# Patient Record
Sex: Female | Born: 1950 | Race: White | Hispanic: No | Marital: Married | Smoking: Never smoker
Health system: Southern US, Community
[De-identification: ages and names within clinical notes are randomized; demographics above are authoritative.]

## PROBLEM LIST (undated history)

## (undated) ENCOUNTER — Emergency Department (HOSPITAL_BASED_OUTPATIENT_CLINIC_OR_DEPARTMENT_OTHER): Admission: EM | Payer: PPO

## (undated) DIAGNOSIS — M1990 Unspecified osteoarthritis, unspecified site: Secondary | ICD-10-CM

## (undated) DIAGNOSIS — J45909 Unspecified asthma, uncomplicated: Secondary | ICD-10-CM

## (undated) DIAGNOSIS — E538 Deficiency of other specified B group vitamins: Secondary | ICD-10-CM

## (undated) DIAGNOSIS — I10 Essential (primary) hypertension: Secondary | ICD-10-CM

## (undated) DIAGNOSIS — K5730 Diverticulosis of large intestine without perforation or abscess without bleeding: Secondary | ICD-10-CM

## (undated) DIAGNOSIS — E785 Hyperlipidemia, unspecified: Secondary | ICD-10-CM

## (undated) DIAGNOSIS — E039 Hypothyroidism, unspecified: Secondary | ICD-10-CM

## (undated) HISTORY — PX: ABDOMINAL HYSTERECTOMY: SHX81

## (undated) HISTORY — DX: Deficiency of other specified B group vitamins: E53.8

## (undated) HISTORY — DX: Diverticulosis of large intestine without perforation or abscess without bleeding: K57.30

## (undated) HISTORY — DX: Unspecified osteoarthritis, unspecified site: M19.90

## (undated) HISTORY — DX: Unspecified asthma, uncomplicated: J45.909

## (undated) HISTORY — PX: OTHER SURGICAL HISTORY: SHX169

## (undated) HISTORY — PX: CARPAL TUNNEL RELEASE: SHX101

## (undated) HISTORY — DX: Hypothyroidism, unspecified: E03.9

## (undated) HISTORY — PX: BREAST SURGERY: SHX581

## (undated) HISTORY — DX: Hyperlipidemia, unspecified: E78.5

## (undated) HISTORY — PX: CERVICAL DISC ARTHROPLASTY: SHX587

## (undated) HISTORY — PX: TONSILLECTOMY: SUR1361

## (undated) HISTORY — DX: Essential (primary) hypertension: I10

## (undated) HISTORY — PX: REDUCTION MAMMAPLASTY: SUR839

---

## 1999-11-24 ENCOUNTER — Ambulatory Visit (HOSPITAL_COMMUNITY): Admission: RE | Admit: 1999-11-24 | Discharge: 1999-11-24 | Payer: Self-pay | Admitting: Gastroenterology

## 1999-12-15 ENCOUNTER — Encounter (INDEPENDENT_AMBULATORY_CARE_PROVIDER_SITE_OTHER): Payer: Self-pay | Admitting: Specialist

## 1999-12-15 ENCOUNTER — Ambulatory Visit (HOSPITAL_COMMUNITY): Admission: RE | Admit: 1999-12-15 | Discharge: 1999-12-15 | Payer: Self-pay | Admitting: *Deleted

## 2000-03-13 ENCOUNTER — Encounter: Admission: RE | Admit: 2000-03-13 | Discharge: 2000-03-13 | Payer: Self-pay | Admitting: Internal Medicine

## 2000-03-13 ENCOUNTER — Encounter: Payer: Self-pay | Admitting: Internal Medicine

## 2001-04-23 ENCOUNTER — Ambulatory Visit (HOSPITAL_COMMUNITY): Admission: RE | Admit: 2001-04-23 | Discharge: 2001-04-23 | Payer: Self-pay | Admitting: Orthopedic Surgery

## 2001-04-23 ENCOUNTER — Encounter: Payer: Self-pay | Admitting: Orthopedic Surgery

## 2001-07-30 ENCOUNTER — Encounter: Payer: Self-pay | Admitting: Internal Medicine

## 2001-07-30 ENCOUNTER — Encounter: Admission: RE | Admit: 2001-07-30 | Discharge: 2001-07-30 | Payer: Self-pay | Admitting: Internal Medicine

## 2003-09-26 ENCOUNTER — Ambulatory Visit (HOSPITAL_COMMUNITY): Admission: RE | Admit: 2003-09-26 | Discharge: 2003-09-26 | Payer: Self-pay | Admitting: Family Medicine

## 2004-12-06 ENCOUNTER — Ambulatory Visit: Payer: Self-pay | Admitting: Family Medicine

## 2004-12-14 ENCOUNTER — Ambulatory Visit: Payer: Self-pay | Admitting: Family Medicine

## 2004-12-20 ENCOUNTER — Ambulatory Visit: Payer: Self-pay | Admitting: Family Medicine

## 2005-01-03 ENCOUNTER — Ambulatory Visit: Payer: Self-pay | Admitting: Family Medicine

## 2005-03-28 ENCOUNTER — Ambulatory Visit: Payer: Self-pay | Admitting: Family Medicine

## 2005-04-25 ENCOUNTER — Ambulatory Visit: Payer: Self-pay | Admitting: Family Medicine

## 2005-04-29 ENCOUNTER — Encounter: Admission: RE | Admit: 2005-04-29 | Discharge: 2005-04-29 | Payer: Self-pay | Admitting: Family Medicine

## 2005-05-19 ENCOUNTER — Inpatient Hospital Stay (HOSPITAL_COMMUNITY): Admission: RE | Admit: 2005-05-19 | Discharge: 2005-05-20 | Payer: Self-pay | Admitting: Neurosurgery

## 2005-11-09 ENCOUNTER — Ambulatory Visit (HOSPITAL_COMMUNITY): Admission: RE | Admit: 2005-11-09 | Discharge: 2005-11-09 | Payer: Self-pay | Admitting: Family Medicine

## 2005-12-13 ENCOUNTER — Encounter: Admission: RE | Admit: 2005-12-13 | Discharge: 2006-01-26 | Payer: Self-pay | Admitting: Neurosurgery

## 2005-12-26 ENCOUNTER — Ambulatory Visit: Payer: Self-pay | Admitting: Family Medicine

## 2005-12-30 ENCOUNTER — Ambulatory Visit: Payer: Self-pay | Admitting: Family Medicine

## 2006-01-06 ENCOUNTER — Ambulatory Visit: Payer: Self-pay | Admitting: Internal Medicine

## 2006-01-26 ENCOUNTER — Ambulatory Visit: Payer: Self-pay | Admitting: Family Medicine

## 2006-01-31 ENCOUNTER — Ambulatory Visit: Payer: Self-pay | Admitting: Internal Medicine

## 2006-02-03 ENCOUNTER — Ambulatory Visit: Payer: Self-pay | Admitting: Family Medicine

## 2006-02-03 ENCOUNTER — Encounter: Admission: RE | Admit: 2006-02-03 | Discharge: 2006-02-03 | Payer: Self-pay | Admitting: Family Medicine

## 2006-02-21 ENCOUNTER — Ambulatory Visit: Payer: Self-pay | Admitting: Internal Medicine

## 2006-04-27 ENCOUNTER — Ambulatory Visit: Payer: Self-pay | Admitting: Family Medicine

## 2006-07-19 ENCOUNTER — Ambulatory Visit (HOSPITAL_COMMUNITY): Admission: RE | Admit: 2006-07-19 | Discharge: 2006-07-20 | Payer: Self-pay | Admitting: Orthopedic Surgery

## 2007-03-08 ENCOUNTER — Telehealth (INDEPENDENT_AMBULATORY_CARE_PROVIDER_SITE_OTHER): Payer: Self-pay | Admitting: *Deleted

## 2007-03-13 ENCOUNTER — Ambulatory Visit (HOSPITAL_COMMUNITY): Admission: RE | Admit: 2007-03-13 | Discharge: 2007-03-13 | Payer: Self-pay | Admitting: Family Medicine

## 2007-04-20 ENCOUNTER — Ambulatory Visit: Payer: Self-pay | Admitting: Family Medicine

## 2007-04-20 LAB — CONVERTED CEMR LAB
ALT: 20 units/L (ref 0–35)
AST: 19 units/L (ref 0–37)
Albumin: 4.1 g/dL (ref 3.5–5.2)
Alkaline Phosphatase: 136 units/L — ABNORMAL HIGH (ref 39–117)
BUN: 18 mg/dL (ref 6–23)
Basophils Absolute: 0 10*3/uL (ref 0.0–0.1)
Basophils Relative: 0.3 % (ref 0.0–1.0)
Bilirubin Urine: NEGATIVE
Bilirubin, Direct: 0.1 mg/dL (ref 0.0–0.3)
Blood in Urine, dipstick: NEGATIVE
CO2: 31 meq/L (ref 19–32)
Calcium: 9.6 mg/dL (ref 8.4–10.5)
Chloride: 105 meq/L (ref 96–112)
Cholesterol: 232 mg/dL (ref 0–200)
Creatinine, Ser: 0.7 mg/dL (ref 0.4–1.2)
Direct LDL: 144 mg/dL
Eosinophils Absolute: 0.1 10*3/uL (ref 0.0–0.6)
Eosinophils Relative: 2 % (ref 0.0–5.0)
GFR calc Af Amer: 111 mL/min
GFR calc non Af Amer: 91 mL/min
Glucose, Bld: 94 mg/dL (ref 70–99)
Glucose, Urine, Semiquant: NEGATIVE
HCT: 37.9 % (ref 36.0–46.0)
HDL: 66 mg/dL (ref >39.0)
Hemoglobin: 12.7 g/dL (ref 12.0–15.0)
Lymphocytes Relative: 38.3 % (ref 12.0–46.0)
MCHC: 33.6 g/dL (ref 30.0–36.0)
MCV: 85.3 fL (ref 78.0–100.0)
Monocytes Absolute: 0.4 10*3/uL (ref 0.2–0.7)
Monocytes Relative: 6.5 % (ref 3.0–11.0)
Neutro Abs: 2.9 10*3/uL (ref 1.4–7.7)
Neutrophils Relative %: 52.9 % (ref 43.0–77.0)
Nitrite: NEGATIVE
Platelets: 294 10*3/uL (ref 150–400)
Potassium: 4.2 meq/L (ref 3.5–5.1)
Protein, U semiquant: NEGATIVE
RBC: 4.44 M/uL (ref 3.87–5.11)
RDW: 12.5 % (ref 11.5–14.6)
Sodium: 142 meq/L (ref 135–145)
Specific Gravity, Urine: >=1.03
TSH: 0.12 microintl units/mL — ABNORMAL LOW (ref 0.35–5.50)
Total Bilirubin: 0.7 mg/dL (ref 0.3–1.2)
Total CHOL/HDL Ratio: 3.5
Total Protein: 6.7 g/dL (ref 6.0–8.3)
Triglycerides: 171 mg/dL — ABNORMAL HIGH (ref 0–149)
Urobilinogen, UA: 0.2
VLDL: 34 mg/dL (ref 0–40)
WBC Urine, dipstick: NEGATIVE
WBC: 5.5 10*3/uL (ref 4.5–10.5)
pH: 5

## 2007-05-09 ENCOUNTER — Ambulatory Visit: Payer: Self-pay | Admitting: Family Medicine

## 2007-05-09 DIAGNOSIS — F988 Other specified behavioral and emotional disorders with onset usually occurring in childhood and adolescence: Secondary | ICD-10-CM | POA: Insufficient documentation

## 2007-05-09 DIAGNOSIS — I872 Venous insufficiency (chronic) (peripheral): Secondary | ICD-10-CM | POA: Insufficient documentation

## 2007-05-09 DIAGNOSIS — E039 Hypothyroidism, unspecified: Secondary | ICD-10-CM | POA: Insufficient documentation

## 2007-05-09 DIAGNOSIS — F909 Attention-deficit hyperactivity disorder, unspecified type: Secondary | ICD-10-CM | POA: Insufficient documentation

## 2007-05-09 HISTORY — DX: Attention-deficit hyperactivity disorder, unspecified type: F90.9

## 2007-05-10 ENCOUNTER — Telehealth (INDEPENDENT_AMBULATORY_CARE_PROVIDER_SITE_OTHER): Payer: Self-pay | Admitting: *Deleted

## 2007-05-22 ENCOUNTER — Ambulatory Visit: Payer: Self-pay | Admitting: Family Medicine

## 2007-06-15 ENCOUNTER — Encounter: Payer: Self-pay | Admitting: Family Medicine

## 2007-07-23 ENCOUNTER — Ambulatory Visit: Payer: Self-pay | Admitting: Family Medicine

## 2007-07-23 LAB — CONVERTED CEMR LAB: TSH: 8.12 microintl units/mL — ABNORMAL HIGH (ref 0.35–5.50)

## 2007-07-26 ENCOUNTER — Telehealth: Payer: Self-pay | Admitting: Family Medicine

## 2007-08-15 ENCOUNTER — Telehealth: Payer: Self-pay | Admitting: Family Medicine

## 2007-11-01 ENCOUNTER — Telehealth (INDEPENDENT_AMBULATORY_CARE_PROVIDER_SITE_OTHER): Payer: Self-pay | Admitting: *Deleted

## 2007-11-16 ENCOUNTER — Telehealth: Payer: Self-pay | Admitting: Family Medicine

## 2008-01-09 ENCOUNTER — Telehealth: Payer: Self-pay | Admitting: Family Medicine

## 2008-04-01 ENCOUNTER — Telehealth: Payer: Self-pay | Admitting: *Deleted

## 2008-04-22 ENCOUNTER — Ambulatory Visit: Payer: Self-pay | Admitting: Family Medicine

## 2008-07-21 ENCOUNTER — Telehealth: Payer: Self-pay | Admitting: *Deleted

## 2008-08-11 ENCOUNTER — Ambulatory Visit: Payer: Self-pay | Admitting: Family Medicine

## 2008-08-11 LAB — CONVERTED CEMR LAB
ALT: 21 units/L (ref 0–35)
AST: 23 units/L (ref 0–37)
Albumin: 4 g/dL (ref 3.5–5.2)
Alkaline Phosphatase: 121 units/L — ABNORMAL HIGH (ref 39–117)
BUN: 21 mg/dL (ref 6–23)
Basophils Absolute: 0 10*3/uL (ref 0.0–0.1)
Basophils Relative: 0 % (ref 0.0–3.0)
Bilirubin Urine: NEGATIVE
Bilirubin, Direct: 0.1 mg/dL (ref 0.0–0.3)
Blood in Urine, dipstick: NEGATIVE
CO2: 31 meq/L (ref 19–32)
Calcium: 9.6 mg/dL (ref 8.4–10.5)
Chloride: 107 meq/L (ref 96–112)
Cholesterol: 247 mg/dL (ref 0–200)
Creatinine, Ser: 0.7 mg/dL (ref 0.4–1.2)
Direct LDL: 168.6 mg/dL
Eosinophils Absolute: 0.1 10*3/uL (ref 0.0–0.7)
Eosinophils Relative: 2.4 % (ref 0.0–5.0)
GFR calc Af Amer: 110 mL/min
GFR calc non Af Amer: 91 mL/min
Glucose, Bld: 101 mg/dL — ABNORMAL HIGH (ref 70–99)
Glucose, Urine, Semiquant: NEGATIVE
HCT: 39.3 % (ref 36.0–46.0)
HDL: 55.6 mg/dL (ref >39.0)
Hemoglobin: 13.5 g/dL (ref 12.0–15.0)
Ketones, urine, test strip: NEGATIVE
Lymphocytes Relative: 36.2 % (ref 12.0–46.0)
MCHC: 34.3 g/dL (ref 30.0–36.0)
MCV: 80.5 fL (ref 78.0–100.0)
Monocytes Absolute: 0.3 10*3/uL (ref 0.1–1.0)
Monocytes Relative: 6.1 % (ref 3.0–12.0)
Neutro Abs: 2.4 10*3/uL (ref 1.4–7.7)
Neutrophils Relative %: 55.3 % (ref 43.0–77.0)
Nitrite: NEGATIVE
Platelets: 253 10*3/uL (ref 150–400)
Potassium: 4.5 meq/L (ref 3.5–5.1)
RBC: 4.89 M/uL (ref 3.87–5.11)
RDW: 13.9 % (ref 11.5–14.6)
Sodium: 143 meq/L (ref 135–145)
Specific Gravity, Urine: 1.02
TSH: 0.81 microintl units/mL (ref 0.35–5.50)
Total Bilirubin: 0.9 mg/dL (ref 0.3–1.2)
Total CHOL/HDL Ratio: 4.4
Total Protein: 7.2 g/dL (ref 6.0–8.3)
Triglycerides: 147 mg/dL (ref 0–149)
Urobilinogen, UA: 0.2
VLDL: 29 mg/dL (ref 0–40)
WBC Urine, dipstick: NEGATIVE
WBC: 4.4 10*3/uL — ABNORMAL LOW (ref 4.5–10.5)
pH: 5.5

## 2008-08-18 ENCOUNTER — Ambulatory Visit: Payer: Self-pay | Admitting: Family Medicine

## 2008-08-18 ENCOUNTER — Other Ambulatory Visit: Admission: RE | Admit: 2008-08-18 | Discharge: 2008-08-18 | Payer: Self-pay | Admitting: Family Medicine

## 2008-08-18 ENCOUNTER — Encounter: Payer: Self-pay | Admitting: Family Medicine

## 2008-08-18 DIAGNOSIS — N952 Postmenopausal atrophic vaginitis: Secondary | ICD-10-CM | POA: Insufficient documentation

## 2008-08-18 HISTORY — DX: Postmenopausal atrophic vaginitis: N95.2

## 2008-10-22 ENCOUNTER — Telehealth: Payer: Self-pay | Admitting: Family Medicine

## 2008-10-29 ENCOUNTER — Telehealth: Payer: Self-pay | Admitting: Family Medicine

## 2009-02-09 ENCOUNTER — Telehealth: Payer: Self-pay | Admitting: Family Medicine

## 2009-05-11 ENCOUNTER — Telehealth: Payer: Self-pay | Admitting: *Deleted

## 2009-08-21 ENCOUNTER — Telehealth: Payer: Self-pay | Admitting: Family Medicine

## 2009-10-26 ENCOUNTER — Telehealth: Payer: Self-pay | Admitting: Family Medicine

## 2009-11-30 ENCOUNTER — Telehealth: Payer: Self-pay | Admitting: Family Medicine

## 2009-12-29 ENCOUNTER — Ambulatory Visit: Payer: Self-pay | Admitting: Family Medicine

## 2009-12-29 LAB — CONVERTED CEMR LAB
ALT: 23 units/L (ref 0–35)
AST: 24 units/L (ref 0–37)
Albumin: 4.3 g/dL (ref 3.5–5.2)
Alkaline Phosphatase: 96 units/L (ref 39–117)
BUN: 23 mg/dL (ref 6–23)
Basophils Absolute: 0 10*3/uL (ref 0.0–0.1)
Basophils Relative: 0.7 % (ref 0.0–3.0)
Bilirubin Urine: NEGATIVE
Bilirubin, Direct: 0.1 mg/dL (ref 0.0–0.3)
Blood in Urine, dipstick: NEGATIVE
CO2: 32 meq/L (ref 19–32)
Calcium: 9.6 mg/dL (ref 8.4–10.5)
Chloride: 103 meq/L (ref 96–112)
Cholesterol: 263 mg/dL — ABNORMAL HIGH (ref 0–200)
Creatinine, Ser: 0.7 mg/dL (ref 0.4–1.2)
Direct LDL: 174.2 mg/dL
Eosinophils Absolute: 0.1 10*3/uL (ref 0.0–0.7)
Eosinophils Relative: 1.4 % (ref 0.0–5.0)
GFR calc non Af Amer: 90.35 mL/min (ref >60)
Glucose, Bld: 104 mg/dL — ABNORMAL HIGH (ref 70–99)
Glucose, Urine, Semiquant: NEGATIVE
HCT: 40.4 % (ref 36.0–46.0)
HDL: 68.9 mg/dL (ref >39.00)
Hemoglobin: 14 g/dL (ref 12.0–15.0)
Ketones, urine, test strip: NEGATIVE
Lymphocytes Relative: 29 % (ref 12.0–46.0)
Lymphs Abs: 1.9 10*3/uL (ref 0.7–4.0)
MCHC: 34.7 g/dL (ref 30.0–36.0)
MCV: 87.4 fL (ref 78.0–100.0)
Monocytes Absolute: 0.3 10*3/uL (ref 0.1–1.0)
Monocytes Relative: 4.2 % (ref 3.0–12.0)
Neutro Abs: 4.2 10*3/uL (ref 1.4–7.7)
Neutrophils Relative %: 64.7 % (ref 43.0–77.0)
Nitrite: NEGATIVE
Platelets: 212 10*3/uL (ref 150.0–400.0)
Potassium: 4.9 meq/L (ref 3.5–5.1)
Protein, U semiquant: NEGATIVE
RBC: 4.63 M/uL (ref 3.87–5.11)
RDW: 14.3 % (ref 11.5–14.6)
Sodium: 143 meq/L (ref 135–145)
Specific Gravity, Urine: 1.025
TSH: 0.63 microintl units/mL (ref 0.35–5.50)
Total Bilirubin: 0.6 mg/dL (ref 0.3–1.2)
Total CHOL/HDL Ratio: 4
Total Protein: 7 g/dL (ref 6.0–8.3)
Triglycerides: 219 mg/dL — ABNORMAL HIGH (ref 0.0–149.0)
Urobilinogen, UA: 0.2
VLDL: 43.8 mg/dL — ABNORMAL HIGH (ref 0.0–40.0)
WBC Urine, dipstick: NEGATIVE
WBC: 6.6 10*3/uL (ref 4.5–10.5)
pH: 6

## 2010-01-07 ENCOUNTER — Ambulatory Visit: Payer: Self-pay | Admitting: Family Medicine

## 2010-02-04 ENCOUNTER — Ambulatory Visit (HOSPITAL_COMMUNITY): Admission: RE | Admit: 2010-02-04 | Discharge: 2010-02-04 | Payer: Self-pay | Admitting: Family Medicine

## 2010-02-05 ENCOUNTER — Ambulatory Visit: Payer: Self-pay | Admitting: Family Medicine

## 2010-02-05 DIAGNOSIS — I10 Essential (primary) hypertension: Secondary | ICD-10-CM

## 2010-02-09 ENCOUNTER — Encounter: Payer: Self-pay | Admitting: Family Medicine

## 2010-02-26 ENCOUNTER — Telehealth: Payer: Self-pay | Admitting: Family Medicine

## 2010-05-03 ENCOUNTER — Telehealth: Payer: Self-pay | Admitting: Family Medicine

## 2010-05-05 ENCOUNTER — Telehealth: Payer: Self-pay | Admitting: Family Medicine

## 2010-06-07 ENCOUNTER — Telehealth: Payer: Self-pay | Admitting: Family Medicine

## 2010-07-26 ENCOUNTER — Ambulatory Visit
Admission: RE | Admit: 2010-07-26 | Discharge: 2010-07-26 | Payer: Self-pay | Source: Home / Self Care | Attending: Family Medicine | Admitting: Family Medicine

## 2010-07-26 DIAGNOSIS — M5030 Other cervical disc degeneration, unspecified cervical region: Secondary | ICD-10-CM | POA: Insufficient documentation

## 2010-07-28 ENCOUNTER — Telehealth: Payer: Self-pay | Admitting: *Deleted

## 2010-07-29 ENCOUNTER — Telehealth: Payer: Self-pay | Admitting: Family Medicine

## 2010-08-17 NOTE — Progress Notes (Signed)
Summary: Rx Refill  Phone Note Refill Request Call back at [phone] Message from:  Patient on June 07, 2010 8:28 AM  Refills Requested: Medication #1:  ADDERALL 20 MG TABS take one tab by mouth once daily Pt states she needs urgently before [facility]   Method Requested: Pick up at Office Initial call taken by: Trixie Dredge,  June 07, 2010 8:28 AM    Prescriptions: AMPHETAMINE SALT COMBO 20 MG TABS (AMPHETAMINE-DEXTROAMPHETAMINE) take one tablet once daily   fill in two months  #30 x 0   Entered by:   Kern Reap CMA (AAMA)   Authorized by:   Roderick Pee MD   Signed by:   Kern Reap CMA (AAMA) on 06/07/2010   Method used:   Print then Give to Patient   RxID:   [id number] AMPHETAMINE SALT COMBO 20 MG TABS (AMPHETAMINE-DEXTROAMPHETAMINE) take one tablet once daily   fill in one month  #30 x 0   Entered by:   Kern Reap CMA (AAMA)   Authorized by:   Roderick Pee MD   Signed by:   Kern Reap CMA (AAMA) on 06/07/2010   Method used:   Print then Give to Patient   RxID:   [id number] AMPHETAMINE SALT COMBO 20 MG TABS (AMPHETAMINE-DEXTROAMPHETAMINE) take one table once daily  #30 x 0   Entered by:   Kern Reap CMA (AAMA)   Authorized by:   Roderick Pee MD   Signed by:   Kern Reap CMA (AAMA) on 06/07/2010   Method used:   Print then Give to Patient   RxID:   [id number]

## 2010-08-17 NOTE — Progress Notes (Signed)
Summary: REQUEST FOR RX  Phone Note Call from Patient   Caller: Patient   Summary of Call: Pt would like a Rx for med:  Premarin.... Pt adv that her sister was put on med by Dr Tawanna Cooler because of sxs of menopause (hot flashes) and since she has been exp the same thing, she would like to have Rx  -  PLEASE !......... [facility] [address] [place].  Initial call taken by: Debbra Riding,  June 07, 2010 2:09 PM  Follow-up for Phone Call        Premarin .3, dispense 100 tablets....... directions one p.o. nightly, no refills.  Follow-up office visit in two months.  Ladona Ridgel, also, she has to take an 81 mg, baby aspirin daily because of the side effects of potential blood clots from hormone Follow-up by: Roderick Pee MD,  June 07, 2010 2:33 PM  Additional Follow-up for Phone Call Additional follow up Details #1::        Phone Call Completed Additional Follow-up by: Kern Reap CMA Duncan Dull),  June 07, 2010 4:14 PM    New/Updated Medications: PREMARIN 0.3 MG TABS (ESTROGENS CONJUGATED) one tab by mouth at bedtime Prescriptions: PREMARIN 0.3 MG TABS (ESTROGENS CONJUGATED) one tab by mouth at bedtime  #100 x 0   Entered by:   Kern Reap CMA (AAMA)   Authorized by:   Roderick Pee MD   Signed by:   Kern Reap CMA (AAMA) on 06/07/2010   Method used:   Electronically to        [facility] [phone]* (retail)       [address]       [place], [place]  [address]       Ph: [phone]       Fax: [phone]   RxID:   [id number]

## 2010-08-17 NOTE — Progress Notes (Signed)
Summary: [facility]der[facility] is on [facility] order. Req to use 12.5mg    Phone Note From Pharm[facility] at [facility] [phone] - Marcelino Duster   [facility] Summary of Call: [facility] and said that [facility] Adderall is on back order til end of Nov. Pharmacy has some 12.5mg  generic and is wondering if it would be ok to give pt 1 and 1/2 daily, which is 18.75 mg, for this month. Pls call.  Initial call taken by: Lucy Antigua,  May 05, 2010 11:36 AM  Follow-up for Phone Call        ok Follow-up by: Roderick Pee MD,  May 06, 2010 8:15 AM  Additional Follow-up for Phone Call Additional follow up Details #1::        rx for 12.5 discarded. only the 7.5 available.  patient is aware. Additional Follow-up by: Kern Reap CMA Duncan Dull),  May 06, 2010 10:38 AM    New/Updated Medications: ADDERALL 12.5 MG TABS (AMPHETAMINE-DEXTROAMPHETAMINE) take one and half tab by mouth once daily Prescriptions: ADDERALL 12.5 MG TABS (AMPHETAMINE-DEXTROAMPHETAMINE) take one and half tab by mouth once daily  #45 x 0   Entered by:   Kern Reap CMA (AAMA)   Authorized by:   Roderick Pee MD   Signed by:   Kern Reap CMA (AAMA) on 05/06/2010   Method used:   Print then Give to Patient   RxID:   [id number]

## 2010-08-17 NOTE — Progress Notes (Signed)
Summary: REQ FOR REFILL (Amphetamine Salt Combo - Adderall)  Phone Note Refill Request Message from:  Patient on Nov 30, 2009 4:24 PM  Refills Requested: Medication #1:  AMPHETAMINE SALT COMBO 20 MG TABS take one table once daily   Notes: Pt adv that she can be reached at home # [phone] or cell # [phone] when Rx is ready for p/u.    Initial call taken by: Debbra Riding,  Nov 30, 2009 4:24 PM  Follow-up for Phone Call        ok Follow-up by: Roderick Pee MD,  Nov 30, 2009 4:39 PM  Additional Follow-up for Phone Call Additional follow up Details #1::        rx ready for pick up Additional Follow-up by: Kern Reap CMA Duncan Dull),  Dec 01, 2009 10:13 AM    Prescriptions: AMPHETAMINE SALT COMBO 20 MG TABS (AMPHETAMINE-DEXTROAMPHETAMINE) take one tablet once daily   fill in two months  #30 x 0   Entered by:   Kern Reap CMA (AAMA)   Authorized by:   Roderick Pee MD   Signed by:   Kern Reap CMA (AAMA) on 11/30/2009   Method used:   Print then Give to Patient   RxID:   [id number] AMPHETAMINE SALT COMBO 20 MG TABS (AMPHETAMINE-DEXTROAMPHETAMINE) take one tablet once daily   fill in one month  #30 x 0   Entered by:   Kern Reap CMA (AAMA)   Authorized by:   Roderick Pee MD   Signed by:   Kern Reap CMA (AAMA) on 11/30/2009   Method used:   Print then Give to Patient   RxID:   [id number] AMPHETAMINE SALT COMBO 20 MG TABS (AMPHETAMINE-DEXTROAMPHETAMINE) take one table once daily  #30 x 0   Entered by:   Kern Reap CMA (AAMA)   Authorized by:   Roderick Pee MD   Signed by:   Kern Reap CMA (AAMA) on 11/30/2009   Method used:   Print then Give to Patient   RxID:   [id number] AMPHETAMINE SALT COMBO 20 MG TABS (AMPHETAMINE-DEXTROAMPHETAMINE) take one tablet once daily   fill in two months  #30 x 0   Entered by:   Kern Reap CMA (AAMA)   Authorized by:   Roderick Pee MD   Signed by:   Kern Reap CMA (AAMA) on 11/30/2009  Method used:   Electronically to        [facility]. [phone]* (retail)       [address]       [place], [place]  [address]       Ph: [phone]       Fax: [phone]   RxID:   [id number] AMPHETAMINE SALT COMBO 20 MG TABS (AMPHETAMINE-DEXTROAMPHETAMINE) take one tablet once daily   fill in one month  #30 x 0   Entered by:   Kern Reap CMA (AAMA)   Authorized by:   Roderick Pee MD   Signed by:   Kern Reap CMA (AAMA) on 11/30/2009   Method used:   Electronically to        [facility]. [phone]* (retail)       [address]       [place], [place]  [address]       Ph: [phone]       Fax: [phone]   RxID:  [id number] AMPHETAMINE SALT COMBO 20 MG TABS (AMPHETAMINE-DEXTROAMPHETAMINE) take one table once daily  #30 x 0   Entered by:   Kern Reap CMA (AAMA)   Authorized by:   Roderick Pee MD   Signed by:   Kern Reap CMA (AAMA) on 11/30/2009   Method used:   Electronically to        [facility]. [phone]* (retail)       [address]       [place], [place]  [address]       Ph: [phone]       Fax: [phone]   RxID:   [id number]

## 2010-08-17 NOTE — Progress Notes (Signed)
Summary: refill  Phone Note Refill Request Call back at Home Phone [phone] Message from:  Patient---live call  Refills Requested: Medication #1:  HYDROCHLOROTHIAZIDE 12.5 MG  CAPS 1 once daily [facility]  Initial call taken by: Warnell Forester,  October 26, 2009 2:00 PM    Prescriptions: HYDROCHLOROTHIAZIDE 12.5 MG  CAPS (HYDROCHLOROTHIAZIDE) 1 once daily  #100 x 4   Entered by:   Kern Reap CMA (AAMA)   Authorized by:   Roderick Pee MD   Signed by:   Kern Reap CMA (AAMA) on 10/26/2009   Method used:   Electronically to        [facility]. [phone]* (retail)       [address]       [place], [place]  [address]       Ph: [phone]       Fax: [phone]   RxID:   [id number]

## 2010-08-17 NOTE — Miscellaneous (Signed)
Summary: rx update  Clinical Lists Changes  Medications: Changed medication from SYNTHROID 100 MCG  TABS (LEVOTHYROXINE SODIUM) once daily to SYNTHROID 100 MCG  TABS (LEVOTHYROXINE SODIUM) once daily  Appended Document: Flu Vaccine update    Clinical Lists Changes  Observations: Added new observation of FLU VAX: Historical (04/19/2010 10:51)       Immunization History:  Influenza Immunization History:    Influenza:  historical (04/19/2010)

## 2010-08-17 NOTE — Assessment & Plan Note (Signed)
Summary: cpx---pap//ccm   Vital Signs:  Patient profile:   60 year old female Menstrual status:  hysterectomy Height:      59 inches Weight:      122 pounds BMI:     24.73 Temp:     98.5 degrees F oral BP sitting:   158 / 100  (left arm) Cuff size:   regular  Vitals Entered By: Kern Reap CMA Duncan Dull) (January 07, 2010 10:48 AM) CC: cpx Is Patient Diabetic? No     Menstrual Status hysterectomy Last PAP Result NEGATIVE FOR INTRAEPITHELIAL LESIONS OR MALIGNANCY.   CC:  cpx.  History of Present Illness: Gabrielle Vargas is a 60 year old female, nonsmoker, who comes in for general physical examination because of underlying hypertension, hypothyroidism, ADD, and postmenopausal vaginal dryness.  She takes hydrochlorothiazide 12.5 mg daily for hypertension.  BP today 158 over hundred.  She states her blood pressures at home are running 140/90.  However, sometimes she gets normal pressure.  She takes Synthroid 100 micrograms alternating with 125 micrograms TSH normal.  Continue that medication.  She takes Adderall 20 mg daily for ADD functioning well on medication.  She has a history of postmenopausal vaginal dryness.  However, she is not using the cream that we prescribed.  She takes an 81-mg baby aspirin, walks 30 minutes daily, however, she does not do BSE monthly.  Her last mammogram was two years ago.  She does get routine eye care and dental care.  Colonoscopy normal.  Tetanus 10-Dec-2004 seasonal flu 2008/12/10.  Her mother died of metastatic melanoma.  We will also do a complete skin exam.  She is also complaining of some soreness in her joints.  No redness nor swelling.  Allergies: 1)  ! * Latex 2)  Percocet  Past History:  Past medical, surgical, family and social histories (including risk factors) reviewed, and no changes noted (except as noted below).  Past Medical History: Reviewed history from 08/18/2008 and no changes required. childbirth x 2 [place] for  DUB ADD hypothyroidism venous insufficiency reduction mammoplasty...implanta ...imp. remioved  Past Surgical History: Reviewed history from 08/18/2008 and no changes required. Rotator cuff repair  R  Family History: Reviewed history from 08/18/2008 and no changes required. Family History of Alcoholism/Addiction Family History of CAD Female 1st degree relative <60 Family History Depression Family History Hypertension mother died of metastatic melanoma  Social History: Reviewed history from 05/09/2007 and no changes required. 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 No apparent alopecia or balding. Eyes:  No corneal or conjunctival inflammation noted. EOMI. Perrla. Funduscopic exam benign, without hemorrhages, exudates or papilledema. Vision grossly normal. Ears:  External ear exam shows no significant lesions or deformities.  Otoscopic examination reveals clear canals, tympanic membranes are intact bilaterally without bulging, retraction, inflammation or discharge. Hearing is grossly normal bilaterally. Nose:  External nasal examination shows no deformity or inflammation. Nasal mucosa are pink and moist without lesions or exudates. Mouth:  Oral mucosa and oropharynx without lesions or exudates.  Teeth in good repair. Neck:  No deformities, masses, or tenderness noted. Chest Wall:  No deformities, masses, or tenderness noted. Breasts:  scars on her breasts.  She had any implants and then the implants removed and  then she got them tightened up.  No palpable masses Lungs:  Normal respiratory effort, chest expands symmetrically. Lungs are  clear to auscultation, no crackles or wheezes. Heart:  Normal rate and regular rhythm. S1 and S2 normal without gallop, murmur, click, rub or other extra sounds. Abdomen:  Bowel sounds positive,abdomen soft and non-tender without masses, organomegaly or hernias noted. Rectal:  No external abnormalities noted. Normal sphincter tone. No rectal masses or tenderness. Genitalia:  Pelvic Exam:        External: normal female genitalia without lesions or masses        Vagina: normal without lesions or masses        Cervix: normal without lesions or masses        Adnexa: normal bimanual exam without masses or fullness        Uterus: normal by palpation        Pap smear: not performed Msk:  No deformity or scoliosis noted of thoracic or lumbar spine.   Pulses:  R and L carotid,radial,femoral,dorsalis pedis and posterior tibial pulses are full and equal bilaterally Extremities:  No clubbing, cyanosis, edema, or deformity noted with normal full range of motion of all joints.   Neurologic:  No cranial nerve deficits noted. Station and gait are normal. Plantar reflexes are down-going bilaterally. DTRs are symmetrical throughout. Sensory, motor and coordinative functions appear intact. Skin:  Intact without suspicious lesions or rashes Cervical Nodes:  No lymphadenopathy noted Axillary Nodes:  No palpable lymphadenopathy Inguinal Nodes:  No significant adenopathy Psych:  Cognition and judgment appear intact. Alert and cooperative with normal attention span and concentration. No apparent delusions, illusions, hallucinations   Impression & Recommendations:  Problem # 1:  Preventive Health Care (ICD-V70.0) Assessment Unchanged  Problem # 2:  VAGINITIS, ATROPHIC, SEVERE (ICD-627.3) Assessment: Deteriorated  Her updated medication list for this problem includes:    Premarin 0.625 Mg/gm Crea (Estrogens, conjugated) .Marland Kitchen... Apply weekly  Orders: Prescription Created Electronically [phone])  Problem # 3:   ADD (ICD-314.00) Assessment: Improved  Orders: Prescription Created Electronically [phone])  Problem # 4:  VENOUS INSUFFICIENCY, MILD (ICD-459.81) Assessment: Improved  Orders: Prescription Created Electronically [phone])  Problem # 5:  ELEVATED BLOOD PRESSURE WITHOUT DIAGNOSIS OF HYPERTENSION (ICD-796.2) Assessment: New  Her updated medication list for this problem includes:    Hydrochlorothiazide 12.5 Mg Caps (Hydrochlorothiazide) .Marland Kitchen... 1 once daily  Orders: Prescription Created Electronically [phone]) EKG w/ Interpretation ([address])  Complete Medication List: 1)  Hydrochlorothiazide 12.5 Mg Caps (Hydrochlorothiazide) .Marland Kitchen.. 1 once daily 2)  Synthroid 100 Mcg Tabs (Levothyroxine sodium) .... Once daily 3)  Synthroid 125 Mcg Tabs (Levothyroxine sodium) .... Once daily 4)  Amphetamine Salt Combo 20 Mg Tabs (Amphetamine-dextroamphetamine) .... Take one table once daily 5)  Amphetamine Salt Combo 20 Mg Tabs (Amphetamine-dextroamphetamine) .... Take one tablet once daily   fill in one month 6)  Amphetamine Salt Combo 20 Mg Tabs (Amphetamine-dextroamphetamine) .... Take one tablet once daily   fill in two months 7)  Adult Aspirin Ec Low Strength 81 Mg Tbec (Aspirin) .... Once daily 8)  Premarin 0.625 Mg/gm Crea (Estrogens, conjugated) .... Apply weekly  Patient Instructions: 1)  check your blood pressure daily in the morning.  Return in 3 weeks for follow-up.  When u  returned during a record of all your blood pressure readings and the device 2)  Schedule your mammogram./BSE monthly 3)  Take calcium +Vitamin D daily. 4)  Take an Aspirin every day...Marland KitchenMarland Kitchen81 mg  5)  use the vaginal cream  twice weekly Prescriptions: PREMARIN 0.625 MG/GM CREA (ESTROGENS, CONJUGATED) apply weekly  #3 tubes x 6   Entered and Authorized by:   Roderick Pee MD   Signed by:   Roderick Pee MD on 01/07/2010   Method used:   Electronically to        [facility] [phone]* (retail)       [address]       [place], [place]  [address]       Ph: [phone]       Fax: [phone]   RxID:   [id number] SYNTHROID 125 MCG  TABS (LEVOTHYROXINE SODIUM) once daily  #100 x 4   Entered and Authorized by:   Roderick Pee MD   Signed by:   Roderick Pee MD on 01/07/2010   Method used:   Electronically to        [facility]. [phone]* (retail)       [address]       [place], [place]  [address]       Ph: [phone]       Fax: [phone]   RxID:   [id number] SYNTHROID 100 MCG  TABS (LEVOTHYROXINE SODIUM) once daily  #100 x 4   Entered and Authorized by:   Roderick Pee MD   Signed by:   Roderick Pee MD on 01/07/2010   Method used:   Electronically to        [facility]. [phone]* (retail)       [address]       [place], [place]  [address]       Ph: [phone]       Fax: [phone]   RxID:   [id number] HYDROCHLOROTHIAZIDE 12.5 MG  CAPS (HYDROCHLOROTHIAZIDE) 1 once daily  #100 x 4   Entered and Authorized by:   Roderick Pee MD   Signed by:   Roderick Pee MD on 01/07/2010   Method used:   Electronically to        [facility]. [phone]* (retail)       [address]       [place], [place]  [address]       Ph: [phone]       Fax: [phone]   RxID:   [id number]    Immunization History:  Influenza Immunization History:    Influenza:  historical (04/17/2009)

## 2010-08-17 NOTE — Progress Notes (Signed)
Summary: RX FOR GENERIC ADDERALL   Phone Note Refill Request Call back at [phone] Message from:  Patient on February 26, 2010 1:30 PM  Refills Requested: Medication #1:  AMPHETAMINE SALT COMBO 20 MG TABS take one table once daily   Dosage confirmed as above?Dosage Confirmed  Medication #2:  AMPHETAMINE SALT COMBO 20 MG TABS take one tablet once daily   fill in one month   Dosage confirmed as above?Dosage Confirmed  Medication #3:  AMPHETAMINE SALT COMBO 20 MG TABS take one tablet once daily   fill in two months   Dosage confirmed as above?Dosage Confirmed Initial call taken by: Kathrynn Speed CMA,  February 26, 2010 1:31 PM Caller: Patient Call For: Roderick Pee MD Summary of Call: PT NEEDS NEW RX FOR GENERIC ADDERALL 20  MG  Last fill date was 11/30/09 per our system  Generic on Med list ok to print for you to sign?   Initial call taken by: Heron Sabins,  February 26, 2010 9:31 AM  Follow-up for Phone Call        0k x 64mo.  rx ready to pick up.  patient is aware Follow-up by: Roderick Pee MD,  February 26, 2010 10:51 AM    Prescriptions: AMPHETAMINE SALT COMBO 20 MG TABS (AMPHETAMINE-DEXTROAMPHETAMINE) take one tablet once daily   fill in two months  #30 x 0   Entered by:   Kern Reap CMA (AAMA)   Authorized by:   Roderick Pee MD   Signed by:   Kern Reap CMA (AAMA) on 02/26/2010   Method used:   Print then Give to Patient   RxID:   [id number] AMPHETAMINE SALT COMBO 20 MG TABS (AMPHETAMINE-DEXTROAMPHETAMINE) take one tablet once daily   fill in one month  #30 x 0   Entered by:   Kern Reap CMA (AAMA)   Authorized by:   Roderick Pee MD   Signed by:   Kern Reap CMA (AAMA) on 02/26/2010   Method used:   Print then Give to Patient   RxID:   [id number] AMPHETAMINE SALT COMBO 20 MG TABS (AMPHETAMINE-DEXTROAMPHETAMINE) take one table once daily  #30 x 0   Entered by:   Kern Reap CMA (AAMA)   Authorized by:   Roderick Pee MD   Signed by:    Kern Reap CMA (AAMA) on 02/26/2010   Method used:   Print then Give to Patient   RxID:   [id number]

## 2010-08-17 NOTE — Progress Notes (Signed)
Summary: Pt said generic adderall 20mg  not available. Pt req brand name  Phone Note Call from Patient Call back at [phone] cell   Summary of Call: Pt called and said that generic Adderall 20mg  is not available at any pharmacy until after the first of the yr. Pt is req brand name Adderall until then. Pls call and advise.   Initial call taken by: Lucy Antigua,  May 03, 2010 9:10 AM    New/Updated Medications: ADDERALL 20 MG TABS (AMPHETAMINE-DEXTROAMPHETAMINE) take one tab by mouth once daily Prescriptions: ADDERALL 20 MG TABS (AMPHETAMINE-DEXTROAMPHETAMINE) take one tab by mouth once daily  #30 x 0   Entered by:   Kern Reap CMA (AAMA)   Authorized by:   Roderick Pee MD   Signed by:   Kern Reap CMA (AAMA) on 05/03/2010   Method used:   Print then Give to Patient   RxID:   [id number]

## 2010-08-17 NOTE — Assessment & Plan Note (Signed)
Summary: 3 wk rov/njr/pt rescd//ccm   Vital Signs:  Patient profile:   60 year old female Menstrual status:  hysterectomy Temp:     98.4 degrees F oral BP sitting:   142 / 98  (left arm) Cuff size:   regular  Vitals Entered By: Kern Reap CMA Duncan Dull) (February 05, 2010 9:14 AM)  History of Present Illness: Gabrielle Vargas is a 60 year old female, nonsmoker, who comes in today for reevaluation of hypertension.  She is on hydrochlorothiazide 12.5 mg daily BP running 140/80.  Today's BP 160/90 right arm sitting position.  She says she has chronic neck pain.  She had cervical disk surgery by Dr. Venetia Maxon 5 years ago and, now she's having pain in her neck.  Advised to go back and see Dr. Venetia Maxon, ASAP  Allergies: 1)  ! * Latex 2)  Percocet  Past History:  Past medical, surgical, family and social histories (including risk factors) reviewed for relevance to current acute and chronic problems.  Past Medical History: Reviewed history from 08/18/2008 and no changes required. childbirth x 2 T.ah for DUB ADD hypothyroidism venous insufficiency reduction mammoplasty...implanta ...imp. remioved  Past Surgical History: Reviewed history from 08/18/2008 and no changes required. Rotator cuff repair  R  Family History: Reviewed history from 08/18/2008 and no changes required. Family History of Alcoholism/Addiction Family History of CAD Female 1st degree relative <60 Family History Depression Family History Hypertension mother died of metastatic melanoma  Social History: Reviewed history from 05/09/2007 and no changes required. 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 1 once daily Her updated medication list for this problem includes:    Hydrochlorothiazide 25 Mg Tabs (Hydrochlorothiazide) .Marland Kitchen... Take 1 tablet by mouth every morning  Orders: Prescription Created Electronically [phone])  Complete Medication List: 1)  Synthroid 100 Mcg Tabs (Levothyroxine sodium) .... Once daily 2)  Synthroid 125 Mcg Tabs (Levothyroxine sodium) .... Once daily 3)  Amphetamine Salt Combo 20 Mg Tabs (Amphetamine-dextroamphetamine) .... Take one table once daily 4)  Amphetamine Salt Combo 20 Mg Tabs (Amphetamine-dextroamphetamine) .... Take one tablet once daily   fill in one month 5)  Amphetamine Salt Combo 20 Mg Tabs (Amphetamine-dextroamphetamine) .... Take one tablet once daily   fill in two months 6)  Adult Aspirin Ec Low Strength 81 Mg Tbec (Aspirin) .... Once daily 7)  Premarin 0.625 Mg/gm Crea (Estrogens, conjugated) .... Apply weekly 8)  Hydrochlorothiazide 25 Mg Tabs (Hydrochlorothiazide) .... Take 1 tablet by mouth every morning  Patient Instructions: 1)  increase the hydrochlorothiazide to 25 mg daily in the morning.  Check a morning blood pressure daily x 3 weeks.  If it the end of 3 weeks y  blood pressure is normal.........  That is the systolic is 135 or less and the diastolic is less than 85 or less........... than continue the hydrochlorothiazide 25 mg daily.  If, however,  your blood pressure is not normal...............make an appointment, so we can change y  medication Prescriptions:  SYNTHROID 125 MCG  TABS (LEVOTHYROXINE SODIUM) once daily  #100 x 4   Entered and Authorized by:   Roderick Pee MD   Signed by:   Roderick Pee MD on 02/05/2010   Method  used:   Print then Give to Patient   RxID:   [id number] SYNTHROID 100 MCG  TABS (LEVOTHYROXINE SODIUM) once daily  #100 x 4   Entered and Authorized by:   Roderick Pee MD   Signed by:   Roderick Pee MD on 02/05/2010   Method used:   Print then Give to Patient   RxID:   [id number] HYDROCHLOROTHIAZIDE 25 MG TABS (HYDROCHLOROTHIAZIDE) Take 1 tablet by mouth every morning  #100 x 3   Entered and Authorized by:   Roderick Pee MD   Signed by:   Roderick Pee MD on 02/05/2010   Method used:   Electronically to        [facility]. [phone]* (retail)       [address]       [place], [place]  [address]       Ph: [phone]       Fax: [phone]   RxID:   [id number]

## 2010-08-17 NOTE — Progress Notes (Signed)
Summary: NEW RX  Phone Note Call from Patient Call back at Home Phone [phone]   Caller: Patient Call For: Roderick Pee MD Summary of Call: PT NEEDS GENERIC ADDERALL 20 MG FOR 3 MONTH Initial call taken by: Heron Sabins,  August 21, 2009 3:14 PM    Prescriptions: AMPHETAMINE SALT COMBO 20 MG TABS (AMPHETAMINE-DEXTROAMPHETAMINE) take one tablet once daily   fill in two months  #30 x 0   Entered by:   Kern Reap CMA (AAMA)   Authorized by:   Roderick Pee MD   Signed by:   Kern Reap CMA (AAMA) on 08/21/2009   Method used:   Print then Give to Patient   RxID:   [id number] AMPHETAMINE SALT COMBO 20 MG TABS (AMPHETAMINE-DEXTROAMPHETAMINE) take one tablet once daily   fill in one month  #30 x 0   Entered by:   Kern Reap CMA (AAMA)   Authorized by:   Roderick Pee MD   Signed by:   Kern Reap CMA (AAMA) on 08/21/2009   Method used:   Print then Give to Patient   RxID:   [id number] AMPHETAMINE SALT COMBO 20 MG TABS (AMPHETAMINE-DEXTROAMPHETAMINE) take one table once daily  #30 x 0   Entered by:   Kern Reap CMA (AAMA)   Authorized by:   Roderick Pee MD   Signed by:   Kern Reap CMA (AAMA) on 08/21/2009   Method used:   Print then Give to Patient   RxID:   [id number]

## 2010-08-19 NOTE — Assessment & Plan Note (Signed)
Summary: Tick bite/cb   Vital Signs:  Patient profile:   60 year old female Menstrual status:  hysterectomy Weight:      123 pounds Temp:     98.4 degrees F oral BP sitting:   140 / 96  (left arm) Cuff size:   regular  Vitals Entered By: Kern Reap CMA Duncan Dull) (July 26, 2010 9:37 AM) CC: tick bite  Is Patient Diabetic? Yes Pain Assessment Patient in pain? yes     Location: neck Type: shooting Onset of pain  Constant   CC:  tick bite .  History of Present Illness: Gabrielle Vargas is a 60 year old, married female, nonsmoker, who comes in today for evaluation of 3 problems.  She has a history of underlying ADD and is currently on Adderall 20 mg daily however, the medication is in short supply she would like to discuss her options.  She's also having more trouble with her neck for the past couple months.  Five years ago.  She had cervical disk surgery by Dr. Venetia Maxon.  Now her pain is coming back.  Advised to get his back and see Dr. Venetia Maxon for further evaluation.  She also had a tick bite in her left groin 3 weeks ago.  No fever, etc.  Allergies: 1)  ! * Latex 2)  Percocet  Past History:  Past medical, surgical, family and social histories (including risk factors) reviewed for relevance to current acute and chronic problems.  Past Medical History: Reviewed history from 08/18/2008 and no changes required. childbirth x 2 T.ah for DUB ADD hypothyroidism venous insufficiency reduction mammoplasty...implanta ...imp. remioved  Past Surgical History: Reviewed history from 08/18/2008 and no changes required. Rotator cuff repair  R  Family History: Reviewed history from 08/18/2008 and no changes required. Family History of Alcoholism/Addiction Family History of CAD Female 1st degree relative <60 Family History Depression Family History Hypertension mother died of metastatic melanoma  Social History: Reviewed history from 05/09/2007 and no changes required. 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  No adenopathy Psych:  Cognition and judgment appear intact. Alert and cooperative with normal attention span and concentration. No apparent delusions, illusions, hallucinations   Impression & Recommendations:  Problem # 1:  ADD (ICD-314.00) Assessment Improved  Problem # 2:  DISC DISEASE, CERVICAL (ICD-722.4) Assessment: Deteriorated  Problem # 3:  TICK BITE (ICD-E906.4) Assessment: New  Complete Medication List: 1)  Synthroid 100 Mcg Tabs (Levothyroxine sodium) .... Once daily 2)  Synthroid 125 Mcg Tabs (Levothyroxine sodium) .... Once daily 3)  Amphetamine Salt Combo 20 Mg Tabs (Amphetamine-dextroamphetamine) .... Take one table once daily 4)  Amphetamine Salt Combo 20 Mg Tabs (Amphetamine-dextroamphetamine) .... Take one tablet once daily   fill in one month 5)  Amphetamine Salt Combo 20 Mg Tabs (Amphetamine-dextroamphetamine) .... Take one tablet once daily   fill in two months 6)  Adult Aspirin Ec Low Strength 81 Mg Tbec (Aspirin) .... Once daily 7)  Premarin 0.625 Mg/gm Crea (Estrogens, conjugated) .... Apply weekly 8)  Hydrochlorothiazide 25 Mg Tabs (Hydrochlorothiazide) .... Take 1 tablet by mouth every morning 9)  Adderall 20 Mg Tabs (Amphetamine-dextroamphetamine) .... Take one tab by mouth once daily 10)  Adderall 12.5 Mg  Tabs (Amphetamine-dextroamphetamine) .... Take one and half tab by mouth once daily 11)  Premarin 0.3 Mg Tabs (Estrogens conjugated) .... One tab by mouth at bedtime  Patient Instructions: 1)  called Drs. Venetia Maxon in a neurosurgeon ASAP to begin an evaluation. 2)  Hold the Adderall and see how you  feel.  If, however, he feel like you need to restart it.  Then I will write to new prescription. Prescriptions: AMPHETAMINE SALT COMBO 20 MG TABS (AMPHETAMINE-DEXTROAMPHETAMINE) take one tablet once daily   fill in two months  #30 x 0   Entered and Authorized by:   Roderick Pee MD   Signed by:   Roderick Pee MD on 07/26/2010   Method used:   Print then Give to Patient   RxID:   [id number] AMPHETAMINE SALT COMBO 20 MG TABS (AMPHETAMINE-DEXTROAMPHETAMINE) take one tablet once daily   fill in one month  #30 x 0   Entered and Authorized by:   Roderick Pee MD   Signed by:   Roderick Pee MD on 07/26/2010   Method used:   Print then Give to Patient   RxID:   [id number] AMPHETAMINE SALT COMBO 20 MG TABS (AMPHETAMINE-DEXTROAMPHETAMINE) take one table once daily  #30 x 0   Entered and Authorized by:   Roderick Pee MD   Signed by:   Roderick Pee MD on 07/26/2010   Method used:   Print then Give to Patient   RxID:   [id number] AMPHETAMINE SALT COMBO 20 MG TABS (AMPHETAMINE-DEXTROAMPHETAMINE) take one tablet once daily   fill in two months  #30 x 0   Entered and Authorized by:   Roderick Pee MD   Signed by:   Roderick Pee MD on 07/26/2010   Method used:   Electronically to        [facility]. [phone]* (retail)       [address]       [place], [place]  [address]       Ph: [phone]       Fax: [phone]   RxID:   [id number] AMPHETAMINE SALT COMBO 20 MG TABS (AMPHETAMINE-DEXTROAMPHETAMINE) take one tablet once daily   fill in one month  #30 x 0   Entered and Authorized by:   Roderick Pee MD   Signed by:   Roderick Pee MD on 07/26/2010   Method used:   Electronically to        [facility] [phone]* (retail)       [address]       [place], [place]  [address]       Ph: [phone]       Fax: [phone]   RxID:   [id number] AMPHETAMINE SALT COMBO 20 MG TABS  (AMPHETAMINE-DEXTROAMPHETAMINE) take one table once daily  #30 x 0   Entered and Authorized by:   Roderick Pee MD   Signed by:   Roderick Pee MD on 07/26/2010   Method used:   Electronically to        [facility] [phone]* (retail)       [address]       [place], [place]  [address]       Ph: [phone]       Fax: [phone]   RxID:  [id number]    Orders Added: 1)  Est. Patient Level III [696[address]]

## 2010-08-19 NOTE — Progress Notes (Signed)
Summary: Pt needs to get order to have MRI done re: neck pain  Phone Note Call from Patient Call back at Home Phone [phone]   Caller: Patient Summary of Call: Pt needs order to have an MRI done for neck pain.  Pt said that Dr Venetia Maxon could not see pt until 08/16/10 and recommended that Dr Tawanna Cooler order the MRI and then have results faxed to Dr. Rush Farmer office. Pt is trying to get in sooner to see Dr Rush Farmer.  Initial call taken by: Lucy Antigua,  July 28, 2010 1:37 PM  Follow-up for Phone Call        Pt called re: getting MRI ordered. Pt req call back today, re: this matter.  Follow-up by: Lucy Antigua,  July 29, 2010 10:21 AM  Additional Follow-up for Phone Call Additional follow up Details #1::        since she is seeing the neurosurgeon, the insurance company wants her neurosurgeon to order the study Additional Follow-up by: Roderick Pee MD,  July 29, 2010 11:20 AM    Additional Follow-up for Phone Call Additional follow up Details #2::    Pt aware of this. Follow-up by: Romualdo Bolk, CMA (AAMA),  July 29, 2010 11:37 AM

## 2010-08-19 NOTE — Progress Notes (Signed)
Summary: Pt called Ins and wants [place] to order MRI  Call back at Home Phone [phone]   Caller: Patient Summary of Call: Pt called back saying that she discussed this with her Ins. They told her that we could order the MRI but that we would have to get a prior auth. The number is [phone]. Pt really wants this done prior to seeing the Neursurgeon. Pt is very anxious about this. Initial call taken by: Romualdo Bolk, CMA Duncan Dull),  July 29, 2010 12:07 PM  Follow-up for Phone Call       Follow-up by: Roderick Pee MD,  July 29, 2010 5:49 PM

## 2010-09-09 ENCOUNTER — Other Ambulatory Visit: Payer: Self-pay | Admitting: Family Medicine

## 2010-09-09 DIAGNOSIS — Z780 Asymptomatic menopausal state: Secondary | ICD-10-CM

## 2010-09-09 MED ORDER — ESTROGENS, CONJUGATED 0.625 MG/GM VA CREA: TOPICAL_CREAM | Freq: Every day | VAGINAL | Status: DC

## 2010-09-09 NOTE — Telephone Encounter (Signed)
Pt called and is at [facility] at [employer] and market. Pt had req refill of Premarin .3mg  tabs. Pt said that when she went to pharmacy to pick script, the cream was prescribed. Pls call in the pill form of med asap.  Pt waiting at pharmacy.

## 2010-12-03 NOTE — Op Note (Signed)
NAMEMELIDA, [place]          ACCOUNT NO.:  [id number]   MEDICAL RECORD NO.:  [id number]          PATIENT TYPE:  AMB   LOCATION:  DAY                          FACILITY:  [facility]   PHYSICIAN:  Georges Lynch. Gioffre, M.D.DATE OF BIRTH:  [birth year]   DATE OF PROCEDURE:  07/19/2006  DATE OF DISCHARGE:                               OPERATIVE REPORT   SURGEON:  Georges Lynch. Darrelyn Hillock, M.D.   ASSISTANT:  Jamelle Rushing, P.A.   PREOPERATIVE DIAGNOSES:  1. Torn rotator cuff tendon right shoulder.  2. Severe impingement syndrome right shoulder.   POSTOPERATIVE DIAGNOSES:  1. Torn rotator cuff tendon right shoulder.  2. Severe impingement syndrome right shoulder.   OPERATION:  1. Open acromionectomy right shoulder.  2. Repair of a small tear rotator cuff tendon right shoulder utilizing      restore tendon graft with two Mitek sutures.   PROCEDURE:  The patient first had a interscalene nerve block on the  right.  Following that, she was taken back to surgery and administered a  general anesthetic.  Sterile prep and draping of the right shoulder was  carried out, after she had 1 gram of IV Ancef.  At this time, a small  incision was made over the anterior aspect of the right shoulder.  Bleeders identified and cauterized.  The incision was carried down to  the acromion.  I then dissected the deltoid tendon from the acromion,  exposed the acromion and then inserted a Bennett retractor, and did an  acromionectomy, and utilized the bur to even out the undersurface of the  acromion.  I then bone waxed the undersurface of the acromion.  She had  severe impingement syndrome of the right shoulder.  Her rotator cuff had  a small tear and it was markedly thinned out.  Because of the nature the  tendon, I elected to go ahead and repair the tendon utilizing a restore  tendon graft with two Mitek sutures.  We thoroughly irrigated out the  area, reapproximated deltoid tendon and muscle in the usual  fashion.  Subcu was closed with Vicryl, skin with metal staples.  Sterile  neosporin dressing was applied.  The patient was then placed in a  shoulder immobilizer.           ______________________________  Georges Lynch Darrelyn Hillock, M.D.     RAG/MEDQ  D:  07/19/2006  T:  07/19/2006  Job:  [id number]

## 2010-12-03 NOTE — Assessment & Plan Note (Signed)
[facility]                               PULMONARY OFFICE NOTE   NAME:Vargas, Gabrielle                 MRN:          [id number]  DATE:01/31/2006                            DOB:          1950-11-07    PROBLEM:  A 60 year old nonsmoker seen at the kind request of Dr. Tawanna Cooler in  pulmonary consultation because of persistent cough and discomfort  swallowing.   HISTORY:  She complains of cough that has been most pronounced since the  middle of May of 2007.  She actually associates this with neck surgery for  anterior fixation of cervical degenerative disk disease by a left anterior  neck approach in November of 2007.  She had some cough apparently during the  winter, but did not really begin focusing on the pattern until it would not  go away.  She was tentatively diagnosed with asthma, having no prior history  of asthma.  She tried a prednisone taper and cough syrup.  While she was  visiting in [place] this spring she was seen by a physician there and  given a trial of Advair 250 and then has tried Qvar 40, but felt neither of  these helped.  She has had a tussive right lateral rib pain aggravated by  deep breathing or stretching which she says feels like a rib fracture.  In  the last few weeks she had had some cough productive of green sputum with a  low-grade temperature up to a maximum around 100.3.  She was given 10 days  of Biaxin which helped while she was on it.  The cough has returned with  green sputum when she quit.  Cough is intense enough that it wakes her some  from sleep and is aggravated by laughing, yawning, sneezing, eating, and  drinking.   REVIEW OF SYSTEMS:  Some sense of heaviness or discomfort through the middle  chest without dyspnea while up and walking.  She notices some difficulty  swallowing certain textures of food, primarily hard foods such as carrots.  If she extends her neck she feels a streak of pain down the  posterior left  neck into the left shoulder.  Depending on the position of her head while  lying down or standing she has a sensation like a furball in her throat  which causes cough.  Occasional tussive right lateral rib pains became  persistent.  They improved, but have not gone away and are distinctly  aggravated by deep breath, twisting, or bending.  She is not aware of any  sort of sinus discomfort.  She denies any awareness of reflux upward but  says if she swallows the feeling is not quite right in her throat and she  thinks her gag reflex may be reduced.  She is told that she snores and may  have some apnea events, apparently not a lot.   PAST HISTORY:  1.  C5-6 fixation surgery for cervical spine degenerative disease in      November of 2006.  2.  Hypertension.  3.  Mild seasonal asthma.  4.  Elevated cholesterol.  5.  Kidney stone.  6.  Hysterectomy.  7.  Breast surgery in 1989 for benign disease.  8.  Hypothyroidism.  9.  Bilateral carpal tunnel surgery since spring of 2007.  10. 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   MEDICATIONS:  1.  Synthroid 0.125 mg.  2.  HCTZ 12.5 mg.  3.  Premarin 0.3 mg.  4.  Aspirin 81 mg.  5.  Vitamin D.  6.  Qvar 40 two puffs daily.   Drug intolerance to LATEX.   SOCIAL HISTORY:  Married.  Never smoked.  Has twin sister.  Works as a  Armed forces operational officer.   FAMILY HISTORY:  Mother was a smoker with COPD.  Father had heart disease.  Brothers with cancer.  Nobody known to have history of asthma.   OBJECTIVE:  VITAL SIGNS:  Weight 134 pounds, blood pressure 156/98, pulse  regular, 80, room air saturation 97%.  GENERAL:  Well-developed, well-nourished woman.  No evident distress.  SKIN:  No rash.  ADENOPATHY:  None found.  HEENT:  Healed left anterior vertical neck incision.  There is no strider.  Voice quality is normal.  Nasal airway is unobstructed with minimal septal  deviation.  Posterior pharynx may be slightly glandular, but  not  erythematous.  Tongue base is unremarkable.  Neck veins are not distended.  I do not feel enlargement of thyroid.  Some throat clearing is noted.  CHEST:  Very minimal chest congestion.  No wheeze, rhonchi, or increased  work of breathing.  HEART:  Sounds regular without murmur or gallop.  EXTREMITIES:  No clubbing, cyanosis, edema, or tremor.   Chest x-ray:  She brings a film from [facility] dated January 16, 2006 which  shows prominent bronchovascular markings consistent with a bronchitis, but  no effusion or obvious adenopathy or distinct acute process.  Cervical spine  surgical changes are noted.   IMPRESSION:  1.  Postoperative changes in her neck with placement of an anterior fixation      device may be causing some of her symptoms by posterior to anterior      pressure on the posterior esophagus and trachea.  2.  Cyclical cough, likely including a component of reflux with bronchitis.      She may be refluxing considerably more than she realizes.  3.  Possible sleep apnea based on history.  Will be addressed later.   PLAN:  1.  Augmentin 875 mg b.i.d. x10 days.  2.  Okay to remain off of Advair and Qvar for now.  3.  Try sample Singulair 10 mg daily for two weeks.  4.  Try over-the-counter Prilosec b.i.d. for one month before breakfast and      supper.  5.  Return three to four weeks at which time we will consider blood work,      modified barium swallow.   I appreciate the chance to meet her.                                   Gabrielle D. Maple Hudson, MD, FCCP, FACP   CDY/MedQ  DD:  01/31/2006  DT:  02/01/2006  Job #:  [id number]   cc:   Tinnie Gens A. Tawanna Cooler, MD

## 2010-12-03 NOTE — Op Note (Signed)
[place]  Patient:    Gabrielle Vargas, Gabrielle Vargas                 MRN: [id number] Proc. Date: 12/15/99 Adm. Date:  [id number] Disc. Date: [id number] Attending:  Kandis Mannan CC:         Heywood Iles, M.D.             [place] Daphine Deutscher, M.D.             James L. Randa Evens, M.D.                           Operative Report  CCS NUMBER:  [id number]  PREOPERATIVE DIAGNOSES: 1. Probable posterior fissure in ano. 2. Anal polyp.  POSTOPERATIVE DIAGNOSES: 1. Probable posterior fissure in ano. 2. Anal polyp.  OPERATION:  Anoscopy with excision of anal polyp.  SURGEON:  Maisie Fus B. Samuella Cota, M.D.  ANESTHESIA:  General.  ANESTHESIOLOGIST:  Burna Forts, M.D. and CRNA.  DESCRIPTION OF PROCEDURE:  The patient was taken to the operating room and placed on the table in the supine position.  After satisfactory general anesthetic with intubation, the patient was placed in the lithotomy position. The perirectal area was prepped and draped in a sterile field.  The patient was dilated to two fingerbreadths and the sphincter muscle was somewhat tight. The large anoscope was then inserted and the patient had no significant hemorrhoids.  There was no evidence of an anterior fissure.  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  There were no significant hemorrhoids.  She did have an anal polyp at about the 7 oclock position.  The polyp was clamped across the base and amputated.  The base was then suture ligated with 2-0 chromic catgut.  It was sutured twice to get good hemostasis.  One suture was a tie and the other was a running suture.  It was elected not to do an internal sphincterotomy.  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  She did have some slight redundant tissue at the 6 oclock position at the skin edge, and this was excised and cauterized.  Bleeding was controlled.  The patient  seemed to tolerate the procedure well and was taken to the PACU in satisfactory condition. DD:  12/15/99 TD:  12/19/99 Job: [id number]

## 2010-12-03 NOTE — Op Note (Signed)
[name], ARCH NO.:  [id number]   MEDICAL RECORD NO.:  [id number]          PATIENT TYPE:  INP   LOCATION:  2899                         FACILITY:  [facility]   PHYSICIAN:  Danae Orleans. Venetia Maxon, M.D.  DATE OF BIRTH:  1950/07/22   DATE OF PROCEDURE:  05/19/2005  DATE OF DISCHARGE:                                 OPERATIVE REPORT   PREOPERATIVE DIAGNOSIS:  Herniated cervical disc with cervical spondylosis,  degenerative disc disease, and radiculopathy, C5-C6 and C6-C7 levels.   POSTOPERATIVE DIAGNOSIS:  Herniated cervical disc with cervical spondylosis,  degenerative disc disease, and radiculopathy, C5-C6 and C6-C7 levels.   PROCEDURE:  Anterior cervical decompression and fusion C5-C6 and C6-C7  levels with PEEK interbody cages, morselized bone autograft, and anterior  cervical plate.   SURGEON:  Danae Orleans. Venetia Maxon, M.D.   ASSISTANT:  Stefani Dama, M.D.  Susie L. Burns, R.N.   ANESTHESIA:  General endotracheal anesthesia.   ESTIMATED BLOOD LOSS:  Minimal.   COMPLICATIONS:  None.   DISPOSITION:  Recovery room.   INDICATIONS FOR PROCEDURE:  Gabrielle Vargas is a 60 year old woman with  significant left upper extremity pain with significant cervical spondylosis  and degenerative disc disease with a herniated disc at C5-C6 and C6-C7  levels.  It was elected to take her to surgery for anterior cervical  decompression and fusion at C5-C6 and C6-C7 levels.   PROCEDURE:  Gabrielle Vargas was brought to the operating room.  Following  satisfactory uncomplicated induction of general endotracheal anesthesia and  placement of intravenous  lines, the patient was placed in a supine position  on the operating table.  Her neck was placed in slight extension.  She was  placed in 5 pounds of halter traction.  Her anterior neck was then prepped  and draped in the usual sterile fashion.  The area of planned incision was  infiltrated with 0.25% Marcaine and 0.5% lidocaine with  1:200,000  epinephrine.  An incision was made from the midline to the anterior border  of the sternocleidomastoid muscle on the left side of the midline and  carried sharply through the platysmal layer.  Subplatysmal dissection was  performed exposing the anterior border of the sternocleidomastoid muscle.  Using blunt dissection, the carotid sheath was kept lateral, trachea and  esophagus kept medial, exposing the anterior cervical spine.  A bent spinal  needle was placed at what was felt to be the C5-C6 level and this was  confirmed on interoperative x-ray.  Subsequently, the longus colli muscles  were taken down from the anterior cervical spine from the C5 to C7  bilaterally using electrocautery and Key elevator.  A self-retaining  Shadowline retractor along with up/down retractor was used to facilitate  exposure.  Ventral osteophytes were removed with Leksell rongeur and bone  was retained for later use in bone grafting.  Subsequently, the disc spaces  at C5-C6 and C6-C7 were incised with a 15 blade.  Both discs were highly  degenerated.  Disc spaces were cleared with a variety of Carlens curets.  Disc space spreaders were placed to further facilitate evacuation of the  interspace.  Initially, at the C5-C6 level, the microscope was brought onto  the field.  Using microscopic visualization and a high speed drill, the  endplates of C5 and C6  were decorticated and osteophytes drilled down.  The  posterior longitudinal ligament was incised with the knife and removed in  piecemeal fashion with good resulting decompression of the spinal canal and  both C6 nerve roots. Hemostasis was assured with Gelfoam soaked in thrombin.  After trial sizing, a 6 mm PEEK interbody cage was selected, packed with  morselized autograft, inserted in the interspace, and counter sunk  appropriately.  Attention was turned to the C6-C7 level where similar  decompression was performed.  At this level, there was a  significant amount  of spondylitic material compressing both C7 nerve roots, worse on the left  than the right.  These were both decompressed as was the central spinal cord  dura.  Again, hemostasis was assured and a similarly sized PEEK interbody  cage was selected, packed with morselized bone autograft, inserted into the  interspace and counter sunk appropriately.  Subsequently, a 31 mm Alphatec  anterior cervical plate was affixed to the anterior cervical spine using 12  mm variable angle screws, two at C5, two at C6, two at C7.  All screws had  excellent purchase.  Locking mechanisms were engaged after final radiograph  showed well positioned interbody graft and anterior cervical plate.  Head  halter traction was removed prior to placing the plate.  Subsequently, the  soft tissues were inspected and found to be in good repair.  Hemostasis was  assured.  The platysmal layer was closed with 3-0 Vicryl sutures and the  skin edges were reapproximated with interrupted 3-0 Vicryl subcuticular  stitch.  The wound was dressed with Dermabond.  The patient was extubated in  the operating room and taken to the recovery room in stable condition having  tolerated the operation well.  Counts were correct at the end of the case.      Danae Orleans. Venetia Maxon, M.D.  Electronically Signed     JDS/MEDQ  D:  05/19/2005  T:  05/20/2005  Job:  [id number]

## 2010-12-03 NOTE — Procedures (Signed)
[facility]  Patient:    Gabrielle Vargas, Gabrielle Vargas                 MRN: [id number] Proc. Date: 11/24/99 Adm. Date:  [id number] Attending:  Orland Mustard CC:         Abigail Miyamoto, M.D.             Darius Bump, M.D.                           Procedure Report  PROCEDURE:  Colonoscopy.  ENDOSCOPIST:  Llana Aliment. Edwards, M.D.  MEDICATIONS:  Fentanyl 75 mcg and Versed 7 mg IV.  SCOPE:  Olympus adult video colonoscope.  INDICATION:  Possible polyp on rectal exam.  DESCRIPTION OF PROCEDURE:  Procedure had been explained to patient and consent obtained.  With the patient in the left lateral decubitus position, the Olympus adult video colonoscope was inserted and advanced under direct visualization.  The prep was excellent.  We were able to advance to the cecum with minimal difficulty.  The right lower quadrant was transilluminated and ileocecal valve seen and entered for a short distance.  Scope withdrawn. Cecum, ascending colon, hepatic flexure, transverse colon, splenic flexure, descending and sigmoid colon seen well upon removal; no significant diverticular disease and no polyps.  Scope was withdrawn in the rectum and a forward view of the rectum was normal.  In the retroflexed view, patient was seen to have internal hemorrhoids and what appeared to be a large 1.0- to 1.5-cm anal polyp that was sitting right on the internal hemorrhoids.  This could have been a thrombosed hemorrhoid or true polyp.  Due to the proximity to the hemorrhoids and the external sphincter, I elected not to attempt to remove this.  This was documented photographically and the scope removed. There was a small healing anal fissure.  ASSESSMENT: 1. Anal polyp too close to the anal canal to remove endoscopically. 2. Internal hemorrhoids. 3. Possible anal fissure, appears to be nearly healed.  PLAN:  Will give the patient instructions about hemorrhoids, keeping her  stool soft, etc.  Will have her see Dr. Abigail Miyamoto back in the office to see about removal of this anal polyp. DD:  11/24/99 TD:  11/25/99 Job: [id number]

## 2010-12-03 NOTE — Assessment & Plan Note (Signed)
[facility]                               PULMONARY OFFICE NOTE   NAME:MCCLINTOCKDanely, Gabrielle                 MRN:          [id number]  DATE:02/21/2006                            DOB:          03-23-1951    PROBLEM:  Cough.   HISTORY:  She returns now saying she feels slowly but distinctly better,  having completed a 10-day course of Augmentin and two weeks of Singulair  samples while continuing Prilosec OTC b.i.d.  She still notices a little  cough but says it is much less, no longer disruptive.  Rare tussive  incontinence.  Some episodic throat level tightness, mostly if she laughs or  with the first bite of food.  It does not sound as if she is aspirating or  refluxing actively that she recognizes.  In the morning, she still has some  cough with white phlegm and feels a little congested in the chest.   OBJECTIVE:  GENERAL APPEARANCE:  She looks quite well.  VITAL SIGNS:  Weight 126 pounds, blood pressure 142/88, pulse regular 90,  saturation on room air 96%.  HEENT:  May be minimal huskiness in the voice.  Her throat might be slightly  redder than normal but I see no adenomatous change.  No stridor.  No visible  post nasal drainage.  NECK:  There is no cervical adenopathy or thyromegaly.  Her left cervical  incision is well healed.  LUNGS:  Clear with good air flow.  No wheeze or cough demonstrated now.  CARDIOVASCULAR:  Heart sounds are regular without murmur or gallop.   IMPRESSION:  Gradually improving pharyngitis or tracheitis with  laryngospasm.  I cannot tell if she is refluxing a little and it remains  unclear whether there is a relationship to the mechanical effects of her  cervical fusion.  We talked two options and decided the best approach would  be to give her time and see if this will go on and resolve completely.  We  did decide to let her try two more weeks of Singulair samples.  If she does  not get better, she understands to  call back at which point we will schedule  a modified barium swallow and perhaps other evaluation as needed.   PLAN:  1.  Continue Prilosec b.i.d. until problems resolve.  2.  Two more weeks of Singulair samples, 10 mg daily.  3.  Saline gargle b.i.d. for trial.  4.  If no better, she will call back to set up follow-up appointment and      modified barium swallow.  Meanwhile I will see her p.r.n. and she will      follow with Dr. Tawanna Cooler for primary care.                                  Clinton D. Maple Hudson, MD, FCCP, FACP   CDY/MedQ  DD:  02/21/2006  DT:  02/21/2006  Job #:  [id number]   cc:   Tinnie Gens A. Tawanna Cooler, MD

## 2011-01-11 ENCOUNTER — Encounter: Payer: Self-pay | Admitting: Family Medicine

## 2011-01-11 ENCOUNTER — Ambulatory Visit (INDEPENDENT_AMBULATORY_CARE_PROVIDER_SITE_OTHER): Payer: Medicare Other | Admitting: Family Medicine

## 2011-01-11 DIAGNOSIS — Z780 Asymptomatic menopausal state: Secondary | ICD-10-CM

## 2011-01-11 DIAGNOSIS — E039 Hypothyroidism, unspecified: Secondary | ICD-10-CM

## 2011-01-11 DIAGNOSIS — R030 Elevated blood-pressure reading, without diagnosis of hypertension: Secondary | ICD-10-CM

## 2011-01-11 DIAGNOSIS — F988 Other specified behavioral and emotional disorders with onset usually occurring in childhood and adolescence: Secondary | ICD-10-CM

## 2011-01-11 DIAGNOSIS — N952 Postmenopausal atrophic vaginitis: Secondary | ICD-10-CM

## 2011-01-11 DIAGNOSIS — I10 Essential (primary) hypertension: Secondary | ICD-10-CM

## 2011-01-11 LAB — LIPID PANEL
Cholesterol: 265 mg/dL — ABNORMAL HIGH (ref 0–200)
HDL: 73.5 mg/dL
Total CHOL/HDL Ratio: 4
Triglycerides: 135 mg/dL (ref 0.0–149.0)
VLDL: 27 mg/dL (ref 0.0–40.0)

## 2011-01-11 LAB — CBC WITH DIFFERENTIAL/PLATELET
Basophils Absolute: 0 K/uL (ref 0.0–0.1)
Basophils Relative: 0.9 % (ref 0.0–3.0)
Eosinophils Absolute: 0.1 K/uL (ref 0.0–0.7)
Eosinophils Relative: 1.6 % (ref 0.0–5.0)
HCT: 40 % (ref 36.0–46.0)
Hemoglobin: 13.6 g/dL (ref 12.0–15.0)
Lymphocytes Relative: 35.4 % (ref 12.0–46.0)
Lymphs Abs: 1.6 K/uL (ref 0.7–4.0)
MCHC: 33.9 g/dL (ref 30.0–36.0)
MCV: 86.6 fl (ref 78.0–100.0)
Monocytes Absolute: 0.3 K/uL (ref 0.1–1.0)
Monocytes Relative: 6.5 % (ref 3.0–12.0)
Neutro Abs: 2.4 K/uL (ref 1.4–7.7)
Neutrophils Relative %: 55.6 % (ref 43.0–77.0)
Platelets: 225 K/uL (ref 150.0–400.0)
RBC: 4.63 Mil/uL (ref 3.87–5.11)
RDW: 14.1 % (ref 11.5–14.6)
WBC: 4.4 K/uL — ABNORMAL LOW (ref 4.5–10.5)

## 2011-01-11 LAB — POCT URINALYSIS DIPSTICK
Bilirubin, UA: NEGATIVE
Blood, UA: NEGATIVE
Glucose, UA: NEGATIVE
Ketones, UA: NEGATIVE
Leukocytes, UA: NEGATIVE
Nitrite, UA: NEGATIVE
Protein, UA: NEGATIVE
Spec Grav, UA: 1.02
Urobilinogen, UA: 0.2
pH, UA: 5

## 2011-01-11 LAB — HEPATIC FUNCTION PANEL
ALT: 19 U/L (ref 0–35)
AST: 24 U/L (ref 0–37)
Albumin: 4.2 g/dL (ref 3.5–5.2)
Alkaline Phosphatase: 92 U/L (ref 39–117)
Bilirubin, Direct: 0.1 mg/dL (ref 0.0–0.3)
Total Bilirubin: 0.5 mg/dL (ref 0.3–1.2)
Total Protein: 6.9 g/dL (ref 6.0–8.3)

## 2011-01-11 LAB — LDL CHOLESTEROL, DIRECT: Direct LDL: 160.5 mg/dL

## 2011-01-11 LAB — BASIC METABOLIC PANEL
CO2: 30 mEq/L (ref 19–32)
Calcium: 9 mg/dL (ref 8.4–10.5)
Chloride: 102 mEq/L (ref 96–112)
Creatinine, Ser: 0.7 mg/dL (ref 0.4–1.2)
GFR: 93.1 mL/min (ref >60.00)
Potassium: 3.7 mEq/L (ref 3.5–5.1)
Sodium: 140 mEq/L (ref 135–145)

## 2011-01-11 LAB — TSH: TSH: 0.2 u[IU]/mL — ABNORMAL LOW (ref 0.35–5.50)

## 2011-01-11 MED ORDER — LEVOTHYROXINE SODIUM 100 MCG PO TABS: 100.0000 ug | ORAL_TABLET | Freq: Every day | ORAL | Status: DC

## 2011-01-11 MED ORDER — HYDROCHLOROTHIAZIDE 25 MG PO TABS: 25.0000 mg | ORAL_TABLET | Freq: Every day | ORAL | Status: DC

## 2011-01-11 MED ORDER — ESTROGENS CONJUGATED 0.3 MG PO TABS: 0.3000 mg | ORAL_TABLET | Freq: Every day | ORAL | Status: DC

## 2011-01-11 NOTE — Patient Instructions (Signed)
Continue your current medications.  Be sure to wear sunscreens SPF 50.  Follow-up in one year, sooner if any problems.  Call two weeks from being out of your third prescription of Adderall for refills

## 2011-01-11 NOTE — Progress Notes (Signed)
  Subjective:    Patient ID: Gabrielle Vargas, female    DOB: 17-Sep-1950, 60 y.o.   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  Four adult ADD.  She takes Adderall 20 mg daily.  For hypertension.  She takes hydrochlorothiazide 25 mg daily BP 110/80.  For hypothyroidism.  She takes Synthroid one daily.  She also takes Premarin .3daily for hot flashes, and a baby aspirin.  Her most pressing ongoing problems continue.  Neck and shoulder pain.  She has a Midwife in an orthopedist for evaluating her.  Currently.  She gets routine eye care, dental care, does not do BSE monthly, annual mammography yes, colonoscopy, normal.  Tetanus 2006.    Review of Systems  Constitutional: Negative.   HENT: Negative.   Eyes: Negative.   Respiratory: Negative.   Cardiovascular: Negative.   Gastrointestinal: Negative.   Genitourinary: Negative.   Musculoskeletal: Negative.   Neurological: Negative.   Hematological: Negative.   Psychiatric/Behavioral: Negative.        Objective:   Physical Exam  Constitutional: She appears well-developed and well-nourished.  HENT:  Head: Normocephalic and atraumatic.  Right Ear: External ear normal.  Left Ear: External ear normal.  Nose: Nose normal.  Mouth/Throat: Oropharynx is clear and moist.  Eyes: EOM are normal. Pupils are equal, round, and reactive to light.  Neck: Normal range of motion. Neck supple. No thyromegaly present.  Cardiovascular: Normal rate, regular rhythm, normal heart sounds and intact distal pulses.  Exam reveals no gallop and no friction rub.   No murmur heard. Pulmonary/Chest: Effort normal and breath sounds normal.  Abdominal: Soft. Bowel sounds are normal. She exhibits no distension and no mass. There is no tenderness. There  is no rebound.  Genitourinary: Vagina normal. Guaiac negative stool. No vaginal discharge found.       Bilateral breast exam shows scars from previous reduction mammoplasty.  Multiple fibrocystic changes throughout both breasts  Musculoskeletal: Normal range of motion.  Lymphadenopathy:    She has no cervical adenopathy.  Neurological: She is alert. She has normal reflexes. No cranial nerve deficit. She exhibits normal muscle tone. Coordination normal.  Skin: Skin is warm and dry.  Psychiatric: She has a normal mood and affect. Her behavior is normal. Judgment and thought content normal.          Assessment & Plan:  Healthy female.  Adult ADD continue Adderall 20 daily p.r.n.  Mild hypertension.  Continue hydrochlorothiazide 25 mg daily.  Hypothyroidism.  Continue Synthroid one daily.  Postmenopausal vaginal dryness with hot flushes.  Continue Premarin .3 daily.  Ongoing neck and shoulder pain.  Follow-up by orthopedist and neurosurgeon.  Advised to maintain good health habits and do a breast exam monthly follow-up in one year sooner if any problem

## 2011-03-01 ENCOUNTER — Telehealth: Payer: Self-pay | Admitting: Family Medicine

## 2011-03-01 NOTE — Telephone Encounter (Signed)
Pt is on adderall 20. Wants new rs for the 40mg  so she can cut in half. Needs 3 rxs. Thanks.

## 2011-03-02 ENCOUNTER — Other Ambulatory Visit (INDEPENDENT_AMBULATORY_CARE_PROVIDER_SITE_OTHER): Payer: Medicare Other

## 2011-03-02 DIAGNOSIS — E039 Hypothyroidism, unspecified: Secondary | ICD-10-CM

## 2011-03-02 LAB — TSH: TSH: 0.93 u[IU]/mL (ref 0.35–5.50)

## 2011-03-02 MED ORDER — AMPHETAMINE-DEXTROAMPHETAMINE 20 MG PO TABS: 20.0000 mg | ORAL_TABLET | Freq: Every day | ORAL | Status: DC

## 2011-03-02 NOTE — Telephone Encounter (Signed)
no

## 2011-03-15 ENCOUNTER — Other Ambulatory Visit: Payer: Self-pay | Admitting: Family Medicine

## 2011-05-25 ENCOUNTER — Telehealth: Payer: Self-pay | Admitting: Family Medicine

## 2011-05-25 MED ORDER — AMPHETAMINE-DEXTROAMPHETAMINE 20 MG PO TABS: 20.0000 mg | ORAL_TABLET | Freq: Every day | ORAL | Status: DC

## 2011-05-25 NOTE — Telephone Encounter (Signed)
She gets 3 rxs at a time. Thanks.

## 2011-05-25 NOTE — Telephone Encounter (Signed)
rx ready for pick up and patient is aware  

## 2011-05-25 NOTE — Telephone Encounter (Signed)
Refill generic Adderall. Thanks. °

## 2011-09-08 ENCOUNTER — Telehealth: Payer: Self-pay | Admitting: Family Medicine

## 2011-09-08 MED ORDER — AMPHETAMINE-DEXTROAMPHETAMINE 20 MG PO TABS: 20.0000 mg | ORAL_TABLET | Freq: Every day | ORAL | Status: DC

## 2011-09-08 NOTE — Telephone Encounter (Signed)
rx ready for pick up. Left message on machine for patient. 

## 2011-09-08 NOTE — Telephone Encounter (Signed)
Pt need new rx generic adderall 20 mg °

## 2011-10-04 ENCOUNTER — Other Ambulatory Visit: Payer: Self-pay | Admitting: Family Medicine

## 2011-12-05 ENCOUNTER — Other Ambulatory Visit: Payer: Self-pay | Admitting: Family Medicine

## 2011-12-05 MED ORDER — AMPHETAMINE-DEXTROAMPHETAMINE 20 MG PO TABS: 20.0000 mg | ORAL_TABLET | Freq: Every day | ORAL | Status: DC

## 2011-12-05 NOTE — Telephone Encounter (Signed)
Rx ready for pick up and patient is aware 

## 2011-12-05 NOTE — Telephone Encounter (Signed)
Pt needs new rx generic adderall 20 mg °

## 2012-02-03 ENCOUNTER — Other Ambulatory Visit: Payer: Self-pay | Admitting: Family Medicine

## 2012-02-03 DIAGNOSIS — Z1231 Encounter for screening mammogram for malignant neoplasm of breast: Secondary | ICD-10-CM

## 2012-02-06 ENCOUNTER — Other Ambulatory Visit (INDEPENDENT_AMBULATORY_CARE_PROVIDER_SITE_OTHER): Payer: Medicare Other

## 2012-02-06 DIAGNOSIS — Z0000 Encounter for general adult medical examination without abnormal findings: Secondary | ICD-10-CM

## 2012-02-06 DIAGNOSIS — E039 Hypothyroidism, unspecified: Secondary | ICD-10-CM

## 2012-02-06 DIAGNOSIS — I10 Essential (primary) hypertension: Secondary | ICD-10-CM

## 2012-02-06 DIAGNOSIS — Z79899 Other long term (current) drug therapy: Secondary | ICD-10-CM

## 2012-02-06 LAB — CBC WITH DIFFERENTIAL/PLATELET
Eosinophils Absolute: 0.1 10*3/uL (ref 0.0–0.7)
Eosinophils Relative: 2.2 % (ref 0.0–5.0)
Hemoglobin: 13.7 g/dL (ref 12.0–15.0)
Lymphocytes Relative: 39.4 % (ref 12.0–46.0)
Lymphs Abs: 1.8 10*3/uL (ref 0.7–4.0)
Monocytes Absolute: 0.3 10*3/uL (ref 0.1–1.0)
Monocytes Relative: 6 % (ref 3.0–12.0)
Neutro Abs: 2.3 10*3/uL (ref 1.4–7.7)
RDW: 13.8 % (ref 11.5–14.6)
WBC: 4.6 10*3/uL (ref 4.5–10.5)

## 2012-02-06 LAB — LIPID PANEL
Cholesterol: 269 mg/dL — ABNORMAL HIGH (ref 0–200)
HDL: 78.4 mg/dL (ref >39.00)
Total CHOL/HDL Ratio: 3
VLDL: 22 mg/dL (ref 0.0–40.0)

## 2012-02-06 LAB — BASIC METABOLIC PANEL
Calcium: 9.4 mg/dL (ref 8.4–10.5)
GFR: 78.04 mL/min (ref >60.00)
Glucose, Bld: 114 mg/dL — ABNORMAL HIGH (ref 70–99)
Sodium: 138 mEq/L (ref 135–145)

## 2012-02-06 LAB — HEPATIC FUNCTION PANEL
ALT: 19 U/L (ref 0–35)
AST: 24 U/L (ref 0–37)
Bilirubin, Direct: 0 mg/dL (ref 0.0–0.3)
Total Bilirubin: 0.9 mg/dL (ref 0.3–1.2)

## 2012-02-06 LAB — POCT URINALYSIS DIPSTICK
Blood, UA: NEGATIVE
Glucose, UA: NEGATIVE
Nitrite, UA: NEGATIVE
Protein, UA: NEGATIVE
Urobilinogen, UA: 0.2

## 2012-02-08 ENCOUNTER — Other Ambulatory Visit: Payer: Self-pay | Admitting: *Deleted

## 2012-02-08 DIAGNOSIS — I10 Essential (primary) hypertension: Secondary | ICD-10-CM

## 2012-02-08 DIAGNOSIS — E039 Hypothyroidism, unspecified: Secondary | ICD-10-CM

## 2012-02-08 MED ORDER — SYNTHROID 100 MCG PO TABS: 100.0000 ug | ORAL_TABLET | Freq: Every day | ORAL | Status: DC

## 2012-02-20 ENCOUNTER — Encounter: Payer: Self-pay | Admitting: Family Medicine

## 2012-02-20 ENCOUNTER — Ambulatory Visit (INDEPENDENT_AMBULATORY_CARE_PROVIDER_SITE_OTHER): Payer: Medicare Other | Admitting: Family Medicine

## 2012-02-20 VITALS — BP 140/90 | Temp 98.6°F | Ht 59.75 in | Wt 122.0 lb

## 2012-02-20 DIAGNOSIS — F988 Other specified behavioral and emotional disorders with onset usually occurring in childhood and adolescence: Secondary | ICD-10-CM

## 2012-02-20 DIAGNOSIS — M5030 Other cervical disc degeneration, unspecified cervical region: Secondary | ICD-10-CM

## 2012-02-20 DIAGNOSIS — I10 Essential (primary) hypertension: Secondary | ICD-10-CM

## 2012-02-20 DIAGNOSIS — N952 Postmenopausal atrophic vaginitis: Secondary | ICD-10-CM

## 2012-02-20 DIAGNOSIS — I872 Venous insufficiency (chronic) (peripheral): Secondary | ICD-10-CM

## 2012-02-20 DIAGNOSIS — Z780 Asymptomatic menopausal state: Secondary | ICD-10-CM

## 2012-02-20 DIAGNOSIS — R030 Elevated blood-pressure reading, without diagnosis of hypertension: Secondary | ICD-10-CM

## 2012-02-20 DIAGNOSIS — E039 Hypothyroidism, unspecified: Secondary | ICD-10-CM

## 2012-02-20 MED ORDER — AMPHETAMINE-DEXTROAMPHETAMINE 20 MG PO TABS: 20.0000 mg | ORAL_TABLET | Freq: Every day | ORAL | Status: DC

## 2012-02-20 MED ORDER — HYDROCHLOROTHIAZIDE 25 MG PO TABS
25.0000 mg | ORAL_TABLET | Freq: Every day | ORAL | Status: DC
Start: 2012-02-20 — End: 2013-01-14

## 2012-02-20 MED ORDER — SYNTHROID 100 MCG PO TABS: 100.0000 ug | ORAL_TABLET | Freq: Every day | ORAL | Status: DC

## 2012-02-20 MED ORDER — ESTROGENS CONJUGATED 0.3 MG PO TABS: ORAL_TABLET | ORAL | Status: DC

## 2012-02-20 NOTE — Progress Notes (Signed)
  Subjective:    Patient ID: Gabrielle Vargas, female    DOB: 12-Jul-1951, 61 y.o.   MRN: [id number]  HPI Is a 61 year old female who comes in today for general physical examination  She takes Adderall 20 mg daily for a history of adult ADD  She also takes hydrochlorothiazide daily for fluid retention  She takes Premarin 0.3 daily because of symptoms of post menopausal vaginal dryness. She also takes Synthroid 100 mcg daily for hypothyroidism  She's had surgery on her shoulder and is seeing Dr. Augusto Gamble start about her cervical disc disease. She's having more pain. The last time she saw Dr. Venetia Maxon was about 2 years ago. Advised return to see him ASAP  Tetanus 2006, she gets routine eye care, dental care, BSE monthly, last mammogram 2 years ago advised annual mammography. Colonoscopy in early 50s normal   Review of Systems  Constitutional: Negative.   HENT: Negative.   Eyes: Negative.   Respiratory: Negative.   Cardiovascular: Negative.   Gastrointestinal: Negative.   Genitourinary: Negative.   Musculoskeletal: Negative.   Neurological: Negative.   Hematological: Negative.   Psychiatric/Behavioral: Negative.        Objective:   Physical Exam  Constitutional: She appears well-developed and well-nourished.  HENT:  Head: Normocephalic and atraumatic.  Right Ear: External ear normal.  Left Ear: External ear normal.  Nose: Nose normal.  Mouth/Throat: Oropharynx is clear and moist.  Eyes: EOM are normal. Pupils are equal, round, and reactive to light.  Neck: Normal range of motion. Neck supple. No thyromegaly present.  Cardiovascular: Normal rate, regular rhythm, normal heart sounds and intact distal pulses.  Exam reveals no gallop and no friction rub.   No murmur heard. Pulmonary/Chest: Effort normal and breath sounds normal.  Abdominal: Soft. Bowel sounds are normal. She exhibits no distension and no mass. There is no tenderness. There is no rebound.  Genitourinary: Vagina  normal and uterus normal. Guaiac negative stool. No vaginal discharge found.  Musculoskeletal: Normal range of motion.  Lymphadenopathy:    She has no cervical adenopathy.  Neurological: She is alert. She has normal reflexes. No cranial nerve deficit. She exhibits normal muscle tone. Coordination normal.  Skin: Skin is warm and dry.  Psychiatric: She has a normal mood and affect. Her behavior is normal. Judgment and thought content normal.          Assessment & Plan:  Healthy female  Adult ADD continue Adderall  Fluid retention continue hydrochlorothiazide 25 mg daily  Post menopausal hot flashes Premarin 0.3 daily  Hypothyroidism continue Synthroid 100 mcg daily  Encouraged shingles vaccine

## 2012-02-20 NOTE — Patient Instructions (Addendum)
Continue your current medications  Call you neurosurgeon for a followup appointment since her neck pain is getting worse  Call your insurance company to see where they would cover the shingles faxing  Remember to do a good thorough breast exam monthly and get a mammogram annually

## 2012-02-23 ENCOUNTER — Ambulatory Visit (HOSPITAL_COMMUNITY): Payer: Medicare Other

## 2012-06-06 ENCOUNTER — Other Ambulatory Visit: Payer: Self-pay | Admitting: Family Medicine

## 2012-06-06 DIAGNOSIS — F988 Other specified behavioral and emotional disorders with onset usually occurring in childhood and adolescence: Secondary | ICD-10-CM

## 2012-06-06 MED ORDER — AMPHETAMINE-DEXTROAMPHETAMINE 20 MG PO TABS: 20.0000 mg | ORAL_TABLET | Freq: Every day | ORAL | Status: DC

## 2012-06-06 NOTE — Telephone Encounter (Signed)
Rx ready for pick up and patient is aware 

## 2012-06-06 NOTE — Telephone Encounter (Signed)
Pt needs new rx generic adderall 20 mg °

## 2012-09-10 ENCOUNTER — Telehealth: Payer: Self-pay | Admitting: Family Medicine

## 2012-09-10 MED ORDER — AMPHETAMINE-DEXTROAMPHETAMINE 30 MG PO TABS: 30.0000 mg | ORAL_TABLET | Freq: Every day | ORAL | Status: DC

## 2012-09-10 NOTE — Telephone Encounter (Signed)
Pt needs refill for amphetamine-dextroamphetamine (ADDERALL) 20 MG tablet.  Pt would like to increase that amount to the next level.

## 2012-09-10 NOTE — Telephone Encounter (Signed)
Increase her Adderall to 25 mg daily give her 3 prescriptions please

## 2012-09-11 NOTE — Telephone Encounter (Signed)
Rx ready for pick up and patient is aware 

## 2012-11-13 ENCOUNTER — Other Ambulatory Visit: Payer: Self-pay | Admitting: Family Medicine

## 2012-12-13 ENCOUNTER — Telehealth: Payer: Self-pay | Admitting: Family Medicine

## 2012-12-13 MED ORDER — AMPHETAMINE-DEXTROAMPHETAMINE 30 MG PO TABS: 30.0000 mg | ORAL_TABLET | Freq: Every day | ORAL | Status: DC

## 2012-12-13 NOTE — Telephone Encounter (Signed)
Pt needs new rx generic adderall 30 mg °

## 2012-12-13 NOTE — Telephone Encounter (Signed)
Rx ready for pick up.  Left message on machine for patient. 

## 2013-01-14 ENCOUNTER — Other Ambulatory Visit: Payer: Self-pay | Admitting: *Deleted

## 2013-01-14 DIAGNOSIS — I10 Essential (primary) hypertension: Secondary | ICD-10-CM

## 2013-01-14 MED ORDER — HYDROCHLOROTHIAZIDE 25 MG PO TABS: 25.0000 mg | ORAL_TABLET | Freq: Every day | ORAL | Status: DC

## 2013-04-08 ENCOUNTER — Telehealth: Payer: Self-pay | Admitting: Family Medicine

## 2013-04-08 MED ORDER — AMPHETAMINE-DEXTROAMPHETAMINE 30 MG PO TABS: 30.0000 mg | ORAL_TABLET | Freq: Every day | ORAL | Status: DC

## 2013-04-08 NOTE — Telephone Encounter (Signed)
Rx ready for pick up Left message on machine  

## 2013-04-08 NOTE — Telephone Encounter (Signed)
Pt requesting amphetamine-dextroamphetamine (ADDERALL) 30 MG tablet, 3 mnths supply. Call when ready for pick up.

## 2013-04-11 ENCOUNTER — Other Ambulatory Visit: Payer: Self-pay | Admitting: Family Medicine

## 2013-05-07 ENCOUNTER — Other Ambulatory Visit: Payer: Self-pay | Admitting: Family Medicine

## 2013-05-07 DIAGNOSIS — Z1231 Encounter for screening mammogram for malignant neoplasm of breast: Secondary | ICD-10-CM

## 2013-05-11 ENCOUNTER — Other Ambulatory Visit: Payer: Self-pay | Admitting: Family Medicine

## 2013-05-13 ENCOUNTER — Telehealth: Payer: Self-pay | Admitting: Family Medicine

## 2013-05-17 ENCOUNTER — Ambulatory Visit (HOSPITAL_COMMUNITY)
Admission: RE | Admit: 2013-05-17 | Discharge: 2013-05-17 | Disposition: A | Discharge status: Home / Self Care | Payer: Medicare Other | Source: Ambulatory Visit | Attending: Family Medicine | Admitting: Family Medicine

## 2013-05-17 DIAGNOSIS — Z1231 Encounter for screening mammogram for malignant neoplasm of breast: Secondary | ICD-10-CM | POA: Insufficient documentation

## 2013-06-18 ENCOUNTER — Other Ambulatory Visit (INDEPENDENT_AMBULATORY_CARE_PROVIDER_SITE_OTHER): Payer: Medicare Other

## 2013-06-18 DIAGNOSIS — E039 Hypothyroidism, unspecified: Secondary | ICD-10-CM

## 2013-06-18 DIAGNOSIS — I10 Essential (primary) hypertension: Secondary | ICD-10-CM

## 2013-06-18 DIAGNOSIS — Z0000 Encounter for general adult medical examination without abnormal findings: Secondary | ICD-10-CM

## 2013-06-18 LAB — POCT URINALYSIS DIPSTICK
Bilirubin, UA: NEGATIVE
Blood, UA: NEGATIVE
Glucose, UA: NEGATIVE
Spec Grav, UA: 1.02

## 2013-06-18 LAB — HEPATIC FUNCTION PANEL
AST: 23 U/L (ref 0–37)
Alkaline Phosphatase: 85 U/L (ref 39–117)
Bilirubin, Direct: 0 mg/dL (ref 0.0–0.3)
Total Bilirubin: 0.5 mg/dL (ref 0.3–1.2)

## 2013-06-18 LAB — CBC WITH DIFFERENTIAL/PLATELET
Basophils Absolute: 0 10*3/uL (ref 0.0–0.1)
Hemoglobin: 14.4 g/dL (ref 12.0–15.0)
Lymphocytes Relative: 35.3 % (ref 12.0–46.0)
MCHC: 33.9 g/dL (ref 30.0–36.0)
Monocytes Absolute: 0.3 10*3/uL (ref 0.1–1.0)
Monocytes Relative: 6.1 % (ref 3.0–12.0)
Neutrophils Relative %: 55.3 % (ref 43.0–77.0)
Platelets: 268 10*3/uL (ref 150.0–400.0)
RDW: 12.7 % (ref 11.5–14.6)

## 2013-06-18 LAB — BASIC METABOLIC PANEL
BUN: 20 mg/dL (ref 6–23)
CO2: 31 mEq/L (ref 19–32)
Calcium: 9.5 mg/dL (ref 8.4–10.5)
Creatinine, Ser: 0.7 mg/dL (ref 0.4–1.2)
GFR: 83.79 mL/min (ref >60.00)
Glucose, Bld: 108 mg/dL — ABNORMAL HIGH (ref 70–99)
Potassium: 3.5 mEq/L (ref 3.5–5.1)
Sodium: 137 mEq/L (ref 135–145)

## 2013-06-18 LAB — LIPID PANEL
Cholesterol: 261 mg/dL — ABNORMAL HIGH (ref 0–200)
HDL: 62.1 mg/dL (ref >39.00)
Total CHOL/HDL Ratio: 4
Triglycerides: 116 mg/dL (ref 0.0–149.0)

## 2013-06-18 LAB — LDL CHOLESTEROL, DIRECT: Direct LDL: 184.4 mg/dL

## 2013-06-25 ENCOUNTER — Encounter: Payer: Self-pay | Admitting: Family Medicine

## 2013-06-25 ENCOUNTER — Ambulatory Visit (INDEPENDENT_AMBULATORY_CARE_PROVIDER_SITE_OTHER): Payer: Medicare Other | Admitting: Family Medicine

## 2013-06-25 VITALS — BP 140/90 | Temp 98.1°F | Ht 60.0 in | Wt 125.0 lb

## 2013-06-25 DIAGNOSIS — E039 Hypothyroidism, unspecified: Secondary | ICD-10-CM

## 2013-06-25 DIAGNOSIS — I10 Essential (primary) hypertension: Secondary | ICD-10-CM

## 2013-06-25 DIAGNOSIS — I872 Venous insufficiency (chronic) (peripheral): Secondary | ICD-10-CM

## 2013-06-25 DIAGNOSIS — F988 Other specified behavioral and emotional disorders with onset usually occurring in childhood and adolescence: Secondary | ICD-10-CM

## 2013-06-25 MED ORDER — AMPHETAMINE-DEXTROAMPHETAMINE 30 MG PO TABS: 30.0000 mg | ORAL_TABLET | Freq: Every day | ORAL | Status: DC

## 2013-06-25 MED ORDER — SYNTHROID 100 MCG PO TABS: ORAL_TABLET | ORAL | Status: DC

## 2013-06-25 MED ORDER — ESTROGENS CONJUGATED 0.3 MG PO TABS: ORAL_TABLET | ORAL | Status: DC

## 2013-06-25 MED ORDER — HYDROCHLOROTHIAZIDE 25 MG PO TABS: ORAL_TABLET | ORAL | Status: DC

## 2013-06-25 NOTE — Patient Instructions (Signed)
Continue your current medications  Remember to call 2 weeks from being out of your third Adderall prescription for refills  Remember to wear your sunscreens SPF 50+  Followup in 1 year sooner if any problems  Do that breast exam monthly

## 2013-06-25 NOTE — Progress Notes (Signed)
   Subjective:    Patient ID: Gabrielle Vargas, female    DOB: 1951/01/21, 62 y.o.   MRN: [id number]  HPI Kassity is a 62 year old female nonsmoker who comes in today for general physical examination because of a history of adult ADD, postmenopausal vaginal dryness and hot flashes, venous insufficiency, hypothyroidism  Her med list reviewed there've been no changes.  She gets routine eye care, dental care, and you mammography but not does not do BSE monthly.  Colonoscopy in her 41s were normal  She noticed a popping sensation in her right buttocks in October. It occurred would just twisting no trauma.  She would also like a complete skin exam   Review of Systems  Constitutional: Negative.   HENT: Negative.   Eyes: Negative.   Respiratory: Negative.   Cardiovascular: Negative.   Gastrointestinal: Negative.   Endocrine: Negative.   Genitourinary: Negative.   Musculoskeletal: Negative.   Allergic/Immunologic: Negative.   Neurological: Negative.   Hematological: Negative.   Psychiatric/Behavioral: Negative.        Objective:   Physical Exam  Nursing note and vitals reviewed. Constitutional: She appears well-developed and well-nourished.  HENT:  Head: Normocephalic and atraumatic.  Right Ear: External ear normal.  Left Ear: External ear normal.  Nose: Nose normal.  Mouth/Throat: Oropharynx is clear and moist.  Eyes: EOM are normal. Pupils are equal, round, and reactive to light.  Neck: Normal range of motion. Neck supple. No thyromegaly present.  Cardiovascular: Normal rate, regular rhythm, normal heart sounds and intact distal pulses.  Exam reveals no gallop and no friction rub.   No murmur heard. Pulmonary/Chest: Effort normal and breath sounds normal.  Abdominal: Soft. Bowel sounds are normal. She exhibits no distension and no mass. There is no tenderness. There is no rebound.  Genitourinary: Vagina normal. Guaiac negative stool. No vaginal discharge found.    Bilateral breast exam normal except for scars from previous reduction mammoplasty  BSE was taught and encouraged  Musculoskeletal: Normal range of motion.  Lymphadenopathy:    She has no cervical adenopathy.  Neurological: She is alert. She has normal reflexes. No cranial nerve deficit. She exhibits normal muscle tone. Coordination normal.  Skin: Skin is warm and dry.  Total body skin exam normal  Psychiatric: She has a normal mood and affect. Her behavior is normal. Judgment and thought content normal.          Assessment & Plan:  Healthy female  Hypothyroidism continue Synthroid 100 mcg daily  Venous insufficiency continue hydrochlorothiazide 25 mg daily  Postmenopausal vaginal dryness and hot flashes Premarin 0.3 twice weekly  Adult ADD continue Adderall

## 2013-07-26 NOTE — Telephone Encounter (Signed)
Opened in error

## 2013-07-29 ENCOUNTER — Telehealth: Payer: Self-pay | Admitting: Family Medicine

## 2013-07-29 DIAGNOSIS — K5792 Diverticulitis of intestine, part unspecified, without perforation or abscess without bleeding: Secondary | ICD-10-CM

## 2013-07-29 NOTE — Telephone Encounter (Signed)
Patient Information:  Caller Name: Girl  Phone: [phone]  Patient: Gabrielle Vargas, Gabrielle Vargas  Gender: Female  DOB: [birth year]  Age: 63 Years  PCP: Stevie Kern [facility])  Office Follow Up:  Does the office need to follow up with this patient?: Yes  Instructions For The Office: Please f/u with pt, thank you.   Symptoms  Reason For Call & Symptoms: Pt states she is having Diverticulitis, tenderness LLQ, fever 101.0 oral and chills 07/28/13.  Pt asking if she can get Rx antibiotic  Reviewed Health History In EMR: Yes  Reviewed Medications In EMR: Yes  Reviewed Allergies In EMR: Yes  Reviewed Surgeries / Procedures: Yes  Date of Onset of Symptoms: 07/26/2013  Guideline(s) Used:  No Protocol Available - Sick Adult  Disposition Per Guideline:   Discuss with PCP and Callback by Nurse Today  Reason For Disposition Reached:   Nursing judgment  Advice Given:  Call Back If:  New symptoms develop  You become worse.  Patient Refused Recommendation:  Patient Requests Prescription  Pt requesting Rx.

## 2013-07-29 NOTE — Telephone Encounter (Signed)
Patient is aware 

## 2013-08-06 ENCOUNTER — Encounter: Payer: Self-pay | Admitting: Gastroenterology

## 2013-08-26 ENCOUNTER — Encounter: Payer: Self-pay | Admitting: Gastroenterology

## 2013-08-26 ENCOUNTER — Ambulatory Visit (INDEPENDENT_AMBULATORY_CARE_PROVIDER_SITE_OTHER): Payer: Medicare Other | Admitting: Gastroenterology

## 2013-08-26 VITALS — BP 128/90 | HR 100 | Ht 59.5 in | Wt 125.0 lb

## 2013-08-26 DIAGNOSIS — R109 Unspecified abdominal pain: Secondary | ICD-10-CM

## 2013-08-26 DIAGNOSIS — Z1211 Encounter for screening for malignant neoplasm of colon: Secondary | ICD-10-CM

## 2013-08-26 MED ORDER — SOD PICOSULFATE-MAG OX-CIT ACD 10-3.5-12 MG-GM-GM PO PACK: 1.0000 | PACK | ORAL | Status: DC

## 2013-08-26 NOTE — Progress Notes (Signed)
    History of Present Illness: This is a 63 year old female who noted the onset of lower abdominal pain and left back pain after moving heavy furniture in early January. The symptoms have improved but are intermittently persistent. She is noted a additional symptom over the past several weeks of a feeling of pelvic pressure and pelvic fullness. None of her symptoms are related to meals or bowel movements. She previously underwent colonoscopy by Dr. 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 Denies weight loss, constipation, diarrhea, change in stool caliber, melena, hematochezia, nausea, vomiting, dysphagia, reflux symptoms, chest pain.  Review of Systems: Pertinent positive and negative review of systems were noted in the above HPI section. All other review of systems were otherwise negative.  Current Medications, Allergies, Past Medical History, Past Surgical History, Family History and Social History were reviewed in [employer] record.  Physical Exam: General: Well developed , well nourished, no acute distress Head: Normocephalic and atraumatic Eyes:  sclerae anicteric, EOMI Ears: Normal auditory acuity Mouth: No deformity or lesions Neck: Supple, no masses or thyromegaly Lungs: Clear throughout to auscultation Heart: Regular rate and rhythm; no murmurs, rubs or bruits Abdomen: Soft, minimal suprapubic tenderness to deep palpation and non distended. No masses, hepatosplenomegaly or hernias noted. Normal Bowel sounds Rectal: Deferred to colonoscopy Musculoskeletal: Symmetrical with no gross deformities  Skin: No lesions on visible extremities Pulses:  Normal pulses noted Extremities: No clubbing, cyanosis, edema or deformities noted Neurological: Alert oriented x 4, grossly nonfocal Cervical Nodes:  No significant cervical adenopathy Inguinal  Nodes: No significant inguinal adenopathy Psychological:  Alert and cooperative. Normal mood and affect  Assessment and Recommendations:  1. Lower abdominal pain, pelvic pressure and fullness. Rule out GYN disorder, intra-abdominal mass lesions. Suspect at least a portion her symptoms are musculoskeletal. Schedule abdominal/pelvic CT and UA. Probably will need GYN evaluation.  2. Colorectal cancer screening, average risk, overdue for colonoscopy. The risks, benefits, and alternatives to colonoscopy with possible biopsy and possible polypectomy were discussed with the patient and they consent to proceed.

## 2013-08-26 NOTE — Patient Instructions (Signed)
Your physician has requested that you go to the basement for the following lab work before leaving today: urinalysis.  You have been scheduled for a colonoscopy with propofol. Please follow written instructions given to you at your visit today.  Please pick up your prep kit at the pharmacy within the next 1-3 days. If you use inhalers (even only as needed), please bring them with you on the day of your procedure.  You have been scheduled for a CT scan of the abdomen and pelvis at [facility] ([address]---this is in the same building as [employer]).   You are scheduled on 09/02/13 at 2:00pm. You should arrive 15 minutes prior to your appointment time for registration. Please follow the written instructions below on the day of your exam:  WARNING: IF YOU ARE ALLERGIC TO IODINE/X-RAY DYE, PLEASE NOTIFY RADIOLOGY IMMEDIATELY AT [phone]! YOU WILL BE GIVEN A 13 HOUR PREMEDICATION PREP.  1) Do not eat or drink anything after 10:00am (4 hours prior to your test) 2) You have been given 2 bottles of oral contrast to drink. The solution may taste better if refrigerated, but do NOT add ice or any other liquid to this solution. Shake well before drinking.    Drink 1 bottle of contrast @ 12:00pm (2 hours prior to your exam)  Drink 1 bottle of contrast @ 1:00pm (1 hour prior to your exam)  You may take any medications as prescribed with a small amount of water except for the following: Metformin, Glucophage, Glucovance, Avandamet, Riomet, Fortamet, Actoplus Met, Janumet, Glumetza or Metaglip. The above medications must be held the day of the exam AND 48 hours after the exam.  The purpose of you drinking the oral contrast is to aid in the visualization of your intestinal tract. The contrast solution may cause some diarrhea. Before your exam is started, you will be given a small amount of fluid to drink. Depending on your individual set of symptoms, you may also receive an  intravenous injection of x-ray contrast/dye. Plan on being at [facility] for 30 minutes or long, depending on the type of exam you are having performed.  This test typically takes 30-45 minutes to complete.  If you have any questions regarding your exam or if you need to reschedule, you may call the CT department at [phone] between the hours of 8:00 am and 5:00 pm, Monday-Friday.  ________________________________________________________________________   Thank you for choosing me and [facility].  Pricilla Riffle. Dagoberto Ligas., MD., [facility]

## 2013-08-29 ENCOUNTER — Encounter: Payer: Self-pay | Admitting: Gastroenterology

## 2013-09-02 ENCOUNTER — Other Ambulatory Visit: Payer: Medicare Other

## 2013-09-06 ENCOUNTER — Ambulatory Visit (INDEPENDENT_AMBULATORY_CARE_PROVIDER_SITE_OTHER)
Admission: RE | Admit: 2013-09-06 | Discharge: 2013-09-06 | Disposition: A | Discharge status: Home / Self Care | Payer: Medicare Other | Source: Ambulatory Visit | Attending: Gastroenterology | Admitting: Gastroenterology

## 2013-09-06 ENCOUNTER — Telehealth: Payer: Self-pay | Admitting: Family Medicine

## 2013-09-06 ENCOUNTER — Other Ambulatory Visit: Payer: Self-pay

## 2013-09-06 DIAGNOSIS — R109 Unspecified abdominal pain: Secondary | ICD-10-CM

## 2013-09-06 MED ORDER — IOHEXOL 300 MG/ML  SOLN
100.0000 mL | Freq: Once | INTRAMUSCULAR | Status: AC | PRN
  Administered 2013-09-06: 100 mL via INTRAVENOUS

## 2013-09-06 MED ORDER — METRONIDAZOLE 500 MG PO TABS
500.0000 mg | ORAL_TABLET | Freq: Two times a day (BID) | ORAL | Status: DC
Start: 2013-09-06 — End: 2013-09-17

## 2013-09-06 MED ORDER — CIPROFLOXACIN HCL 500 MG PO TABS: 500.0000 mg | ORAL_TABLET | Freq: Two times a day (BID) | ORAL | Status: DC

## 2013-09-06 NOTE — Telephone Encounter (Signed)
Pt seen by dr stark. Has mild case of diverticulitis. Also told pt she has nodule on lower left lobe (70mm). Advised pt to see pcp. Made appt w/ dr todd when he gets back, do you think pt should wait that long or see another provider?

## 2013-09-06 NOTE — Telephone Encounter (Signed)
Spoke with patient and okay to wait for Dr Sherren Mocha per Dr Shawna Orleans

## 2013-09-17 ENCOUNTER — Telehealth: Payer: Self-pay | Admitting: Gastroenterology

## 2013-09-17 ENCOUNTER — Ambulatory Visit (INDEPENDENT_AMBULATORY_CARE_PROVIDER_SITE_OTHER): Payer: Medicare Other | Admitting: Family Medicine

## 2013-09-17 ENCOUNTER — Encounter: Payer: Self-pay | Admitting: Family Medicine

## 2013-09-17 VITALS — BP 130/80 | Temp 98.8°F | Wt 127.0 lb

## 2013-09-17 DIAGNOSIS — K5732 Diverticulitis of large intestine without perforation or abscess without bleeding: Secondary | ICD-10-CM

## 2013-09-17 HISTORY — DX: Diverticulitis of large intestine without perforation or abscess without bleeding: K57.32

## 2013-09-17 NOTE — Progress Notes (Signed)
Pre visit review using our clinic review tool, if applicable. No additional management support is needed unless otherwise documented below in the visit note. 

## 2013-09-17 NOTE — Telephone Encounter (Signed)
Patient will come by and pick up free prep. Left at front desk.

## 2013-09-17 NOTE — Progress Notes (Signed)
   Subjective:    Patient ID: Gabrielle Vargas, female    DOB: 11-Feb-1951, 63 y.o.   MRN: [id number]  HPI Gabrielle Vargas is a 63 year old married female nonsmoker who comes in today for evaluation of a couple problems  She says that she developed some abdominal discomfort went to see Dr. Fuller Plan. They ordered a CT scan of her abdomen which showed some diverticuli and some diverticulitis. They gave her Cipro and Flagyl which she took until last Friday. She developed some loose bowel movements for the Cipro and Flagyl no diarrhea and states that her plumbing now is starting to return to normal except she has a hemorrhoid fungal in the bathroom so frequently.  She has a pressure sensation in her pelvic area. CT scan shows no evidence of any pathology in the pelvis. Specifically for her regular sister and removed and the adnexal areas all appear normal. She's not having any traction films. She describes as a pressure sensation this very ill-defined and points to the mid pubic area as the area. It does not keep her awake at night. She's had no fever chills nausea vomiting or diarrhea.  The CT scan shows a 4 mm left lower lobe pulmonary nodule. She had pneumonia in that area couple years ago. Low Riss because of no tobacco use therefore followup diagnostic studies are not indicated per protocol    Review of Systems    GYN GYN review of systems otherwise negative Objective:   Physical Exam  Well-developed well-nourished female no acute distress  Examination the abdomen shows the abdomen is soft the bowel sounds are normal there is some tenderness left lower quadrant no rebound pelvic exam normal no palpable abnormalities uterus surgically removed. Rectum normal stool guaiac-negative      Assessment & Plan:  Symptoms of diverticulitis slowly resolving plan continue followup in GI

## 2013-09-17 NOTE — Patient Instructions (Signed)
Once the loose bowel movements from the medication wears off be sure to drink lots of water and take a stool softener daily. Avoid nuts and seeds  Followup with GI as outlined

## 2013-10-11 ENCOUNTER — Ambulatory Visit (AMBULATORY_SURGERY_CENTER): Payer: Medicare Other | Admitting: Gastroenterology

## 2013-10-11 ENCOUNTER — Encounter: Payer: Self-pay | Admitting: Gastroenterology

## 2013-10-11 VITALS — BP 130/87 | HR 64 | Temp 98.0°F | Resp 13 | Ht 59.5 in | Wt 125.0 lb

## 2013-10-11 DIAGNOSIS — Z1211 Encounter for screening for malignant neoplasm of colon: Secondary | ICD-10-CM

## 2013-10-11 NOTE — Progress Notes (Signed)
Pt stable to RR 

## 2013-10-11 NOTE — Progress Notes (Signed)
No complaints noted in the recovery room. Maw   

## 2013-10-11 NOTE — Op Note (Signed)
[facility]  [address] [place], [id number]   COLONOSCOPY PROCEDURE REPORT  PATIENT: Gabrielle Vargas, Gabrielle Vargas  MR#: [id number] BIRTHDATE: 01-22-1951 , 63  yrs. old GENDER: Female ENDOSCOPIST: Ladene Artist, MD, [facility] REFERRED KW:[id number] Delora Fuel, M.D. PROCEDURE DATE:  10/11/2013 PROCEDURE:   Colonoscopy, screening First Screening Colonoscopy - Avg.  risk and is 50 yrs.  old or older - No.  Prior Negative Screening - Now for repeat screening. 10 or more years since last screening  History of Adenoma - Now for follow-up colonoscopy & has been > or = to 3 yrs.  N/A  Polyps Removed Today? No.  Recommend repeat exam, <10 yrs? No. ASA CLASS:   Class II INDICATIONS:average risk screening. MEDICATIONS: MAC sedation, administered by CRNA and propofol (Diprivan) 200mg  IV DESCRIPTION OF PROCEDURE:   After the risks benefits and alternatives of the procedure were thoroughly explained, informed consent was obtained.  A digital rectal exam revealed no abnormalities of the rectum.   The LB [id number]  endoscope was introduced through the anus and advanced to the cecum, which was identified by both the appendix and ileocecal valve. No adverse events experienced.   The quality of the prep was good, using MoviPrep  The instrument was then slowly withdrawn as the colon was fully examined.  COLON FINDINGS: Moderate diverticulosis was noted in the sigmoid colon.   The colon was otherwise normal.  There was no diverticulosis, inflammation, polyps or cancers unless previously stated.  Retroflexed views revealed small internal hemorrhoids. The time to cecum=5 minutes 52 seconds.  Withdrawal time=9 minutes 10 seconds.  The scope was withdrawn and the procedure completed.  COMPLICATIONS: There were no complications.  ENDOSCOPIC IMPRESSION: 1.   Moderate diverticulosis in the sigmoid colon 2.   Small internal hemorrhoids  RECOMMENDATIONS: 1.  High fiber diet with liberal fluid  intake. 2.  Continue to follow colorectal cancer screening guidelines for "routine risk" patients with a repeat colonoscopy in 10 years. There is no need for routine, screening FOBT (stool) testing for at least 5 years. 3.  No GI cause for ongoing lower abdominal pain noted  eSigned:  Ladene Artist, MD, [facility] 10/11/2013 3:27 PM

## 2013-10-11 NOTE — Patient Instructions (Signed)
YOU HAD AN ENDOSCOPIC PROCEDURE TODAY AT THE [facility]: Refer to the procedure report that was given to you for any specific questions about what was found during the examination.  If the procedure report does not answer your questions, please call your gastroenterologist to clarify.  If you requested that your care partner not be given the details of your procedure findings, then the procedure report has been included in a sealed envelope for you to review at your convenience later.  YOU SHOULD EXPECT: Some feelings of bloating in the abdomen. Passage of more gas than usual.  Walking can help get rid of the air that was put into your GI tract during the procedure and reduce the bloating. If you had a lower endoscopy (such as a colonoscopy or flexible sigmoidoscopy) you may notice spotting of blood in your stool or on the toilet paper. If you underwent a bowel prep for your procedure, then you may not have a normal bowel movement for a few days.  DIET: Your first meal following the procedure should be a light meal and then it is ok to progress to your normal diet.  A half-sandwich or bowl of soup is an example of a good first meal.  Heavy or fried foods are harder to digest and may make you feel nauseous or bloated.  Likewise meals heavy in dairy and vegetables can cause extra gas to form and this can also increase the bloating.  Drink plenty of fluids but you should avoid alcoholic beverages for 24 hours.  ACTIVITY: Your care partner should take you home directly after the procedure.  You should plan to take it easy, moving slowly for the rest of the day.  You can resume normal activity the day after the procedure however you should NOT DRIVE or use heavy machinery for 24 hours (because of the sedation medicines used during the test).    SYMPTOMS TO REPORT IMMEDIATELY: A gastroenterologist can be reached at any hour.  During normal business hours, 8:30 AM to 5:00 PM Monday through Friday,  call [phone].  After hours and on weekends, please call the GI answering service at [phone] who will take a message and have the physician on call contact you.   Following lower endoscopy (colonoscopy or flexible sigmoidoscopy):  Excessive amounts of blood in the stool  Significant tenderness or worsening of abdominal pains  Swelling of the abdomen that is new, acute  Fever of 100F or higher   FOLLOW UP: If any biopsies were taken you will be contacted by phone or by letter within the next 1-3 weeks.  Call your gastroenterologist if you have not heard about the biopsies in 3 weeks.  Our staff will call the home number listed on your records the next business day following your procedure to check on you and address any questions or concerns that you may have at that time regarding the information given to you following your procedure. This is a courtesy call and so if there is no answer at the home number and we have not heard from you through the emergency physician on call, we will assume that you have returned to your regular daily activities without incident.  SIGNATURES/CONFIDENTIALITY: You and/or your care partner have signed paperwork which will be entered into your electronic medical record.  These signatures attest to the fact that that the information above on your After Visit Summary has been reviewed and is understood.  Full responsibility of the confidentiality of   this discharge information lies with you and/or your care-partner.     Handouts were given to your care partner on  diverticulosis, a high fiber diet with liberal fluid intake and hemorrhoids. You may resume your current medications today. Please call if any questions or concerns.   

## 2013-10-14 ENCOUNTER — Telehealth: Payer: Self-pay | Admitting: *Deleted

## 2013-10-14 NOTE — Telephone Encounter (Signed)
  Follow up Call-  Call back number 10/11/2013  Post procedure Call Back phone  # [phone]  Permission to leave phone message Yes     Patient questions:  Do you have a fever, pain , or abdominal swelling? no Pain Score  0 *  Have you tolerated food without any problems? yes  Have you been able to return to your normal activities? yes  Do you have any questions about your discharge instructions: Diet   no Medications  no Follow up visit  no  Do you have questions or concerns about your Care? no  Actions: * If pain score is 4 or above: No action needed, pain <4.

## 2013-12-20 ENCOUNTER — Telehealth: Payer: Self-pay | Admitting: Family Medicine

## 2013-12-20 DIAGNOSIS — F988 Other specified behavioral and emotional disorders with onset usually occurring in childhood and adolescence: Secondary | ICD-10-CM

## 2013-12-20 NOTE — Telephone Encounter (Signed)
Pt need new rx generic adderall 30 mg

## 2013-12-24 MED ORDER — AMPHETAMINE-DEXTROAMPHETAMINE 30 MG PO TABS: 30.0000 mg | ORAL_TABLET | Freq: Every day | ORAL | Status: DC

## 2013-12-24 NOTE — Telephone Encounter (Signed)
Rx ready for pick up and patient is aware 

## 2014-01-28 ENCOUNTER — Telehealth: Payer: Self-pay | Admitting: Gastroenterology

## 2014-01-28 NOTE — Telephone Encounter (Signed)
Patient was treated for diverticulitis in February with cipro and flagyl.  She reports pain in her knees for the last several months and believes this is related to cipro.  She is advised that she should see her primary care for these symptoms. She wanted Dr. Fuller Plan to be aware and have it noted in her chart.

## 2014-03-13 ENCOUNTER — Other Ambulatory Visit: Payer: Self-pay | Admitting: Orthopedic Surgery

## 2014-03-13 DIAGNOSIS — R609 Edema, unspecified: Secondary | ICD-10-CM

## 2014-03-13 DIAGNOSIS — M25561 Pain in right knee: Secondary | ICD-10-CM

## 2014-03-13 HISTORY — DX: Pain in right knee: M25.561

## 2014-03-20 ENCOUNTER — Other Ambulatory Visit: Payer: Medicare Other

## 2014-03-20 ENCOUNTER — Ambulatory Visit
Admission: RE | Admit: 2014-03-20 | Discharge: 2014-03-20 | Disposition: A | Discharge status: Home / Self Care | Payer: Medicare Other | Source: Ambulatory Visit | Attending: Orthopedic Surgery | Admitting: Orthopedic Surgery

## 2014-03-20 DIAGNOSIS — M25561 Pain in right knee: Secondary | ICD-10-CM

## 2014-03-20 DIAGNOSIS — R609 Edema, unspecified: Secondary | ICD-10-CM

## 2014-04-14 ENCOUNTER — Telehealth: Payer: Self-pay | Admitting: Family Medicine

## 2014-04-14 DIAGNOSIS — F988 Other specified behavioral and emotional disorders with onset usually occurring in childhood and adolescence: Secondary | ICD-10-CM

## 2014-04-14 MED ORDER — AMPHETAMINE-DEXTROAMPHETAMINE 30 MG PO TABS: 30.0000 mg | ORAL_TABLET | Freq: Every day | ORAL | Status: DC

## 2014-04-14 NOTE — Telephone Encounter (Signed)
Pt needs new rxs generic adderall 30 mg

## 2014-04-14 NOTE — Telephone Encounter (Signed)
Rx ready for pick up and Left message on machine for patient   

## 2014-06-03 DIAGNOSIS — Z9889 Other specified postprocedural states: Secondary | ICD-10-CM | POA: Insufficient documentation

## 2014-06-03 HISTORY — DX: Other specified postprocedural states: Z98.890

## 2014-06-18 ENCOUNTER — Other Ambulatory Visit: Payer: Self-pay | Admitting: Family Medicine

## 2014-06-18 DIAGNOSIS — Z1231 Encounter for screening mammogram for malignant neoplasm of breast: Secondary | ICD-10-CM

## 2014-06-19 ENCOUNTER — Other Ambulatory Visit (INDEPENDENT_AMBULATORY_CARE_PROVIDER_SITE_OTHER): Payer: Medicare Other

## 2014-06-19 DIAGNOSIS — E039 Hypothyroidism, unspecified: Secondary | ICD-10-CM

## 2014-06-19 DIAGNOSIS — I10 Essential (primary) hypertension: Secondary | ICD-10-CM

## 2014-06-19 DIAGNOSIS — Z0000 Encounter for general adult medical examination without abnormal findings: Secondary | ICD-10-CM

## 2014-06-19 LAB — CBC WITH DIFFERENTIAL/PLATELET
Basophils Absolute: 0.1 10*3/uL (ref 0.0–0.1)
Basophils Relative: 0.9 % (ref 0.0–3.0)
EOS PCT: 2.3 % (ref 0.0–5.0)
Eosinophils Absolute: 0.1 10*3/uL (ref 0.0–0.7)
HEMATOCRIT: 44.6 % (ref 36.0–46.0)
HEMOGLOBIN: 14.8 g/dL (ref 12.0–15.0)
LYMPHS ABS: 2.2 10*3/uL (ref 0.7–4.0)
LYMPHS PCT: 34.5 % (ref 12.0–46.0)
MCHC: 33.2 g/dL (ref 30.0–36.0)
MCV: 87.4 fl (ref 78.0–100.0)
MONOS PCT: 5.3 % (ref 3.0–12.0)
Monocytes Absolute: 0.3 10*3/uL (ref 0.1–1.0)
Neutro Abs: 3.6 10*3/uL (ref 1.4–7.7)
Neutrophils Relative %: 57 % (ref 43.0–77.0)
PLATELETS: 289 10*3/uL (ref 150.0–400.0)
RBC: 5.1 Mil/uL (ref 3.87–5.11)
RDW: 13 % (ref 11.5–15.5)
WBC: 6.3 10*3/uL (ref 4.0–10.5)

## 2014-06-19 LAB — HEPATIC FUNCTION PANEL
ALT: 22 U/L (ref 0–35)
AST: 23 U/L (ref 0–37)
Albumin: 4.4 g/dL (ref 3.5–5.2)
Alkaline Phosphatase: 111 U/L (ref 39–117)
Bilirubin, Direct: 0 mg/dL (ref 0.0–0.3)
Total Bilirubin: 0.6 mg/dL (ref 0.2–1.2)
Total Protein: 7.3 g/dL (ref 6.0–8.3)

## 2014-06-19 LAB — POCT URINALYSIS DIPSTICK
Bilirubin, UA: NEGATIVE
Blood, UA: NEGATIVE
Glucose, UA: NEGATIVE
KETONES UA: NEGATIVE
Leukocytes, UA: NEGATIVE
NITRITE UA: NEGATIVE
PH UA: 5.5
Protein, UA: NEGATIVE
Spec Grav, UA: 1.01
Urobilinogen, UA: 0.2

## 2014-06-19 LAB — LIPID PANEL
CHOL/HDL RATIO: 5
Cholesterol: 237 mg/dL — ABNORMAL HIGH (ref 0–200)
HDL: 49.8 mg/dL (ref >39.00)
NONHDL: 187.2
Triglycerides: 203 mg/dL — ABNORMAL HIGH (ref 0.0–149.0)
VLDL: 40.6 mg/dL — AB (ref 0.0–40.0)

## 2014-06-19 LAB — BASIC METABOLIC PANEL
BUN: 18 mg/dL (ref 6–23)
CO2: 26 mEq/L (ref 19–32)
Calcium: 9.4 mg/dL (ref 8.4–10.5)
Chloride: 100 mEq/L (ref 96–112)
Creatinine, Ser: 0.8 mg/dL (ref 0.4–1.2)
GFR: 79.79 mL/min (ref >60.00)
Glucose, Bld: 103 mg/dL — ABNORMAL HIGH (ref 70–99)
Potassium: 3.1 mEq/L — ABNORMAL LOW (ref 3.5–5.1)
SODIUM: 140 meq/L (ref 135–145)

## 2014-06-19 LAB — TSH: TSH: 3.35 u[IU]/mL (ref 0.35–4.50)

## 2014-06-20 ENCOUNTER — Ambulatory Visit (HOSPITAL_COMMUNITY)
Admission: RE | Admit: 2014-06-20 | Discharge: 2014-06-20 | Disposition: A | Discharge status: Home / Self Care | Payer: Medicare Other | Source: Ambulatory Visit | Attending: Family Medicine | Admitting: Family Medicine

## 2014-06-20 DIAGNOSIS — Z1231 Encounter for screening mammogram for malignant neoplasm of breast: Secondary | ICD-10-CM | POA: Insufficient documentation

## 2014-06-21 LAB — LDL CHOLESTEROL, DIRECT: LDL DIRECT: 158.7 mg/dL

## 2014-06-26 ENCOUNTER — Encounter: Payer: Self-pay | Admitting: Family Medicine

## 2014-06-26 ENCOUNTER — Ambulatory Visit (INDEPENDENT_AMBULATORY_CARE_PROVIDER_SITE_OTHER): Payer: Medicare Other | Admitting: Family Medicine

## 2014-06-26 VITALS — BP 120/80 | Temp 98.0°F | Ht 59.75 in | Wt 125.0 lb

## 2014-06-26 DIAGNOSIS — I10 Essential (primary) hypertension: Secondary | ICD-10-CM

## 2014-06-26 DIAGNOSIS — E039 Hypothyroidism, unspecified: Secondary | ICD-10-CM

## 2014-06-26 DIAGNOSIS — F909 Attention-deficit hyperactivity disorder, unspecified type: Secondary | ICD-10-CM

## 2014-06-26 DIAGNOSIS — F988 Other specified behavioral and emotional disorders with onset usually occurring in childhood and adolescence: Secondary | ICD-10-CM

## 2014-06-26 DIAGNOSIS — I872 Venous insufficiency (chronic) (peripheral): Secondary | ICD-10-CM

## 2014-06-26 MED ORDER — AMPHETAMINE-DEXTROAMPHETAMINE 30 MG PO TABS: 30.0000 mg | ORAL_TABLET | Freq: Every day | ORAL | Status: DC

## 2014-06-26 MED ORDER — HYDROCHLOROTHIAZIDE 25 MG PO TABS: ORAL_TABLET | ORAL | Status: DC

## 2014-06-26 MED ORDER — SYNTHROID 100 MCG PO TABS: ORAL_TABLET | ORAL | Status: DC

## 2014-06-26 NOTE — Patient Instructions (Signed)
Continue current medications  Follow-up in 1 year sooner if any problems  Remember those sunscreens SPF 50+

## 2014-06-26 NOTE — Progress Notes (Signed)
   Subjective:    Patient ID: Gabrielle Vargas, female    DOB: 05-24-1951, 63 y.o.   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 TSH level normal  She takes Hydrocort thiazide 25 mg daily because of history of mild hypertension BP 120/80  She takes Adderall 30 mg daily because of a history of adult ADD  She gets routine eye care, dental care,,,,,, formally a dental hygienist,,,,,,, BSE monthly, annual mammography, colonoscopy 2015 normal  Cognitive function normal she walks daily home health safety reviewed no issues identified, no guns in the house, she does have a healthcare power of attorney and living well   Review of Systems  Constitutional: Negative.   HENT: Negative.   Eyes: Negative.   Respiratory: Negative.   Cardiovascular: Negative.   Gastrointestinal: Negative.   Endocrine: Negative.   Genitourinary: Negative.   Musculoskeletal: Negative.   Skin: Negative.   Allergic/Immunologic: Negative.   Neurological: Negative.   Hematological: Negative.   Psychiatric/Behavioral: Negative.        Objective:   Physical Exam  Constitutional: She appears well-developed and well-nourished.  HENT:  Head: Normocephalic and atraumatic.  Right Ear: External ear normal.  Left Ear: External ear normal.  Nose: Nose normal.  Mouth/Throat: Oropharynx is clear and moist.  Eyes: EOM are normal. Pupils are equal, round, and reactive to light.  Neck: Normal range of motion. Neck supple. No JVD present. No tracheal deviation present. No thyromegaly present.  Cardiovascular: Normal rate, regular rhythm, normal heart sounds and intact distal pulses.  Exam reveals no gallop and no friction rub.   No murmur heard. Pulmonary/Chest: Effort normal and breath sounds normal. No stridor. No respiratory distress. She  has no wheezes. She has no rales. She exhibits no tenderness.  Abdominal: Soft. Bowel sounds are normal. She exhibits no distension and no mass. There is no tenderness. There is no rebound and no guarding.  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 Ovaries were left intact. She was on 0.3 of Premarin for many years she's taper that off about 9 months ago. Asymptomatic therefore pelvic exam not indicated  Musculoskeletal: Normal range of motion.  Lymphadenopathy:    She has no cervical adenopathy.  Neurological: She is alert. She has normal reflexes. No cranial nerve deficit. She exhibits normal muscle tone. Coordination normal.  Skin: Skin is warm and dry. No rash noted. No erythema. No pallor.  Total body skin exam normal except for scar in her right shoulder from previous shoulder surgery, scar on the left side of her neck from previous cervical disc surgery. Multiple freckles mole's all appear benign. She does have light skin and light eyes  Psychiatric: She has a normal mood and affect. Her behavior is normal. Judgment and thought content normal.          Assessment & Plan:  Healthy female  Hypertension ago continue current therapy  Hypothyroidism....... continue current therapy  Adult ADD.....Marland Kitchen continue Adderall.

## 2014-06-27 ENCOUNTER — Telehealth: Payer: Self-pay | Admitting: Family Medicine

## 2014-06-27 NOTE — Telephone Encounter (Signed)
emmi emailed °

## 2014-08-11 ENCOUNTER — Other Ambulatory Visit: Payer: Self-pay | Admitting: Family Medicine

## 2014-12-22 ENCOUNTER — Other Ambulatory Visit: Payer: Self-pay | Admitting: Family Medicine

## 2014-12-22 DIAGNOSIS — F988 Other specified behavioral and emotional disorders with onset usually occurring in childhood and adolescence: Secondary | ICD-10-CM

## 2014-12-22 NOTE — Telephone Encounter (Signed)
° ° ° ° °

## 2014-12-23 MED ORDER — AMPHETAMINE-DEXTROAMPHETAMINE 30 MG PO TABS: 30.0000 mg | ORAL_TABLET | Freq: Every day | ORAL | Status: DC

## 2014-12-23 NOTE — Telephone Encounter (Signed)
Rx printed and given to Dr. Sherren Mocha for a signature

## 2014-12-24 NOTE — Telephone Encounter (Signed)
Rx ready for pick up and patient is aware 

## 2015-01-27 ENCOUNTER — Other Ambulatory Visit: Payer: Self-pay | Admitting: Dermatology

## 2015-04-10 ENCOUNTER — Other Ambulatory Visit: Payer: Self-pay | Admitting: Family Medicine

## 2015-04-10 DIAGNOSIS — F988 Other specified behavioral and emotional disorders with onset usually occurring in childhood and adolescence: Secondary | ICD-10-CM

## 2015-04-10 NOTE — Telephone Encounter (Signed)
Pt request refill amphetamine-dextroamphetamine (ADDERALL) 30 MG tablet °3 mo supply °

## 2015-04-13 MED ORDER — AMPHETAMINE-DEXTROAMPHETAMINE 30 MG PO TABS: 30.0000 mg | ORAL_TABLET | Freq: Every day | ORAL | Status: DC

## 2015-04-13 MED ORDER — AMPHETAMINE-DEXTROAMPHETAMINE 30 MG PO TABS
30.0000 mg | ORAL_TABLET | Freq: Every day | ORAL | Status: DC
Start: 2015-04-13 — End: 2015-08-07

## 2015-04-13 NOTE — Telephone Encounter (Signed)
Pt.notified

## 2015-04-13 NOTE — Telephone Encounter (Signed)
Per MD Sherren Mocha -- okay 30m/os supply .

## 2015-07-07 ENCOUNTER — Other Ambulatory Visit: Payer: Self-pay | Admitting: Family Medicine

## 2015-07-19 HISTORY — PX: MENISCUS REPAIR: SHX5179

## 2015-08-06 ENCOUNTER — Telehealth: Payer: Self-pay | Admitting: Family Medicine

## 2015-08-06 DIAGNOSIS — F988 Other specified behavioral and emotional disorders with onset usually occurring in childhood and adolescence: Secondary | ICD-10-CM

## 2015-08-06 NOTE — Telephone Encounter (Signed)
Pt needs new rx generic adderall 30 mg. Pt needs a refill on synthroid [facility]. Pt has one pill left

## 2015-08-07 MED ORDER — AMPHETAMINE-DEXTROAMPHETAMINE 30 MG PO TABS: 30.0000 mg | ORAL_TABLET | Freq: Every day | ORAL | Status: DC

## 2015-08-07 MED ORDER — SYNTHROID 100 MCG PO TABS: 100.0000 ug | ORAL_TABLET | Freq: Every day | ORAL | Status: DC

## 2015-08-07 NOTE — Telephone Encounter (Signed)
Rx ready for pick up and patient is aware 

## 2015-08-11 ENCOUNTER — Other Ambulatory Visit (INDEPENDENT_AMBULATORY_CARE_PROVIDER_SITE_OTHER): Payer: PPO

## 2015-08-11 DIAGNOSIS — Z0000 Encounter for general adult medical examination without abnormal findings: Secondary | ICD-10-CM

## 2015-08-11 LAB — BASIC METABOLIC PANEL
BUN: 20 mg/dL (ref 6–23)
CALCIUM: 9.7 mg/dL (ref 8.4–10.5)
CHLORIDE: 98 meq/L (ref 96–112)
CO2: 30 mEq/L (ref 19–32)
Creatinine, Ser: 0.74 mg/dL (ref 0.40–1.20)
GFR: 83.24 mL/min (ref >60.00)
GLUCOSE: 100 mg/dL — AB (ref 70–99)
Potassium: 3.6 mEq/L (ref 3.5–5.1)
Sodium: 138 mEq/L (ref 135–145)

## 2015-08-11 LAB — CBC WITH DIFFERENTIAL/PLATELET
BASOS ABS: 0 10*3/uL (ref 0.0–0.1)
BASOS PCT: 0.5 % (ref 0.0–3.0)
Eosinophils Absolute: 0.1 10*3/uL (ref 0.0–0.7)
Eosinophils Relative: 1.4 % (ref 0.0–5.0)
HCT: 43 % (ref 36.0–46.0)
Hemoglobin: 14.3 g/dL (ref 12.0–15.0)
LYMPHS ABS: 2.2 10*3/uL (ref 0.7–4.0)
Lymphocytes Relative: 27.1 % (ref 12.0–46.0)
MCHC: 33.3 g/dL (ref 30.0–36.0)
MCV: 86.8 fl (ref 78.0–100.0)
MONOS PCT: 5.2 % (ref 3.0–12.0)
Monocytes Absolute: 0.4 10*3/uL (ref 0.1–1.0)
NEUTROS PCT: 65.8 % (ref 43.0–77.0)
Neutro Abs: 5.4 10*3/uL (ref 1.4–7.7)
PLATELETS: 261 10*3/uL (ref 150.0–400.0)
RBC: 4.95 Mil/uL (ref 3.87–5.11)
RDW: 13.6 % (ref 11.5–15.5)
WBC: 8.2 10*3/uL (ref 4.0–10.5)

## 2015-08-11 LAB — LIPID PANEL
Cholesterol: 242 mg/dL — ABNORMAL HIGH (ref 0–200)
HDL: 61.2 mg/dL (ref >39.00)
LDL Cholesterol: 141 mg/dL — ABNORMAL HIGH (ref 0–99)
NonHDL: 181.21
Total CHOL/HDL Ratio: 4
Triglycerides: 200 mg/dL — ABNORMAL HIGH (ref 0.0–149.0)
VLDL: 40 mg/dL (ref 0.0–40.0)

## 2015-08-11 LAB — POCT URINALYSIS DIPSTICK
Bilirubin, UA: NEGATIVE
Glucose, UA: NEGATIVE
Ketones, UA: NEGATIVE
Leukocytes, UA: NEGATIVE
Nitrite, UA: NEGATIVE
PROTEIN UA: NEGATIVE
RBC UA: NEGATIVE
SPEC GRAV UA: 1.015
UROBILINOGEN UA: 0.2
pH, UA: 6.5

## 2015-08-11 LAB — HEPATIC FUNCTION PANEL
ALBUMIN: 4.4 g/dL (ref 3.5–5.2)
ALK PHOS: 112 U/L (ref 39–117)
ALT: 19 U/L (ref 0–35)
AST: 18 U/L (ref 0–37)
Bilirubin, Direct: 0 mg/dL (ref 0.0–0.3)
TOTAL PROTEIN: 7.3 g/dL (ref 6.0–8.3)
Total Bilirubin: 0.5 mg/dL (ref 0.2–1.2)

## 2015-08-11 LAB — TSH: TSH: 2.38 u[IU]/mL (ref 0.35–4.50)

## 2015-08-18 ENCOUNTER — Encounter: Payer: Self-pay | Admitting: Family Medicine

## 2015-08-18 ENCOUNTER — Ambulatory Visit (INDEPENDENT_AMBULATORY_CARE_PROVIDER_SITE_OTHER): Payer: PPO | Admitting: Family Medicine

## 2015-08-18 ENCOUNTER — Other Ambulatory Visit: Payer: Self-pay

## 2015-08-18 VITALS — BP 120/80 | Temp 98.7°F | Ht 58.75 in | Wt 123.0 lb

## 2015-08-18 DIAGNOSIS — Z23 Encounter for immunization: Secondary | ICD-10-CM | POA: Diagnosis not present

## 2015-08-18 DIAGNOSIS — Z1231 Encounter for screening mammogram for malignant neoplasm of breast: Secondary | ICD-10-CM

## 2015-08-18 DIAGNOSIS — E039 Hypothyroidism, unspecified: Secondary | ICD-10-CM

## 2015-08-18 DIAGNOSIS — F909 Attention-deficit hyperactivity disorder, unspecified type: Secondary | ICD-10-CM

## 2015-08-18 DIAGNOSIS — Z0000 Encounter for general adult medical examination without abnormal findings: Secondary | ICD-10-CM | POA: Diagnosis not present

## 2015-08-18 DIAGNOSIS — I10 Essential (primary) hypertension: Secondary | ICD-10-CM

## 2015-08-18 DIAGNOSIS — F988 Other specified behavioral and emotional disorders with onset usually occurring in childhood and adolescence: Secondary | ICD-10-CM

## 2015-08-18 MED ORDER — SYNTHROID 100 MCG PO TABS: ORAL_TABLET | ORAL | Status: DC

## 2015-08-18 MED ORDER — HYDROCHLOROTHIAZIDE 25 MG PO TABS: 25.0000 mg | ORAL_TABLET | Freq: Every day | ORAL | Status: DC

## 2015-08-18 MED ORDER — AMPHETAMINE-DEXTROAMPHETAMINE 20 MG PO TABS: ORAL_TABLET | ORAL | Status: DC

## 2015-08-18 NOTE — Progress Notes (Signed)
   Subjective:    Patient ID: Gabrielle Vargas, female    DOB: 09/22/1950, 65 y.o.   MRN: [id number]  HPI April is a 65 year old married female nonsmoker who comes in today for general physical examination  She gets routine eye care, dental care, BSE monthly, and you mammography, colonoscopy 2015 was normal.  She is due a Pneumovax 23. She's up on vaccinations. She had a shingles vaccine 2016. She's due for a tetanus booster.  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 Ovaries were left intact. No symptoms of abdominal bloating change in bowel habits etc. etc.  She takes Adderall 30 mg daily for adult ADD. We discussed decreasing the dose she was willing to try  She takes Hydrocort thiazide 25 mg for hypertension BP 120/80  She takes Synthroid 100 g daily because of a history of hypothyroidism TSH levels 2.38 which is normal.  She sees a dermatologist on a regular basis. He removed the lesion from her left shoulder last August and it was benign   Review of Systems  Constitutional: Negative.   HENT: Negative.   Eyes: Negative.   Respiratory: Negative.   Cardiovascular: Negative.   Gastrointestinal: Negative.   Endocrine: Negative.   Genitourinary: Negative.   Musculoskeletal: Negative.   Skin: Negative.   Allergic/Immunologic: Negative.   Neurological: Negative.   Hematological: Negative.   Psychiatric/Behavioral: Negative.        Objective:   Physical Exam  Constitutional: She appears well-developed and well-nourished.  HENT:  Head: Normocephalic and atraumatic.  Right Ear: External ear normal.  Left Ear: External ear normal.  Nose: Nose normal.  Mouth/Throat: Oropharynx is clear and moist.  Eyes: EOM are normal. Pupils are equal, round, and reactive to light.  Neck: Normal range of motion. Neck supple. No JVD present. No tracheal deviation present. No thyromegaly present.  Cardiovascular: Normal rate, regular rhythm, normal heart sounds and  intact distal pulses.  Exam reveals no gallop and no friction rub.   No murmur heard. Pulmonary/Chest: Effort normal and breath sounds normal. No stridor. No respiratory distress. She has no wheezes. She has no rales. She exhibits no tenderness.  Abdominal: Soft. Bowel sounds are normal. She exhibits no distension and no mass. There is no tenderness. There is no rebound and no guarding.  Genitourinary:  Bilateral breast exam negative she's had a reduction mammoplasty  Musculoskeletal: Normal range of motion.  Lymphadenopathy:    She has no cervical adenopathy.  Neurological: She is alert. She has normal reflexes. No cranial nerve deficit. She exhibits normal muscle tone. Coordination normal.  Skin: Skin is warm and dry. No rash noted. No erythema. No pallor.  Total body skin exam normal  Psychiatric: She has a normal mood and affect. Her behavior is normal. Judgment and thought content normal.  Nursing note and vitals reviewed.         Assessment & Plan:  Healthy female  Adult ADD........ taper Adderall  Moderate hypertension.....Marland Kitchen continue Hydrocort thiazide 25 mg daily  Hypothyroidism,,,,,,,,, continue Synthroid 100 g daily.

## 2015-08-18 NOTE — Patient Instructions (Signed)
Continue current medications  After you finish the current 30 mg tabs switch to the 20 mg tabs 1 daily............ call 3 weeks after starting in new prescription,,,,,,,,,, Rachel's extension is [phone]....... so we can determine if indeed we need to continue to taper or whatever  Return in one year sooner for any problems

## 2015-08-18 NOTE — Progress Notes (Signed)
Pre visit review using our clinic review tool, if applicable. No additional management support is needed unless otherwise documented below in the visit note. 

## 2015-09-07 ENCOUNTER — Ambulatory Visit
Admission: RE | Admit: 2015-09-07 | Discharge: 2015-09-07 | Disposition: A | Discharge status: Home / Self Care | Payer: PPO | Source: Ambulatory Visit

## 2015-09-07 DIAGNOSIS — Z1231 Encounter for screening mammogram for malignant neoplasm of breast: Secondary | ICD-10-CM

## 2016-01-21 ENCOUNTER — Other Ambulatory Visit: Payer: Self-pay | Admitting: Family Medicine

## 2016-01-21 DIAGNOSIS — F988 Other specified behavioral and emotional disorders with onset usually occurring in childhood and adolescence: Secondary | ICD-10-CM

## 2016-01-21 NOTE — Telephone Encounter (Signed)
Pt need new Rx for Adderall 20 mg #60

## 2016-01-22 MED ORDER — AMPHETAMINE-DEXTROAMPHETAMINE 30 MG PO TABS: 30.0000 mg | ORAL_TABLET | Freq: Every day | ORAL | Status: DC

## 2016-01-22 NOTE — Telephone Encounter (Signed)
Pt returning your call

## 2016-01-22 NOTE — Telephone Encounter (Signed)
Left message on voicemail to call office.  

## 2016-01-22 NOTE — Telephone Encounter (Signed)
Spoke to pt, asked her if she is taking 30 mg or 20 mg? Pt said she was on 20 last but would prefer to have 30 mg it worked better. Told her okay I will have Rx's ready in a bit to pickup. Pt verbalized understanding. Rx's printed and signed by Dr.K.

## 2016-01-27 ENCOUNTER — Ambulatory Visit (INDEPENDENT_AMBULATORY_CARE_PROVIDER_SITE_OTHER): Payer: PPO | Admitting: *Deleted

## 2016-01-27 DIAGNOSIS — Z23 Encounter for immunization: Secondary | ICD-10-CM

## 2016-02-15 DIAGNOSIS — M1811 Unilateral primary osteoarthritis of first carpometacarpal joint, right hand: Secondary | ICD-10-CM | POA: Diagnosis not present

## 2016-02-15 DIAGNOSIS — M67441 Ganglion, right hand: Secondary | ICD-10-CM | POA: Diagnosis not present

## 2016-03-02 DIAGNOSIS — D2111 Benign neoplasm of connective and other soft tissue of right upper limb, including shoulder: Secondary | ICD-10-CM | POA: Diagnosis not present

## 2016-05-24 ENCOUNTER — Telehealth: Payer: Self-pay | Admitting: Family Medicine

## 2016-05-24 ENCOUNTER — Other Ambulatory Visit: Payer: Self-pay | Admitting: Emergency Medicine

## 2016-05-24 MED ORDER — AMPHETAMINE-DEXTROAMPHETAMINE 30 MG PO TABS: 30.0000 mg | ORAL_TABLET | Freq: Every day | ORAL | 0 refills | Status: DC

## 2016-05-24 NOTE — Telephone Encounter (Signed)
Called and spoke with pt informing pt that prescription has been printed and up front ready for pick up.

## 2016-05-24 NOTE — Telephone Encounter (Signed)
Pt request refill amphetamine-dextroamphetamine (ADDERALL) 30 MG tablet °3 mo supply °

## 2016-07-28 ENCOUNTER — Encounter: Payer: Self-pay | Admitting: Family Medicine

## 2016-07-28 ENCOUNTER — Ambulatory Visit (INDEPENDENT_AMBULATORY_CARE_PROVIDER_SITE_OTHER): Payer: PPO | Admitting: Family Medicine

## 2016-07-28 DIAGNOSIS — H00012 Hordeolum externum right lower eyelid: Secondary | ICD-10-CM | POA: Diagnosis not present

## 2016-07-28 DIAGNOSIS — H00019 Hordeolum externum unspecified eye, unspecified eyelid: Secondary | ICD-10-CM | POA: Insufficient documentation

## 2016-07-28 MED ORDER — ERYTHROMYCIN 5 MG/GM OP OINT: 1.0000 "application " | TOPICAL_OINTMENT | Freq: Three times a day (TID) | OPHTHALMIC | 0 refills | Status: DC

## 2016-07-28 NOTE — Progress Notes (Signed)
Gabrielle Vargas is a 66 y.o. female here for a new problem.  History of Present Illness:   1. Right lower lid pain, associated with red bump, x 5 days. She has done nothing for treatment. No Hx of the same. No change to medications or soaps. No vision or eye changes.   PMHx, SurgHx, SocialHx, Medications, and Allergies were reviewed in the Visit Navigator and updated as appropriate.  Current Medications:   Prior to Admission medications   Medication Sig Start Date End Date Taking? Authorizing Provider  amphetamine-dextroamphetamine (ADDERALL) 30 MG tablet Take 1 tablet by mouth daily. 05/24/16  Yes Dorena Cookey, MD  aspirin 81 MG tablet Take 81 mg by mouth daily.   Yes Historical Provider, MD  hydrochlorothiazide (HYDRODIURIL) 25 MG tablet Take 1 tablet (25 mg total) by mouth daily. 08/18/15  Yes Dorena Cookey, MD  SYNTHROID 100 MCG tablet TAKE 1 TABLET BY MOUTH DAILY 08/18/15  Yes Dorena Cookey, MD    Review of Systems:   Constitutional: Negative for fever, chills and unexpected weight change.  HEENT: Negative for ear discharge, ear pain, mouth sores, sore throat, tinnitus and trouble swallowing.  No eye pain. LUNGS: Negative for cough, choking, chest tightness, shortness of breath and wheezing.   CV: Negative for chest pain, palpitations and leg swelling.   Vitals:   Vitals:   07/28/16 1018  BP: (!) 142/82  Pulse: 81  Temp: 98.3 F (36.8 C)  TempSrc: Oral  SpO2: 94%  Weight: 119 lb (54 kg)  Height: 4' 10.75" (1.492 m)     Body mass index is 24.24 kg/m.  Physical Exam:   General appearance: alert, cooperative and appears stated age Head: normal, normocephalic, atraumatic Eye: positive findings: eyelids/periorbital: external hordeolum on the right Resp: clear to auscultation bilaterally Cardio: regular rate and rhythm, S1, S2 normal, no murmur, click, rub or gallop Extremities: extremities normal, atraumatic, no cyanosis or edema    Assessment and Plan:     Gabrielle Vargas was seen today for acute visit.  Diagnoses and all orders for this visit:  Hordeolum externum of right lower eyelid -     erythromycin ophthalmic ointment; Place 1 application into the right eye 3 (three) times daily. - Warm compress to eyelid QID for 15 minutes. Eyelid hygiene and precautions reviewed.  . Reviewed expectations re: course of current medical issues. . Discussed self-management of symptoms. . Outlined signs and symptoms indicating need for more acute intervention. . Patient verbalized understanding and all questions were answered. . See orders for this visit as documented in the electronic medical record. . Patient received an After-Visit Summary.   Briscoe Deutscher, D.O.

## 2016-09-02 ENCOUNTER — Other Ambulatory Visit: Payer: Self-pay | Admitting: Family Medicine

## 2016-09-02 DIAGNOSIS — E039 Hypothyroidism, unspecified: Secondary | ICD-10-CM

## 2016-09-07 ENCOUNTER — Other Ambulatory Visit (INDEPENDENT_AMBULATORY_CARE_PROVIDER_SITE_OTHER): Payer: PPO

## 2016-09-07 DIAGNOSIS — Z0000 Encounter for general adult medical examination without abnormal findings: Secondary | ICD-10-CM | POA: Diagnosis not present

## 2016-09-07 LAB — POC URINALSYSI DIPSTICK (AUTOMATED)
Bilirubin, UA: NEGATIVE
Blood, UA: NEGATIVE
GLUCOSE UA: NEGATIVE
Ketones, UA: NEGATIVE
Leukocytes, UA: NEGATIVE
NITRITE UA: NEGATIVE
PROTEIN UA: NEGATIVE
SPEC GRAV UA: 1.02
UROBILINOGEN UA: 0.2
pH, UA: 5.5

## 2016-09-07 LAB — CBC WITH DIFFERENTIAL/PLATELET
BASOS PCT: 1 % (ref 0.0–3.0)
Basophils Absolute: 0.1 10*3/uL (ref 0.0–0.1)
EOS ABS: 0.1 10*3/uL (ref 0.0–0.7)
EOS PCT: 2.4 % (ref 0.0–5.0)
HEMATOCRIT: 38.2 % (ref 36.0–46.0)
HEMOGLOBIN: 13.2 g/dL (ref 12.0–15.0)
LYMPHS PCT: 39.9 % (ref 12.0–46.0)
Lymphs Abs: 2 10*3/uL (ref 0.7–4.0)
MCHC: 34.6 g/dL (ref 30.0–36.0)
MCV: 86 fl (ref 78.0–100.0)
Monocytes Absolute: 0.3 10*3/uL (ref 0.1–1.0)
Monocytes Relative: 6.1 % (ref 3.0–12.0)
NEUTROS ABS: 2.6 10*3/uL (ref 1.4–7.7)
Neutrophils Relative %: 50.6 % (ref 43.0–77.0)
PLATELETS: 272 10*3/uL (ref 150.0–400.0)
RBC: 4.45 Mil/uL (ref 3.87–5.11)
RDW: 13.8 % (ref 11.5–15.5)
WBC: 5.1 10*3/uL (ref 4.0–10.5)

## 2016-09-07 LAB — LIPID PANEL
CHOLESTEROL: 264 mg/dL — AB (ref 0–200)
HDL: 63.6 mg/dL (ref >39.00)
LDL Cholesterol: 162 mg/dL — ABNORMAL HIGH (ref 0–99)
NonHDL: 200.08
TRIGLYCERIDES: 190 mg/dL — AB (ref 0.0–149.0)
Total CHOL/HDL Ratio: 4
VLDL: 38 mg/dL (ref 0.0–40.0)

## 2016-09-07 LAB — HEPATIC FUNCTION PANEL
ALBUMIN: 4.6 g/dL (ref 3.5–5.2)
ALT: 16 U/L (ref 0–35)
AST: 18 U/L (ref 0–37)
Alkaline Phosphatase: 109 U/L (ref 39–117)
BILIRUBIN DIRECT: 0.1 mg/dL (ref 0.0–0.3)
Total Bilirubin: 0.4 mg/dL (ref 0.2–1.2)
Total Protein: 7 g/dL (ref 6.0–8.3)

## 2016-09-07 LAB — BASIC METABOLIC PANEL
BUN: 19 mg/dL (ref 6–23)
CHLORIDE: 102 meq/L (ref 96–112)
CO2: 31 meq/L (ref 19–32)
Calcium: 9.8 mg/dL (ref 8.4–10.5)
Creatinine, Ser: 0.75 mg/dL (ref 0.40–1.20)
GFR: 81.69 mL/min (ref >60.00)
Glucose, Bld: 97 mg/dL (ref 70–99)
POTASSIUM: 3.6 meq/L (ref 3.5–5.1)
Sodium: 139 mEq/L (ref 135–145)

## 2016-09-07 LAB — TSH: TSH: 1.52 u[IU]/mL (ref 0.35–4.50)

## 2016-09-14 ENCOUNTER — Encounter: Payer: PPO | Admitting: Family Medicine

## 2016-09-19 ENCOUNTER — Ambulatory Visit (INDEPENDENT_AMBULATORY_CARE_PROVIDER_SITE_OTHER): Payer: PPO | Admitting: Family Medicine

## 2016-09-19 ENCOUNTER — Encounter: Payer: Self-pay | Admitting: Family Medicine

## 2016-09-19 VITALS — BP 120/80 | HR 92 | Temp 98.5°F | Resp 18 | Ht <= 58 in | Wt 119.9 lb

## 2016-09-19 DIAGNOSIS — Z0000 Encounter for general adult medical examination without abnormal findings: Secondary | ICD-10-CM

## 2016-09-19 DIAGNOSIS — F909 Attention-deficit hyperactivity disorder, unspecified type: Secondary | ICD-10-CM | POA: Diagnosis not present

## 2016-09-19 DIAGNOSIS — I10 Essential (primary) hypertension: Secondary | ICD-10-CM

## 2016-09-19 DIAGNOSIS — E039 Hypothyroidism, unspecified: Secondary | ICD-10-CM | POA: Diagnosis not present

## 2016-09-19 MED ORDER — SYNTHROID 100 MCG PO TABS: 100.0000 ug | ORAL_TABLET | Freq: Every day | ORAL | 4 refills | Status: DC

## 2016-09-19 MED ORDER — AMPHETAMINE-DEXTROAMPHETAMINE 20 MG PO TABS: 20.0000 mg | ORAL_TABLET | Freq: Two times a day (BID) | ORAL | 0 refills | Status: DC

## 2016-09-19 MED ORDER — HYDROCHLOROTHIAZIDE 25 MG PO TABS: ORAL_TABLET | ORAL | 4 refills | Status: DC

## 2016-09-19 NOTE — Patient Instructions (Signed)
Decrease the Adderall to 20 mg daily  Call 2 weeks from being out of your second prescription for feedback  Continue other medicines  Follow-up in one year sooner if any problems  Motrin 400 mg twice daily with food when your joints flareup  Call and get your mammogram the spring

## 2016-09-19 NOTE — Progress Notes (Signed)
Eladia is a 66 year old female,,,,,, retired Copywriter, advertising,,,, who comes in today for general physical examination because of a history of hypothyroidism and mild hypertension  Her hypothyroidism is treated with Synthroid 100 mg daily. TSH level is 1.5 to  Her blood pressures been treated with had a core thiazide 25 mg daily BP 120/80  She has a history of adult ADD and takes Adderall 30 mg daily  She gets routine eye care, dental care, colonoscopy 2015 was normal. Last mammogram 2017.  She says the proximal joints of her hands and her Dahms bother off-and-on for the past 6 months. It'll get red hot or swollen gets stiff and sore. No history of trauma. She also has some difficulty with her left knee. 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  14 point review of systems reviewed and otherwise negative  Vaccinations up-to-date  She's a retired Copywriter, advertising. She and her husband live in the [place] area. 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 She had her uterus removed for nonmalignant reasons years ago. Ovaries were left intact she is asymptomatic. Extremities normal skin normal peripheral pulses normal. Recently had a lesion removed from her left shoulder by her dermatologist it proved to be benign. It had changed in color. She has light skin and light eyes and her mother died of melanoma so she is very conscious about her skin.  Healthy female  #2 hypothyroidism continue Synthroid 100 g daily  #3 mild hypertension.... Continue had  a core thiazide 25 mg daily  Number for adult ADD.......... trial of decreasing Adderall begin by going from 30 mg daily to 20.

## 2016-09-19 NOTE — Progress Notes (Signed)
Pre visit review using our clinic review tool, if applicable. No additional management support is needed unless otherwise documented below in the visit note. 

## 2016-12-21 ENCOUNTER — Telehealth: Payer: Self-pay | Admitting: Family Medicine

## 2016-12-21 NOTE — Telephone Encounter (Signed)
Pt request refill of the following: amphetamine-dextroamphetamine (ADDERALL) 20 MG tablet ° ° °Phamacy: ° °

## 2016-12-21 NOTE — Telephone Encounter (Signed)
Gabrielle Vargas patient

## 2016-12-21 NOTE — Telephone Encounter (Signed)
Yes thanks, may fill for 3 months

## 2016-12-26 ENCOUNTER — Other Ambulatory Visit: Payer: Self-pay

## 2016-12-26 MED ORDER — AMPHETAMINE-DEXTROAMPHETAMINE 20 MG PO TABS: 20.0000 mg | ORAL_TABLET | Freq: Two times a day (BID) | ORAL | 0 refills | Status: DC

## 2016-12-26 MED ORDER — AMPHETAMINE-DEXTROAMPHET ER 20 MG PO CP24: 20.0000 mg | ORAL_CAPSULE | Freq: Two times a day (BID) | ORAL | 0 refills | Status: DC

## 2016-12-26 MED ORDER — AMPHETAMINE-DEXTROAMPHETAMINE 20 MG PO TABS: 20.0000 mg | ORAL_TABLET | Freq: Every day | ORAL | 0 refills | Status: DC

## 2016-12-26 NOTE — Telephone Encounter (Signed)
Prescriptions prepared and placed up front for pick up. I called patient and she is aware.

## 2017-01-11 ENCOUNTER — Ambulatory Visit (INDEPENDENT_AMBULATORY_CARE_PROVIDER_SITE_OTHER): Payer: PPO | Admitting: Gastroenterology

## 2017-01-11 ENCOUNTER — Encounter (INDEPENDENT_AMBULATORY_CARE_PROVIDER_SITE_OTHER): Payer: Self-pay

## 2017-01-11 ENCOUNTER — Encounter: Payer: Self-pay | Admitting: Gastroenterology

## 2017-01-11 VITALS — BP 150/80 | HR 88 | Ht 58.5 in | Wt 118.5 lb

## 2017-01-11 DIAGNOSIS — R1030 Lower abdominal pain, unspecified: Secondary | ICD-10-CM

## 2017-01-11 DIAGNOSIS — K5900 Constipation, unspecified: Secondary | ICD-10-CM

## 2017-01-11 MED ORDER — POLYETHYLENE GLYCOL 3350 17 GM/SCOOP PO POWD: 1.0000 | Freq: Every day | ORAL | 3 refills | Status: DC | PRN

## 2017-01-11 NOTE — Patient Instructions (Signed)
Please take over the counter Miralax once a day as needed.  Please call our office in 2 weeks if your symptoms have not resolved.   Thank you for choosing me and [facility].  Pricilla Riffle. Dagoberto Ligas., MD., Marval Regal

## 2017-01-11 NOTE — Progress Notes (Signed)
    History of Present Illness: This is 32 old female who relates the onset of lower abdominal discomfort and diarrhea for 1-2 days. Since her diarrhea resolves she has had problems with constipation and lower abdominal discomfort she began taking Colace daily which slightly improved symptoms however she still notes a fullness in her lower abdomen, mild constipation and a sense of incomplete evacuation.   Colonoscopy 09/2013: moderate sigmoid colon diverticulosis, otherwise normal  Current Medications, Allergies, Past Medical History, Past Surgical History, Family History and Social History were reviewed in [employer] record.  Physical Exam: General: Well developed, well nourished, no acute distress Head: Normocephalic and atraumatic Eyes:  sclerae anicteric, EOMI Ears: Normal auditory acuity Mouth: No deformity or lesions Lungs: Clear throughout to auscultation Heart: Regular rate and rhythm; no murmurs, rubs or bruits Abdomen: Soft, mild lower abdominal tenderness and non distended. No masses, hepatosplenomegaly or hernias noted. Normal Bowel sounds Musculoskeletal: Symmetrical with no gross deformities  Pulses:  Normal pulses noted Extremities: No clubbing, cyanosis, edema or deformities noted Neurological: Alert oriented x 4, grossly nonfocal Psychological:  Alert and cooperative. Normal mood and affect  Assessment and Recommendations:  1. Constipation is likely the cause of her lower abdominal discomfort and sense of incomplete evacuation. It appears she had a self-limited diarrheal illness that immediately preceded the symptoms. Begin MiraLAX once or twice daily titrated for adequate bowel movements. If symptoms have not completely resolved within the next 1-2 weeks she is advised to call back and we will proceed with blood work and an abdominal pelvic CT to further evaluate.

## 2017-03-27 ENCOUNTER — Other Ambulatory Visit: Payer: Self-pay

## 2017-03-27 ENCOUNTER — Telehealth: Payer: Self-pay | Admitting: Family Medicine

## 2017-03-27 MED ORDER — AMPHETAMINE-DEXTROAMPHETAMINE 20 MG PO TABS: 20.0000 mg | ORAL_TABLET | Freq: Every day | ORAL | 0 refills | Status: DC

## 2017-03-27 NOTE — Telephone Encounter (Signed)
Rx for Adderall has been printed waiting for dr Sherren Mocha to sign.

## 2017-03-27 NOTE — Telephone Encounter (Signed)
Pt needs new rx generic adderall 20 mg °

## 2017-03-27 NOTE — Telephone Encounter (Signed)
Rx has been printed waiting for dr Todd to sign 

## 2017-03-28 ENCOUNTER — Telehealth: Payer: Self-pay

## 2017-03-28 NOTE — Telephone Encounter (Signed)
Rx for adderall has been signed and pt is aware to pick it up at the front desk, pt state she will be here in the morning to pick up Rx.

## 2017-03-28 NOTE — Telephone Encounter (Signed)
Rx was signed and pt notified to pick up at the front desk

## 2017-03-29 ENCOUNTER — Other Ambulatory Visit: Payer: Self-pay

## 2017-03-29 MED ORDER — AMPHETAMINE-DEXTROAMPHETAMINE 20 MG PO TABS: 20.0000 mg | ORAL_TABLET | Freq: Two times a day (BID) | ORAL | 0 refills | Status: DC

## 2017-03-29 NOTE — Telephone Encounter (Signed)
Rx was printed signed and pt picked it up at the front desk on 03/28/2017

## 2017-04-07 DIAGNOSIS — H524 Presbyopia: Secondary | ICD-10-CM | POA: Diagnosis not present

## 2017-05-30 ENCOUNTER — Ambulatory Visit: Payer: PPO | Admitting: Family Medicine

## 2017-05-30 ENCOUNTER — Encounter: Payer: Self-pay | Admitting: Family Medicine

## 2017-05-30 VITALS — BP 140/90 | HR 86 | Temp 98.5°F | Wt 121.0 lb

## 2017-05-30 DIAGNOSIS — J4521 Mild intermittent asthma with (acute) exacerbation: Secondary | ICD-10-CM

## 2017-05-30 HISTORY — DX: Mild intermittent asthma with (acute) exacerbation: J45.21

## 2017-05-30 MED ORDER — PREDNISONE 20 MG PO TABS: ORAL_TABLET | ORAL | 1 refills | Status: DC

## 2017-05-30 NOTE — Patient Instructions (Signed)
Prednisone 20 mg...........Marland Kitchen 1 tablet 5 days, half a tab 5 days, then a half a tab Monday Wednesday Friday for a three-week taper  Return when necessary

## 2017-05-30 NOTE — Progress Notes (Signed)
Joletta is a 66 year old married female nonsmoker who comes in today for evaluation of a cough for 4 weeks  She has a history of seasonal allergies usually in the fall. This fall she has a lot of upper respiratory symptoms sneezing runny nose and about 4 weeks ago developed a cough. 2 weeks ago she felt like she might be wheezing.  Review of systems otherwise negative  BP 140/90 (BP Location: Left Arm, Patient Position: Sitting, Cuff Size: Normal)   Pulse 86   Temp 98.5 F (36.9 C) (Oral)   Wt 121 lb (54.9 kg)   SpO2 97%   BMI 24.86 kg/m  She's a well-developed well-nourished female no acute distress HEENT were negative neck was supple no adenopathy thyroid normal. Cardiopulmonary exam normal except for some mild symmetrical wheezing bilaterally with forced expiration  #1 allergic rhinitis with secondary asthma..............Marland Kitchen prednisone burst and taper

## 2017-06-01 ENCOUNTER — Ambulatory Visit: Payer: Self-pay

## 2017-06-01 NOTE — Telephone Encounter (Signed)
Pt. called to report she saw Dr. Sherren Mocha 2 days ago.  Reported hx of cough x 4 weeks. Reported the cough is not any better.  Has some wheezing in right lung, but stated not worse than when seen in office.  Stated she has had 2 doses of Prednisone, and instructed to skip today, and then resume tomorrow.  Questioned if she hasn't given enough time for the Prednisone to take affect?  Denies fever/chills. Stated has some chest pressure with the cough.  Questioned about any shortness of breath; stated it is more effort to breath, especially with cough.  Stated able to go up and down steps fine without stopping to get breath; talking in complete sentences.  Advised to give Prednisone more time to take effect; approx. 3-4 days.  Care advise given per protocol.  Advised if sx's worsen over weekend, to go to UC.  Verb. Understanding; agrees with plan.       Reason for Disposition . Cough has been present for > 3 weeks  Answer Assessment - Initial Assessment Questions 1. ONSET: "When did the cough begin?"      Cough started about 4 weeks ago 2. SEVERITY: "How bad is the cough today?"     Worse since she saw MD on Tues.  3. RESPIRATORY DISTRESS: "Describe your breathing."      Short of breath; more of an effort to breathe; able to go up and down steps fine  4. FEVER: "Do you have a fever?" If so, ask: "What is your temperature, how was it measured, and when did it start?"     no 5. SPUTUM: "Describe the color of your sputum" (clear, white, yellow, green)     white 6. HEMOPTYSIS: "Are you coughing up any blood?" If so ask: "How much?" (flecks, streaks, tablespoons, etc.)     no 7. CARDIAC HISTORY: "Do you have any history of heart disease?" (e.g., heart attack, congestive heart failure)      No  8. LUNG HISTORY: "Do you have any history of lung disease?"  (e.g., pulmonary embolus, asthma, emphysema)     Seasonal asthma in past x 1; recently diagnosed with this on Tues, 11/13 9. PE RISK FACTORS: "Do you have a  history of blood clots?" (or: recent major surgery, recent prolonged travel, bedridden )     no 10. OTHER SYMPTOMS: "Do you have any other symptoms?" (e.g., runny nose, wheezing, chest pain)       Some wheezing on right lung ; no nasal congestion; some chest pressure 11. PREGNANCY: "Is there any chance you are pregnant?" "When was your last menstrual period?"       no 12. TRAVEL: "Have you traveled out of the country in the last month?" (e.g., travel history, exposures)       no  Protocols used: [place]

## 2017-08-14 ENCOUNTER — Telehealth: Payer: Self-pay | Admitting: Family Medicine

## 2017-08-14 ENCOUNTER — Other Ambulatory Visit: Payer: Self-pay

## 2017-08-14 MED ORDER — AMPHETAMINE-DEXTROAMPHETAMINE 20 MG PO TABS: 20.0000 mg | ORAL_TABLET | Freq: Two times a day (BID) | ORAL | 0 refills | Status: DC

## 2017-08-14 MED ORDER — AMPHETAMINE-DEXTROAMPHETAMINE 20 MG PO TABS: 20.0000 mg | ORAL_TABLET | Freq: Every day | ORAL | 0 refills | Status: DC

## 2017-08-14 NOTE — Telephone Encounter (Signed)
Copied from [place] [id number]. Topic: Quick Communication - Rx Refill/Question >> Aug 14, 2017  8:36 AM Synthia Innocent wrote: Medication: amphetamine-dextroamphetamine (ADDERALL) 20 MG tablet, requesting 30mg    Has the patient contacted their pharmacy? No. Requesting dose change   (Agent: If no, request that the patient contact the pharmacy for the refill.)   Preferred Pharmacy (with phone number or street name): [facility] [address]   Agent: Please be advised that RX refills may take up to 3 business days. We ask that you follow-up with your pharmacy.

## 2017-08-14 NOTE — Telephone Encounter (Signed)
Rx has been printed waiting to be signed.

## 2017-08-14 NOTE — Telephone Encounter (Signed)
Ra has been signed and is ready for pick up at the front desk. Left a message for pt to call the office.

## 2017-08-15 ENCOUNTER — Other Ambulatory Visit: Payer: Self-pay

## 2017-08-15 MED ORDER — AMPHETAMINE-DEXTROAMPHETAMINE 20 MG PO TABS: 20.0000 mg | ORAL_TABLET | Freq: Two times a day (BID) | ORAL | 0 refills | Status: DC

## 2017-08-15 NOTE — Telephone Encounter (Signed)
Rx for Aderrall was reprinted due to an error.

## 2017-09-18 ENCOUNTER — Other Ambulatory Visit: Payer: Self-pay | Admitting: Family Medicine

## 2017-09-18 DIAGNOSIS — Z1231 Encounter for screening mammogram for malignant neoplasm of breast: Secondary | ICD-10-CM

## 2017-10-05 ENCOUNTER — Ambulatory Visit
Admission: RE | Admit: 2017-10-05 | Discharge: 2017-10-05 | Disposition: A | Discharge status: Home / Self Care | Payer: PPO | Source: Ambulatory Visit | Attending: Family Medicine | Admitting: Family Medicine

## 2017-10-05 DIAGNOSIS — Z1231 Encounter for screening mammogram for malignant neoplasm of breast: Secondary | ICD-10-CM | POA: Diagnosis not present

## 2017-10-06 ENCOUNTER — Other Ambulatory Visit: Payer: Self-pay | Admitting: Family Medicine

## 2017-10-06 DIAGNOSIS — E039 Hypothyroidism, unspecified: Secondary | ICD-10-CM

## 2017-10-09 ENCOUNTER — Other Ambulatory Visit: Payer: Self-pay

## 2017-10-09 ENCOUNTER — Ambulatory Visit (INDEPENDENT_AMBULATORY_CARE_PROVIDER_SITE_OTHER): Payer: PPO

## 2017-10-09 ENCOUNTER — Encounter: Payer: Self-pay | Admitting: Family Medicine

## 2017-10-09 ENCOUNTER — Ambulatory Visit (INDEPENDENT_AMBULATORY_CARE_PROVIDER_SITE_OTHER): Payer: PPO | Admitting: Family Medicine

## 2017-10-09 VITALS — BP 135/85 | HR 84 | Temp 98.3°F | Ht <= 58 in | Wt 121.0 lb

## 2017-10-09 DIAGNOSIS — E039 Hypothyroidism, unspecified: Secondary | ICD-10-CM

## 2017-10-09 DIAGNOSIS — M545 Low back pain, unspecified: Secondary | ICD-10-CM

## 2017-10-09 DIAGNOSIS — F909 Attention-deficit hyperactivity disorder, unspecified type: Secondary | ICD-10-CM

## 2017-10-09 DIAGNOSIS — Z0001 Encounter for general adult medical examination with abnormal findings: Secondary | ICD-10-CM

## 2017-10-09 DIAGNOSIS — M79604 Pain in right leg: Secondary | ICD-10-CM

## 2017-10-09 DIAGNOSIS — I10 Essential (primary) hypertension: Secondary | ICD-10-CM | POA: Insufficient documentation

## 2017-10-09 DIAGNOSIS — M48061 Spinal stenosis, lumbar region without neurogenic claudication: Secondary | ICD-10-CM | POA: Diagnosis not present

## 2017-10-09 DIAGNOSIS — M160 Bilateral primary osteoarthritis of hip: Secondary | ICD-10-CM | POA: Diagnosis not present

## 2017-10-09 DIAGNOSIS — Z0000 Encounter for general adult medical examination without abnormal findings: Secondary | ICD-10-CM

## 2017-10-09 HISTORY — DX: Pain in right leg: M79.604

## 2017-10-09 HISTORY — DX: Low back pain, unspecified: M54.50

## 2017-10-09 HISTORY — DX: Essential (primary) hypertension: I10

## 2017-10-09 LAB — CBC WITH DIFFERENTIAL/PLATELET
Basophils Absolute: 0.1 10*3/uL (ref 0.0–0.1)
Basophils Relative: 0.9 % (ref 0.0–3.0)
EOS PCT: 1 % (ref 0.0–5.0)
Eosinophils Absolute: 0.1 10*3/uL (ref 0.0–0.7)
HEMATOCRIT: 38.3 % (ref 36.0–46.0)
HEMOGLOBIN: 12.7 g/dL (ref 12.0–15.0)
LYMPHS ABS: 2 10*3/uL (ref 0.7–4.0)
Lymphocytes Relative: 34.4 % (ref 12.0–46.0)
MCHC: 33 g/dL (ref 30.0–36.0)
MCV: 79 fl (ref 78.0–100.0)
MONOS PCT: 5.7 % (ref 3.0–12.0)
Monocytes Absolute: 0.3 10*3/uL (ref 0.1–1.0)
NEUTROS PCT: 58 % (ref 43.0–77.0)
Neutro Abs: 3.4 10*3/uL (ref 1.4–7.7)
Platelets: 272 10*3/uL (ref 150.0–400.0)
RBC: 4.85 Mil/uL (ref 3.87–5.11)
RDW: 15.3 % (ref 11.5–15.5)
WBC: 5.8 10*3/uL (ref 4.0–10.5)

## 2017-10-09 LAB — BASIC METABOLIC PANEL
BUN: 18 mg/dL (ref 6–23)
CHLORIDE: 100 meq/L (ref 96–112)
CO2: 30 meq/L (ref 19–32)
Calcium: 9.8 mg/dL (ref 8.4–10.5)
Creatinine, Ser: 0.62 mg/dL (ref 0.40–1.20)
GFR: 101.44 mL/min (ref >60.00)
Glucose, Bld: 100 mg/dL — ABNORMAL HIGH (ref 70–99)
Potassium: 3.4 mEq/L — ABNORMAL LOW (ref 3.5–5.1)
SODIUM: 139 meq/L (ref 135–145)

## 2017-10-09 LAB — HEPATIC FUNCTION PANEL
ALK PHOS: 111 U/L (ref 39–117)
ALT: 20 U/L (ref 0–35)
AST: 24 U/L (ref 0–37)
Albumin: 4.1 g/dL (ref 3.5–5.2)
BILIRUBIN DIRECT: 0.1 mg/dL (ref 0.0–0.3)
TOTAL PROTEIN: 7.2 g/dL (ref 6.0–8.3)
Total Bilirubin: 0.5 mg/dL (ref 0.2–1.2)

## 2017-10-09 LAB — POCT URINALYSIS DIPSTICK
Bilirubin, UA: NEGATIVE
Blood, UA: NEGATIVE
GLUCOSE UA: NEGATIVE
Ketones, UA: NEGATIVE
LEUKOCYTES UA: NEGATIVE
Nitrite, UA: NEGATIVE
ODOR: NEGATIVE
PROTEIN UA: NEGATIVE
Spec Grav, UA: 1.02 (ref 1.010–1.025)
Urobilinogen, UA: 0.2 E.U./dL
pH, UA: 6 (ref 5.0–8.0)

## 2017-10-09 LAB — LIPID PANEL
CHOL/HDL RATIO: 3
Cholesterol: 221 mg/dL — ABNORMAL HIGH (ref 0–200)
HDL: 64.8 mg/dL (ref >39.00)
LDL Cholesterol: 133 mg/dL — ABNORMAL HIGH (ref 0–99)
NONHDL: 156.29
Triglycerides: 117 mg/dL (ref 0.0–149.0)
VLDL: 23.4 mg/dL (ref 0.0–40.0)

## 2017-10-09 LAB — TSH: TSH: 0.51 u[IU]/mL (ref 0.35–4.50)

## 2017-10-09 MED ORDER — SYNTHROID 100 MCG PO TABS: 100.0000 ug | ORAL_TABLET | Freq: Every day | ORAL | 4 refills | Status: DC

## 2017-10-09 MED ORDER — AMPHETAMINE-DEXTROAMPHETAMINE 20 MG PO TABS: 20.0000 mg | ORAL_TABLET | Freq: Two times a day (BID) | ORAL | 0 refills | Status: DC

## 2017-10-09 MED ORDER — HYDROCHLOROTHIAZIDE 25 MG PO TABS: ORAL_TABLET | ORAL | 4 refills | Status: DC

## 2017-10-09 NOTE — Progress Notes (Signed)
Gabrielle Vargas is a 67 year old married female nonsmoker who comes in today for general physical examination because of history of adult ADD, hypertension, hypothyroidism, and a new problem of low back pain  For her adult ADD she continues take Adderall 20 mg twice a day  She has underlying mild hypertension controlled with Hydrocort thiazide 25 mg daily. BP at home 135/85 or less She takes a baby aspirin dailyShe takes 100 g of Synthroid daily because a history of hypothyroidism  A couple years ago she had cervical disc surgery. At that time she was noted to have some lumbar disc disease but she was asymptomatic. 4 months ago she began having pain in her right hip that radiates down to the underside of her right knee. It comes and goes. Sometimes last for a few seconds sometimes it lasts for couple hours. She says is worse at night and is worse when she goes from sitting to standing. She describes as dull, a 7 on a scale of 1-10, radiation as above.  She also has mild rosacea  She also complains of splitting fingernails and toenails etiology unknown.  Judeen Hammans uterus removed many years ago for nonmalignant reasons. Therefore pelvics not indicated  She gets routine eye care, dental care, BSE monthly, annual mammography, colonoscopy 2015 was normal except for some diverticuli.  Vaccinations up-to-date information given on shingles vaccine  14 point review of systems reviewed and otherwise negative  EKG was done because of a history of hypertension.. EKG was normal and unchanged  BP 135/85 (BP Location: Left Arm, Patient Position: Sitting, Cuff Size: Normal)   Pulse 84   Temp 98.3 F (36.8 C) (Oral)   Ht 4\' 10"  (1.473 m)   Wt 121 lb (54.9 kg)   BMI 25.29 kg/m  Well-developed well-nourished female no acute distress vital signs stable she's afebrile HEENT were negative except for scar left neck from previous cervical disc surgery. No carotid bruits. Cardiopulmonary exam normal breast exam normal  scars from previous reduction mammoplasty. I'll exam was negative except for scar in the midline from the umbilicus to pubis secondary to previous TAH.  Extremities normal skin normal peripheral pulses normal  #1 healthy female  #2 hypertension ago........... Continue current therapy  #3 hypothyroidism........ Continue Synthroid  #4 new problem of low back pain rating for right hip down to her inner side of her left knee....... Begin diagnostic workup with x-rays of her hips and spine. Follow-up depending on what the x-rays tell us.

## 2017-10-09 NOTE — Patient Instructions (Signed)
Labs today.......I will call you if  anything abnormal  Continue current medications  Go to the [place] office for x-rays you back in your hips......... I will call you the report.

## 2017-10-10 ENCOUNTER — Other Ambulatory Visit: Payer: Self-pay | Admitting: Family Medicine

## 2017-10-10 DIAGNOSIS — M545 Low back pain: Secondary | ICD-10-CM

## 2017-10-16 ENCOUNTER — Other Ambulatory Visit: Payer: Self-pay

## 2017-10-16 ENCOUNTER — Ambulatory Visit: Payer: PPO | Attending: Family Medicine

## 2017-10-16 DIAGNOSIS — M5441 Lumbago with sciatica, right side: Secondary | ICD-10-CM | POA: Insufficient documentation

## 2017-10-16 DIAGNOSIS — G8929 Other chronic pain: Secondary | ICD-10-CM

## 2017-10-16 DIAGNOSIS — R252 Cramp and spasm: Secondary | ICD-10-CM

## 2017-10-16 DIAGNOSIS — M6281 Muscle weakness (generalized): Secondary | ICD-10-CM | POA: Diagnosis not present

## 2017-10-16 NOTE — Therapy (Signed)
[facility] [address], [place], [place], [address] Phone: [phone]   Fax:  [phone]  Physical Therapy Evaluation  Patient Details  Name: Gabrielle Vargas MRN: [id number] Date of Birth: April 10, 1951 Referring Provider: Stevie Kern, MD   Encounter Date: 10/16/2017  PT End of Session - 10/16/17 1003    Visit Number  1    Date for PT Re-Evaluation  12/11/17    Authorization Type  Medicare    PT Start Time  0912    PT Stop Time  1006    PT Time Calculation (min)  54 min    Activity Tolerance  Patient tolerated treatment well    Behavior During Therapy  [facility] for tasks assessed/performed       Past Medical History:  Diagnosis Date  . Arthritis    Osteo  . Asthma    Seasonal  . Diverticula, colon   . Hyperlipidemia   . Hypertension   . Hypothyroidism     Past Surgical History:  Procedure Laterality Date  . ABDOMINAL HYSTERECTOMY    . BREAST SURGERY    . CARPAL TUNNEL RELEASE    . CERVICAL DISC ARTHROPLASTY    . CESAREAN SECTION     x2  . left shoulder surgery    . right shoulder      There were no vitals filed for this visit.   Subjective Assessment - 10/16/17 0916    Subjective  Pt reports to PT with complaints of Rt sided LBP and Rt hip pain of a chronic nature lasting >5 months.   Pt reports that onset is gradual.  Pt reports a popping in her Rt anterior hip with change of position.     Patient is accompained by:  Family member    How long can you sit comfortably?  pain after sitting long periods    Diagnostic tests  x-ray: narrowing in bil hips    Patient Stated Goals  reduce back and hip    Currently in Pain?  Yes    Pain Score  0-No pain 8/10 at night, 4-5/10 during day    Pain Location  Back    Pain Orientation  Right    Pain Descriptors / Indicators  Aching    Pain Type  Chronic pain    Pain Radiating Towards  Rt hip to posterior knee    Pain Onset  More than a month ago    Pain  Frequency  Intermittent    Aggravating Factors   change of position, sleep at night    Pain Relieving Factors  change of position, ibuprofen         [facility] PT Assessment - 10/16/17 0001      Assessment   Medical Diagnosis  low back pain, Rt hip pain    Referring Provider  Stevie Kern, MD    Onset Date/Surgical Date  05/18/17    Next MD Visit  as needed      Precautions   Precautions  None      Restrictions   Weight Bearing Restrictions  No      Balance Screen   Has the patient fallen in the past 6 months  No    Has the patient had a decrease in activity level because of a fear of falling?   No    Is the patient reluctant to leave their home because of a fear of falling?   No      Home Environment  Living Environment  Private residence    Living Arrangements  Spouse/significant other    Type of [place] to enter    Home Layout  Two level      Prior Function   Level of [place]  Retired    Leisure  walking, gardening      Cognition   Overall Cognitive Status  Within Functional Limits for tasks assessed      Observation/Other Assessments   Focus on Therapeutic Outcomes (FOTO)   30% limitation      ROM / Strength   AROM / PROM / Strength  AROM;PROM;Strength      AROM   Overall AROM   Within functional limits for tasks performed    Overall AROM Comments  full lumbar A/ROM with Rt lumbar/gluteal pain with Rt sidebending      PROM   Overall PROM   Deficits    Overall PROM Comments  bil hip flexibility is limited by 30% on the Rt and 20% on the Lt without signfiicant pain.      Strength   Overall Strength  Deficits    Strength Assessment Site  Hip;Knee    Right/Left Hip  Right;Left    Right Hip Flexion  4/5    Right Hip Extension  4+/5    Right Hip ABduction  4/5    Left Hip Flexion  4+/5    Left Hip Extension  4+/5    Left Hip ABduction  4-/5    Right/Left Knee  Right;Left    Right Knee Flexion  5/5     Right Knee Extension  5/5    Left Knee Flexion  5/5    Left Knee Extension  5/5      Palpation   Spinal mobility  reduced PA mobility without pain L1-5    Palpation comment  tension and trigger points in bil lumbar paraspinals and trigger points and pain over Rt proximal gluteals and piriformis      Special Tests   Other special tests  negative lumbar special tests      Transfers   Transfers  Independent with all Transfers      Ambulation/Gait   Ambulation/Gait  Yes    Gait Pattern  Within Functional Limits              No data recorded  Objective measurements completed on examination: See above findings.              PT Education - 10/16/17 1000    Education provided  Yes    Education Details  hip flexibility    Person(s) Educated  Patient    Methods  Explanation;Demonstration;Handout    Comprehension  Verbalized understanding;Returned demonstration       PT Short Term Goals - 10/16/17 1035      PT SHORT TERM GOAL #1   Title  be independent in initial HEP    Time  4    Period  Weeks    Status  New    Target Date  11/13/17      PT SHORT TERM GOAL #2   Title  report < or = to 5/10 Rt buttock/leg pain with sleep at night    Time  4    Period  Weeks    Status  New    Target Date  11/13/17      PT SHORT TERM GOAL #3   Title  report a 25% reducion in LBP/buttock pain with gardening activity    Time  4    Period  Weeks    Status  New    Target Date  11/13/17        PT Long Term Goals - 10/16/17 1038      PT LONG TERM GOAL #1   Title  be independent in advanced HEP    Time  8    Period  Weeks    Status  New    Target Date  12/11/17      PT LONG TERM GOAL #2   Title  report < or = to 3/10 Rt buttock/leg pain with sleep at night    Time  8    Period  Weeks    Status  New    Target Date  12/11/17      PT LONG TERM GOAL #3   Title  sleep a full night without waking due to pain    Time  8    Period  Weeks    Status  New     Target Date  12/11/17      PT LONG TERM GOAL #4   Title  report a 75% reduction in overall lumbar/hip  pain with gardening and household tasks    Time  8    Period  Weeks    Status  New    Target Date  12/11/17             Plan - 10/16/17 1013    Clinical Impression Statement  Pt presents to PT with > 5 month history of LBP and Rt gluteal/hip pain that began gradually.  Recent x-ray showed mild narrowing in bil hip joints.  Pt reports 8/10 Rt buttock pain at night with sleeep and 4-5/10 low back and Rt gluteal pain with daily activity including gardening.  Pt demonstrates Rt>Lt hip stiffness, weakness and trigger points in Rt gluteals and tension in bil lumbar paraspinals.  Pt with reduced lumbar segmental mobility.  Pt will benefit from skilled PT for hip strength, flexibility, dry needling and manual to address spinal mobility and trigger points in Rt gluteals.     History and Personal Factors relevant to plan of care:  cervical fusion    Clinical Presentation  Stable    Clinical Presentation due to:  5 month history without much variation    Clinical Decision Making  Low    PT Frequency  2x / week    PT Duration  8 weeks    PT Treatment/Interventions  ADLs/Self Care Home Management;Cryotherapy;Electrical Stimulation;Ultrasound;Gait training;Stair training;Functional mobility training;Therapeutic activities;Therapeutic exercise;Patient/family education;Neuromuscular re-education;Manual techniques;Passive range of motion;Taping;Dry needling    PT Next Visit Plan  dry needling to lumbar multifidi and Rt gluteals, review flexibility exercises, clams and TA activation    Consulted and Agree with Plan of Care  Patient       Patient will benefit from skilled therapeutic intervention in order to improve the following deficits and impairments:  Improper body mechanics, Pain, Increased muscle spasms, Postural dysfunction, Decreased activity tolerance, Decreased strength, Impaired  flexibility  Visit Diagnosis: Chronic right-sided low back pain with right-sided sciatica - Plan: PT plan of care cert/re-cert  Cramp and spasm - Plan: PT plan of care cert/re-cert  Muscle weakness (generalized) - Plan: PT plan of care cert/re-cert     Problem List Patient Active Problem List   Diagnosis Date Noted  . Essential hypertension 10/09/2017  . Low back pain radiating  to right leg 10/09/2017  . Mild intermittent asthma with acute exacerbation 05/30/2017  . Hordeolum externum (stye) 07/28/2016  . Routine general medical examination at a health care facility 08/18/2015  . Diverticulitis of colon (without mention of hemorrhage)(562.11) 09/17/2013  . HYPERTENSION, BENIGN ESSENTIAL 02/05/2010  . VAGINITIS, ATROPHIC, SEVERE 08/18/2008  . Hypothyroidism 05/09/2007  . Attention deficit disorder 05/09/2007     Sigurd Sos, PT 10/16/17 10:44 AM  [facility] [address] [place], [place], [address] Phone: [phone]   Fax:  [phone]  Name: BRITTAINY BUCKER MRN: [id number] Date of Birth: [birth year]

## 2017-10-16 NOTE — Patient Instructions (Signed)
Access Code: [email or web address]  URL: [email or web address]  Date: 10/16/2017  Prepared by: Sigurd Sos   Exercises  Supine Piriformis Stretch with Leg Straight - 3 reps - 1 sets - 30 hold - 1x daily - 7x weekly  Seated Piriformis Stretch with Trunk Bend - 3 reps - 1 sets - 30 hold - 3x daily - 7x weekly  Supine Single Knee to Chest Stretch - 3 reps - 1 sets - 30 hold - 1x daily - 7x weekly  Hip Flexor Stretch at Edge of Bed - 10 reps - 3 sets - 1x daily - 7x weekly  Seated Transversus Abdominis Bracing - 10 reps - 3 sets - 1x daily - 7x weekly    [facility] [address], [place], [place] [address] Phone # [phone] Fax [phone]

## 2017-10-23 ENCOUNTER — Ambulatory Visit: Payer: PPO

## 2017-10-23 DIAGNOSIS — M5441 Lumbago with sciatica, right side: Principal | ICD-10-CM

## 2017-10-23 DIAGNOSIS — R252 Cramp and spasm: Secondary | ICD-10-CM

## 2017-10-23 DIAGNOSIS — G8929 Other chronic pain: Secondary | ICD-10-CM

## 2017-10-23 DIAGNOSIS — M6281 Muscle weakness (generalized): Secondary | ICD-10-CM

## 2017-10-23 NOTE — Therapy (Signed)
[facility] [address] [place], [place], [address] Phone: [phone]   Fax:  [phone]  Physical Therapy Treatment  Patient Details  Name: Gabrielle Vargas MRN: [id number] Date of Birth: [birth year] Referring Provider: Stevie Kern, MD   Encounter Date: 10/23/2017  PT End of Session - 10/23/17 1534    Visit Number  2    Date for PT Re-Evaluation  12/11/17    Authorization Type  Medicare    PT Start Time  1448    PT Stop Time  1540    PT Time Calculation (min)  52 min    Activity Tolerance  Patient tolerated treatment well    Behavior During Therapy  [facility] for tasks assessed/performed       Past Medical History:  Diagnosis Date  . Arthritis    Osteo  . Asthma    Seasonal  . Diverticula, colon   . Hyperlipidemia   . Hypertension   . Hypothyroidism     Past Surgical History:  Procedure Laterality Date  . ABDOMINAL HYSTERECTOMY    . BREAST SURGERY    . CARPAL TUNNEL RELEASE    . CERVICAL DISC ARTHROPLASTY    . CESAREAN SECTION     x2  . left shoulder surgery    . right shoulder      There were no vitals filed for this visit.  Subjective Assessment - 10/23/17 1451    Subjective  I am doing my exercises.  I am stiff in my low back.      Currently in Pain?  Yes    Pain Score  6     Pain Location  Back    Pain Orientation  Right    Pain Descriptors / Indicators  Aching    Pain Type  Chronic pain    Pain Onset  More than a month ago    Pain Frequency  Intermittent    Aggravating Factors   change or position, sleep at night    Pain Relieving Factors  change of position, ibuprofen                       [facility] Adult PT Treatment/Exercise - 10/23/17 0001      Exercises   Exercises  Lumbar;Knee/Hip      Lumbar Exercises: Stretches   Single Knee to Chest Stretch  3 reps;20 seconds    Lower Trunk Rotation  3 reps;20 seconds    Piriformis Stretch  3 reps;20 seconds    Other  Lumbar Stretch Exercise  hip flexor stretch      Modalities   Modalities  Moist Heat      Moist Heat Therapy   Number Minutes Moist Heat  10 Minutes    Moist Heat Location  Lumbar Spine      Manual Therapy   Manual Therapy  Soft tissue mobilization;Myofascial release    Manual therapy comments  soft tissue elongation and trigger point release to Rt gluteals and bil lumbar spine    Myofascial Release  skilled palpation and assessement during dry needling today       Trigger Point Dry Needling - 10/23/17 1454    Consent Given?  Yes    Education Handout Provided  Yes    Muscles Treated Lower Body  Gluteus minimus;Gluteus maximus lumbar multifidi at L1-very sensitive so stopped           PT Education - 10/23/17 1453    Education provided  Yes    Education Details  dry needling    Person(s) Educated  Patient    Methods  Explanation;Handout;Demonstration    Comprehension  Verbalized understanding;Returned demonstration       PT Short Term Goals - 10/16/17 1035      PT SHORT TERM GOAL #1   Title  be independent in initial HEP    Time  4    Period  Weeks    Status  New    Target Date  11/13/17      PT SHORT TERM GOAL #2   Title  report < or = to 5/10 Rt buttock/leg pain with sleep at night    Time  4    Period  Weeks    Status  New    Target Date  11/13/17      PT SHORT TERM GOAL #3   Title  report a 25% reducion in LBP/buttock pain with gardening activity    Time  4    Period  Weeks    Status  New    Target Date  11/13/17        PT Long Term Goals - 10/16/17 1038      PT LONG TERM GOAL #1   Title  be independent in advanced HEP    Time  8    Period  Weeks    Status  New    Target Date  12/11/17      PT LONG TERM GOAL #2   Title  report < or = to 3/10 Rt buttock/leg pain with sleep at night    Time  8    Period  Weeks    Status  New    Target Date  12/11/17      PT LONG TERM GOAL #3   Title  sleep a full night without waking due to pain    Time   8    Period  Weeks    Status  New    Target Date  12/11/17      PT LONG TERM GOAL #4   Title  report a 75% reduction in overall lumbar/hip  pain with gardening and household tasks    Time  8    Period  Weeks    Status  New    Target Date  12/11/17            Plan - 10/23/17 1527    Clinical Impression Statement  Pt with tension and trigger points in bil lumbar spine and Rt gluteals.  Pt was sensitive to dry needling and PT assessed tissue during treatment today.  PT reviewed HEP today and pt was able to correctly demonstrate today.  Pt with Rt>Lt lumbar paraspinal muscle tension.  Pt will benefit from skilled PT for dry needling, hip and lumbar flexibility, core strength aand modalities as needed.    PT Frequency  2x / week    PT Treatment/Interventions  ADLs/Self Care Home Management;Cryotherapy;Electrical Stimulation;Ultrasound;Gait training;Stair training;Functional mobility training;Therapeutic activities;Therapeutic exercise;Patient/family education;Neuromuscular re-education;Manual techniques;Passive range of motion;Taping;Dry needling    PT Next Visit Plan  assess response to dry needling, manual to gluteals and lumbar, hip flexitility, begin core strength, body mechanics training    Consulted and Agree with Plan of Care  Patient;Family member/caregiver    Family Member Consulted  daughter       Patient will benefit from skilled therapeutic intervention in order to improve the following deficits and impairments:  Improper body mechanics, Pain, Increased muscle spasms,  Postural dysfunction, Decreased activity tolerance, Decreased strength, Impaired flexibility  Visit Diagnosis: Chronic right-sided low back pain with right-sided sciatica  Cramp and spasm  Muscle weakness (generalized)     Problem List Patient Active Problem List   Diagnosis Date Noted  . Essential hypertension 10/09/2017  . Low back pain radiating to right leg 10/09/2017  . Mild intermittent asthma  with acute exacerbation 05/30/2017  . Hordeolum externum (stye) 07/28/2016  . Routine general medical examination at a health care facility 08/18/2015  . Diverticulitis of colon (without mention of hemorrhage)(562.11) 09/17/2013  . HYPERTENSION, BENIGN ESSENTIAL 02/05/2010  . VAGINITIS, ATROPHIC, SEVERE 08/18/2008  . Hypothyroidism 05/09/2007  . Attention deficit disorder 05/09/2007   Sigurd Sos, PT 10/23/17 4:16 PM  [facility] [address], [place], [place], [address] Phone: [phone]   Fax:  [phone]  Name: DEERICA WASZAK MRN: [id number] Date of Birth: [birth year]

## 2017-10-23 NOTE — Patient Instructions (Addendum)

## 2017-10-26 ENCOUNTER — Ambulatory Visit (INDEPENDENT_AMBULATORY_CARE_PROVIDER_SITE_OTHER): Payer: PPO | Admitting: Adult Health

## 2017-10-26 ENCOUNTER — Encounter: Payer: Self-pay | Admitting: Adult Health

## 2017-10-26 VITALS — BP 126/70 | Temp 98.4°F | Wt 118.0 lb

## 2017-10-26 DIAGNOSIS — J029 Acute pharyngitis, unspecified: Secondary | ICD-10-CM

## 2017-10-26 NOTE — Progress Notes (Signed)
Subjective:    Patient ID: ARLI BREE, female    DOB: 11-20-1950, 67 y.o.   MRN: [id number]  URI   This is a new problem. The maximum temperature recorded prior to her arrival was 100.4 - 100.9 F. The fever has been present for less than 1 day. Associated symptoms include coughing (dry ), a plugged ear sensation, sinus pain and a sore throat. Pertinent negatives include no congestion, ear pain, headaches, nausea, neck pain, rash, rhinorrhea, vomiting or wheezing. She has tried nothing for the symptoms.    Review of Systems  Constitutional: Positive for chills, fatigue and fever.  HENT: Positive for sinus pain, sore throat and trouble swallowing. Negative for congestion, ear pain, rhinorrhea, sinus pressure and voice change.   Respiratory: Positive for cough (dry ). Negative for chest tightness and wheezing.   Cardiovascular: Negative.   Gastrointestinal: Negative.  Negative for nausea and vomiting.  Musculoskeletal: Negative for neck pain.  Skin: Negative for rash.  Neurological: Negative for headaches.   Past Medical History:  Diagnosis Date  . Arthritis    Osteo  . Asthma    Seasonal  . Diverticula, colon   . Hyperlipidemia   . Hypertension   . Hypothyroidism     Social History   Socioeconomic History  . Marital status: Married    Spouse name: Not on file  . Number of children: 2  . Years of education: Not on file  . Highest education level: Not on file  Occupational History  . Occupation: Retired  Scientific laboratory technician  . Financial resource strain: Not on file  . Food insecurity:    Worry: Not on file    Inability: Not on file  . Transportation needs:    Medical: Not on file    Non-medical: Not on file  Tobacco Use  . Smoking status: Never Smoker  . Smokeless tobacco: Never Used  Substance and Sexual Activity  . Alcohol use: No  . Drug use: No  . Sexual activity: Not on file  Lifestyle  . Physical activity:    Days per week: Not on file    Minutes per  session: Not on file  . Stress: Not on file  Relationships  . Social connections:    Talks on phone: Not on file    Gets together: Not on file    Attends religious service: Not on file    Active member of club or organization: Not on file    Attends meetings of clubs or organizations: Not on file    Relationship status: Not on file  . Intimate partner violence:    Fear of current or ex partner: Not on file    Emotionally abused: Not on file    Physically abused: Not on file    Forced sexual activity: Not on file  Other Topics Concern  . Not on file  Social History Narrative  . Not on file    Past Surgical History:  Procedure Laterality Date  . ABDOMINAL HYSTERECTOMY    . BREAST SURGERY    . CARPAL TUNNEL RELEASE    . CERVICAL DISC ARTHROPLASTY    . CESAREAN SECTION     x2  . left shoulder surgery    . right shoulder      Family History  Problem Relation Age of Onset  . Kidney disease Father   . Diabetes Father   . Heart disease Father   . Melanoma Mother   . Colon cancer Neg Hx   .  Esophageal cancer Neg Hx   . Rectal cancer Neg Hx   . Stomach cancer Neg Hx     Allergies  Allergen Reactions  . Latex     REACTION: rash/hives  . Oxycodone-Acetaminophen     REACTION: nausea    Current Outpatient Medications on File Prior to Visit  Medication Sig Dispense Refill  . amphetamine-dextroamphetamine (ADDERALL) 20 MG tablet Take 1 tablet (20 mg total) by mouth 2 (two) times daily. 60 tablet 0  . amphetamine-dextroamphetamine (ADDERALL) 20 MG tablet Take 1 tablet (20 mg total) by mouth 2 (two) times daily. 60 tablet 0  . amphetamine-dextroamphetamine (ADDERALL) 20 MG tablet Take 1 tablet (20 mg total) by mouth 2 (two) times daily. 60 tablet 0  . aspirin 81 MG tablet Take 81 mg by mouth daily.    . hydrochlorothiazide (HYDRODIURIL) 25 MG tablet TAKE 1 TABLET(25 MG) BY MOUTH DAILY 90 tablet 4  . ibuprofen (ADVIL,MOTRIN) 200 MG tablet Take 200 mg by mouth every 6 (six)  hours as needed.    Marland Kitchen SYNTHROID 100 MCG tablet Take 1 tablet (100 mcg total) by mouth daily. 100 tablet 4   No current facility-administered medications on file prior to visit.     BP 126/70 (BP Location: Left Arm)   Temp 98.4 F (36.9 C) (Oral)   Wt 118 lb (53.5 kg)   BMI 24.66 kg/m       Objective:   Physical Exam  Constitutional: She is oriented to person, place, and time. She appears well-developed and well-nourished. No distress.  HENT:  Right Ear: Hearing, tympanic membrane, external ear and ear canal normal. Tympanic membrane is not bulging.  Left Ear: Hearing, external ear and ear canal normal. Tympanic membrane is bulging. Tympanic membrane is not erythematous.  Nose: Nose normal. No mucosal edema or rhinorrhea. Right sinus exhibits no maxillary sinus tenderness and no frontal sinus tenderness. Left sinus exhibits no maxillary sinus tenderness and no frontal sinus tenderness.  Mouth/Throat: Uvula is midline and mucous membranes are normal. Posterior oropharyngeal erythema (mild) present. No oropharyngeal exudate or posterior oropharyngeal edema.  + clear PND   Cardiovascular: Normal rate, regular rhythm, normal heart sounds and intact distal pulses. Exam reveals no gallop.  No murmur heard. Pulmonary/Chest: Effort normal and breath sounds normal. No respiratory distress. She has no wheezes. She has no rales. She exhibits no tenderness.  Neurological: She is alert and oriented to person, place, and time.  Skin: Skin is warm and dry. She is not diaphoretic.  Psychiatric: She has a normal mood and affect. Her behavior is normal. Judgment and thought content normal.  Nursing note and vitals reviewed.     Assessment & Plan:  1. Sore throat - Likely viral or allergic  - No signs of bacterial infection  - Advised Flonase  - Follow up as needed  [name], NP

## 2017-10-30 ENCOUNTER — Ambulatory Visit: Payer: PPO

## 2017-10-30 ENCOUNTER — Encounter: Payer: Self-pay | Admitting: Family Medicine

## 2017-10-30 ENCOUNTER — Ambulatory Visit (INDEPENDENT_AMBULATORY_CARE_PROVIDER_SITE_OTHER): Payer: PPO | Admitting: Family Medicine

## 2017-10-30 VITALS — BP 130/72 | HR 95 | Temp 98.9°F | Resp 12 | Ht <= 58 in | Wt 117.4 lb

## 2017-10-30 DIAGNOSIS — R0981 Nasal congestion: Secondary | ICD-10-CM

## 2017-10-30 DIAGNOSIS — J040 Acute laryngitis: Secondary | ICD-10-CM | POA: Diagnosis not present

## 2017-10-30 DIAGNOSIS — H1032 Unspecified acute conjunctivitis, left eye: Secondary | ICD-10-CM

## 2017-10-30 DIAGNOSIS — H6502 Acute serous otitis media, left ear: Secondary | ICD-10-CM

## 2017-10-30 DIAGNOSIS — J069 Acute upper respiratory infection, unspecified: Secondary | ICD-10-CM | POA: Diagnosis not present

## 2017-10-30 MED ORDER — ERYTHROMYCIN 5 MG/GM OP OINT: 1.0000 "application " | TOPICAL_OINTMENT | Freq: Every day | OPHTHALMIC | 0 refills | Status: AC

## 2017-10-30 MED ORDER — PREDNISONE 20 MG PO TABS: 40.0000 mg | ORAL_TABLET | Freq: Every day | ORAL | 0 refills | Status: AC

## 2017-10-30 NOTE — Progress Notes (Addendum)
ACUTE VISIT  HPI:  Chief Complaint  Patient presents with  . Eye Pain    left eye pain and drainage   . Sinus Drainage    was seen on Thursday for sore throat by [facility]    [name] is a 67 y.o.female here today with her husband complaining of 4 days of respiratory symptoms. She is concerned about having sinus and eye infection.  Today morning she noted yellowish left eye drainage, mild pruritus, and retro-ocular pressure headache. No associated vision loss, nausea, vomiting, or focal deficit.  According to patient, she was already evaluated 4 days ago, diagnosed with a vital URI. Nonproductive cough but she started with "a little bit" of clear sputum.    URI   This is a new problem. The current episode started in the past 7 days. The problem has been unchanged. Associated symptoms include congestion, coughing, headaches, a plugged ear sensation (Left), rhinorrhea and a sore throat. Pertinent negatives include no abdominal pain, diarrhea, ear pain, nausea, neck pain, rash, sneezing, swollen glands, vomiting or wheezing. The treatment provided no relief.    Dysphonia, is stable overall.  No stridor. + Sore throat, improved. She had fever of 100 F and chills when she first started with symptoms.  No Hx of recent travel. No sick contact.   Hx of allergies: No known but she mentions an episode of seasonal allergies a few years ago with wheezing.  OTC medications for this problem: NyQuil and Flonase nasal spray.  Symptoms otherwise stable.   Review of Systems  Constitutional: Positive for appetite change, chills and fatigue. Negative for activity change and fever.  HENT: Positive for congestion, postnasal drip, rhinorrhea, sinus pressure, sore throat and voice change. Negative for ear pain, mouth sores, nosebleeds, sneezing and trouble swallowing.   Eyes: Positive for discharge and itching. Negative for photophobia, redness and visual disturbance.    Respiratory: Positive for cough. Negative for shortness of breath and wheezing.   Gastrointestinal: Negative for abdominal pain, diarrhea, nausea and vomiting.  Musculoskeletal: Negative for gait problem, myalgias and neck pain.  Skin: Negative for rash.  Allergic/Immunologic: Negative for environmental allergies.  Neurological: Positive for headaches. Negative for syncope, facial asymmetry and weakness.  Hematological: Negative for adenopathy. Does not bruise/bleed easily.  Psychiatric/Behavioral: Negative for confusion. The patient is nervous/anxious.       Current Outpatient Medications on File Prior to Visit  Medication Sig Dispense Refill  . amphetamine-dextroamphetamine (ADDERALL) 20 MG tablet Take 1 tablet (20 mg total) by mouth 2 (two) times daily. 60 tablet 0  . amphetamine-dextroamphetamine (ADDERALL) 20 MG tablet Take 1 tablet (20 mg total) by mouth 2 (two) times daily. 60 tablet 0  . amphetamine-dextroamphetamine (ADDERALL) 20 MG tablet Take 1 tablet (20 mg total) by mouth 2 (two) times daily. 60 tablet 0  . aspirin 81 MG tablet Take 81 mg by mouth daily.    . hydrochlorothiazide (HYDRODIURIL) 25 MG tablet TAKE 1 TABLET(25 MG) BY MOUTH DAILY 90 tablet 4  . ibuprofen (ADVIL,MOTRIN) 200 MG tablet Take 200 mg by mouth every 6 (six) hours as needed.    Marland Kitchen SYNTHROID 100 MCG tablet Take 1 tablet (100 mcg total) by mouth daily. 100 tablet 4   No current facility-administered medications on file prior to visit.      Past Medical History:  Diagnosis Date  . Arthritis    Osteo  . Asthma    Seasonal  . Diverticula, colon   .  Hyperlipidemia   . Hypertension   . Hypothyroidism    Allergies  Allergen Reactions  . Latex     REACTION: rash/hives  . Oxycodone-Acetaminophen     REACTION: nausea    Social History   Socioeconomic History  . Marital status: Married    Spouse name: Not on file  . Number of children: 2  . Years of education: Not on file  . Highest education  level: Not on file  Occupational History  . Occupation: Retired  Scientific laboratory technician  . Financial resource strain: Not on file  . Food insecurity:    Worry: Not on file    Inability: Not on file  . Transportation needs:    Medical: Not on file    Non-medical: Not on file  Tobacco Use  . Smoking status: Never Smoker  . Smokeless tobacco: Never Used  Substance and Sexual Activity  . Alcohol use: No  . Drug use: No  . Sexual activity: Not on file  Lifestyle  . Physical activity:    Days per week: Not on file    Minutes per session: Not on file  . Stress: Not on file  Relationships  . Social connections:    Talks on phone: Not on file    Gets together: Not on file    Attends religious service: Not on file    Active member of club or organization: Not on file    Attends meetings of clubs or organizations: Not on file    Relationship status: Not on file  Other Topics Concern  . Not on file  Social History Narrative  . Not on file    Vitals:   10/30/17 1053  BP: 130/72  Pulse: 95  Resp: 12  Temp: 98.9 F (37.2 C)  SpO2: 97%   Body mass index is 24.53 kg/m.   Physical Exam  Nursing note and vitals reviewed. Constitutional: She is oriented to person, place, and time. She appears well-developed and well-nourished. She does not appear ill. No distress.  HENT:  Head: Normocephalic and atraumatic.  Right Ear: Tympanic membrane, external ear and ear canal normal.  Left Ear: External ear and ear canal normal. No tenderness. No mastoid tenderness. Tympanic membrane is erythematous and bulging.  Nose: Rhinorrhea present. Right sinus exhibits no maxillary sinus tenderness and no frontal sinus tenderness. Left sinus exhibits no maxillary sinus tenderness (Mild tendernes upon pressure.) and no frontal sinus tenderness.  Mouth/Throat: Oropharynx is clear and moist and mucous membranes are normal.  Mildly hypertrophic turbinates. Postnasal drainage. Normal sinus transillumination,  bilateral. Moderate dysphonia. Otherwise normal gross hearing, bilateral.  Eyes: Pupils are equal, round, and reactive to light. Conjunctivae and EOM are normal. Left eye exhibits no discharge.  Mild tarsal conjunctival erythema of lower eye lid. Minimal upper and lower eye lid edema,no erythema.  Neck: No muscular tenderness present. No edema and no erythema present.  Cardiovascular: Normal rate and regular rhythm.  Murmur (sof SEM RUSB) heard. Respiratory: Effort normal and breath sounds normal. No stridor. No respiratory distress.  Lymphadenopathy:       Head (right side): No submandibular adenopathy present.       Head (left side): No submandibular adenopathy present.    She has no cervical adenopathy.  Neurological: She is alert and oriented to person, place, and time. She has normal strength. No cranial nerve deficit. Gait normal.  Skin: Skin is warm. No rash noted. No erythema.  Psychiatric: Her mood appears anxious.  Well groomed, good eye  contact.    ASSESSMENT AND PLAN:   Ms. Phung was seen today for eye pain and sinus drainage.  Diagnoses and all orders for this visit:  Non-recurrent acute serous otitis media of left ear  Left TM is partially erythematosus, it seems to be improving. She is now reporting a earache, some fullness sensation. For now I do not think antibiotic is needed, she was clearly instructed about warning signs. Auto inflation maneuvers were also recommended.  Some side effects of decongestant discussed, not recommended. I would like for her to follow with PCP in 10 days, before if needed.  URI, acute  Explained that this is most likely viral, in which case is self-limited. Nasal congestion and cough can last a few days and even weeks. Plenty of fluids, plain OTC Mucinex, and continue Flonase nasal spray.  Laryngitis, acute  Also viral. Voice rest recommended. Monitor for worrisome signs like stridor, wheezing, or difficulty swallowing among  some.  Acute conjunctivitis of left eye, unspecified acute conjunctivitis type  I also think this is viral, still I recommended topical antibiotic ointment, which she can apply at bedtime for 7 days. Monitor for warning signs. Explained that viral conjunctivitis will not improve with antibiotic treatment.  -     erythromycin [facility]) ophthalmic ointment; Place 1 application into the left eye at bedtime for 7 days.  Nasal sinus congestion  I do not think this is a bacterial sinusitis. Explained that sinus congestion could last a few days and even weeks after URI. Nasal irrigation with saline and Flonase nasal spray to continue. Prednisone treatment may help, some side effects discussed, she would like to try.  Follow-up with PCP in 10 days.  -     predniSONE (DELTASONE) 20 MG tablet; Take 2 tablets (40 mg total) by mouth daily with breakfast for 5 days.     -Ms. [name] to seek attention immediately if symptoms worsen.     Chayil Gantt G. [place], MD  [facility]. [place] office.

## 2017-10-30 NOTE — Patient Instructions (Signed)
  [name] I have seen you today for an acute visit.  A few things to remember from today's visit:   Non-recurrent acute serous otitis media of left ear  URI, acute  Laryngitis, acute  Acute conjunctivitis of left eye, unspecified acute conjunctivitis type - Plan: erythromycin [facility] [facility]) ophthalmic ointment  Nasal sinus congestion - Plan: predniSONE (DELTASONE) 20 MG tablet   Medications prescribed today are intended for short period of time and will not be refill upon request, a follow up appointment might be necessary to discuss continuation of of treatment if appropriate.   If you take please let me know. All these symptoms seem to be viral, self-limited. Cough and congestion can last a few weeks. Sinus irrigations with saline may help. Popping ears a few times per day. I do not recommend decongestants.  Please follow with your doctor in 10 days.  In general please monitor for signs of worsening symptoms and seek immediate medical attention if any concerning.    I hope you get better soon!

## 2017-11-06 ENCOUNTER — Ambulatory Visit: Payer: PPO

## 2017-11-09 ENCOUNTER — Ambulatory Visit: Payer: PPO

## 2017-11-09 DIAGNOSIS — M5441 Lumbago with sciatica, right side: Secondary | ICD-10-CM | POA: Diagnosis not present

## 2017-11-09 DIAGNOSIS — G8929 Other chronic pain: Secondary | ICD-10-CM

## 2017-11-09 DIAGNOSIS — M6281 Muscle weakness (generalized): Secondary | ICD-10-CM

## 2017-11-09 DIAGNOSIS — R252 Cramp and spasm: Secondary | ICD-10-CM

## 2017-11-09 NOTE — Therapy (Signed)
[facility] [address] [place], [place], [address] Phone: [phone]   Fax:  [phone]  Physical Therapy Treatment  Patient Details  Name: Gabrielle Vargas MRN: [id number] Date of Birth: August 31, 1950 Referring Provider: Stevie Kern, MD   Encounter Date: 11/09/2017  PT End of Session - 11/09/17 1222    Visit Number  3    Date for PT Re-Evaluation  12/11/17    Authorization Type  Medicare    PT Start Time  1146    PT Stop Time  1227    PT Time Calculation (min)  41 min    Activity Tolerance  Patient tolerated treatment well    Behavior During Therapy  [facility] for tasks assessed/performed       Past Medical History:  Diagnosis Date  . Arthritis    Osteo  . Asthma    Seasonal  . Diverticula, colon   . Hyperlipidemia   . Hypertension   . Hypothyroidism     Past Surgical History:  Procedure Laterality Date  . ABDOMINAL HYSTERECTOMY    . BREAST SURGERY    . CARPAL TUNNEL RELEASE    . CERVICAL DISC ARTHROPLASTY    . CESAREAN SECTION     x2  . left shoulder surgery    . right shoulder      There were no vitals filed for this visit.  Subjective Assessment - 11/09/17 1151    Subjective  Pt missed 2 weeks due to illness.  My back is feeling better.  I don't want the dry needling anymore, I felt better but I didn't like it.      Currently in Pain?  No/denies                       [facility] Adult PT Treatment/Exercise - 11/09/17 0001      Lumbar Exercises: Stretches   Single Knee to Chest Stretch  3 reps;20 seconds    Lower Trunk Rotation  3 reps;20 seconds    Piriformis Stretch  3 reps;20 seconds    Other Lumbar Stretch Exercise  hip flexor stretch      Lumbar Exercises: Aerobic   Nustep  Level 1x 8 minutes PT present to discuss progress      Lumbar Exercises: Sidelying   Clam  Both;20 reps      Lumbar Exercises: Prone   Straight Leg Raise  20 reps      Manual Therapy   Manual  Therapy  Soft tissue mobilization    Manual therapy comments  trigger point release to Rt gluteals with smooth and spiked ball               PT Short Term Goals - 10/16/17 1035      PT SHORT TERM GOAL #1   Title  be independent in initial HEP    Time  4    Period  Weeks    Status  New    Target Date  11/13/17      PT SHORT TERM GOAL #2   Title  report < or = to 5/10 Rt buttock/leg pain with sleep at night    Time  4    Period  Weeks    Status  New    Target Date  11/13/17      PT SHORT TERM GOAL #3   Title  report a 25% reducion in LBP/buttock pain with gardening activity    Time  4  Period  Weeks    Status  New    Target Date  11/13/17        PT Long Term Goals - 10/16/17 1038      PT LONG TERM GOAL #1   Title  be independent in advanced HEP    Time  8    Period  Weeks    Status  New    Target Date  12/11/17      PT LONG TERM GOAL #2   Title  report < or = to 3/10 Rt buttock/leg pain with sleep at night    Time  8    Period  Weeks    Status  New    Target Date  12/11/17      PT LONG TERM GOAL #3   Title  sleep a full night without waking due to pain    Time  8    Period  Weeks    Status  New    Target Date  12/11/17      PT LONG TERM GOAL #4   Title  report a 75% reduction in overall lumbar/hip  pain with gardening and household tasks    Time  8    Period  Weeks    Status  New    Target Date  12/11/17            Plan - 11/09/17 1154    Clinical Impression Statement  Pt missed 2 weeks of treatment due to illness.  Pt with reduced Rt buttock pain and reduced tension after dry needling last session but does not want to do it again.  Pt with mild Rt gluteal muscle tension and demonstrated improved tissue mobility with manual therapy today.  Pt is performing all HEP correclty and PT added new HEP for strength of gluteals and core.  Pt received body mechanics education and will work on applying these principles in the gym and at home.  Pt will  continue to benefit from skilled PT for hip flexibility, core and hip strength and manual therapy as needed to address trigger points.      Rehab Potential  Good    PT Frequency  2x / week    PT Duration  8 weeks    PT Treatment/Interventions  ADLs/Self Care Home Management;Cryotherapy;Electrical Stimulation;Ultrasound;Gait training;Stair training;Functional mobility training;Therapeutic activities;Therapeutic exercise;Patient/family education;Neuromuscular re-education;Manual techniques;Passive range of motion;Taping;Dry needling    PT Next Visit Plan  review new HEP, strength of hips and core, flexibility of hips, endurance and manual therapy as needed.      PT Home Exercise Plan  [email or web address]  access code    Recommended Other Services  initial certification is signed    Consulted and Agree with Plan of Care  Patient       Patient will benefit from skilled therapeutic intervention in order to improve the following deficits and impairments:  Improper body mechanics, Pain, Increased muscle spasms, Postural dysfunction, Decreased activity tolerance, Decreased strength, Impaired flexibility  Visit Diagnosis: Chronic right-sided low back pain with right-sided sciatica  Cramp and spasm  Muscle weakness (generalized)     Problem List Patient Active Problem List   Diagnosis Date Noted  . Essential hypertension 10/09/2017  . Low back pain radiating to right leg 10/09/2017  . Mild intermittent asthma with acute exacerbation 05/30/2017  . Hordeolum externum (stye) 07/28/2016  . Routine general medical examination at a health care facility 08/18/2015  . Diverticulitis of colon (without mention of hemorrhage)(562.11) 09/17/2013  .  HYPERTENSION, BENIGN ESSENTIAL 02/05/2010  . VAGINITIS, ATROPHIC, SEVERE 08/18/2008  . Hypothyroidism 05/09/2007  . Attention deficit disorder 05/09/2007     Sigurd Sos, PT 11/09/17 12:27 PM  [facility] [address] [place] [employer] [place], [place], [address] Phone: [phone]   Fax:  [phone]  Name: Gabrielle Vargas MRN: [id number] Date of Birth: 1951/03/18

## 2017-11-13 ENCOUNTER — Ambulatory Visit: Payer: PPO

## 2017-11-13 DIAGNOSIS — R252 Cramp and spasm: Secondary | ICD-10-CM

## 2017-11-13 DIAGNOSIS — M5441 Lumbago with sciatica, right side: Principal | ICD-10-CM

## 2017-11-13 DIAGNOSIS — G8929 Other chronic pain: Secondary | ICD-10-CM

## 2017-11-13 DIAGNOSIS — M6281 Muscle weakness (generalized): Secondary | ICD-10-CM

## 2017-11-13 NOTE — Therapy (Signed)
[facility] [address], [place], [place], [address] Phone: [phone]   Fax:  [phone]  Physical Therapy Treatment  Patient Details  Name: Gabrielle Vargas MRN: [id number] Date of Birth: [birth year] Referring Provider: Stevie Kern, MD   Encounter Date: 11/13/2017  PT End of Session - 11/13/17 1010    Visit Number  4    Date for PT Re-Evaluation  12/11/17    Authorization Type  Medicare    PT Start Time  0930    PT Stop Time  1018    PT Time Calculation (min)  48 min    Activity Tolerance  Patient tolerated treatment well    Behavior During Therapy  [facility] for tasks assessed/performed       Past Medical History:  Diagnosis Date  . Arthritis    Osteo  . Asthma    Seasonal  . Diverticula, colon   . Hyperlipidemia   . Hypertension   . Hypothyroidism     Past Surgical History:  Procedure Laterality Date  . ABDOMINAL HYSTERECTOMY    . BREAST SURGERY    . CARPAL TUNNEL RELEASE    . CERVICAL DISC ARTHROPLASTY    . CESAREAN SECTION     x2  . left shoulder surgery    . right shoulder      There were no vitals filed for this visit.  Subjective Assessment - 11/13/17 0934    Subjective  I will be planting my garden today.  I havent been hurting.      Currently in Pain?  No/denies                       [facility] Adult PT Treatment/Exercise - 11/13/17 0001      Lumbar Exercises: Stretches   Single Knee to Chest Stretch  3 reps;20 seconds    Lower Trunk Rotation  3 reps;20 seconds    Piriformis Stretch  3 reps;20 seconds    Other Lumbar Stretch Exercise  hip flexor stretch      Lumbar Exercises: Aerobic   Nustep  Level 1x 8 minutes PT present to discuss progress      Lumbar Exercises: Supine   Straight Leg Raise  20 reps      Lumbar Exercises: Sidelying   Clam  Both;20 reps      Lumbar Exercises: Prone   Straight Leg Raise  20 reps      Manual Therapy   Manual Therapy  Soft  tissue mobilization    Manual therapy comments  trigger point release to Rt gluteals with smooth and spiked ball               PT Short Term Goals - 11/13/17 3329      PT SHORT TERM GOAL #1   Title  be independent in initial HEP    Status  Achieved      PT SHORT TERM GOAL #2   Title  report < or = to 5/10 Rt buttock/leg pain with sleep at night    Status  Achieved      PT SHORT TERM GOAL #3   Title  report a 25% reducion in LBP/buttock pain with gardening activity    Baseline  pt has not been gardening- will do today    Time  4    Period  Weeks    Status  On-going        PT Long Term Goals - 10/16/17 1038  PT LONG TERM GOAL #1   Title  be independent in advanced HEP    Time  8    Period  Weeks    Status  New    Target Date  12/11/17      PT LONG TERM GOAL #2   Title  report < or = to 3/10 Rt buttock/leg pain with sleep at night    Time  8    Period  Weeks    Status  New    Target Date  12/11/17      PT LONG TERM GOAL #3   Title  sleep a full night without waking due to pain    Time  8    Period  Weeks    Status  New    Target Date  12/11/17      PT LONG TERM GOAL #4   Title  report a 75% reduction in overall lumbar/hip  pain with gardening and household tasks    Time  8    Period  Weeks    Status  New    Target Date  12/11/17            Plan - 11/13/17 8416    Clinical Impression Statement  Pt reports 80% overall pain reduction since the start of care.  Pt is independent and compliant with HEP for flexibility and strength.  Pt with minimal and much reduced trigger points in the Rt gluteals and low back.  Pt is planning to plant her garden today and will have her husband help with the lifting.  Pt is making posture and body mechanics modifications with home activities.  Pt with hip stiffness and core weakness and will continue to benefit from skilled PT for strength, flexibility and manual for trigger points.      Rehab Potential  Good    PT  Frequency  2x / week    PT Duration  8 weeks    PT Treatment/Interventions  ADLs/Self Care Home Management;Cryotherapy;Electrical Stimulation;Ultrasound;Gait training;Stair training;Functional mobility training;Therapeutic activities;Therapeutic exercise;Patient/family education;Neuromuscular re-education;Manual techniques;Passive range of motion;Taping;Dry needling    PT Next Visit Plan  core strength, flexibility, manual and mobility advancement.      PT Home Exercise Plan  [email or web address]  access code    Family Member Consulted  daughter       Patient will benefit from skilled therapeutic intervention in order to improve the following deficits and impairments:  Improper body mechanics, Pain, Increased muscle spasms, Postural dysfunction, Decreased activity tolerance, Decreased strength, Impaired flexibility  Visit Diagnosis: Chronic right-sided low back pain with right-sided sciatica  Cramp and spasm  Muscle weakness (generalized)     Problem List Patient Active Problem List   Diagnosis Date Noted  . Essential hypertension 10/09/2017  . Low back pain radiating to right leg 10/09/2017  . Mild intermittent asthma with acute exacerbation 05/30/2017  . Hordeolum externum (stye) 07/28/2016  . Routine general medical examination at a health care facility 08/18/2015  . Diverticulitis of colon (without mention of hemorrhage)(562.11) 09/17/2013  . HYPERTENSION, BENIGN ESSENTIAL 02/05/2010  . VAGINITIS, ATROPHIC, SEVERE 08/18/2008  . Hypothyroidism 05/09/2007  . Attention deficit disorder 05/09/2007     Sigurd Sos, PT 11/13/17 10:13 AM  [facility] [address], [place], [place], [address] Phone: [phone]   Fax:  [phone]  Name: Gabrielle Vargas MRN: [id number] Date of Birth: February 22, 1951

## 2017-11-16 ENCOUNTER — Ambulatory Visit: Payer: PPO | Attending: Family Medicine

## 2017-11-16 DIAGNOSIS — M6281 Muscle weakness (generalized): Secondary | ICD-10-CM | POA: Diagnosis not present

## 2017-11-16 DIAGNOSIS — M5441 Lumbago with sciatica, right side: Secondary | ICD-10-CM | POA: Diagnosis not present

## 2017-11-16 DIAGNOSIS — G8929 Other chronic pain: Secondary | ICD-10-CM | POA: Insufficient documentation

## 2017-11-16 DIAGNOSIS — R252 Cramp and spasm: Secondary | ICD-10-CM

## 2017-11-16 NOTE — Patient Instructions (Signed)
Access Code: [email or web address]  URL: [email or web address]  Date: 11/16/2017  Prepared by: [place] - 10 reps - 2 sets - 5 hold - 1x daily - 7x weekly  Clamshell with Resistance - 10 reps - 2 sets - 1x daily - 7x weekly   [facility] [address], [place], [place] [address] Phone # [phone] Fax [phone]

## 2017-11-16 NOTE — Therapy (Addendum)
[facility] [address] [place], [place], [address] Phone: [phone]   Fax:  [phone]  Physical Therapy Treatment  Patient Details  Name: Gabrielle Vargas MRN: [id number] Date of Birth: 1950-12-20 Referring Provider: Stevie Kern, MD   Encounter Date: 11/16/2017  PT End of Session - 11/16/17 1211    Visit Number  5    Date for PT Re-Evaluation  12/11/17    Authorization Type  Medicare    PT Start Time  1146    PT Stop Time  1220 no pain so less treatment needed    PT Time Calculation (min)  34 min    Activity Tolerance  Patient tolerated treatment well    Behavior During Therapy  [facility] for tasks assessed/performed       Past Medical History:  Diagnosis Date  . Arthritis    Osteo  . Asthma    Seasonal  . Diverticula, colon   . Hyperlipidemia   . Hypertension   . Hypothyroidism     Past Surgical History:  Procedure Laterality Date  . ABDOMINAL HYSTERECTOMY    . BREAST SURGERY    . CARPAL TUNNEL RELEASE    . CERVICAL DISC ARTHROPLASTY    . CESAREAN SECTION     x2  . left shoulder surgery    . right shoulder      There were no vitals filed for this visit.  Subjective Assessment - 11/16/17 1150    Subjective  Pt reports 90% overall improvement.  I want to be placed on hold.      Currently in Pain?  No/denies         [facility] PT Assessment - 11/16/17 0001      Assessment   Medical Diagnosis  low back pain, Rt hip pain      Cognition   Overall Cognitive Status  Within Functional Limits for tasks assessed      Observation/Other Assessments   Focus on Therapeutic Outcomes (FOTO)   17% limitation                   [facility] Adult PT Treatment/Exercise - 11/16/17 0001      Lumbar Exercises: Stretches   Piriformis Stretch  3 reps;20 seconds      Lumbar Exercises: Aerobic   Nustep  Level 1x 8 minutes PT present to discuss progress      Lumbar Exercises: Supine   Bridge  20  reps;5 seconds    Straight Leg Raise  20 reps      Lumbar Exercises: Sidelying   Clam  Both;20 reps             PT Education - 11/16/17 1208    Education provided  Yes    Education Details  [email or web address] access code    Person(s) Educated  Patient    Methods  Explanation;Demonstration;Handout    Comprehension  Verbalized understanding;Returned demonstration       PT Short Term Goals - 11/16/17 1155      PT SHORT TERM GOAL #1   Title  be independent in initial HEP    Status  Achieved      PT SHORT TERM GOAL #2   Title  report < or = to 5/10 Rt buttock/leg pain with sleep at night    Status  Achieved      PT SHORT TERM GOAL #3   Title  report a 25% reducion in LBP/buttock pain with gardening activity  Status  Achieved        PT Long Term Goals - 11/16/17 1156      PT LONG TERM GOAL #1   Title  be independent in advanced HEP    Time  8    Period  Weeks    Status  On-going      PT LONG TERM GOAL #2   Title  report < or = to 3/10 Rt buttock/leg pain with sleep at night    Status  Achieved      PT LONG TERM GOAL #3   Title  sleep a full night without waking due to pain    Status  Achieved      PT LONG TERM GOAL #4   Title  report a 75% reduction in overall lumbar/hip  pain with gardening and household tasks    Status  Achieved            Plan - 11/16/17 1202    Clinical Impression Statement  Pt reports 90% overall improvement in symptoms at this time.  Pt has returned to regular activity and asks for assistance with lifting heavy objects.  Pt has not return to playing golf and will return to doing this soon.  FOTO is improved to 17% limitation.  Pt is sleeping all night without increased pain.  Pt will be placed on hold and will return befor 12/11/17 if needed.  If not, PT will D/C.      Rehab Potential  Good    PT Frequency  2x / week    PT Duration  8 weeks    PT Treatment/Interventions  ADLs/Self Care Home Management;Cryotherapy;Electrical  Stimulation;Ultrasound;Gait training;Stair training;Functional mobility training;Therapeutic activities;Therapeutic exercise;Patient/family education;Neuromuscular re-education;Manual techniques;Passive range of motion;Taping;Dry needling    PT Next Visit Plan  Hold until 12/11/17    PT Home Exercise Plan  [email or web address]  access code    Consulted and Agree with Plan of Care  Patient       Patient will benefit from skilled therapeutic intervention in order to improve the following deficits and impairments:  Improper body mechanics, Pain, Increased muscle spasms, Postural dysfunction, Decreased activity tolerance, Decreased strength, Impaired flexibility  Visit Diagnosis: Chronic right-sided low back pain with right-sided sciatica  Cramp and spasm  Muscle weakness (generalized)     Problem List Patient Active Problem List   Diagnosis Date Noted  . Essential hypertension 10/09/2017  . Low back pain radiating to right leg 10/09/2017  . Mild intermittent asthma with acute exacerbation 05/30/2017  . Hordeolum externum (stye) 07/28/2016  . Routine general medical examination at a health care facility 08/18/2015  . Diverticulitis of colon (without mention of hemorrhage)(562.11) 09/17/2013  . HYPERTENSION, BENIGN ESSENTIAL 02/05/2010  . VAGINITIS, ATROPHIC, SEVERE 08/18/2008  . Hypothyroidism 05/09/2007  . Attention deficit disorder 05/09/2007     Sigurd Sos, PT 11/16/17 12:17 PM PHYSICAL THERAPY DISCHARGE SUMMARY  Visits from Start of Care: 5  Current functional level related to goals / functional outcomes: See above for current status.  Pt requested to be placed on hold after visit on 11/16/17 as she was doing well.  Pt didn't return to PT.     Remaining deficits: See above.  No deficits reported at final session.     Education / Equipment: HEP, Economist education Plan: Patient agrees to discharge.  Patient goals were partially met. Patient is being discharged due to being  pleased with the current functional level.  ?????  Sigurd Sos, PT 12/12/17 10:29 AM  [facility] [address] [place], [place], [address] Phone: [phone]   Fax:  [phone]  Name: JONNETTE NUON MRN: [id number] Date of Birth: 10/09/1950

## 2017-12-21 ENCOUNTER — Other Ambulatory Visit: Payer: Self-pay | Admitting: Family Medicine

## 2018-04-23 DIAGNOSIS — H524 Presbyopia: Secondary | ICD-10-CM | POA: Diagnosis not present

## 2018-05-07 ENCOUNTER — Telehealth: Payer: Self-pay | Admitting: Family Medicine

## 2018-05-07 NOTE — Telephone Encounter (Signed)
Ok for refill? Please advise 

## 2018-05-07 NOTE — Telephone Encounter (Signed)
Patient states she needs a refill on her Adderall.  1-2 days supply left.   Pharmacy- [facility] [address] for Adderall only

## 2018-05-08 ENCOUNTER — Other Ambulatory Visit: Payer: Self-pay | Admitting: Family Medicine

## 2018-05-08 MED ORDER — AMPHETAMINE-DEXTROAMPHETAMINE 20 MG PO TABS: 20.0000 mg | ORAL_TABLET | Freq: Two times a day (BID) | ORAL | 0 refills | Status: DC

## 2018-05-08 NOTE — Telephone Encounter (Signed)
done

## 2018-06-21 ENCOUNTER — Telehealth: Payer: Self-pay | Admitting: Adult Health

## 2018-06-21 NOTE — Telephone Encounter (Signed)
Patient needs Rx refilled  amphetamine-dextroamphetamine (ADDERALL) 20 MG tablet   Sent to:  [facility] [place], [place] AT [place] [phone] (Phone) [phone] (Fax)    Patient has a [facility] appointment March 3rd, 2019

## 2018-06-22 MED ORDER — AMPHETAMINE-DEXTROAMPHETAMINE 20 MG PO TABS: 20.0000 mg | ORAL_TABLET | Freq: Two times a day (BID) | ORAL | 0 refills | Status: DC

## 2018-06-22 NOTE — Addendum Note (Signed)
Addended by: Apolinar Junes on: 06/22/2018 07:19 AM   Modules accepted: Orders

## 2018-09-18 ENCOUNTER — Encounter: Payer: Self-pay | Admitting: Adult Health

## 2018-09-18 ENCOUNTER — Ambulatory Visit (INDEPENDENT_AMBULATORY_CARE_PROVIDER_SITE_OTHER): Payer: PPO | Admitting: Adult Health

## 2018-09-18 VITALS — BP 136/84 | Temp 98.3°F | Wt 123.0 lb

## 2018-09-18 DIAGNOSIS — R918 Other nonspecific abnormal finding of lung field: Secondary | ICD-10-CM

## 2018-09-18 DIAGNOSIS — Z0000 Encounter for general adult medical examination without abnormal findings: Secondary | ICD-10-CM

## 2018-09-18 DIAGNOSIS — I10 Essential (primary) hypertension: Secondary | ICD-10-CM | POA: Diagnosis not present

## 2018-09-18 DIAGNOSIS — F909 Attention-deficit hyperactivity disorder, unspecified type: Secondary | ICD-10-CM | POA: Diagnosis not present

## 2018-09-18 DIAGNOSIS — E039 Hypothyroidism, unspecified: Secondary | ICD-10-CM

## 2018-09-18 NOTE — Progress Notes (Signed)
Patient presents to clinic today to establish care. She is a pleasant 68 year old female who  has a past medical history of Arthritis, Asthma, Diverticula, colon, Hyperlipidemia, Hypertension, and Hypothyroidism.  She is a former patient of Dr. Sherren Mocha  Her last CPE was in 3/20109   Acute Concerns: Establish Care  Chronic Issues: Hypothyroidism - Controlled with Synthroid 100 mcg.  Lab Results  Component Value Date   TSH 0.51 10/09/2017    ADD - is currently prescribed Adderall 20 mg BID   Hypertension - Takes HCTZ 25 mg   Pulmonary Nodule - was seen incidentally on CT abdomen and Pelvis iin 2015. Recommended follow up in one year. She would like to do this and make sure there is no issues going. She denies any issues.   Health Maintenance: Dental --UTD Vision -- UTD Immunizations -- UTD Colonoscopy -- UTD - last was 2015 - 10 yr plan  Mammogram -- UTD PAP --  No longer needed - Hysterectomy  Bone Density --  Diet: Eats healthy  Exercise: Walks almost daily   Past Medical History:  Diagnosis Date  . Arthritis    Osteo  . Asthma    Seasonal  . Diverticula, colon   . Hyperlipidemia   . Hypertension   . Hypothyroidism     Past Surgical History:  Procedure Laterality Date  . ABDOMINAL HYSTERECTOMY    . BREAST SURGERY    . CARPAL TUNNEL RELEASE    . CERVICAL DISC ARTHROPLASTY    . CESAREAN SECTION     x2  . left shoulder surgery    . right shoulder      Current Outpatient Medications on File Prior to Visit  Medication Sig Dispense Refill  . amphetamine-dextroamphetamine (ADDERALL) 20 MG tablet Take 1 tablet (20 mg total) by mouth 2 (two) times daily. 60 tablet 0  . amphetamine-dextroamphetamine (ADDERALL) 20 MG tablet Take 1 tablet (20 mg total) by mouth 2 (two) times daily. 60 tablet 0  . amphetamine-dextroamphetamine (ADDERALL) 20 MG tablet Take 1 tablet (20 mg total) by mouth 2 (two) times daily. 60 tablet 0  . aspirin 81 MG tablet Take 81 mg by mouth  daily.    . hydrochlorothiazide (HYDRODIURIL) 25 MG tablet TAKE 1 TABLET(25 MG) BY MOUTH DAILY 90 tablet 4  . hydrochlorothiazide (HYDRODIURIL) 25 MG tablet TAKE 1 TABLET(25 MG) BY MOUTH DAILY 90 tablet 3  . ibuprofen (ADVIL,MOTRIN) 200 MG tablet Take 200 mg by mouth every 6 (six) hours as needed.    Marland Kitchen SYNTHROID 100 MCG tablet Take 1 tablet (100 mcg total) by mouth daily. 100 tablet 4   No current facility-administered medications on file prior to visit.     Allergies  Allergen Reactions  . Latex     REACTION: rash/hives  . Oxycodone-Acetaminophen     REACTION: nausea    Family History  Problem Relation Age of Onset  . Kidney disease Father   . Diabetes Father   . Heart disease Father   . Melanoma Mother   . Colon cancer Neg Hx   . Esophageal cancer Neg Hx   . Rectal cancer Neg Hx   . Stomach cancer Neg Hx     Social History   Socioeconomic History  . Marital status: Married    Spouse name: Not on file  . Number of children: 2  . Years of education: Not on file  . Highest education level: Not on file  Occupational History  . Occupation: Retired  Social Needs  . Financial resource strain: Not on file  . Food insecurity:    Worry: Not on file    Inability: Not on file  . Transportation needs:    Medical: Not on file    Non-medical: Not on file  Tobacco Use  . Smoking status: Never Smoker  . Smokeless tobacco: Never Used  Substance and Sexual Activity  . Alcohol use: No  . Drug use: No  . Sexual activity: Not on file  Lifestyle  . Physical activity:    Days per week: Not on file    Minutes per session: Not on file  . Stress: Not on file  Relationships  . Social connections:    Talks on phone: Not on file    Gets together: Not on file    Attends religious service: Not on file    Active member of club or organization: Not on file    Attends meetings of clubs or organizations: Not on file    Relationship status: Not on file  . Intimate partner violence:     Fear of current or ex partner: Not on file    Emotionally abused: Not on file    Physically abused: Not on file    Forced sexual activity: Not on file  Other Topics Concern  . Not on file  Social History Narrative  . Not on file    Review of Systems  Constitutional: Negative.   HENT: Negative.   Eyes: Negative.   Respiratory: Negative.   Cardiovascular: Negative.   Gastrointestinal: Negative.   Genitourinary: Negative.   Musculoskeletal: Negative.   Skin: Negative.   Neurological: Negative.   Endo/Heme/Allergies: Negative.   Psychiatric/Behavioral: Negative.       There were no vitals taken for this visit.  Physical Exam Vitals signs and nursing note reviewed.  Constitutional:      General: She is not in acute distress.    Appearance: Normal appearance. She is well-developed. She is obese.  HENT:     Head: Normocephalic and atraumatic.     Right Ear: Tympanic membrane, ear canal and external ear normal. There is no impacted cerumen.     Left Ear: Tympanic membrane, ear canal and external ear normal. There is no impacted cerumen.     Nose: Nose normal. No congestion or rhinorrhea.     Mouth/Throat:     Mouth: Mucous membranes are moist.     Pharynx: Oropharynx is clear. No oropharyngeal exudate.  Eyes:     General:        Right eye: No discharge.        Left eye: No discharge.     Conjunctiva/sclera: Conjunctivae normal.     Pupils: Pupils are equal, round, and reactive to light.  Neck:     Musculoskeletal: Normal range of motion and neck supple. No neck rigidity or muscular tenderness.     Thyroid: No thyromegaly.     Vascular: No carotid bruit.     Trachea: No tracheal deviation.  Cardiovascular:     Rate and Rhythm: Normal rate and regular rhythm.     Heart sounds: Normal heart sounds. No murmur. No friction rub. No gallop.   Pulmonary:     Effort: Pulmonary effort is normal. No respiratory distress.     Breath sounds: Normal breath sounds. No stridor. No  wheezing or rales.  Chest:     Chest wall: No tenderness.  Abdominal:     General: Bowel sounds are normal. There is  no distension.     Palpations: Abdomen is soft. There is no mass.     Tenderness: There is no abdominal tenderness. There is no right CVA tenderness, left CVA tenderness, guarding or rebound.     Hernia: No hernia is present.  Musculoskeletal: Normal range of motion.        General: No swelling, tenderness, deformity or signs of injury.     Right lower leg: No edema.     Left lower leg: No edema.  Lymphadenopathy:     Cervical: No cervical adenopathy.  Skin:    General: Skin is warm and dry.     Capillary Refill: Capillary refill takes less than 2 seconds.     Coloration: Skin is not jaundiced or pale.     Findings: No bruising, erythema, lesion or rash.  Neurological:     General: No focal deficit present.     Mental Status: She is alert and oriented to person, place, and time. Mental status is at baseline.     Cranial Nerves: No cranial nerve deficit.     Coordination: Coordination normal.  Psychiatric:        Mood and Affect: Mood normal.        Behavior: Behavior normal.        Thought Content: Thought content normal.        Judgment: Judgment normal.    Assessment/Plan: 1. Encounter for medical examination to establish care - Follow up in 1-2 months for CPE or sooner if needed   2. Abnormal findings on diagnostic imaging of lung  - CT Chest Wo Contrast; Future  3. Attention deficit hyperactivity disorder (ADHD), unspecified ADHD type - Will hopefully be able to wean in the future   4. Essential hypertension - Well controlled.  - No change in medications   5. Hypothyroidism, unspecified type - Continue with synthroid 100 mcg   Gabrielle Peng, NP

## 2018-10-10 ENCOUNTER — Other Ambulatory Visit: Payer: Self-pay

## 2018-10-10 ENCOUNTER — Ambulatory Visit
Admission: RE | Admit: 2018-10-10 | Discharge: 2018-10-10 | Disposition: A | Discharge status: Home / Self Care | Payer: PPO | Source: Ambulatory Visit | Attending: Adult Health | Admitting: Adult Health

## 2018-10-10 ENCOUNTER — Other Ambulatory Visit: Payer: Self-pay | Admitting: Adult Health

## 2018-10-10 DIAGNOSIS — Z1231 Encounter for screening mammogram for malignant neoplasm of breast: Secondary | ICD-10-CM

## 2018-10-10 DIAGNOSIS — R911 Solitary pulmonary nodule: Secondary | ICD-10-CM | POA: Diagnosis not present

## 2018-10-10 DIAGNOSIS — R918 Other nonspecific abnormal finding of lung field: Secondary | ICD-10-CM

## 2018-10-11 ENCOUNTER — Other Ambulatory Visit: Payer: Self-pay | Admitting: Adult Health

## 2018-10-11 ENCOUNTER — Other Ambulatory Visit: Payer: PPO

## 2018-10-11 DIAGNOSIS — R918 Other nonspecific abnormal finding of lung field: Secondary | ICD-10-CM

## 2018-10-18 ENCOUNTER — Telehealth: Payer: Self-pay | Admitting: Family Medicine

## 2018-10-18 NOTE — Telephone Encounter (Signed)
Left a message for a return call.  Was notified the pt was in the shower.  Advised she call back if needed.

## 2018-10-18 NOTE — Telephone Encounter (Signed)
Copied from [place] [id number]. Topic: Quick Communication - Other Results (Clinic Use ONLY) >> Oct 16, 2018  2:57 PM Miles Costain T, [place] wrote: Called patient to inform them of CT results from 10/10/2018. When patient returns call, triage nurse may disclose results. >> Oct 17, 2018  3:48 PM Percell Belt A wrote: Pt called back and left a vm on the general mailbox- she was returning the call about her CT results.  She has some questions about the resutls  Best number - [phone]

## 2018-10-19 NOTE — Telephone Encounter (Signed)
Spoke to [facility] and informed her that the nodule does not appear to be cancerous but will repeat the CT in 6 months as a precautionary measure.  She has agreed.  Tommi Rumps has made a future order.  Nothing further needed.

## 2018-10-25 ENCOUNTER — Encounter: Payer: PPO | Admitting: Adult Health

## 2018-11-21 ENCOUNTER — Other Ambulatory Visit: Payer: Self-pay | Admitting: Adult Health

## 2018-11-22 ENCOUNTER — Telehealth: Payer: Self-pay | Admitting: Adult Health

## 2018-11-22 MED ORDER — AMPHETAMINE-DEXTROAMPHETAMINE 20 MG PO TABS: 20.0000 mg | ORAL_TABLET | Freq: Two times a day (BID) | ORAL | 0 refills | Status: DC

## 2018-11-22 NOTE — Telephone Encounter (Signed)
Copied from [place] [phone]. Topic: Quick Communication - Rx Refill/Question >> Nov 22, 2018 10:44 AM Celene Kras A wrote: Medication: amphetamine-dextroamphetamine (ADDERALL) 20 MG tablet  Has the patient contacted their pharmacy? Yes.   (Agent: If no, request that the patient contact the pharmacy for the refill.) (Agent: If yes, when and what did the pharmacy advise?)  Preferred Pharmacy (with phone number or street name): [facility] [employer], [place] - [address] AT [address] [place] [address] Phone: [phone] Fax: [phone] Not a 24 hour pharmacy; exact hours not known    Agent: Please be advised that RX refills may take up to 3 business days. We ask that you follow-up with your pharmacy.

## 2018-11-22 NOTE — Telephone Encounter (Signed)
Sent to the pharmacy by e-scribe. 

## 2018-11-28 ENCOUNTER — Other Ambulatory Visit: Payer: Self-pay | Admitting: Family Medicine

## 2018-11-28 DIAGNOSIS — E039 Hypothyroidism, unspecified: Secondary | ICD-10-CM

## 2018-11-28 MED ORDER — SYNTHROID 100 MCG PO TABS: 100.0000 ug | ORAL_TABLET | Freq: Every day | ORAL | 0 refills | Status: DC

## 2018-11-28 NOTE — Telephone Encounter (Signed)
Sent to the pharmacy by e-scribe. 

## 2018-12-03 ENCOUNTER — Ambulatory Visit: Payer: PPO

## 2019-01-11 ENCOUNTER — Other Ambulatory Visit: Payer: Self-pay

## 2019-01-11 ENCOUNTER — Ambulatory Visit
Admission: RE | Admit: 2019-01-11 | Discharge: 2019-01-11 | Disposition: A | Discharge status: Home / Self Care | Payer: PPO | Source: Ambulatory Visit | Attending: Adult Health | Admitting: Adult Health

## 2019-01-11 DIAGNOSIS — Z1231 Encounter for screening mammogram for malignant neoplasm of breast: Secondary | ICD-10-CM

## 2019-01-31 ENCOUNTER — Ambulatory Visit (INDEPENDENT_AMBULATORY_CARE_PROVIDER_SITE_OTHER): Payer: PPO | Admitting: Adult Health

## 2019-01-31 ENCOUNTER — Encounter: Payer: Self-pay | Admitting: Adult Health

## 2019-01-31 ENCOUNTER — Other Ambulatory Visit: Payer: Self-pay

## 2019-01-31 VITALS — BP 124/80 | Temp 98.3°F | Ht 58.25 in | Wt 116.0 lb

## 2019-01-31 DIAGNOSIS — E2839 Other primary ovarian failure: Secondary | ICD-10-CM

## 2019-01-31 DIAGNOSIS — I10 Essential (primary) hypertension: Secondary | ICD-10-CM

## 2019-01-31 DIAGNOSIS — Z0000 Encounter for general adult medical examination without abnormal findings: Secondary | ICD-10-CM | POA: Diagnosis not present

## 2019-01-31 DIAGNOSIS — E039 Hypothyroidism, unspecified: Secondary | ICD-10-CM | POA: Diagnosis not present

## 2019-01-31 DIAGNOSIS — F909 Attention-deficit hyperactivity disorder, unspecified type: Secondary | ICD-10-CM

## 2019-01-31 LAB — COMPREHENSIVE METABOLIC PANEL
ALT: 15 U/L (ref 0–35)
AST: 18 U/L (ref 0–37)
Albumin: 4.7 g/dL (ref 3.5–5.2)
Alkaline Phosphatase: 108 U/L (ref 39–117)
BUN: 18 mg/dL (ref 6–23)
CO2: 31 mEq/L (ref 19–32)
Calcium: 9.6 mg/dL (ref 8.4–10.5)
Chloride: 99 mEq/L (ref 96–112)
Creatinine, Ser: 0.7 mg/dL (ref 0.40–1.20)
GFR: 82.65 mL/min (ref >60.00)
Glucose, Bld: 102 mg/dL — ABNORMAL HIGH (ref 70–99)
Potassium: 3.5 mEq/L (ref 3.5–5.1)
Sodium: 140 mEq/L (ref 135–145)
Total Bilirubin: 0.6 mg/dL (ref 0.2–1.2)
Total Protein: 7.1 g/dL (ref 6.0–8.3)

## 2019-01-31 LAB — CBC WITH DIFFERENTIAL/PLATELET
Basophils Absolute: 0 10*3/uL (ref 0.0–0.1)
Basophils Relative: 0.8 % (ref 0.0–3.0)
Eosinophils Absolute: 0.1 10*3/uL (ref 0.0–0.7)
Eosinophils Relative: 1.3 % (ref 0.0–5.0)
HCT: 43.2 % (ref 36.0–46.0)
Hemoglobin: 14.5 g/dL (ref 12.0–15.0)
Lymphocytes Relative: 35.1 % (ref 12.0–46.0)
Lymphs Abs: 2 10*3/uL (ref 0.7–4.0)
MCHC: 33.6 g/dL (ref 30.0–36.0)
MCV: 86.1 fl (ref 78.0–100.0)
Monocytes Absolute: 0.3 10*3/uL (ref 0.1–1.0)
Monocytes Relative: 5.3 % (ref 3.0–12.0)
Neutro Abs: 3.3 10*3/uL (ref 1.4–7.7)
Neutrophils Relative %: 57.5 % (ref 43.0–77.0)
Platelets: 263 10*3/uL (ref 150.0–400.0)
RBC: 5.01 Mil/uL (ref 3.87–5.11)
RDW: 13.3 % (ref 11.5–15.5)
WBC: 5.8 10*3/uL (ref 4.0–10.5)

## 2019-01-31 LAB — LIPID PANEL
Cholesterol: 238 mg/dL — ABNORMAL HIGH (ref 0–200)
HDL: 61.5 mg/dL (ref >39.00)
LDL Cholesterol: 150 mg/dL — ABNORMAL HIGH (ref 0–99)
NonHDL: 176.39
Total CHOL/HDL Ratio: 4
Triglycerides: 133 mg/dL (ref 0.0–149.0)
VLDL: 26.6 mg/dL (ref 0.0–40.0)

## 2019-01-31 LAB — TSH: TSH: 0.13 u[IU]/mL — ABNORMAL LOW (ref 0.35–4.50)

## 2019-01-31 NOTE — Patient Instructions (Signed)
It was great seeing you today   We will follow up with you regarding your blood work   Please schedule your bone density screen   Schedule your bone density test at check out desk. You may also call directly to X-ray at [phone] to schedule an appointment that is convenient for you.  - located [address] across the [address] from [place] - in the basement - you do need an appointment for the bone density tests.

## 2019-01-31 NOTE — Progress Notes (Signed)
Subjective:    Patient ID: Gabrielle Vargas, female    DOB: Nov 26, 1950, 68 y.o.   MRN: [id number]  HPI  Patient presents for yearly preventative medicine examination. She is a pleasant 68 year old female who  has a past medical history of Arthritis, Asthma, Diverticula, colon, Hyperlipidemia, Hypertension, and Hypothyroidism.  Hypothyroidism - Takes Synthroid 100 mcg  Lab Results  Component Value Date   TSH 0.51 10/09/2017   ADD- prescribed Adderall 20 mg BID. Per [place] controlled substance database last refill was on 01/25/2019 for 30 days.  No discrepancies noted. She feels well controlled.   Hypertension - Takes HCTZ 25 mg daily BP Readings from Last 3 Encounters:  01/31/19 124/80  09/18/18 136/84  10/30/17 130/72   Pulmonary Nodule -was found incidentally on CT abdomen pelvis in 2015. She is scheduled for repeat CT in September 2020.   All immunizations and health maintenance protocols were reviewed with the patient and needed orders were placed.  Appropriate screening laboratory values were ordered for the patient including screening of hyperlipidemia, renal function and hepatic function.  Medication reconciliation,  past medical history, social history, problem list and allergies were reviewed in detail with the patient  Goals were established with regard to weight loss, exercise, and  diet in compliance with medications  End of life planning was discussed.  She is up-to-date on routine screening mammogram, colonoscopy, dental, and vision screens.  She is due for bone density screen  Review of Systems  Constitutional: Negative.   HENT: Negative.   Eyes: Negative.   Respiratory: Negative.   Cardiovascular: Negative.   Gastrointestinal: Negative.   Endocrine: Negative.   Genitourinary: Negative.   Musculoskeletal: Negative.   Skin: Negative.   Allergic/Immunologic: Negative.   Neurological: Negative.   Hematological: Negative.   Psychiatric/Behavioral: Negative.     Past Medical History:  Diagnosis Date  . Arthritis    Osteo  . Asthma    Seasonal  . Diverticula, colon   . Hyperlipidemia   . Hypertension   . Hypothyroidism     Social History   Socioeconomic History  . Marital status: Married    Spouse name: Not on file  . Number of children: 2  . Years of education: Not on file  . Highest education level: Not on file  Occupational History  . Occupation: Retired  Scientific laboratory technician  . Financial resource strain: Not on file  . Food insecurity    Worry: Not on file    Inability: Not on file  . Transportation needs    Medical: Not on file    Non-medical: Not on file  Tobacco Use  . Smoking status: Never Smoker  . Smokeless tobacco: Never Used  Substance and Sexual Activity  . Alcohol use: No  . Drug use: No  . Sexual activity: Not on file  Lifestyle  . Physical activity    Days per week: Not on file    Minutes per session: Not on file  . Stress: Not on file  Relationships  . Social Herbalist on phone: Not on file    Gets together: Not on file    Attends religious service: Not on file    Active member of club or organization: Not on file    Attends meetings of clubs or organizations: Not on file    Relationship status: Not on file  . Intimate partner violence    Fear of current or ex partner: Not on file  Emotionally abused: Not on file    Physically abused: Not on file    Forced sexual activity: Not on file  Other Topics Concern  . Not on file  Social History Narrative   Retired   Married       Past Surgical History:  Procedure Laterality Date  . ABDOMINAL HYSTERECTOMY    . BREAST SURGERY    . CARPAL TUNNEL RELEASE Bilateral   . CERVICAL DISC ARTHROPLASTY    . CESAREAN SECTION     x2  . left shoulder surgery    . MENISCUS REPAIR Right 2017  . REDUCTION MAMMAPLASTY Bilateral   . right shoulder    . TONSILLECTOMY      Family History  Problem Relation Age of Onset  . Kidney disease Father   .  Diabetes Father   . Heart disease Father   . Melanoma Mother   . Colon cancer Neg Hx   . Esophageal cancer Neg Hx   . Rectal cancer Neg Hx   . Stomach cancer Neg Hx     Allergies  Allergen Reactions  . Latex     REACTION: rash/hives  . Oxycodone-Acetaminophen     REACTION: nausea    Current Outpatient Medications on File Prior to Visit  Medication Sig Dispense Refill  . amphetamine-dextroamphetamine (ADDERALL) 20 MG tablet Take 1 tablet (20 mg total) by mouth 2 (two) times daily. 60 tablet 0  . amphetamine-dextroamphetamine (ADDERALL) 20 MG tablet Take 1 tablet (20 mg total) by mouth 2 (two) times daily. 60 tablet 0  . amphetamine-dextroamphetamine (ADDERALL) 20 MG tablet Take 1 tablet (20 mg total) by mouth 2 (two) times daily. 60 tablet 0  . hydrochlorothiazide (HYDRODIURIL) 25 MG tablet TAKE 1 TABLET(25 MG) BY MOUTH DAILY 90 tablet 3  . ibuprofen (ADVIL,MOTRIN) 200 MG tablet Take 200 mg by mouth every 6 (six) hours as needed.    Marland Kitchen SYNTHROID 100 MCG tablet Take 1 tablet (100 mcg total) by mouth daily. 90 tablet 0   No current facility-administered medications on file prior to visit.     BP 124/80   Temp 98.3 F (36.8 C)   Ht 4' 10.25" (1.48 m)   Wt 116 lb (52.6 kg)   BMI 24.04 kg/m       Objective:   Physical Exam Vitals signs and nursing note reviewed.  Constitutional:      General: She is not in acute distress.    Appearance: Normal appearance. She is normal weight. She is not ill-appearing or diaphoretic.  HENT:     Head: Normocephalic and atraumatic.     Right Ear: Tympanic membrane, ear canal and external ear normal. There is no impacted cerumen.     Left Ear: Tympanic membrane, ear canal and external ear normal. There is no impacted cerumen.     Nose: Nose normal. No congestion or rhinorrhea.     Mouth/Throat:     Mouth: Mucous membranes are moist.     Pharynx: Oropharynx is clear. No oropharyngeal exudate or posterior oropharyngeal erythema.  Eyes:      General: No scleral icterus.       Right eye: No discharge.        Left eye: No discharge.     Extraocular Movements: Extraocular movements intact.     Conjunctiva/sclera: Conjunctivae normal.     Pupils: Pupils are equal, round, and reactive to light.  Neck:     Musculoskeletal: Normal range of motion and neck supple.  Thyroid: No thyromegaly.     Vascular: No carotid bruit or JVD.     Trachea: No tracheal deviation.  Cardiovascular:     Rate and Rhythm: Normal rate and regular rhythm.     Pulses: Normal pulses.     Heart sounds: Normal heart sounds. No murmur. No friction rub. No gallop.   Pulmonary:     Effort: Pulmonary effort is normal. No respiratory distress.     Breath sounds: Normal breath sounds. No stridor. No wheezing, rhonchi or rales.  Chest:     Chest wall: No tenderness.  Abdominal:     General: Abdomen is flat. Bowel sounds are normal. There is no distension.     Palpations: Abdomen is soft. There is no mass.     Tenderness: There is no abdominal tenderness. There is no right CVA tenderness, left CVA tenderness, guarding or rebound.     Hernia: No hernia is present.  Musculoskeletal: Normal range of motion.        General: No swelling, tenderness, deformity or signs of injury.     Right lower leg: No edema.     Left lower leg: No edema.  Lymphadenopathy:     Cervical: No cervical adenopathy.  Skin:    General: Skin is warm and dry.     Capillary Refill: Capillary refill takes less than 2 seconds.     Coloration: Skin is not jaundiced or pale.     Findings: No bruising, erythema, lesion or rash.  Neurological:     General: No focal deficit present.     Mental Status: She is alert and oriented to person, place, and time. Mental status is at baseline.     Cranial Nerves: No cranial nerve deficit.     Sensory: No sensory deficit.     Motor: No weakness or abnormal muscle tone.     Coordination: Coordination normal.     Gait: Gait normal.     Deep Tendon  Reflexes: Reflexes normal.  Psychiatric:        Mood and Affect: Mood normal.        Thought Content: Thought content normal.        Judgment: Judgment normal.       Assessment & Plan:  1. Routine general medical examination at a health care facility - One year follow up  - CBC with Differential/Platelet - Comprehensive metabolic panel - Lipid panel - TSH - DG Bone Density; Future  2. Essential hypertension - Well controlled. No change in medication  - CBC with Differential/Platelet - Comprehensive metabolic panel - Lipid panel - TSH - DG Bone Density; Future  3. Attention deficit hyperactivity disorder (ADHD), unspecified ADHD type - Continue with current dose of adderall   4. Hypothyroidism, unspecified type - Consider increase in synthroid  - CBC with Differential/Platelet - Comprehensive metabolic panel - Lipid panel - TSH - DG Bone Density; Future  5. Estrogen deficiency  - DG Bone Density; Future  [name], NP

## 2019-02-01 ENCOUNTER — Telehealth: Payer: Self-pay | Admitting: Adult Health

## 2019-02-01 ENCOUNTER — Other Ambulatory Visit: Payer: Self-pay | Admitting: Adult Health

## 2019-02-01 DIAGNOSIS — E039 Hypothyroidism, unspecified: Secondary | ICD-10-CM

## 2019-02-01 MED ORDER — SIMVASTATIN 10 MG PO TABS: 10.0000 mg | ORAL_TABLET | ORAL | 0 refills | Status: DC

## 2019-02-01 NOTE — Telephone Encounter (Signed)
Thank you :)

## 2019-02-01 NOTE — Telephone Encounter (Signed)
Per Tommi Rumps, ok to send in simvastatin 10 mg to take 1 tab every other day.  Medication sent to the pharmacy by e-scribe.  Nothing further needed at this time.

## 2019-02-01 NOTE — Telephone Encounter (Signed)
Pt received labs in [place] and stated that she does not want to take medication suggested. Pt states that she would like to try something different. Pt would like a call back from the nurse.

## 2019-02-01 NOTE — Addendum Note (Signed)
Addended by: Miles Costain T on: 02/01/2019 01:30 PM   Modules accepted: Orders

## 2019-02-01 NOTE — Telephone Encounter (Signed)
Pharmacy: [facility] and [place]. She is hesitant to try statin therapy.  Has had muscle/joint pain in the past Advised that she could try every other day and she did agree Please send in for patient to take every other day. Thanks

## 2019-02-12 ENCOUNTER — Ambulatory Visit (INDEPENDENT_AMBULATORY_CARE_PROVIDER_SITE_OTHER)
Admission: RE | Admit: 2019-02-12 | Discharge: 2019-02-12 | Disposition: A | Discharge status: Home / Self Care | Payer: PPO | Source: Ambulatory Visit | Attending: Adult Health | Admitting: Adult Health

## 2019-02-12 ENCOUNTER — Other Ambulatory Visit: Payer: Self-pay

## 2019-02-12 DIAGNOSIS — Z0000 Encounter for general adult medical examination without abnormal findings: Secondary | ICD-10-CM

## 2019-02-12 DIAGNOSIS — E039 Hypothyroidism, unspecified: Secondary | ICD-10-CM

## 2019-02-12 DIAGNOSIS — I10 Essential (primary) hypertension: Secondary | ICD-10-CM

## 2019-02-12 DIAGNOSIS — E2839 Other primary ovarian failure: Secondary | ICD-10-CM

## 2019-02-25 ENCOUNTER — Other Ambulatory Visit (INDEPENDENT_AMBULATORY_CARE_PROVIDER_SITE_OTHER): Payer: PPO

## 2019-02-25 ENCOUNTER — Other Ambulatory Visit: Payer: Self-pay

## 2019-02-25 DIAGNOSIS — E039 Hypothyroidism, unspecified: Secondary | ICD-10-CM | POA: Diagnosis not present

## 2019-02-25 LAB — T3, FREE: T3, Free: 3.7 pg/mL (ref 2.3–4.2)

## 2019-02-25 LAB — TSH: TSH: 0.11 u[IU]/mL — ABNORMAL LOW (ref 0.35–4.50)

## 2019-02-25 LAB — T4, FREE: Free T4: 1.07 ng/dL (ref 0.60–1.60)

## 2019-02-26 ENCOUNTER — Encounter: Payer: Self-pay | Admitting: Adult Health

## 2019-02-26 ENCOUNTER — Other Ambulatory Visit: Payer: Self-pay | Admitting: Adult Health

## 2019-02-26 ENCOUNTER — Other Ambulatory Visit: Payer: Self-pay | Admitting: Family Medicine

## 2019-02-26 DIAGNOSIS — E039 Hypothyroidism, unspecified: Secondary | ICD-10-CM

## 2019-02-26 MED ORDER — SYNTHROID 88 MCG PO TABS: 88.0000 ug | ORAL_TABLET | Freq: Every day | ORAL | 0 refills | Status: DC

## 2019-02-28 ENCOUNTER — Other Ambulatory Visit: Payer: Self-pay | Admitting: Adult Health

## 2019-03-05 ENCOUNTER — Other Ambulatory Visit: Payer: Self-pay | Admitting: Adult Health

## 2019-03-05 DIAGNOSIS — E782 Mixed hyperlipidemia: Secondary | ICD-10-CM

## 2019-03-05 MED ORDER — SIMVASTATIN 10 MG PO TABS: 10.0000 mg | ORAL_TABLET | ORAL | 3 refills | Status: DC

## 2019-03-05 NOTE — Telephone Encounter (Signed)
DENIED.  FILLED ON 03/05/2019 FOR 1 YEAR.  MESSAGE SENT TO PLEASE GIVE 30 DAY SUPPLY UPON REQUEST.  NOTHING FURTHER NEEDED.

## 2019-04-11 ENCOUNTER — Other Ambulatory Visit: Payer: PPO

## 2019-04-11 ENCOUNTER — Other Ambulatory Visit: Payer: Self-pay

## 2019-04-11 ENCOUNTER — Other Ambulatory Visit (INDEPENDENT_AMBULATORY_CARE_PROVIDER_SITE_OTHER): Payer: PPO

## 2019-04-11 DIAGNOSIS — E039 Hypothyroidism, unspecified: Secondary | ICD-10-CM | POA: Diagnosis not present

## 2019-04-11 LAB — TSH: TSH: 0.31 u[IU]/mL — ABNORMAL LOW (ref 0.35–4.50)

## 2019-04-12 ENCOUNTER — Other Ambulatory Visit: Payer: Self-pay | Admitting: Adult Health

## 2019-04-12 MED ORDER — LEVOTHYROXINE SODIUM 75 MCG PO TABS: 75.0000 ug | ORAL_TABLET | Freq: Every day | ORAL | 3 refills | Status: DC

## 2019-04-15 ENCOUNTER — Ambulatory Visit (INDEPENDENT_AMBULATORY_CARE_PROVIDER_SITE_OTHER)
Admission: RE | Admit: 2019-04-15 | Discharge: 2019-04-15 | Disposition: A | Discharge status: Home / Self Care | Payer: PPO | Source: Ambulatory Visit | Attending: Adult Health | Admitting: Adult Health

## 2019-04-15 ENCOUNTER — Other Ambulatory Visit: Payer: Self-pay

## 2019-04-15 DIAGNOSIS — R918 Other nonspecific abnormal finding of lung field: Secondary | ICD-10-CM | POA: Diagnosis not present

## 2019-04-16 ENCOUNTER — Other Ambulatory Visit: Payer: Self-pay | Admitting: Adult Health

## 2019-04-16 DIAGNOSIS — R918 Other nonspecific abnormal finding of lung field: Secondary | ICD-10-CM

## 2019-04-17 ENCOUNTER — Telehealth: Payer: Self-pay | Admitting: Adult Health

## 2019-04-17 MED ORDER — AMPHETAMINE-DEXTROAMPHETAMINE 20 MG PO TABS: 20.0000 mg | ORAL_TABLET | Freq: Two times a day (BID) | ORAL | 0 refills | Status: DC

## 2019-04-17 NOTE — Telephone Encounter (Signed)
Medication Refill - Medication: amphetamine-dextroamphetamine (ADDERALL) 20 MG tablet   Has the patient contacted their pharmacy? Yes.   (Agent: If no, request that the patient contact the pharmacy for the refill.) (Agent: If yes, when and what did the pharmacy advise?)  Preferred Pharmacy (with phone number or street name): [facility], [place]: Please be advised that RX refills may take up to 3 business days. We ask that you follow-up with your pharmacy.

## 2019-05-08 DIAGNOSIS — H524 Presbyopia: Secondary | ICD-10-CM | POA: Diagnosis not present

## 2019-07-08 ENCOUNTER — Ambulatory Visit: Payer: Self-pay | Admitting: *Deleted

## 2019-07-08 NOTE — Telephone Encounter (Signed)
Patient scheduled for virtual appointment with PCP for 12/22 at [facility]

## 2019-07-08 NOTE — Telephone Encounter (Signed)
Patient is concerned about COVID - she has been having varying symptoms: chills, slight cough, fatigue, confusion- disorganization of thought. Call to office for appointment.  Reason for Disposition . [1] HIGH RISK patient (e.g., age > 56 years, diabetes, heart or lung disease, weak immune system) AND [2] new or worsening symptoms . [1] Acting confused (e.g., disoriented, slurred speech) AND [2] brief (now gone)  Answer Assessment - Initial Assessment Questions 1. COVID-19 DIAGNOSIS: "Who made your Coronavirus (COVID-19) diagnosis?" "Was it confirmed by a positive lab test?" If not diagnosed by a HCP, ask "Are there lots of cases (community spread) where you live?" (See public health department website, if unsure)     No exposure- community spread 2. COVID-19 EXPOSURE: "Was there any known exposure to COVID before the symptoms began?" CDC Definition of close contact: within 6 feet (2 meters) for a total of 15 minutes or more over a 24-hour period.      Sense of taste is off, dizziness/balance off, disorganized 3. ONSET: "When did the COVID-19 symptoms start?"      Several days ago 4. WORST SYMPTOM: "What is your worst symptom?" (e.g., cough, fever, shortness of breath, muscle aches)     Confusion-disorganization 5. COUGH: "Do you have a cough?" If so, ask: "How bad is the cough?"       Slight at first- not now 6. FEVER: "Do you have a fever?" If so, ask: "What is your temperature, how was it measured, and when did it start?"     No- 97.5 7. RESPIRATORY STATUS: "Describe your breathing?" (e.g., shortness of breath, wheezing, unable to speak)      Feel fatigued- takes deep breathes 8. BETTER-SAME-WORSE: "Are you getting better, staying the same or getting worse compared to yesterday?"  If getting worse, ask, "In what way?"    Same-a little dizziness, confusion 9. HIGH RISK DISEASE: "Do you have any chronic medical problems?" (e.g., asthma, heart or lung disease, weak immune system, obesity,  etc.)     no 10. PREGNANCY: "Is there any chance you are pregnant?" "When was your last menstrual period?"       n/a 11. OTHER SYMPTOMS: "Do you have any other symptoms?"  (e.g., chills, fatigue, headache, loss of smell or taste, muscle pain, sore throat; new loss of smell or taste especially support the diagnosis of COVID-19)       Chills last week  Answer Assessment - Initial Assessment Questions 1. LEVEL OF CONSCIOUSNESS: "How is he (she, the patient) acting right now?" (e.g., alert-oriented, confused, lethargic, stuporous, comatose)     Confused, anxiety, memory- disorganization 2. ONSET: "When did the confusion start?"  (minutes, hours, days)     Over the weekend 3. PATTERN "Does this come and go, or has it been constant since it started?"  "Is it present now?"     constant 4. ALCOHOL or DRUGS: "Has he been drinking alcohol or taking any drugs?"      no 5. NARCOTIC MEDICATIONS: "Has he been receiving any narcotic medications?" (e.g., morphine, Vicodin)     no 6. CAUSE: "What do you think is causing the confusion?"      Patient is concerned about COVID 7. OTHER SYMPTOMS: "Are there any other symptoms?" (e.g., difficulty breathing, headache, fever, weakness)     Feels like she needs deeper breaths- feels overwhelmed  Protocols used: CORONAVIRUS (COVID-19) DIAGNOSED OR SUSPECTED-A-AH, CONFUSION - DELIRIUM-A-AH

## 2019-07-09 ENCOUNTER — Telehealth (INDEPENDENT_AMBULATORY_CARE_PROVIDER_SITE_OTHER): Payer: PPO | Admitting: Adult Health

## 2019-07-09 ENCOUNTER — Other Ambulatory Visit: Payer: Self-pay

## 2019-07-09 DIAGNOSIS — Z20822 Contact with and (suspected) exposure to covid-19: Secondary | ICD-10-CM

## 2019-07-09 DIAGNOSIS — Z20828 Contact with and (suspected) exposure to other viral communicable diseases: Secondary | ICD-10-CM | POA: Diagnosis not present

## 2019-07-09 NOTE — Progress Notes (Signed)
Virtual Visit via Telephone Note  I connected with Gabrielle Vargas on 07/09/19 at  3:00 PM EST by telephone and verified that I am speaking with the correct person using two identifiers.   I discussed the limitations, risks, security and privacy concerns of performing an evaluation and management service by telephone and the availability of in person appointments. I also discussed with the patient that there may be a patient responsible charge related to this service. The patient expressed understanding and agreed to proceed.  Location patient: home Location provider: work or home office Participants present for the call: patient, provider Patient did not have a visit in the prior 7 days to address this/these issue(s).   History of Present Illness: 68 year old female who is being evaluated today for an acute issue.  Her symptoms started 5 days ago.  Her symptoms include chills, fatigue, mild headache, dizziness, and diminished taste and smell.  She denies fevers, chest pain, shortness of breath, sinus pain/pressure  or body aches.  Today she is feeling better than yesterday.  She has been staying hydrated, resting and taking Zinc and Vitamin D   Observations/Objective: Patient sounds cheerful and well on the phone. I do not appreciate any SOB. Speech and thought processing are grossly intact. Patient reported vitals:  Assessment and Plan: 1. Suspected COVID-19 virus infection -She was advised on testing for COVID-19.  Refuses at this time since she is feeling better.  She will self quarantine until December 27.  Unfortunately she has been around family members, she was advised to instruct them to be tested for COVID-19.  -Follow-up as needed   Follow Up Instructions:  I did not refer this patient for an OV in the next 24 hours for this/these issue(s).  I discussed the assessment and treatment plan with the patient. The patient was provided an opportunity to ask questions and all  were answered. The patient agreed with the plan and demonstrated an understanding of the instructions.   The patient was advised to call back or seek an in-person evaluation if the symptoms worsen or if the condition fails to improve as anticipated.  I provided 15 minutes of non-face-to-face time during this encounter.   [name], NP

## 2019-07-19 DIAGNOSIS — Z8616 Personal history of COVID-19: Secondary | ICD-10-CM

## 2019-07-19 HISTORY — DX: Personal history of COVID-19: Z86.16

## 2019-08-06 ENCOUNTER — Other Ambulatory Visit: Payer: Self-pay

## 2019-08-06 ENCOUNTER — Telehealth: Payer: Self-pay | Admitting: Adult Health

## 2019-08-06 NOTE — Telephone Encounter (Signed)
Pt will get a refill if appropriate at tomorrows appt.

## 2019-08-06 NOTE — Telephone Encounter (Signed)
Patient needs a refill for Adderall.  Patient is almost out of medication.   Pharmacy: [facility]

## 2019-08-07 ENCOUNTER — Ambulatory Visit (INDEPENDENT_AMBULATORY_CARE_PROVIDER_SITE_OTHER): Payer: PPO | Admitting: Adult Health

## 2019-08-07 ENCOUNTER — Encounter: Payer: Self-pay | Admitting: Adult Health

## 2019-08-07 VITALS — BP 140/82 | HR 104 | Temp 98.6°F | Ht 58.5 in | Wt 122.0 lb

## 2019-08-07 DIAGNOSIS — R4189 Other symptoms and signs involving cognitive functions and awareness: Secondary | ICD-10-CM | POA: Diagnosis not present

## 2019-08-07 NOTE — Progress Notes (Signed)
Subjective:    Patient ID: Gabrielle Vargas, female    DOB: [birth year], 69 y.o.   MRN: [id number]  HPI 69 year old female who  has a past medical history of Arthritis, Asthma, Diverticula, colon, Hyperlipidemia, Hypertension, and Hypothyroidism.  She presents with her husband today who helps provide history of her complaint.   She is presenting today for concern of possible cognitive issues.  She reports that she has felt "a little scattered" for some time now.  But that her family, husband and daughter have started to notice changes in her cognition.  Examples that she and her husband bring to my attention are the following  1) she and her husband feel as though she loses her train of thought.  She will stop middle of a sentence her story and cannot remember what she was talking about.  These items will come to her at a later time  2) she feels as though she will say things that are very confusing and she does not know why she is trying to explain them that way.  Per patient "it is like I try to explain things from the inside out".  3) her husband reports that she will often forget entire conversation, will repeat things multiple times or show him things on the phone and then a few minutes later show him the same thing.  4) she will intermittently forget how to do simple tasks, her husband's example is that they were in the kitchen cooking and she could not remember the correct way to make baked potatoes"  She does have a history of ADHD and has been on Adderall for some time now.  She is often scatterbrained and unable to concentrate on items at times.  She denies getting lost while driving, misplacing keys or her wallet or forgetting family members names.  She and her husband both endorse that she does feel a lot of stress when dealing with her daughter who lives out of town.  Patient feels like anything she says who her daughter is not right and her's daughter will "jump down my  throat and anything I say".  Her husband reports that this causes a lot of stress and anxiety with the patient.  Patient agrees to this.  Review of Systems See HPI   Past Medical History:  Diagnosis Date  . Arthritis    Osteo  . Asthma    Seasonal  . Diverticula, colon   . Hyperlipidemia   . Hypertension   . Hypothyroidism     Social History   Socioeconomic History  . Marital status: Married    Spouse name: Not on file  . Number of children: 2  . Years of education: Not on file  . Highest education level: Not on file  Occupational History  . Occupation: Retired  Tobacco Use  . Smoking status: Never Smoker  . Smokeless tobacco: Never Used  Substance and Sexual Activity  . Alcohol use: No  . Drug use: No  . Sexual activity: Not on file  Other Topics Concern  . Not on file  Social History Narrative   Retired   Married      Social Determinants of Radio broadcast assistant Strain:   . Difficulty of Paying Living Expenses: Not on file  Food Insecurity:   . Worried About Charity fundraiser in the Last Year: Not on file  . Ran Out of Food in the Last Year: Not on file  Transportation  Needs:   . Lack of Transportation (Medical): Not on file  . Lack of Transportation (Non-Medical): Not on file  Physical Activity:   . Days of Exercise per Week: Not on file  . Minutes of Exercise per Session: Not on file  Stress:   . Feeling of Stress : Not on file  Social Connections:   . Frequency of Communication with Friends and Family: Not on file  . Frequency of Social Gatherings with Friends and Family: Not on file  . Attends Religious Services: Not on file  . Active Member of Clubs or Organizations: Not on file  . Attends Archivist Meetings: Not on file  . Marital Status: Not on file  Intimate Partner Violence:   . Fear of Current or Ex-Partner: Not on file  . Emotionally Abused: Not on file  . Physically Abused: Not on file  . Sexually Abused: Not on file      Past Surgical History:  Procedure Laterality Date  . ABDOMINAL HYSTERECTOMY    . BREAST SURGERY    . CARPAL TUNNEL RELEASE Bilateral   . CERVICAL DISC ARTHROPLASTY    . CESAREAN SECTION     x2  . left shoulder surgery    . MENISCUS REPAIR Right 2017  . REDUCTION MAMMAPLASTY Bilateral   . right shoulder    . TONSILLECTOMY      Family History  Problem Relation Age of Onset  . Kidney disease Father   . Diabetes Father   . Heart disease Father   . Melanoma Mother   . Colon cancer Neg Hx   . Esophageal cancer Neg Hx   . Rectal cancer Neg Hx   . Stomach cancer Neg Hx     Allergies  Allergen Reactions  . Latex     REACTION: rash/hives  . Oxycodone-Acetaminophen     REACTION: nausea    Current Outpatient Medications on File Prior to Visit  Medication Sig Dispense Refill  . amphetamine-dextroamphetamine (ADDERALL) 20 MG tablet Take 1 tablet (20 mg total) by mouth 2 (two) times daily. 60 tablet 0  . amphetamine-dextroamphetamine (ADDERALL) 20 MG tablet Take 1 tablet (20 mg total) by mouth 2 (two) times daily. 60 tablet 0  . amphetamine-dextroamphetamine (ADDERALL) 20 MG tablet Take 1 tablet (20 mg total) by mouth 2 (two) times daily. 60 tablet 0  . hydrochlorothiazide (HYDRODIURIL) 25 MG tablet TAKE 1 TABLET(25 MG) BY MOUTH DAILY 90 tablet 3  . ibuprofen (ADVIL,MOTRIN) 200 MG tablet Take 200 mg by mouth every 6 (six) hours as needed.    Marland Kitchen levothyroxine (SYNTHROID) 75 MCG tablet Take 1 tablet (75 mcg total) by mouth daily. 90 tablet 3  . simvastatin (ZOCOR) 10 MG tablet Take 1 tablet (10 mg total) by mouth every other day. 45 tablet 3  . SYNTHROID 88 MCG tablet Take 1 tablet (88 mcg total) by mouth daily before breakfast. 60 tablet 0   No current facility-administered medications on file prior to visit.    BP 140/82   Pulse (!) 104   Temp 98.6 F (37 C) (Other (Comment))   Ht 4' 10.5" (1.486 m)   Wt 122 lb (55.3 kg)   SpO2 95%   BMI 25.06 kg/m       Objective:    Physical Exam Vitals and nursing note reviewed.  Constitutional:      Appearance: Normal appearance.  Neurological:     General: No focal deficit present.     Mental Status: She is alert and  oriented to person, place, and time.  Psychiatric:        Attention and Perception: Attention and perception normal.        Mood and Affect: Mood normal.        Speech: Speech normal.        Behavior: Behavior normal.        Thought Content: Thought content normal.        Cognition and Memory: Cognition and memory normal.        Judgment: Judgment normal.       Assessment & Plan:  1. Cognitive impairment -No deficits seen on exam today.  Her MMSE was 30/30.  Do not think that her cognitive impairment is due to dementia.  Will check TSH as we have been trying to get the right dose of Synthroid recently, check a B12 as well as CBC and sed rate.  Can consider low-dose antianxiety medication such as Celexa or Lexapro.  Also may need to refer to [place] attention specialist for her management of ADHD and she may needs a medication change.  Less likely to order MRI of the brain at this time. - TSH - Vitamin B12 - CBC with Differential/Platelet - Sedimentation Rate *Spent 40 minutes during this office visit reviewing prior labs, performing a Mini-Mental status exam, and reviewing further testing with the patient and her husband.  [name], NP

## 2019-08-07 NOTE — Patient Instructions (Signed)
Health Maintenance Due  Topic Date Due  . Hepatitis C Screening  [birth year]  . INFLUENZA VACCINE  02/16/2019    Depression screen [facility] 2/9 10/09/2017 09/19/2016 08/18/2015  Decreased Interest 0 0 0  Down, Depressed, Hopeless 0 0 0  PHQ - 2 Score 0 0 0

## 2019-08-08 ENCOUNTER — Other Ambulatory Visit: Payer: Self-pay

## 2019-08-08 ENCOUNTER — Other Ambulatory Visit: Payer: Self-pay | Admitting: Adult Health

## 2019-08-08 LAB — CBC WITH DIFFERENTIAL/PLATELET
Basophils Absolute: 0.1 10*3/uL (ref 0.0–0.1)
Basophils Relative: 1.2 % (ref 0.0–3.0)
Eosinophils Absolute: 0.1 10*3/uL (ref 0.0–0.7)
Eosinophils Relative: 1.3 % (ref 0.0–5.0)
HCT: 43 % (ref 36.0–46.0)
Hemoglobin: 14.6 g/dL (ref 12.0–15.0)
Lymphocytes Relative: 39.6 % (ref 12.0–46.0)
Lymphs Abs: 3 10*3/uL (ref 0.7–4.0)
MCHC: 34 g/dL (ref 30.0–36.0)
MCV: 86.6 fl (ref 78.0–100.0)
Monocytes Absolute: 0.4 10*3/uL (ref 0.1–1.0)
Monocytes Relative: 5.6 % (ref 3.0–12.0)
Neutro Abs: 4 10*3/uL (ref 1.4–7.7)
Neutrophils Relative %: 52.3 % (ref 43.0–77.0)
Platelets: 272 10*3/uL (ref 150.0–400.0)
RBC: 4.96 Mil/uL (ref 3.87–5.11)
RDW: 13.2 % (ref 11.5–15.5)
WBC: 7.6 10*3/uL (ref 4.0–10.5)

## 2019-08-08 LAB — SEDIMENTATION RATE: Sed Rate: 16 mm/hr (ref 0–30)

## 2019-08-08 LAB — TSH: TSH: 1.16 u[IU]/mL (ref 0.35–4.50)

## 2019-08-08 LAB — VITAMIN B12: Vitamin B-12: 184 pg/mL — ABNORMAL LOW (ref 211–911)

## 2019-08-08 MED ORDER — AMPHETAMINE-DEXTROAMPHETAMINE 20 MG PO TABS: 20.0000 mg | ORAL_TABLET | Freq: Two times a day (BID) | ORAL | 0 refills | Status: DC

## 2019-08-09 ENCOUNTER — Ambulatory Visit (INDEPENDENT_AMBULATORY_CARE_PROVIDER_SITE_OTHER): Payer: PPO

## 2019-08-09 DIAGNOSIS — E538 Deficiency of other specified B group vitamins: Secondary | ICD-10-CM | POA: Diagnosis not present

## 2019-08-09 MED ORDER — CYANOCOBALAMIN 1000 MCG/ML IJ SOLN
1000.0000 ug | Freq: Once | INTRAMUSCULAR | Status: AC
  Administered 2019-08-09: 11:00:00 1000 ug via INTRAMUSCULAR

## 2019-08-09 NOTE — Patient Instructions (Signed)
Health Maintenance Due  Topic Date Due  . Hepatitis C Screening  1951/02/06  . INFLUENZA VACCINE  02/16/2019    Depression screen [facility] 2/9 10/09/2017 09/19/2016 08/18/2015  Decreased Interest 0 0 0  Down, Depressed, Hopeless 0 0 0  PHQ - 2 Score 0 0 0

## 2019-08-09 NOTE — Progress Notes (Addendum)
Per orders of [name], NP, first  injection of B12 given in left deltoid by Franco Collet. Patient tolerated injection well.

## 2019-08-16 ENCOUNTER — Ambulatory Visit: Payer: PPO

## 2019-08-23 ENCOUNTER — Other Ambulatory Visit: Payer: Self-pay

## 2019-08-23 ENCOUNTER — Other Ambulatory Visit: Payer: Self-pay | Admitting: Adult Health

## 2019-08-23 ENCOUNTER — Ambulatory Visit (INDEPENDENT_AMBULATORY_CARE_PROVIDER_SITE_OTHER): Payer: PPO

## 2019-08-23 DIAGNOSIS — E538 Deficiency of other specified B group vitamins: Secondary | ICD-10-CM

## 2019-08-23 DIAGNOSIS — E782 Mixed hyperlipidemia: Secondary | ICD-10-CM

## 2019-08-23 MED ORDER — CYANOCOBALAMIN 1000 MCG/ML IJ SOLN
1000.0000 ug | Freq: Once | INTRAMUSCULAR | Status: AC
  Administered 2019-08-23: 1000 ug via INTRAMUSCULAR

## 2019-08-23 NOTE — Progress Notes (Signed)
Per orders of , injection of B12 given in Right deltoid by Franco Collet. This will be the last bi-weekly injection. Will return for monthly injections after.  Patient tolerated injection well.

## 2019-08-23 NOTE — Patient Instructions (Signed)
Health Maintenance Due  Topic Date Due  . Hepatitis C Screening  [birth year]  . INFLUENZA VACCINE  02/16/2019    Depression screen [facility] 2/9 10/09/2017 09/19/2016 08/18/2015  Decreased Interest 0 0 0  Down, Depressed, Hopeless 0 0 0  PHQ - 2 Score 0 0 0

## 2019-08-26 ENCOUNTER — Other Ambulatory Visit: Payer: Self-pay

## 2019-08-27 ENCOUNTER — Encounter: Payer: Self-pay | Admitting: Adult Health

## 2019-08-27 ENCOUNTER — Other Ambulatory Visit (INDEPENDENT_AMBULATORY_CARE_PROVIDER_SITE_OTHER): Payer: PPO

## 2019-08-27 DIAGNOSIS — E782 Mixed hyperlipidemia: Secondary | ICD-10-CM

## 2019-08-27 DIAGNOSIS — E538 Deficiency of other specified B group vitamins: Secondary | ICD-10-CM | POA: Diagnosis not present

## 2019-08-27 LAB — LIPID PANEL
Cholesterol: 207 mg/dL — ABNORMAL HIGH (ref 0–200)
HDL: 60 mg/dL (ref >39.00)
LDL Cholesterol: 113 mg/dL — ABNORMAL HIGH (ref 0–99)
NonHDL: 147.06
Total CHOL/HDL Ratio: 3
Triglycerides: 170 mg/dL — ABNORMAL HIGH (ref 0.0–149.0)
VLDL: 34 mg/dL (ref 0.0–40.0)

## 2019-08-27 LAB — VITAMIN B12: Vitamin B-12: 604 pg/mL (ref 211–911)

## 2019-08-28 MED ORDER — SIMVASTATIN 20 MG PO TABS: 20.0000 mg | ORAL_TABLET | ORAL | 3 refills | Status: DC

## 2019-08-28 NOTE — Telephone Encounter (Signed)
Updated patient on her labs, cholesterol panel continues to be elevated but has improved. Will increase simvastatin to 20 mg every other day

## 2019-08-30 NOTE — Telephone Encounter (Signed)
Noted  

## 2019-09-06 ENCOUNTER — Ambulatory Visit: Payer: PPO

## 2019-09-19 ENCOUNTER — Other Ambulatory Visit: Payer: Self-pay

## 2019-09-20 ENCOUNTER — Ambulatory Visit (INDEPENDENT_AMBULATORY_CARE_PROVIDER_SITE_OTHER): Payer: PPO

## 2019-09-20 DIAGNOSIS — E538 Deficiency of other specified B group vitamins: Secondary | ICD-10-CM | POA: Diagnosis not present

## 2019-09-20 MED ORDER — CYANOCOBALAMIN 1000 MCG/ML IJ SOLN
1000.0000 ug | Freq: Once | INTRAMUSCULAR | Status: AC
  Administered 2019-09-20: 1000 ug via INTRAMUSCULAR

## 2019-09-20 NOTE — Progress Notes (Signed)
Per orders of [employer], injection of B12 given in L deltoid by Franco Collet. Patient tolerated injection well.

## 2019-09-20 NOTE — Patient Instructions (Signed)
Health Maintenance Due  Topic Date Due  . Hepatitis C Screening  05-25-1951  . INFLUENZA VACCINE  02/16/2019    Depression screen [facility] 2/9 10/09/2017 09/19/2016 08/18/2015  Decreased Interest 0 0 0  Down, Depressed, Hopeless 0 0 0  PHQ - 2 Score 0 0 0

## 2019-09-26 ENCOUNTER — Telehealth: Payer: Self-pay | Admitting: Adult Health

## 2019-09-26 NOTE — Chronic Care Management (AMB) (Signed)
  Chronic Care Management   Note  09/26/2019 Name: Gabrielle Vargas MRN: [id number] DOB: [birth year]  Gabrielle Vargas is a 69 y.o. year old female who is a primary care patient of [name], NP. I reached out to [name] by phone today in response to a referral sent by Ms. DeWitt PCP, [name], NP.   Ms. Santarelli was given information about Chronic Care Management services today including:  1. CCM service includes personalized support from designated clinical staff supervised by her physician, including individualized plan of care and coordination with other care providers 2. 24/7 contact phone numbers for assistance for urgent and routine care needs. 3. Service will only be billed when office clinical staff spend 20 minutes or more in a month to coordinate care. 4. Only one practitioner may furnish and bill the service in a calendar month. 5. The patient may stop CCM services at any time (effective at the end of the month) by phone call to the office staff.   Patient agreed to services and verbal consent obtained.   Follow up plan:   Raynicia Dukes [employer] Scheduler

## 2019-10-08 ENCOUNTER — Telehealth: Payer: Self-pay

## 2019-10-08 NOTE — Telephone Encounter (Signed)
I am requesting an ambulatory referral to CCM be placed for this patient. Referral will need to have 2 current diagnosis attached to it. Thank you!  

## 2019-10-09 ENCOUNTER — Other Ambulatory Visit: Payer: Self-pay | Admitting: Adult Health

## 2019-10-09 DIAGNOSIS — E039 Hypothyroidism, unspecified: Secondary | ICD-10-CM

## 2019-10-09 DIAGNOSIS — I10 Essential (primary) hypertension: Secondary | ICD-10-CM

## 2019-10-11 ENCOUNTER — Ambulatory Visit: Payer: PPO

## 2019-10-11 ENCOUNTER — Other Ambulatory Visit: Payer: Self-pay

## 2019-10-11 DIAGNOSIS — F909 Attention-deficit hyperactivity disorder, unspecified type: Secondary | ICD-10-CM

## 2019-10-11 DIAGNOSIS — J4521 Mild intermittent asthma with (acute) exacerbation: Secondary | ICD-10-CM

## 2019-10-11 DIAGNOSIS — E538 Deficiency of other specified B group vitamins: Secondary | ICD-10-CM

## 2019-10-11 DIAGNOSIS — E782 Mixed hyperlipidemia: Secondary | ICD-10-CM

## 2019-10-11 DIAGNOSIS — E039 Hypothyroidism, unspecified: Secondary | ICD-10-CM

## 2019-10-11 DIAGNOSIS — I10 Essential (primary) hypertension: Secondary | ICD-10-CM

## 2019-10-11 NOTE — Patient Instructions (Addendum)
Visit Information  Goals Addressed            This Visit's Progress   . Pharmacy Care Plan       CARE PLAN ENTRY  Current Barriers:  . Chronic Disease Management support, education, and care coordination needs related to HTN, HLD, and hypothyroidism, ADHD, vitamin B12 deficiency  Pharmacist Clinical Goal(s):  Marland Kitchen Work with the care management team to address educational, disease management, and care coordination needs  . Call provider office for new or worsened signs and symptoms  . Over the next month, continue self health monitoring activities as directed today (blood pressure monitoring).  . Call care management team with questions or concerns.  . Blood pressure:  Marland Kitchen Maintain blood pressure within goal of your provider (130/80 or 140/90). . Last blood pressure readings: 140/82 (08/07/19); 124/80 (01/31/19) . Maintain low salt diet.  . High cholesterol:  . Cholesterol goals: Total Cholesterol goal under 200, Triglycerides goal under 150, HDL goal above 40 (men) or above 50 (women), LDL goal under 100.  . Current cholesterol levels (08/27/2019) - Total cholesterol: 207 - Triglycerides: 170 - HDL: 60 - LDL: 113 . Hypothyroidism:  . Maintain TSH between 0.45 to 4.5uIU/ml. . Current TSH (08/07/2019): 1.16 . ADHD . Minimize symptoms associated with ADHD.  Marland Kitchen Vitamin B12 deficiency . Maintain Vitamin B-12 level: 211-911.  . Vitamin B12 level: 604 (08/27/2019); 184 (08/07/2019)  . Lifestyle modifications . Continue working on lifestyle modifications (diet/exercise). . Continue staying active, engage in at least 150 minutes per week of moderate-intensity exercise such as brisk walking (15- to 20-minute mile) or something similar.   Interventions: . Comprehensive medication review performed. . Evaluation of current treatment plans and patient's adherence to plan as established by provider . Assessed patient understanding of disease states . Assessed patient's education and care  coordination needs . Provided disease specific education to patient . Blood pressure:  . Discussed need to continue checking blood pressure at home.  . Discussed diet modifications. DASH diet:  following a diet emphasizing fruits and vegetables and low-fat dairy products along with whole grains, fish, poultry, and nuts. Reducing red meats and sugars.  . Exercising.   . Reducing the amount of salt intake to 1500mg /per day.  . Recommend using a salt substitute to replace your salt if you need flavor.    . Continue: HCTZ 25mg , 1 tablet once daily . High cholesterol . How to reduce cholesterol through diet/weight management and physical activity.    . We discussed how a diet high in plant sterols (fruits/vegetables/nuts/whole grains/legumes) may reduce your cholesterol.  Encouraged increasing fiber to a daily intake of 10-25g/day  . Continue: simvastatin 20mg , 1 tablet every other day  . Hypothyroidism . Continue:Synthroid (levothyroxine) 79mcg, 1 tablet once daily . ADHD . Continue: amphetamine- dextroamphetamine (Adderall) 20mg , 1 tablet twice daily . Vitamin B12 deficiency . Continue: cyanocobalamin 1041mcg IM every month   Patient Self Care Activities:  . Self administers medications as prescribed and Calls provider office for new concerns or questions . Continue current medications as directed by providers.  . Continue following up with providers/ specialists. . Continue at home blood pressure readings. . Continue working on health habits (diet/ exercise).  Initial goal documentation        Gabrielle Vargas was given information about Chronic Care Management services today including:  1. CCM service includes personalized support from designated clinical staff supervised by her physician, including individualized plan of care and coordination with other care providers  2. 24/7 contact phone numbers for assistance for urgent and routine care needs. 3. Standard insurance, coinsurance,  copays and deductibles apply for chronic care management only during months in which we provide at least 20 minutes of these services. Most insurances cover these services at 100%, however patients may be responsible for any copay, coinsurance and/or deductible if applicable. This service may help you avoid the need for more expensive face-to-face services. 4. Only one practitioner may furnish and bill the service in a calendar month. 5. The patient may stop CCM services at any time (effective at the end of the month) by phone call to the office staff.  Patient agreed to services and verbal consent obtained.   The patient verbalized understanding of instructions provided today and agreed to receive a mailed copy of patient instruction and/or educational materials. Telephone follow up appointment with pharmacy team member scheduled for: 04/14/2020   Anson Crofts, PharmD Clinical Pharmacist [facility] [place] [phone]   [place] stands for "Dietary Approaches to Stop Hypertension." The DASH eating plan is a healthy eating plan that has been shown to reduce high blood pressure (hypertension). It may also reduce your risk for type 2 diabetes, heart disease, and stroke. The DASH eating plan may also help with weight loss. What are tips for following this plan?  General guidelines  Avoid eating more than 2,300 mg (milligrams) of salt (sodium) a day. If you have hypertension, you may need to reduce your sodium intake to 1,500 mg a day.  Limit alcohol intake to no more than 1 drink a day for nonpregnant women and 2 drinks a day for men. One drink equals 12 oz of beer, 5 oz of wine, or 1 oz of hard liquor.  Work with your health care provider to maintain a healthy body weight or to lose weight. Ask what an ideal weight is for you.  Get at least 30 minutes of exercise that causes your heart to beat faster (aerobic exercise) most days of the week. Activities  may include walking, swimming, or biking.  Work with your health care provider or diet and nutrition specialist (dietitian) to adjust your eating plan to your individual calorie needs. Reading food labels   Check food labels for the amount of sodium per serving. Choose foods with less than 5 percent of the Daily Value of sodium. Generally, foods with less than 300 mg of sodium per serving fit into this eating plan.  To find whole grains, look for the word "whole" as the first word in the ingredient list. Shopping  Buy products labeled as "low-sodium" or "no salt added."  Buy fresh foods. Avoid canned foods and premade or frozen meals. Cooking  Avoid adding salt when cooking. Use salt-free seasonings or herbs instead of table salt or sea salt. Check with your health care provider or pharmacist before using salt substitutes.  Do not fry foods. Cook foods using healthy methods such as baking, boiling, grilling, and broiling instead.  Cook with heart-healthy oils, such as olive, canola, soybean, or sunflower oil. Meal planning  Eat a balanced diet that includes: ? 5 or more servings of fruits and vegetables each day. At each meal, try to fill half of your plate with fruits and vegetables. ? Up to 6-8 servings of whole grains each day. ? Less than 6 oz of lean meat, poultry, or fish each day. A 3-oz serving of meat is about the same size as a deck of  cards. One egg equals 1 oz. ? 2 servings of low-fat dairy each day. ? A serving of nuts, seeds, or beans 5 times each week. ? Heart-healthy fats. Healthy fats called Omega-3 fatty acids are found in foods such as flaxseeds and coldwater fish, like sardines, salmon, and mackerel.  Limit how much you eat of the following: ? Canned or prepackaged foods. ? Food that is high in trans fat, such as fried foods. ? Food that is high in saturated fat, such as fatty meat. ? Sweets, desserts, sugary drinks, and other foods with added sugar. ? Full-fat  dairy products.  Do not salt foods before eating.  Try to eat at least 2 vegetarian meals each week.  Eat more home-cooked food and less restaurant, buffet, and fast food.  When eating at a restaurant, ask that your food be prepared with less salt or no salt, if possible. What foods are recommended? The items listed may not be a complete list. Talk with your dietitian about what dietary choices are best for you. Grains Whole-grain or whole-wheat bread. Whole-grain or whole-wheat pasta. Brown rice. [place]. Bulgur. Whole-grain and low-sodium cereals. Pita bread. Low-fat, low-sodium crackers. Whole-wheat flour tortillas. Vegetables Fresh or frozen vegetables (raw, steamed, roasted, or grilled). Low-sodium or reduced-sodium tomato and vegetable juice. Low-sodium or reduced-sodium tomato sauce and tomato paste. Low-sodium or reduced-sodium canned vegetables. Fruits All fresh, dried, or frozen fruit. Canned fruit in natural juice (without added sugar). Meat and other protein foods Skinless chicken or [place]. Ground chicken or [place]. Pork with fat trimmed off. Fish and seafood. Egg whites. Dried beans, peas, or lentils. Unsalted nuts, nut butters, and seeds. Unsalted canned beans. Lean cuts of beef with fat trimmed off. Low-sodium, lean deli meat. Dairy Low-fat (1%) or fat-free (skim) milk. Fat-free, low-fat, or reduced-fat cheeses. Nonfat, low-sodium ricotta or cottage cheese. Low-fat or nonfat yogurt. Low-fat, low-sodium cheese. Fats and oils Soft margarine without trans fats. Vegetable oil. Low-fat, reduced-fat, or light mayonnaise and salad dressings (reduced-sodium). Canola, safflower, olive, soybean, and sunflower oils. Avocado. Seasoning and other foods Herbs. Spices. Seasoning mixes without salt. Unsalted popcorn and pretzels. Fat-free sweets. What foods are not recommended? The items listed may not be a complete list. Talk with your dietitian about what dietary choices are best  for you. Grains Baked goods made with fat, such as croissants, muffins, or some breads. Dry pasta or rice meal packs. Vegetables Creamed or fried vegetables. Vegetables in a cheese sauce. Regular canned vegetables (not low-sodium or reduced-sodium). Regular canned tomato sauce and paste (not low-sodium or reduced-sodium). Regular tomato and vegetable juice (not low-sodium or reduced-sodium). Angie Fava. Olives. Fruits Canned fruit in a light or heavy syrup. Fried fruit. Fruit in cream or butter sauce. Meat and other protein foods Fatty cuts of meat. Ribs. Fried meat. Berniece Salines. Sausage. Bologna and other processed lunch meats. Salami. Fatback. Hotdogs. Bratwurst. Salted nuts and seeds. Canned beans with added salt. Canned or smoked fish. Whole eggs or egg yolks. Chicken or [place] with skin. Dairy Whole or 2% milk, cream, and half-and-half. Whole or full-fat cream cheese. Whole-fat or sweetened yogurt. Full-fat cheese. Nondairy creamers. Whipped toppings. Processed cheese and cheese spreads. Fats and oils Butter. Stick margarine. Lard. Shortening. Ghee. Bacon fat. Tropical oils, such as coconut, palm kernel, or palm oil. Seasoning and other foods Salted popcorn and pretzels. Onion salt, garlic salt, seasoned salt, table salt, and sea salt. Worcestershire sauce. Tartar sauce. Barbecue sauce. Teriyaki sauce. Soy sauce, including reduced-sodium. Steak sauce. Canned and packaged  gravies. Fish sauce. Oyster sauce. Cocktail sauce. Horseradish that you find on the shelf. Ketchup. Mustard. Meat flavorings and tenderizers. Bouillon cubes. Hot sauce and Tabasco sauce. Premade or packaged marinades. Premade or packaged taco seasonings. Relishes. Regular salad dressings. Where to find more information:  National Heart, Lung, and [place]: https://wilson-eaton.com/  American Heart Association: www.heart.org Summary  The DASH eating plan is a healthy eating plan that has been shown to reduce high blood pressure  (hypertension). It may also reduce your risk for type 2 diabetes, heart disease, and stroke.  With the DASH eating plan, you should limit salt (sodium) intake to 2,300 mg a day. If you have hypertension, you may need to reduce your sodium intake to 1,500 mg a day.  When on the DASH eating plan, aim to eat more fresh fruits and vegetables, whole grains, lean proteins, low-fat dairy, and heart-healthy fats.  Work with your health care provider or diet and nutrition specialist (dietitian) to adjust your eating plan to your individual calorie needs. This information is not intended to replace advice given to you by your health care provider. Make sure you discuss any questions you have with your health care provider. Document Revised: 06/16/2017 Document Reviewed: 06/27/2016 Elsevier Patient Education  2020 [employer].

## 2019-10-11 NOTE — Chronic Care Management (AMB) (Signed)
Chronic Care Management Pharmacy  Name: Gabrielle Vargas  MRN: [id number] DOB: April 03, 1951  Initial Questions: 1. Have you seen any other providers since your last visit? NA 2. Any changes in your medicines or health? No   Chief Complaint/ HPI Gabrielle Vargas,  69 y.o. , female presents for their Initial CCM visit with the clinical pharmacist via telephone due to COVID-19 Pandemic.  PCP : [name], NP  Their chronic conditions include: HTN,HLD, Hypothyroidism, ADHD, vitamin B-12 deficiency   Office Visits: 08/07/2019- Patient presented for office visit with [name], NP for chief complaint of cognitive issues. No deficits on exam. MMSE score: 30 out of 30. Labs ordered: TSH, B12, CBC, sed rate. Patient may need referral to [place] attention specialist for ADHD management.   07/09/2019- Patient presented for virtual visit with [name], NP. Symptoms reported: chills, fatigue, mild headache, dizziness, diminished taste and smell. Patient refused covid-19 test. Patient to self-quarantine until December 27.   Medications: Outpatient Encounter Medications as of 10/11/2019  Medication Sig  . amphetamine-dextroamphetamine (ADDERALL) 20 MG tablet Take 1 tablet (20 mg total) by mouth 2 (two) times daily.  . cyanocobalamin (,VITAMIN B-12,) 1000 MCG/ML injection Inject 1,000 mcg into the muscle every 30 (thirty) days.  . hydrochlorothiazide (HYDRODIURIL) 25 MG tablet TAKE 1 TABLET(25 MG) BY MOUTH DAILY  . ibuprofen (ADVIL,MOTRIN) 200 MG tablet Take 200 mg by mouth every 6 (six) hours as needed.  Marland Kitchen levothyroxine (SYNTHROID) 75 MCG tablet Take 1 tablet (75 mcg total) by mouth daily.  . simvastatin (ZOCOR) 20 MG tablet Take 1 tablet (20 mg total) by mouth every other day.  . amphetamine-dextroamphetamine (ADDERALL) 20 MG tablet Take 1 tablet (20 mg total) by mouth 2 (two) times daily.  Marland Kitchen amphetamine-dextroamphetamine (ADDERALL) 20 MG tablet Take 1 tablet (20 mg total) by mouth  2 (two) times daily.   No facility-administered encounter medications on file as of 10/11/2019.     Current Diagnosis/Assessment:  Goals Addressed            This Visit's Progress   . Pharmacy Care Plan       CARE PLAN ENTRY  Current Barriers:  . Chronic Disease Management support, education, and care coordination needs related to HTN, HLD, and hypothyroidism, ADHD, vitamin B12 deficiency  Pharmacist Clinical Goal(s):  Marland Kitchen Work with the care management team to address educational, disease management, and care coordination needs  . Call provider office for new or worsened signs and symptoms  . Over the next month, continue self health monitoring activities as directed today (blood pressure monitoring).  . Call care management team with questions or concerns.  . Blood pressure:  Marland Kitchen Maintain blood pressure within goal of your provider (130/80 or 140/90). . Last blood pressure readings: 140/82 (08/07/19); 124/80 (01/31/19) . Maintain low salt diet.  . High cholesterol:  . Cholesterol goals: Total Cholesterol goal under 200, Triglycerides goal under 150, HDL goal above 40 (men) or above 50 (women), LDL goal under 100.  . Current cholesterol levels (08/27/2019) - Total cholesterol: 207 - Triglycerides: 170 - HDL: 60 - LDL: 113 . Hypothyroidism:  . Maintain TSH between 0.45 to 4.5uIU/ml. . Current TSH (08/07/2019): 1.16 . ADHD . Minimize symptoms associated with ADHD.  Marland Kitchen Vitamin B12 deficiency . Maintain Vitamin B-12 level: 211-911.  . Vitamin B12 level: 604 (08/27/2019); 184 (08/07/2019)  . Lifestyle modifications . Continue working on lifestyle modifications (diet/exercise). . Continue staying active, engage in at least 150 minutes per week of moderate-intensity  exercise such as brisk walking (15- to 20-minute mile) or something similar.   Interventions: . Comprehensive medication review performed. . Evaluation of current treatment plans and patient's adherence to plan as  established by provider . Assessed patient understanding of disease states . Assessed patient's education and care coordination needs . Provided disease specific education to patient . Blood pressure:  . Discussed need to continue checking blood pressure at home.  . Discussed diet modifications. DASH diet:  following a diet emphasizing fruits and vegetables and low-fat dairy products along with whole grains, fish, poultry, and nuts. Reducing red meats and sugars.  . Exercising.   . Reducing the amount of salt intake to 1500mg /per day.  . Recommend using a salt substitute to replace your salt if you need flavor.    . Continue: HCTZ 25mg , 1 tablet once daily . High cholesterol . How to reduce cholesterol through diet/weight management and physical activity.    . We discussed how a diet high in plant sterols (fruits/vegetables/nuts/whole grains/legumes) may reduce your cholesterol.  Encouraged increasing fiber to a daily intake of 10-25g/day  . Continue: simvastatin 20mg , 1 tablet every other day  . Hypothyroidism . Continue:Synthroid (levothyroxine) 68mcg, 1 tablet once daily . ADHD . Continue: amphetamine- dextroamphetamine (Adderall) 20mg , 1 tablet twice daily . Vitamin B12 deficiency . Continue: cyanocobalamin 1077mcg IM every month   Patient Self Care Activities:  . Self administers medications as prescribed and Calls provider office for new concerns or questions . Continue current medications as directed by providers.  . Continue following up with providers/ specialists. . Continue at home blood pressure readings. . Continue working on health habits (diet/ exercise).  Initial goal documentation        Hypertension   Office blood pressures are  BP Readings from Last 3 Encounters:  08/07/19 140/82  01/31/19 124/80  09/18/18 136/84   Patient has failed these meds in the past: none   Patient checks BP at home infrequently  Patient home BP readings are ranging: no  reported BP readings   Patient is controlled on:  - HCTZ 25mg , 1 tablet once daily (patient takes in the AM)   We discussed diet and exercise extensively . DASH diet:  following a diet emphasizing fruits and vegetables and low-fat dairy products along with whole grains, fish, poultry, and nuts. Reducing red meats and sugars.  . Patient states their meals consist of mostly vegetables (collard greens), fruit.   Marland Kitchen Exercising . Patient notes they both walk; 1 to 2 hours every day depending on weather.  . Limiting the amount of alcohol to 1 or 2 drinks per day.   . Denies alcohol intake.  . Reducing the amount of salt intake to 1500mg /per day.  . Recommend using a salt substitute to replace your salt if you need flavor.     Plan Denies dizziness/ lightheaded.  Continue current medications and control with diet and exercise.    Hyperlipidemia   Lipid Panel     Component Value Date/Time   CHOL 207 (H) 08/27/2019 1025   TRIG 170.0 (H) 08/27/2019 1025   HDL 60.00 08/27/2019 1025   CHOLHDL 3 08/27/2019 1025   VLDL 34.0 08/27/2019 1025   LDLCALC 113 (H) 08/27/2019 1025   LDLDIRECT 158.7 06/19/2014 0823    The 10-year ASCVD risk score Mikey Bussing DC Jr., et al., 2013) is: 16.4%   Values used to calculate the score:     Age: 59 years     Sex: Female  Is Non-Hispanic African American: No     Diabetic: No     Tobacco smoker: No     Systolic Blood Pressure: [id number] mmHg     Is BP treated: Yes     HDL Cholesterol: 60 mg/dL     Total Cholesterol: 207 mg/dL   Patient has failed these meds in past:  Patient is currently controlled on the following medications:  -simvastatin 20mg , 1 tablet every other day   We discussed:  diet and exercise extensively  .  How to reduce cholesterol through diet/weight management and physical activity.  . We discussed how a diet high in plant sterols (fruits/vegetables/nuts/whole grains/legumes) may reduce your cholesterol.  Encouraged increasing fiber to a  daily intake of 10-25g/day   Plan Simvastatin dose increased to 20mg  every other day on 08/28/2019.  Patient notes no side effects with increased dose.  Continue current medications and control with diet and exercise.    Hypothyroidism   TSH  Date Value Ref Range Status  08/07/2019 1.16 0.35 - 4.50 uIU/mL Final    Patient has failed these meds in past: none  Patient is currently controlled on the following medications:  - Synthroid (levothyroxine 61mcg, 1 tablet once daily  (patient reports can only take Brand name)  We discussed:  consistent administration (patient reports taking at least 30 minutes before breakfast).   Plan Continue current medications  ADHD   Patient is currently controlled on the following medications:  - amphetamine- dextroamphetamine (Adderall) 20mg , 1 tablet twice daily  Plan Patient requested refill.  Patient reported may skip doses during the weekend when she does not need to focus as much.  Continue current medications.   Vitamin B-12 deficiency   Patient is currently managed on the following medications:  - cyanocobalamin 1042mcg IM every month   Vitamin B12:  604 (08/27/2019)  184 (08/07/2019)   Plan Recommend vitamin B12 monitoring every 3 to 6 months.  Continue current medications  Occasional pain    Patient has failed these meds in past: none  Patient is currently controlled on the following medications:  - ibuprofen 200mg , 1 tablet every six hours as needed (patient notes taking after long walks. She states she does not take very often).   Plan Continue current medications  Medication Management  Patient organizes medications: patient uses a pill box  Adherence: no gaps in refill history (per medication dispense report 04/14/19 to 10/11/19).    Follow up Follow up visit with PharmD in 6 months. Will conduct general telephone calls for periodic check-ins before next visit.   Anson Crofts, PharmD Clinical  Pharmacist [facility] [phone]

## 2019-10-16 ENCOUNTER — Other Ambulatory Visit: Payer: Self-pay | Admitting: Adult Health

## 2019-10-16 ENCOUNTER — Other Ambulatory Visit: Payer: Self-pay

## 2019-10-17 ENCOUNTER — Ambulatory Visit (INDEPENDENT_AMBULATORY_CARE_PROVIDER_SITE_OTHER): Payer: PPO

## 2019-10-17 ENCOUNTER — Telehealth: Payer: Self-pay

## 2019-10-17 DIAGNOSIS — E538 Deficiency of other specified B group vitamins: Secondary | ICD-10-CM | POA: Diagnosis not present

## 2019-10-17 MED ORDER — CYANOCOBALAMIN 1000 MCG/ML IJ SOLN
1000.0000 ug | Freq: Once | INTRAMUSCULAR | Status: AC
  Administered 2019-10-17: 1000 ug via INTRAMUSCULAR

## 2019-10-17 NOTE — Telephone Encounter (Signed)
Patient requested refill for amphetamine-dextroamphetamine (ADDERALL) 20 MG tablet sent to [facility].   Upon chart review,  Adderall refills for 30 days x 3 months were sent to [facility] on 08/08/2019.   Called [facility] and confirmed this. They have Adderall rx on hold and can be filled.  Called patient to relay above information. Patient understood and will call [facility] to get refill.

## 2019-10-17 NOTE — Progress Notes (Signed)
Per orders of [name], NP patient was given a vitamin B12 injection in her right deltoid. Patient tolerated injection well.

## 2019-11-15 ENCOUNTER — Other Ambulatory Visit: Payer: Self-pay

## 2019-11-18 ENCOUNTER — Ambulatory Visit (INDEPENDENT_AMBULATORY_CARE_PROVIDER_SITE_OTHER): Payer: PPO

## 2019-11-18 ENCOUNTER — Other Ambulatory Visit: Payer: Self-pay

## 2019-11-18 DIAGNOSIS — E538 Deficiency of other specified B group vitamins: Secondary | ICD-10-CM

## 2019-11-18 MED ORDER — CYANOCOBALAMIN 1000 MCG/ML IJ SOLN
1000.0000 ug | Freq: Once | INTRAMUSCULAR | Status: AC
  Administered 2019-11-18: 1000 ug via INTRAMUSCULAR

## 2019-11-18 NOTE — Progress Notes (Signed)
Per orders of [name], NP patient was given a vitamin B12 injection in her left deltoid. Patient tolerated injection well. Patient will schedule monthly B12 injections now.

## 2019-12-19 ENCOUNTER — Other Ambulatory Visit: Payer: Self-pay | Admitting: Adult Health

## 2019-12-19 ENCOUNTER — Telehealth: Payer: Self-pay

## 2019-12-19 ENCOUNTER — Ambulatory Visit (INDEPENDENT_AMBULATORY_CARE_PROVIDER_SITE_OTHER): Payer: PPO

## 2019-12-19 ENCOUNTER — Other Ambulatory Visit: Payer: Self-pay

## 2019-12-19 DIAGNOSIS — E538 Deficiency of other specified B group vitamins: Secondary | ICD-10-CM

## 2019-12-19 MED ORDER — AMPHETAMINE-DEXTROAMPHETAMINE 20 MG PO TABS: 20.0000 mg | ORAL_TABLET | Freq: Two times a day (BID) | ORAL | 0 refills | Status: DC

## 2019-12-19 MED ORDER — CYANOCOBALAMIN 1000 MCG/ML IJ SOLN
1000.0000 ug | Freq: Once | INTRAMUSCULAR | Status: AC
  Administered 2019-12-19: 1000 ug via INTRAMUSCULAR

## 2019-12-19 NOTE — Progress Notes (Signed)
Per orders of [name], NP, patient was given vitamin B12 injection in her right deltoid. Patient tolerated injection well.

## 2019-12-19 NOTE — Telephone Encounter (Signed)
Patient came in today for her B12 injection and requested a refill of her Adderall and stated that she is out. Patient requested that I send a message.  Last OV 08/07/2019, has scheduled her CPE for 02/05/2020  Last filled 08/08/2019  # 32 with 2 refills

## 2019-12-31 ENCOUNTER — Other Ambulatory Visit: Payer: Self-pay | Admitting: Adult Health

## 2020-01-02 NOTE — Telephone Encounter (Signed)
Sent to the pharmacy by e-scribe for 90 days.  Pt has upcoming cpx in July.

## 2020-01-16 ENCOUNTER — Ambulatory Visit (INDEPENDENT_AMBULATORY_CARE_PROVIDER_SITE_OTHER): Payer: PPO | Admitting: *Deleted

## 2020-01-16 ENCOUNTER — Other Ambulatory Visit: Payer: Self-pay

## 2020-01-16 DIAGNOSIS — E538 Deficiency of other specified B group vitamins: Secondary | ICD-10-CM | POA: Diagnosis not present

## 2020-01-16 MED ORDER — CYANOCOBALAMIN 1000 MCG/ML IJ SOLN
1000.0000 ug | Freq: Once | INTRAMUSCULAR | Status: AC
  Administered 2020-01-16: 1000 ug via INTRAMUSCULAR

## 2020-01-16 NOTE — Progress Notes (Signed)
Per orders of [name], NP, injection of monthly B 12 given by Zacarias Pontes. Patient tolerated injection well.

## 2020-02-05 ENCOUNTER — Encounter: Payer: Self-pay | Admitting: Adult Health

## 2020-02-05 ENCOUNTER — Other Ambulatory Visit: Payer: Self-pay

## 2020-02-05 ENCOUNTER — Ambulatory Visit (INDEPENDENT_AMBULATORY_CARE_PROVIDER_SITE_OTHER): Payer: PPO | Admitting: Adult Health

## 2020-02-05 VITALS — BP 140/86 | Temp 98.3°F | Ht 59.0 in | Wt 117.0 lb

## 2020-02-05 DIAGNOSIS — I10 Essential (primary) hypertension: Secondary | ICD-10-CM

## 2020-02-05 DIAGNOSIS — E538 Deficiency of other specified B group vitamins: Secondary | ICD-10-CM

## 2020-02-05 DIAGNOSIS — Z0000 Encounter for general adult medical examination without abnormal findings: Secondary | ICD-10-CM | POA: Diagnosis not present

## 2020-02-05 DIAGNOSIS — E039 Hypothyroidism, unspecified: Secondary | ICD-10-CM

## 2020-02-05 DIAGNOSIS — Z1159 Encounter for screening for other viral diseases: Secondary | ICD-10-CM

## 2020-02-05 DIAGNOSIS — E782 Mixed hyperlipidemia: Secondary | ICD-10-CM

## 2020-02-05 DIAGNOSIS — F909 Attention-deficit hyperactivity disorder, unspecified type: Secondary | ICD-10-CM

## 2020-02-05 NOTE — Progress Notes (Signed)
Subjective:    Patient ID: Gabrielle Vargas, female    DOB: 1950-10-10, 69 y.o.   MRN: [id number]  HPI  Patient presents for yearly preventative medicine examination. She is a pleasant 69 year old female who  has a past medical history of Arthritis, Asthma, Diverticula, colon, Hyperlipidemia, Hypertension, and Hypothyroidism.  Hypertension -currently prescribed HCTZ 25 mg daily.  She denies dizziness, lightheadedness, chest pain, or shortness of breath.  BP Readings from Last 3 Encounters:  02/05/20 140/86  08/07/19 140/82  01/31/19 124/80   Hyperlipidemia-currently prescribed simvastatin 20 mg every other day.  She does not note any side effects with increased dose. Lab Results  Component Value Date   CHOL 207 (H) 08/27/2019   HDL 60.00 08/27/2019   LDLCALC 113 (H) 08/27/2019   LDLDIRECT 158.7 06/19/2014   TRIG 170.0 (H) 08/27/2019   CHOLHDL 3 08/27/2019   Hypothyroidism -currently prescribed Synthroid 75 mcg daily.  She feels well controlled on this medication  ADHD-takes Adderall 20 mg twice daily.  On the weekends when she does not need to focus as much she may skip a dose.  Vitamin B12 deficiency-takes B12 1000 mcg IM every month Lab Results  Component Value Date   [id number] 122 08/27/2019   All immunizations and health maintenance protocols were reviewed with the patient and needed orders were placed. Refuses Covid Vaccination at this time.   Appropriate screening laboratory values were ordered for the patient including screening of hyperlipidemia, renal function and hepatic function.  Medication reconciliation,  past medical history, social history, problem list and allergies were reviewed in detail with the patient  Goals were established with regard to weight loss, exercise, and  diet in compliance with medications  She is up-to-date on routine colon cancer screening, mammogram, and bone density.  No acute complaints.    Review of Systems   Constitutional: Negative.   HENT: Negative.   Eyes: Negative.   Respiratory: Negative.   Cardiovascular: Negative.   Gastrointestinal: Negative.   Endocrine: Negative.   Genitourinary: Negative.   Musculoskeletal: Negative.   Skin: Negative.   Allergic/Immunologic: Negative.   Neurological: Negative.   Hematological: Negative.   Psychiatric/Behavioral: Negative.    Past Medical History:  Diagnosis Date  . Arthritis    Osteo  . Asthma    Seasonal  . Diverticula, colon   . Hyperlipidemia   . Hypertension   . Hypothyroidism     Social History   Socioeconomic History  . Marital status: Married    Spouse name: Not on file  . Number of children: 2  . Years of education: Not on file  . Highest education level: Not on file  Occupational History  . Occupation: Retired  Tobacco Use  . Smoking status: Never Smoker  . Smokeless tobacco: Never Used  Substance and Sexual Activity  . Alcohol use: No  . Drug use: No  . Sexual activity: Not on file  Other Topics Concern  . Not on file  Social History Narrative   Retired   Married      Social Determinants of Radio broadcast assistant Strain:   . Difficulty of Paying Living Expenses:   Food Insecurity:   . Worried About Charity fundraiser in the Last Year:   . Arboriculturist in the Last Year:   Transportation Needs:   . Film/video editor (Medical):   Marland Kitchen Lack of Transportation (Non-Medical):   Physical Activity:   . Days of Exercise per  Week:   . Minutes of Exercise per Session:   Stress:   . Feeling of Stress :   Social Connections:   . Frequency of Communication with Friends and Family:   . Frequency of Social Gatherings with Friends and Family:   . Attends Religious Services:   . Active Member of Clubs or Organizations:   . Attends Archivist Meetings:   Marland Kitchen Marital Status:   Intimate Partner Violence:   . Fear of Current or Ex-Partner:   . Emotionally Abused:   Marland Kitchen Physically Abused:   .  Sexually Abused:     Past Surgical History:  Procedure Laterality Date  . ABDOMINAL HYSTERECTOMY    . BREAST SURGERY    . CARPAL TUNNEL RELEASE Bilateral   . CERVICAL DISC ARTHROPLASTY    . CESAREAN SECTION     x2  . left shoulder surgery    . MENISCUS REPAIR Right 2017  . REDUCTION MAMMAPLASTY Bilateral   . right shoulder    . TONSILLECTOMY      Family History  Problem Relation Age of Onset  . Kidney disease Father   . Diabetes Father   . Heart disease Father   . Melanoma Mother   . Colon cancer Neg Hx   . Esophageal cancer Neg Hx   . Rectal cancer Neg Hx   . Stomach cancer Neg Hx     Allergies  Allergen Reactions  . Latex     REACTION: rash/hives  . Oxycodone-Acetaminophen     REACTION: nausea    Current Outpatient Medications on File Prior to Visit  Medication Sig Dispense Refill  . amphetamine-dextroamphetamine (ADDERALL) 20 MG tablet Take 1 tablet (20 mg total) by mouth 2 (two) times daily. 60 tablet 0  . amphetamine-dextroamphetamine (ADDERALL) 20 MG tablet Take 1 tablet (20 mg total) by mouth 2 (two) times daily. 60 tablet 0  . amphetamine-dextroamphetamine (ADDERALL) 20 MG tablet Take 1 tablet (20 mg total) by mouth 2 (two) times daily. 60 tablet 0  . cyanocobalamin (,VITAMIN B-12,) 1000 MCG/ML injection Inject 1,000 mcg into the muscle every 30 (thirty) days.    . hydrochlorothiazide (HYDRODIURIL) 25 MG tablet TAKE 1 TABLET(25 MG) BY MOUTH DAILY 90 tablet 0  . ibuprofen (ADVIL,MOTRIN) 200 MG tablet Take 200 mg by mouth every 6 (six) hours as needed.    Marland Kitchen levothyroxine (SYNTHROID) 75 MCG tablet Take 1 tablet (75 mcg total) by mouth daily. 90 tablet 3  . simvastatin (ZOCOR) 20 MG tablet Take 1 tablet (20 mg total) by mouth every other day. 45 tablet 3   No current facility-administered medications on file prior to visit.    BP 140/86   Temp 98.3 F (36.8 C)   Ht '4\' 11"'  (1.499 m)   Wt 117 lb (53.1 kg)   BMI 23.63 kg/m       Objective:   Physical  Exam Vitals and nursing note reviewed.  Constitutional:      General: She is not in acute distress.    Appearance: Normal appearance. She is well-developed. She is not ill-appearing.  HENT:     Head: Normocephalic and atraumatic.     Right Ear: Tympanic membrane, ear canal and external ear normal. There is no impacted cerumen.     Left Ear: Tympanic membrane, ear canal and external ear normal. There is no impacted cerumen.     Nose: Nose normal. No congestion or rhinorrhea.     Mouth/Throat:     Mouth: Mucous membranes are  moist.     Pharynx: Oropharynx is clear. No oropharyngeal exudate or posterior oropharyngeal erythema.  Eyes:     General:        Right eye: No discharge.        Left eye: No discharge.     Extraocular Movements: Extraocular movements intact.     Conjunctiva/sclera: Conjunctivae normal.     Pupils: Pupils are equal, round, and reactive to light.  Neck:     Thyroid: No thyromegaly.     Vascular: No carotid bruit.     Trachea: No tracheal deviation.  Cardiovascular:     Rate and Rhythm: Normal rate and regular rhythm.     Pulses: Normal pulses.     Heart sounds: Normal heart sounds. No murmur heard.  No friction rub. No gallop.   Pulmonary:     Effort: Pulmonary effort is normal. No respiratory distress.     Breath sounds: Normal breath sounds. No stridor. No wheezing, rhonchi or rales.  Chest:     Chest wall: No tenderness.  Abdominal:     General: Abdomen is flat. Bowel sounds are normal. There is no distension.     Palpations: Abdomen is soft. There is no mass.     Tenderness: There is no abdominal tenderness. There is no right CVA tenderness, left CVA tenderness, guarding or rebound.     Hernia: No hernia is present.  Musculoskeletal:        General: No swelling, tenderness, deformity or signs of injury. Normal range of motion.     Cervical back: Normal range of motion and neck supple.     Right lower leg: No edema.     Left lower leg: No edema.    Lymphadenopathy:     Cervical: No cervical adenopathy.  Skin:    General: Skin is warm and dry.     Coloration: Skin is not jaundiced or pale.     Findings: No bruising, erythema, lesion or rash.  Neurological:     General: No focal deficit present.     Mental Status: She is alert and oriented to person, place, and time.     Cranial Nerves: No cranial nerve deficit.     Sensory: No sensory deficit.     Motor: No weakness.     Coordination: Coordination normal.     Gait: Gait normal.     Deep Tendon Reflexes: Reflexes normal.  Psychiatric:        Mood and Affect: Mood normal.        Behavior: Behavior normal.        Thought Content: Thought content normal.        Judgment: Judgment normal.       Assessment & Plan:  1. Routine general medical examination at a health care facility - Continue to stay active and eat healthy  - Follow up in one year or sooner if needed  - CBC with Differential/Platelet; Future - Lipid panel; Future - TSH; Future - CMP with eGFR([facility]); Future  2. B12 deficiency  - Vitamin B12; Future  3. Essential hypertension - No change in medications  - CBC with Differential/Platelet; Future - Lipid panel; Future - TSH; Future - CMP with eGFR([facility]); Future  4. Mixed hyperlipidemia - Consider dose change of statin  - CBC with Differential/Platelet; Future - Lipid panel; Future - TSH; Future - CMP with eGFR([facility]); Future  5. Acquired hypothyroidism - Consider dose change  - CBC with Differential/Platelet; Future - Lipid panel; Future - TSH;  Future - CMP with eGFR([facility]); Future - Hep C Antibody; Future  6. Attention deficit hyperactivity disorder (ADHD), unspecified ADHD type - Continue with Adderall   7. Need for hepatitis C screening test  - Hep C Antibody; Future  [name], NP

## 2020-02-06 LAB — CBC WITH DIFFERENTIAL/PLATELET
Absolute Monocytes: 407 cells/uL (ref 200–950)
Basophils Absolute: 58 cells/uL (ref 0–200)
Basophils Relative: 0.7 %
Eosinophils Absolute: 58 cells/uL (ref 15–500)
Eosinophils Relative: 0.7 %
HCT: 44.8 % (ref 35.0–45.0)
Hemoglobin: 14.9 g/dL (ref 11.7–15.5)
Lymphs Abs: 1685 cells/uL (ref 850–3900)
MCH: 29.7 pg (ref 27.0–33.0)
MCHC: 33.3 g/dL (ref 32.0–36.0)
MCV: 89.4 fL (ref 80.0–100.0)
MPV: 9 fL (ref 7.5–12.5)
Monocytes Relative: 4.9 %
Neutro Abs: 6092 cells/uL (ref 1500–7800)
Neutrophils Relative %: 73.4 %
Platelets: 238 10*3/uL (ref 140–400)
RBC: 5.01 10*6/uL (ref 3.80–5.10)
RDW: 12.8 % (ref 11.0–15.0)
Total Lymphocyte: 20.3 %
WBC: 8.3 10*3/uL (ref 3.8–10.8)

## 2020-02-06 LAB — COMPLETE METABOLIC PANEL WITH GFR
AG Ratio: 2.1 (calc) (ref 1.0–2.5)
ALT: 16 U/L (ref 6–29)
AST: 17 U/L (ref 10–35)
Albumin: 4.7 g/dL (ref 3.6–5.1)
Alkaline phosphatase (APISO): 99 U/L (ref 37–153)
BUN: 16 mg/dL (ref 7–25)
CO2: 29 mmol/L (ref 20–32)
Calcium: 10 mg/dL (ref 8.6–10.4)
Chloride: 99 mmol/L (ref 98–110)
Creat: 0.8 mg/dL (ref 0.60–0.93)
GFR, Est African American: 86 mL/min/{1.73_m2} (ref >=60)
GFR, Est Non African American: 74 mL/min/{1.73_m2} (ref >=60)
Globulin: 2.2 g/dL (calc) (ref 1.9–3.7)
Glucose, Bld: 102 mg/dL — ABNORMAL HIGH (ref 65–99)
Potassium: 3.8 mmol/L (ref 3.5–5.3)
Sodium: 139 mmol/L (ref 135–146)
Total Bilirubin: 0.5 mg/dL (ref 0.2–1.2)
Total Protein: 6.9 g/dL (ref 6.1–8.1)

## 2020-02-06 LAB — LIPID PANEL
Cholesterol: 204 mg/dL — ABNORMAL HIGH (ref <200)
HDL: 60 mg/dL (ref >=50)
LDL Cholesterol (Calc): 110 mg/dL (calc) — ABNORMAL HIGH
Non-HDL Cholesterol (Calc): 144 mg/dL (calc) — ABNORMAL HIGH (ref <130)
Total CHOL/HDL Ratio: 3.4 (calc) (ref <5.0)
Triglycerides: 216 mg/dL — ABNORMAL HIGH (ref <150)

## 2020-02-06 LAB — HEPATITIS C ANTIBODY
Hepatitis C Ab: NONREACTIVE
SIGNAL TO CUT-OFF: 0 (ref <1.00)

## 2020-02-06 LAB — VITAMIN B12: Vitamin B-12: 424 pg/mL (ref 200–1100)

## 2020-02-06 LAB — TSH: TSH: 1.6 mIU/L (ref 0.40–4.50)

## 2020-02-13 ENCOUNTER — Other Ambulatory Visit: Payer: Self-pay

## 2020-02-13 ENCOUNTER — Ambulatory Visit (INDEPENDENT_AMBULATORY_CARE_PROVIDER_SITE_OTHER): Payer: PPO

## 2020-02-13 DIAGNOSIS — E538 Deficiency of other specified B group vitamins: Secondary | ICD-10-CM | POA: Diagnosis not present

## 2020-02-13 MED ORDER — CYANOCOBALAMIN 1000 MCG/ML IJ SOLN
1000.0000 ug | Freq: Once | INTRAMUSCULAR | Status: AC
  Administered 2020-02-13: 1000 ug via INTRAMUSCULAR

## 2020-02-13 NOTE — Progress Notes (Signed)
Per orders of Nafziger, Tommi Rumps, NP, injection of B12 given in Right deltoid by Franco Collet. Patient tolerated injection well.  Lab Results  Component Value Date   [id number] 417 02/05/2020

## 2020-02-13 NOTE — Patient Instructions (Signed)
There are no preventive care reminders to display for this patient.  Depression screen [facility] 2/9 02/05/2020 10/09/2017 09/19/2016  Decreased Interest 0 0 0  Down, Depressed, Hopeless 0 0 0  PHQ - 2 Score 0 0 0

## 2020-03-12 ENCOUNTER — Other Ambulatory Visit: Payer: Self-pay

## 2020-03-12 ENCOUNTER — Ambulatory Visit (INDEPENDENT_AMBULATORY_CARE_PROVIDER_SITE_OTHER): Payer: PPO | Admitting: *Deleted

## 2020-03-12 DIAGNOSIS — E538 Deficiency of other specified B group vitamins: Secondary | ICD-10-CM

## 2020-03-12 MED ORDER — CYANOCOBALAMIN 1000 MCG/ML IJ SOLN
1000.0000 ug | Freq: Once | INTRAMUSCULAR | Status: AC
  Administered 2020-03-12: 1000 ug via INTRAMUSCULAR

## 2020-03-12 NOTE — Progress Notes (Signed)
Per orders of Cory Nafziger, NP, injection of Cyanocobalamin 1000mcg given by Davonta Stroot A. Patient tolerated injection well. 

## 2020-03-16 ENCOUNTER — Ambulatory Visit: Payer: PPO

## 2020-03-30 ENCOUNTER — Other Ambulatory Visit: Payer: Self-pay | Admitting: Adult Health

## 2020-04-09 ENCOUNTER — Ambulatory Visit (INDEPENDENT_AMBULATORY_CARE_PROVIDER_SITE_OTHER): Payer: PPO

## 2020-04-09 ENCOUNTER — Other Ambulatory Visit: Payer: Self-pay

## 2020-04-09 DIAGNOSIS — E538 Deficiency of other specified B group vitamins: Secondary | ICD-10-CM

## 2020-04-09 MED ORDER — CYANOCOBALAMIN 1000 MCG/ML IJ SOLN
1000.0000 ug | Freq: Once | INTRAMUSCULAR | Status: AC
  Administered 2020-04-09: 1000 ug via INTRAMUSCULAR

## 2020-04-09 NOTE — Patient Instructions (Signed)
Health Maintenance Due  Topic Date Due  . COVID-19 Vaccine (1) Never done  . INFLUENZA VACCINE  02/16/2020    Depression screen [facility] 2/9 02/05/2020 10/09/2017 09/19/2016  Decreased Interest 0 0 0  Down, Depressed, Hopeless 0 0 0  PHQ - 2 Score 0 0 0

## 2020-04-09 NOTE — Progress Notes (Signed)
Per orders of Nafziger, Tommi Rumps, NP, injection of B12 given in right deltoid by Franco Collet. Patient tolerated injection well.  Lab Results  Component Value Date   [id number] 102 02/05/2020

## 2020-04-14 ENCOUNTER — Ambulatory Visit: Payer: PPO

## 2020-04-14 DIAGNOSIS — I10 Essential (primary) hypertension: Secondary | ICD-10-CM

## 2020-04-14 DIAGNOSIS — E782 Mixed hyperlipidemia: Secondary | ICD-10-CM

## 2020-04-14 NOTE — Patient Instructions (Addendum)
Hi Gabrielle Vargas!  It was so lovely to be able to speak to you over the phone! As discussed, keep up the great work with maintaining a healthy diet and lifestyle and continue to exercise as you have been. Don't forget to check your blood pressure once a week and I would encourage you to go ahead and get the Shingrix (shingles vaccine) as well as your annual flu shot.  I did attach some information on tips and tricks for helping with vertigo that you may want to look at.  Please let me know if you need anything before our next touch base!  Best, Maddie  Jeni Salles, PharmD Clinical Pharmacist [facility] [place]   Visit Information  Goals Addressed            This Visit's Progress   . Pharmacy Care Plan       CARE PLAN ENTRY  Current Barriers:  . Chronic Disease Management support, education, and care coordination needs related to HTN, HLD, and hypothyroidism, ADHD, vitamin B12 deficiency  Pharmacist Clinical Goal(s):  Gabrielle Vargas Work with the care management team to address educational, disease management, and care coordination needs  . Call provider office for new or worsened signs and symptoms  . Over the next month, continue self health monitoring activities as directed today (blood pressure monitoring).  . Call care management team with questions or concerns.  . Blood pressure:  Gabrielle Vargas Maintain blood pressure within goal of your provider (130/80 or 140/90). . Last blood pressure readings: 140/86 (02/05/20) . Maintain low salt diet.  . High cholesterol:  . Cholesterol goals: Total Cholesterol goal under 200, Triglycerides goal under 150, HDL goal above 40 (men) or above 50 (women), LDL goal under 100.  . Current cholesterol levels (02/05/2020) - Total cholesterol: 207 - Triglycerides: 216 - HDL: 60 - LDL: 110 . Hypothyroidism:  . Maintain TSH between 0.45 to 4.5uIU/ml. . Current TSH (02/05/2020): 1.60 . ADHD . Minimize symptoms associated with ADHD.   Gabrielle Vargas Vitamin B12 deficiency . Maintain Vitamin B-12 level: 211-911.  . Vitamin B12 level: 424 (08/07/2019); 604 (08/27/2019) . Lifestyle modifications . Continue working on lifestyle modifications (diet/exercise). . Continue staying active, engage in at least 150 minutes per week of moderate-intensity exercise such as brisk walking (15- to 20-minute mile) or something similar.   Interventions: . Comprehensive medication review performed. . Evaluation of current treatment plans and patient's adherence to plan as established by provider . Assessed patient understanding of disease states . Assessed patient's education and care coordination needs . Provided disease specific education to patient . Blood pressure:  . Discussed need to start checking blood pressure at home regularly  . Discussed diet modifications. DASH diet:  following a diet emphasizing fruits and vegetables and low-fat dairy products along with whole grains, fish, poultry, and nuts. Reducing red meats and sugars.  . Exercising.   . Reducing the amount of salt intake to 1500mg /per day.  . Recommend using a salt substitute to replace your salt if you need flavor.    . Continue: HCTZ 25mg , 1 tablet once daily . High cholesterol . How to reduce cholesterol through diet/weight management and physical activity.    . We discussed how a diet high in plant sterols (fruits/vegetables/nuts/whole grains/legumes) may reduce your cholesterol.  Encouraged increasing fiber to a daily intake of 10-25g/day  . Continue: simvastatin 20mg , 1 tablet every other day  . Hypothyroidism . Continue:Synthroid (levothyroxine) 8mcg, 1 tablet once daily . ADHD . Continue: amphetamine- dextroamphetamine (  Adderall) 20mg , 1 tablet twice daily . Vitamin B12 deficiency . Continue: cyanocobalamin 1065mcg IM every month   Patient Self Care Activities:  . Self administers medications as prescribed and Calls provider office for new concerns or  questions . Continue current medications as directed by providers.  . Continue following up with providers/ specialists. . Continue at home blood pressure readings. . Continue working on health habits (diet/ exercise).  Please see past updates related to this goal by clicking on the "Past Updates" button in the selected goal         The patient verbalized understanding of instructions provided today and declined a print copy of patient instruction materials.   Telephone follow up appointment with pharmacy team member scheduled for: March 2022   Vertigo Vertigo is the feeling that you or your surroundings are moving when they are not. This feeling can come and go at any time. Vertigo often goes away on its own. Vertigo can be dangerous if it occurs while you are doing something that could endanger you or others, such as driving or operating machinery. Your health care provider will do tests to determine the cause of your vertigo. Tests will also help your health care provider decide how best to treat your condition. Follow these instructions at home: Eating and drinking      Drink enough fluid to keep your urine pale yellow.  Do not drink alcohol. Activity  Return to your normal activities as told by your health care provider. Ask your health care provider what activities are safe for you.  In the morning, first sit up on the side of the bed. When you feel okay, stand slowly while you hold onto something until you know that your balance is fine.  Move slowly. Avoid sudden body or head movements or certain positions, as told by your health care provider.  If you have trouble walking or keeping your balance, try using a cane for stability. If you feel dizzy or unstable, sit down right away.  Avoid doing any tasks that would cause danger to you or others if vertigo occurs.  Avoid bending down if you feel dizzy. Place items in your home so that they are easy for you to reach  without leaning over.  Do not drive or use heavy machinery if you feel dizzy. General instructions  Take over-the-counter and prescription medicines only as told by your health care provider.  Keep all follow-up visits as told by your health care provider. This is important. Contact a health care provider if:  Your medicines do not relieve your vertigo or they make it worse.  You have a fever.  Your condition gets worse or you develop new symptoms.  Your family or friends notice any behavioral changes.  Your nausea or vomiting gets worse.  You have numbness or a prickling and tingling sensation in part of your body. Get help right away if you:  Have difficulty moving or speaking.  Are always dizzy.  Faint.  Develop severe headaches.  Have weakness in your hands, arms, or legs.  Have changes in your hearing or vision.  Develop a stiff neck.  Develop sensitivity to light. Summary  Vertigo is the feeling that you or your surroundings are moving when they are not.  Your health care provider will do tests to determine the cause of your vertigo.  Follow instructions for home care. You may be told to avoid certain tasks, positions, or movements.  Contact a health care provider  if your medicines do not relieve your symptoms, or if you have a fever, nausea, vomiting, or changes in behavior.  Get help right away if you have severe headaches or difficulty speaking, or you develop hearing or vision problems. This information is not intended to replace advice given to you by your health care provider. Make sure you discuss any questions you have with your health care provider. Document Revised: 05/28/2018 Document Reviewed: 05/28/2018 Elsevier Patient Education  2020 [employer].

## 2020-04-14 NOTE — Chronic Care Management (AMB) (Signed)
Chronic Care Management Pharmacy  Name: Gabrielle Vargas  MRN: [id number] DOB: July 04, 1951  Initial Questions: 1. Have you seen any other providers since your last visit? Yes - Cory Nafziger 2. Any changes in your medicines or health? No   Chief Complaint/ HPI Gabrielle Vargas,  69 y.o. , female presents for their Follow-Up CCM visit with the clinical pharmacist via telephone due to COVID-19 Pandemic.  PCP : [name], NP  Their chronic conditions include: HTN,HLD, Hypothyroidism, ADHD, vitamin B-12 deficiency   Office Visits: 04/09/20 Gwenyth Ober, [place] - Patient presented for monthly vitamin B12 injections.  02/05/2020- [name], NP: Patient presented for medical conditions follow up. CMP completed with glucose slightly elevated, lipid panel showed increased LDL and triglycerides. TSH and vit B12 WNL. No medication changes.  Medications: Outpatient Encounter Medications as of 04/14/2020  Medication Sig  . amphetamine-dextroamphetamine (ADDERALL) 20 MG tablet Take 1 tablet (20 mg total) by mouth 2 (two) times daily.  Marland Kitchen amphetamine-dextroamphetamine (ADDERALL) 20 MG tablet Take 1 tablet (20 mg total) by mouth 2 (two) times daily.  Marland Kitchen amphetamine-dextroamphetamine (ADDERALL) 20 MG tablet Take 1 tablet (20 mg total) by mouth 2 (two) times daily.  . cyanocobalamin (,VITAMIN B-12,) 1000 MCG/ML injection Inject 1,000 mcg into the muscle every 30 (thirty) days.  . hydrochlorothiazide (HYDRODIURIL) 25 MG tablet TAKE 1 TABLET(25 MG) BY MOUTH DAILY  . ibuprofen (ADVIL,MOTRIN) 200 MG tablet Take 200 mg by mouth every 6 (six) hours as needed.  . simvastatin (ZOCOR) 20 MG tablet Take 1 tablet (20 mg total) by mouth every other day.  Marland Kitchen SYNTHROID 75 MCG tablet TAKE 1 TABLET(75 MCG) BY MOUTH DAILY   No facility-administered encounter medications on file as of 04/14/2020.     Current Diagnosis/Assessment:  Goals Addressed            This Visit's Progress   . Pharmacy Care  Plan       CARE PLAN ENTRY  Current Barriers:  . Chronic Disease Management support, education, and care coordination needs related to HTN, HLD, and hypothyroidism, ADHD, vitamin B12 deficiency  Pharmacist Clinical Goal(s):  Marland Kitchen Work with the care management team to address educational, disease management, and care coordination needs  . Call provider office for new or worsened signs and symptoms  . Over the next month, continue self health monitoring activities as directed today (blood pressure monitoring).  . Call care management team with questions or concerns.  . Blood pressure:  Marland Kitchen Maintain blood pressure within goal of your provider (130/80 or 140/90). . Last blood pressure readings: 140/86 (02/05/20) . Maintain low salt diet.  . High cholesterol:  . Cholesterol goals: Total Cholesterol goal under 200, Triglycerides goal under 150, HDL goal above 40 (men) or above 50 (women), LDL goal under 100.  . Current cholesterol levels (02/05/2020) - Total cholesterol: 207 - Triglycerides: 216 - HDL: 60 - LDL: 110 . Hypothyroidism:  . Maintain TSH between 0.45 to 4.5uIU/ml. . Current TSH (02/05/2020): 1.60 . ADHD . Minimize symptoms associated with ADHD.  Marland Kitchen Vitamin B12 deficiency . Maintain Vitamin B-12 level: 211-911.  . Vitamin B12 level: 424 (08/07/2019); 604 (08/27/2019) . Lifestyle modifications . Continue working on lifestyle modifications (diet/exercise). . Continue staying active, engage in at least 150 minutes per week of moderate-intensity exercise such as brisk walking (15- to 20-minute mile) or something similar.   Interventions: . Comprehensive medication review performed. . Evaluation of current treatment plans and patient's adherence to plan as established by provider .  Assessed patient understanding of disease states . Assessed patient's education and care coordination needs . Provided disease specific education to patient . Blood pressure:  . Discussed need to start  checking blood pressure at home regularly  . Discussed diet modifications. DASH diet:  following a diet emphasizing fruits and vegetables and low-fat dairy products along with whole grains, fish, poultry, and nuts. Reducing red meats and sugars.  . Exercising.   . Reducing the amount of salt intake to 1500mg /per day.  . Recommend using a salt substitute to replace your salt if you need flavor.    . Continue: HCTZ 25mg , 1 tablet once daily . High cholesterol . How to reduce cholesterol through diet/weight management and physical activity.    . We discussed how a diet high in plant sterols (fruits/vegetables/nuts/whole grains/legumes) may reduce your cholesterol.  Encouraged increasing fiber to a daily intake of 10-25g/day  . Continue: simvastatin 20mg , 1 tablet every other day  . Hypothyroidism . Continue:Synthroid (levothyroxine) 87mcg, 1 tablet once daily . ADHD . Continue: amphetamine- dextroamphetamine (Adderall) 20mg , 1 tablet twice daily . Vitamin B12 deficiency . Continue: cyanocobalamin 1059mcg IM every month   Patient Self Care Activities:  . Self administers medications as prescribed and Calls provider office for new concerns or questions . Continue current medications as directed by providers.  . Continue following up with providers/ specialists. . Continue at home blood pressure readings. . Continue working on health habits (diet/ exercise).  Please see past updates related to this goal by clicking on the "Past Updates" button in the selected goal        SDOH Interventions     Most Recent Value  SDOH Interventions  Financial Strain Interventions Intervention Not Indicated  Transportation Interventions Intervention Not Indicated      Hypertension   Office blood pressures are  BP Readings from Last 3 Encounters:  02/05/20 140/86  08/07/19 140/82  01/31/19 124/80   Patient has failed these meds in the past: none   Patient checks BP at home  infrequently  Patient home BP readings are ranging: does not have one  Patient is controlled on:  - HCTZ 25mg , 1 tablet once daily (patient takes in the AM)   We discussed diet and exercise extensively . Diet: Patient is following a healthy diet and eats mostly vegetables and meat.  . Exercising: Patient and husband continue to walk about 1 to 2 hours every day depending on weather. . Reducing the amount of salt intake to 1500mg /per day.  . Recommend using a salt substitute to replace your salt if you need flavor.    . We discussed the importance of blood pressure monitoring at home while on a BP medication.  Plan Denies dizziness/lightheadedness.  Patient will start monitoring blood pressure at home. Continue current medications and control with diet and exercise.    Hyperlipidemia   Lipid Panel     Component Value Date/Time   CHOL 204 (H) 02/05/2020 0857   TRIG 216 (H) 02/05/2020 0857   HDL 60 02/05/2020 0857   CHOLHDL 3.4 02/05/2020 0857   VLDL 34.0 08/27/2019 1025   LDLCALC 110 (H) 02/05/2020 0857   LDLDIRECT 158.7 06/19/2014 0823    The 10-year ASCVD risk score Mikey Bussing DC Jr., et al., 2013) is: 16.4%   Values used to calculate the score:     Age: 60 years     Sex: Female     Is Non-Hispanic African American: No     Diabetic: No  Tobacco smoker: No     Systolic Blood Pressure: 956 mmHg     Is BP treated: Yes     HDL Cholesterol: 60 mg/dL     Total Cholesterol: 204 mg/dL   Patient has failed these meds in past:  Patient is currently controlled on the following medications:  -simvastatin 20mg , 1 tablet every other day   We discussed:  diet and exercise extensively  .  How to reduce cholesterol through diet/weight management and physical activity.  . We discussed how a diet high in plant sterols (fruits/vegetables/nuts/whole grains/legumes) may reduce your cholesterol.  Encouraged increasing fiber to a daily intake of 10-25g/day   Plan Patient notes no side  effects with increased dose.  Patient would benefit from higher intensity statin regimen - may consider switching to pravastatin or rosuvastatin to avoid muscle pain. Continue current medications and control with diet and exercise.    Hypothyroidism   TSH  Date Value Ref Range Status  02/05/2020 1.60 0.40 - 4.50 mIU/L Final    Patient has failed these meds in past: none  Patient is currently controlled on the following medications:  - Synthroid (levothyroxine 57mcg, 1 tablet once daily  (patient reports can only take Brand name)  We discussed:  consistent administration (patient reports taking at least 30 minutes before breakfast).   Plan Continue current medications  ADHD   Patient is currently controlled on the following medications:  - amphetamine- dextroamphetamine (Adderall) 20mg , 1 tablet twice daily  Plan Patient requested refill.   Continue current medications.   Vitamin B-12 deficiency   Patient is currently managed on the following medications:  - cyanocobalamin 1065mcg IM every month   Vitamin B12:  424 (02/05/20) 604 (08/27/2019)  184 (08/07/2019)   Plan Recommend vitamin B12 monitoring every 3 to 6 months.  Continue current medications  Occasional pain    Patient has failed these meds in past: none  Patient is currently controlled on the following medications:  - ibuprofen 200mg , 1 tablet every six hours as needed (patient notes taking after long walks. She states she does not take very often).   Plan Continue current medications   Vertigo  Patient reports vertigo comes on with the change in seasons but hasn't had an episode in over a year. Patient has failed these meds in past:none Patient is currently controlled on the following medications:  . No medications  We discussed:   Medicines to help with vertigo and making sure to move slowly, find a quiet place to lay down in and avoiding stressors that may trigger it; recommended trying Bonine or  Dramamine OTC   Plan Patient will try Bonine (meclizine) or Dramamine over the counter if it does come on.  Continue without medications.  Vaccines   Reviewed and discussed patient's vaccination history.    Immunization History  Administered Date(s) Administered  . Influenza Split 05/08/2012, 04/01/2013  . Influenza Whole 05/09/2007, 04/22/2008, 04/17/2009, 04/19/2010  . Influenza, High Dose Seasonal PF 04/29/2017, 04/19/2018  . Influenza-Unspecified 03/19/2014, 03/31/2015  . Pneumococcal Conjugate-13 10/16/2013  . Pneumococcal Polysaccharide-23 08/18/2015  . Td 01/03/2005  . Tdap 01/27/2016  . Zoster 03/31/2015    Plan Recommended patient receive Shingrix and influenza vaccine in office/at pharmacy.   Patient refuses COVID vaccine at this time. Discussed at length and encouraged patient to do her own research and not listen to anecdotal information from others.  Medication Management   Pt uses [facility] for all medications Uses pill box? Yes - pill tower (  color coded) Pt endorses 100% compliance (very rarely)  We discussed: Current pharmacy is preferred with insurance plan and patient is satisfied with pharmacy services  Plan  Continue current medication management strategy  Follow up: 6 month phone visit  Jeni Salles, PharmD Clinical Pharmacist [facility] [place] [phone]

## 2020-04-15 ENCOUNTER — Other Ambulatory Visit: Payer: Self-pay

## 2020-04-15 ENCOUNTER — Ambulatory Visit (INDEPENDENT_AMBULATORY_CARE_PROVIDER_SITE_OTHER)
Admission: RE | Admit: 2020-04-15 | Discharge: 2020-04-15 | Disposition: A | Discharge status: Home / Self Care | Payer: PPO | Source: Ambulatory Visit | Attending: Adult Health | Admitting: Adult Health

## 2020-04-15 DIAGNOSIS — I700 Atherosclerosis of aorta: Secondary | ICD-10-CM | POA: Diagnosis not present

## 2020-04-15 DIAGNOSIS — I2510 Atherosclerotic heart disease of native coronary artery without angina pectoris: Secondary | ICD-10-CM | POA: Diagnosis not present

## 2020-04-15 DIAGNOSIS — R918 Other nonspecific abnormal finding of lung field: Secondary | ICD-10-CM | POA: Diagnosis not present

## 2020-04-15 DIAGNOSIS — R911 Solitary pulmonary nodule: Secondary | ICD-10-CM | POA: Diagnosis not present

## 2020-04-29 ENCOUNTER — Telehealth: Payer: Self-pay | Admitting: Adult Health

## 2020-04-29 NOTE — Telephone Encounter (Signed)
Left message for patient to schedule Annual Wellness Visit.  Please schedule with Nurse Health Advisor Shannon Crews, RN at [facility]  

## 2020-05-07 ENCOUNTER — Other Ambulatory Visit: Payer: Self-pay

## 2020-05-07 ENCOUNTER — Ambulatory Visit (INDEPENDENT_AMBULATORY_CARE_PROVIDER_SITE_OTHER): Payer: PPO | Admitting: *Deleted

## 2020-05-07 DIAGNOSIS — E538 Deficiency of other specified B group vitamins: Secondary | ICD-10-CM | POA: Diagnosis not present

## 2020-05-07 MED ORDER — CYANOCOBALAMIN 1000 MCG/ML IJ SOLN
1000.0000 ug | Freq: Once | INTRAMUSCULAR | Status: AC
  Administered 2020-05-07: 1000 ug via INTRAMUSCULAR

## 2020-05-07 NOTE — Progress Notes (Addendum)
Pt came to receive Vitamin B12 IM injection . Pt given  Injection in Left arm Deltoid. Pt had no concerns with  Injections

## 2020-06-02 ENCOUNTER — Other Ambulatory Visit: Payer: Self-pay | Admitting: Adult Health

## 2020-06-02 ENCOUNTER — Telehealth: Payer: Self-pay

## 2020-06-02 MED ORDER — AMPHETAMINE-DEXTROAMPHETAMINE 20 MG PO TABS: 20.0000 mg | ORAL_TABLET | Freq: Two times a day (BID) | ORAL | 0 refills | Status: DC

## 2020-06-02 NOTE — Telephone Encounter (Signed)
Patient called in requesting a refill   amphetamine-dextroamphetamine (ADDERALL) 20 MG tablet   [facility] [place], [place] - [address] AT [address]

## 2020-06-02 NOTE — Telephone Encounter (Signed)
Please advise her that her Adderall has been sent in for 3 months.  She will be due for 49-month follow-up regarding Adderall in January.  This can be done virtually

## 2020-06-02 NOTE — Telephone Encounter (Signed)
Patient informed of the message below and a follow up appt was scheduled for 07/22/2020.

## 2020-06-10 ENCOUNTER — Ambulatory Visit (INDEPENDENT_AMBULATORY_CARE_PROVIDER_SITE_OTHER): Payer: PPO | Admitting: *Deleted

## 2020-06-10 ENCOUNTER — Other Ambulatory Visit: Payer: Self-pay

## 2020-06-10 DIAGNOSIS — E538 Deficiency of other specified B group vitamins: Secondary | ICD-10-CM

## 2020-06-10 MED ORDER — CYANOCOBALAMIN 1000 MCG/ML IJ SOLN
1000.0000 ug | Freq: Once | INTRAMUSCULAR | Status: AC
  Administered 2020-06-10: 1000 ug via INTRAMUSCULAR

## 2020-06-10 NOTE — Progress Notes (Signed)
Pt is here today for B12 injection. Pt was given B12 injection in right deltoid. Pt tolerated well 

## 2020-06-24 DIAGNOSIS — H524 Presbyopia: Secondary | ICD-10-CM | POA: Diagnosis not present

## 2020-06-28 ENCOUNTER — Other Ambulatory Visit: Payer: Self-pay | Admitting: Adult Health

## 2020-07-22 ENCOUNTER — Other Ambulatory Visit: Payer: Self-pay

## 2020-07-22 ENCOUNTER — Ambulatory Visit (INDEPENDENT_AMBULATORY_CARE_PROVIDER_SITE_OTHER): Payer: PPO | Admitting: Adult Health

## 2020-07-22 ENCOUNTER — Encounter: Payer: Self-pay | Admitting: Adult Health

## 2020-07-22 VITALS — BP 136/90 | Temp 97.9°F | Ht 59.0 in | Wt 119.0 lb

## 2020-07-22 DIAGNOSIS — F909 Attention-deficit hyperactivity disorder, unspecified type: Secondary | ICD-10-CM

## 2020-07-22 NOTE — Progress Notes (Signed)
Subjective:    Patient ID: Gabrielle Vargas, female    DOB: 1950-10-22, 70 y.o.   MRN: [id number]  HPI 70 year old female who is being evaluated today for 45-month follow-up regarding ADHD.  He is currently prescribed Adderall 20 mg twice daily.  She does take this medication daily during the week but may skip a dose during the weekend when she does not need to focus as much.  She does feel well controlled on this medication and sees no need to change her dose  Reviewed controlled substance database and no red flags noted   Review of Systems See HPI   Past Medical History:  Diagnosis Date  . Arthritis    Osteo  . Asthma    Seasonal  . Diverticula, colon   . Hyperlipidemia   . Hypertension   . Hypothyroidism     Social History   Socioeconomic History  . Marital status: Married    Spouse name: Not on file  . Number of children: 2  . Years of education: Not on file  . Highest education level: Not on file  Occupational History  . Occupation: Retired  Tobacco Use  . Smoking status: Never Smoker  . Smokeless tobacco: Never Used  Substance and Sexual Activity  . Alcohol use: No  . Drug use: No  . Sexual activity: Not on file  Other Topics Concern  . Not on file  Social History Narrative   Retired   Married      Social Determinants of Corporate investment banker Strain: Low Risk   . Difficulty of Paying Living Expenses: Not hard at all  Food Insecurity: Not on file  Transportation Needs: No Transportation Needs  . Lack of Transportation (Medical): No  . Lack of Transportation (Non-Medical): No  Physical Activity: Not on file  Stress: Not on file  Social Connections: Not on file  Intimate Partner Violence: Not on file    Past Surgical History:  Procedure Laterality Date  . ABDOMINAL HYSTERECTOMY    . BREAST SURGERY    . CARPAL TUNNEL RELEASE Bilateral   . CERVICAL DISC ARTHROPLASTY    . CESAREAN SECTION     x2  . left shoulder surgery    . MENISCUS  REPAIR Right 2017  . REDUCTION MAMMAPLASTY Bilateral   . right shoulder    . TONSILLECTOMY      Family History  Problem Relation Age of Onset  . Kidney disease Father   . Diabetes Father   . Heart disease Father   . Melanoma Mother   . Colon cancer Neg Hx   . Esophageal cancer Neg Hx   . Rectal cancer Neg Hx   . Stomach cancer Neg Hx     Allergies  Allergen Reactions  . Latex     REACTION: rash/hives  . Oxycodone-Acetaminophen     REACTION: nausea    Current Outpatient Medications on File Prior to Visit  Medication Sig Dispense Refill  . amphetamine-dextroamphetamine (ADDERALL) 20 MG tablet Take 1 tablet (20 mg total) by mouth 2 (two) times daily. 60 tablet 0  . amphetamine-dextroamphetamine (ADDERALL) 20 MG tablet Take 1 tablet (20 mg total) by mouth 2 (two) times daily. 60 tablet 0  . amphetamine-dextroamphetamine (ADDERALL) 20 MG tablet Take 1 tablet (20 mg total) by mouth 2 (two) times daily. 60 tablet 0  . hydrochlorothiazide (HYDRODIURIL) 25 MG tablet TAKE 1 TABLET(25 MG) BY MOUTH DAILY 90 tablet 3  . ibuprofen (ADVIL,MOTRIN) 200 MG tablet Take  200 mg by mouth every 6 (six) hours as needed.    Marland Kitchen SYNTHROID 75 MCG tablet TAKE 1 TABLET(75 MCG) BY MOUTH DAILY 90 tablet 3  . vitamin B-12 (CYANOCOBALAMIN) 1000 MCG tablet Take 1,000 mcg by mouth 2 (two) times a week.    . simvastatin (ZOCOR) 20 MG tablet Take 1 tablet (20 mg total) by mouth every other day. 45 tablet 3   No current facility-administered medications on file prior to visit.    BP 136/90   Temp 97.9 F (36.6 C)   Ht 4\' 11"  (1.499 m)   Wt 119 lb (54 kg)   BMI 24.04 kg/m       Objective:   Physical Exam Vitals and nursing note reviewed.  Constitutional:      Appearance: Normal appearance.  Cardiovascular:     Rate and Rhythm: Normal rate and regular rhythm.     Pulses: Normal pulses.     Heart sounds: Normal heart sounds.  Pulmonary:     Effort: Pulmonary effort is normal.     Breath sounds:  Normal breath sounds.  Skin:    General: Skin is warm.     Capillary Refill: Capillary refill takes less than 2 seconds.  Neurological:     General: No focal deficit present.     Mental Status: She is alert and oriented to person, place, and time.  Psychiatric:        Mood and Affect: Mood normal.        Behavior: Behavior normal.        Thought Content: Thought content normal.        Judgment: Judgment normal.        Assessment & Plan:  1. Attention deficit hyperactivity disorder (ADHD), unspecified ADHD type - Continue with Adderall 20 mg BID - She does not need a refill as this time  - Follow up in 6 months for CPE   [name], NP

## 2020-09-10 DIAGNOSIS — M1712 Unilateral primary osteoarthritis, left knee: Secondary | ICD-10-CM | POA: Diagnosis not present

## 2020-09-26 ENCOUNTER — Other Ambulatory Visit: Payer: Self-pay | Admitting: Adult Health

## 2020-09-26 DIAGNOSIS — E782 Mixed hyperlipidemia: Secondary | ICD-10-CM

## 2020-10-05 ENCOUNTER — Telehealth: Payer: Self-pay | Admitting: Adult Health

## 2020-10-05 NOTE — Telephone Encounter (Signed)
Patient is calling and requesting a refill for amphetamine-dextroamphetamine (ADDERALL) 20 MG tablet to be sent to [facility] [place], [place] [address]  Phone:  [phone] Fax:  [phone]  CB is [phone]

## 2020-10-05 NOTE — Telephone Encounter (Signed)
Last office visit- 07/22/2020 Last refill--06/02/2020--60 tabs 2 different refills

## 2020-10-06 MED ORDER — AMPHETAMINE-DEXTROAMPHETAMINE 20 MG PO TABS: 20.0000 mg | ORAL_TABLET | Freq: Two times a day (BID) | ORAL | 0 refills | Status: DC

## 2020-10-08 ENCOUNTER — Telehealth: Payer: Self-pay | Admitting: Pharmacist

## 2020-10-08 NOTE — Chronic Care Management (AMB) (Addendum)
I left the patient a message about her upcoming appointment on 10/09/2020 @ 11:30 am with the clinical pharmacist. She was asked to please have all medication on hand to review with the pharmacist.   Neita Goodnight) Mare Ferrari, [place] [phone]    Patient called back to reschedule appointment for April 7th.  Jeni Salles, PharmD [facility] Clinical Pharmacist [place] [facility] [place]

## 2020-10-09 ENCOUNTER — Telehealth: Payer: PPO

## 2020-10-21 ENCOUNTER — Telehealth: Payer: Self-pay | Admitting: Pharmacist

## 2020-10-21 NOTE — Chronic Care Management (AMB) (Signed)
I spoke with the patient about her upcoming appointment on 10/22/2020 @ 2:00 pm  with the clinical pharmacist. She was asked to please have all medication on hand to review with the pharmacist. She confirmed the appointment.   Gabrielle Vargas, [place] [phone]

## 2020-10-22 ENCOUNTER — Ambulatory Visit (INDEPENDENT_AMBULATORY_CARE_PROVIDER_SITE_OTHER): Payer: PPO | Admitting: Pharmacist

## 2020-10-22 DIAGNOSIS — I10 Essential (primary) hypertension: Secondary | ICD-10-CM

## 2020-10-22 DIAGNOSIS — E039 Hypothyroidism, unspecified: Secondary | ICD-10-CM

## 2020-10-22 DIAGNOSIS — E782 Mixed hyperlipidemia: Secondary | ICD-10-CM | POA: Diagnosis not present

## 2020-10-22 NOTE — Progress Notes (Signed)
Chronic Care Management Pharmacy Note  11/06/2020 Name:  Gabrielle Vargas MRN:  [id number] DOB:  1951/05/14  Subjective: Gabrielle Vargas is an 70 y.o. year old female who is a primary patient of [name], NP.  The CCM team was consulted for assistance with disease management and care coordination needs.    Engaged with patient by telephone for follow up visit in response to provider referral for pharmacy case management and/or care coordination services.   Consent to Services:  The patient was given information about Chronic Care Management services, agreed to services, and gave verbal consent prior to initiation of services.  Please see initial visit note for detailed documentation.   Patient Care Team: [name], NP as PCP - General (Family Medicine) Viona Gilmore, [facility] as Pharmacist (Pharmacist)  Recent office visits: 07/22/20 [name], NP: Patient presented for ADHD follow up. Plan for CPE in 6 months.  06/10/20 Patient presented for monthly vitamin B12 injections.  Recent consult visits: 09/10/20 Elias Else, PA-C (sports medicine): Patient presented for knee pain.  06/24/20 Laurence Aly (optometry): Unable to access notes.  Hospital visits: None in previous 6 months  Objective:  Lab Results  Component Value Date   CREATININE 0.80 02/05/2020   BUN 16 02/05/2020   GFR 82.65 01/31/2019   GFRNONAA 74 02/05/2020   GFRAA 86 02/05/2020   NA 139 02/05/2020   K 3.8 02/05/2020   CALCIUM 10.0 02/05/2020   CO2 29 02/05/2020   GLUCOSE 102 (H) 02/05/2020    Lab Results  Component Value Date/Time   GFR 82.65 01/31/2019 10:08 AM   GFR 101.44 10/09/2017 10:17 AM    Last diabetic Eye exam: No results found for: HMDIABEYEEXA  Last diabetic Foot exam: No results found for: HMDIABFOOTEX   Lab Results  Component Value Date   CHOL 204 (H) 02/05/2020   HDL 60 02/05/2020   LDLCALC 110 (H) 02/05/2020   LDLDIRECT 158.7 06/19/2014   TRIG 216 (H)  02/05/2020   CHOLHDL 3.4 02/05/2020    Hepatic Function Latest Ref Rng & Units 02/05/2020 01/31/2019 10/09/2017  Total Protein 6.1 - 8.1 g/dL 6.9 7.1 7.2  Albumin 3.5 - 5.2 g/dL - 4.7 4.1  AST 10 - 35 U/L '17 18 24  ' ALT 6 - 29 U/L '16 15 20  ' Alk Phosphatase 39 - 117 U/L - 108 111  Total Bilirubin 0.2 - 1.2 mg/dL 0.5 0.6 0.5  Bilirubin, Direct 0.0 - 0.3 mg/dL - - 0.1    Lab Results  Component Value Date/Time   TSH 1.60 02/05/2020 08:57 AM   TSH 1.16 08/07/2019 03:54 PM   FREET4 1.07 02/25/2019 09:48 AM    CBC Latest Ref Rng & Units 02/05/2020 08/07/2019 01/31/2019  WBC 3.8 - 10.8 Thousand/uL 8.3 7.6 5.8  Hemoglobin 11.7 - 15.5 g/dL 14.9 14.6 14.5  Hematocrit 35.0 - 45.0 % 44.8 43.0 43.2  Platelets 140 - 400 Thousand/uL 238 272.0 263.0    No results found for: VD25OH  Clinical ASCVD: No  The 10-year ASCVD risk score Mikey Bussing DC Jr., et al., 2013) is: 16.3%   Values used to calculate the score:     Age: 71 years     Sex: Female     Is Non-Hispanic African American: No     Diabetic: No     Tobacco smoker: No     Systolic Blood Pressure: 425 mmHg     Is BP treated: Yes     HDL Cholesterol: 60 mg/dL  Total Cholesterol: 204 mg/dL    Depression screen [facility] 2/9 02/05/2020 10/09/2017 09/19/2016  Decreased Interest 0 0 0  Down, Depressed, Hopeless 0 0 0  PHQ - 2 Score 0 0 0      Social History   Tobacco Use  Smoking Status Never Smoker  Smokeless Tobacco Never Used   BP Readings from Last 3 Encounters:  07/22/20 136/90  02/05/20 140/86  08/07/19 140/82   Pulse Readings from Last 3 Encounters:  08/07/19 (!) 104  10/30/17 95  10/09/17 84   Wt Readings from Last 3 Encounters:  07/22/20 119 lb (54 kg)  02/05/20 117 lb (53.1 kg)  08/07/19 122 lb (55.3 kg)   BMI Readings from Last 3 Encounters:  07/22/20 24.04 kg/m  02/05/20 23.63 kg/m  08/07/19 25.06 kg/m    Assessment/Interventions: Review of patient past medical history, allergies, medications, health status,  including review of consultants reports, laboratory and other test data, was performed as part of comprehensive evaluation and provision of chronic care management services.   SDOH:  (Social Determinants of Health) assessments and interventions performed: No  SDOH Screenings   Alcohol Screen: Not on file  Depression (PHQ2-9): Low Risk   . PHQ-2 Score: 0  Financial Resource Strain: Low Risk   . Difficulty of Paying Living Expenses: Not hard at all  Food Insecurity: Not on file  Housing: Not on file  Physical Activity: Not on file  Social Connections: Not on file  Stress: Not on file  Tobacco Use: Low Risk   . Smoking Tobacco Use: Never Smoker  . Smokeless Tobacco Use: Never Used  Transportation Needs: No Transportation Needs  . Lack of Transportation (Medical): No  . Lack of Transportation (Non-Medical): No    CCM Care Plan  Allergies  Allergen Reactions  . Latex     REACTION: rash/hives  . Oxycodone-Acetaminophen     REACTION: nausea    Medications Reviewed Today    Reviewed by Hulda Humphrey, CMA (Certified Medical Assistant) on 07/22/20 at [phone]  Med List Status: <None>  Medication Order Taking? Sig Documenting Provider Last Dose Status Informant  amphetamine-dextroamphetamine (ADDERALL) 20 MG tablet [id number] Yes Take 1 tablet (20 mg total) by mouth 2 (two) times daily. Nafziger, Tommi Rumps, NP Taking Active   amphetamine-dextroamphetamine (ADDERALL) 20 MG tablet [id number] Yes Take 1 tablet (20 mg total) by mouth 2 (two) times daily. Nafziger, Tommi Rumps, NP Taking Active   amphetamine-dextroamphetamine (ADDERALL) 20 MG tablet [id number] Yes Take 1 tablet (20 mg total) by mouth 2 (two) times daily. Nafziger, Tommi Rumps, NP Taking Active   hydrochlorothiazide (HYDRODIURIL) 25 MG tablet [id number] Yes TAKE 1 TABLET(25 MG) BY MOUTH DAILY Nafziger, Tommi Rumps, NP Taking Active   ibuprofen (ADVIL,MOTRIN) 200 MG tablet [id number] Yes Take 200 mg by mouth every 6 (six) hours as needed. [provider] Taking Active   simvastatin (ZOCOR) 20 MG tablet [id number]  Take 1 tablet (20 mg total) by mouth every other day. [name], NP  Active   SYNTHROID 75 MCG tablet [id number] Yes TAKE 1 TABLET(75 MCG) BY MOUTH DAILY Nafziger, Tommi Rumps, NP Taking Active   vitamin B-12 (CYANOCOBALAMIN) 1000 MCG tablet [id number] Yes Take 1,000 mcg by mouth daily. [provider] Taking Active           Patient Active Problem List   Diagnosis Date Noted  . Essential hypertension 10/09/2017  . Low back pain radiating to right leg 10/09/2017  . Mild intermittent asthma with acute exacerbation 05/30/2017  . Routine general  medical examination at a health care facility 08/18/2015  . Diverticulitis of colon (without mention of hemorrhage)(562.11) 09/17/2013  . VAGINITIS, ATROPHIC, SEVERE 08/18/2008  . Hypothyroidism 05/09/2007  . Attention deficit disorder 05/09/2007    Immunization History  Administered Date(s) Administered  . Influenza Split 05/08/2012, 04/01/2013  . Influenza Whole 05/09/2007, 04/22/2008, 04/17/2009, 04/19/2010  . Influenza, High Dose Seasonal PF 04/29/2017, 04/19/2018, 04/17/2020  . Influenza-Unspecified 03/19/2014, 03/31/2015  . Pneumococcal Conjugate-13 10/16/2013  . Pneumococcal Polysaccharide-23 08/18/2015  . Td 01/03/2005  . Tdap 01/27/2016  . Zoster 03/31/2015   HTN,HLD, Hypothyroidism, ADHD, vitamin B-12 deficiency   Wanted to go bowling when her and her twin sister wanted to go bowling - and went out to dinner  Conditions to be addressed/monitored:  Hypertension, Hyperlipidemia, Hypothyroidism and ADHD, vitamin B12 deficiency  Care Plan : Clever  Updates made by Viona Gilmore, [place] since 11/06/2020 12:00 AM    Problem: Problem: Hypertension, Hyperlipidemia, Hypothyroidism and ADHD, vitamin B12 deficiency     Long-Range Goal: Patient-Specific Goal   Start Date: 10/22/2020  Expected End Date: 10/22/2021  This Visit's Progress: On  track  Priority: High  Note:   Current Barriers:  . Unable to independently monitor therapeutic efficacy  Pharmacist Clinical Goal(s):  Marland Kitchen Patient will achieve adherence to monitoring guidelines and medication adherence to achieve therapeutic efficacy through collaboration with PharmD and provider.   Interventions: . 1:1 collaboration with [name], NP regarding development and update of comprehensive plan of care as evidenced by provider attestation and co-signature . Inter-disciplinary care team collaboration (see longitudinal plan of care) . Comprehensive medication review performed; medication list updated in electronic medical record  Hypertension (BP goal <140/90) -Controlled -Current treatment: . HCTZ 77m, 1 tablet once daily (patient takes in the AM)  -Medications previously tried: none  -Current home readings: not checking at home consistently through spells) -Current dietary habits: Patient is following a healthy diet and eats mostly vegetables and meat -Current exercise habits: Patient and husband continue to walk about 1 to 2 hours every day depending on weather -Denies hypotensive/hypertensive symptoms -Educated on Importance of home blood pressure monitoring; Proper BP monitoring technique; -Counseled to monitor BP at home weekly, document, and provide log at future appointments -Counseled on diet and exercise extensively Recommended to continue current medication  Hyperlipidemia: (LDL goal < 100) -Uncontrolled -Current treatment: . simvastatin 238m 1 tablet every other day -Medications previously tried: eating a lot of vegetables -Current dietary patterns: ice cream is her weakness -Current exercise habits: walking every day -Educated on Cholesterol goals;  Importance of limiting foods high in cholesterol; Exercise goal of 150 minutes per week; -Counseled on diet and exercise extensively Recommended to continue current medication Patient would benefit  from higher intensity statin   Hypothyroidism (Goal: TSH 0.35-4.5) -Controlled -Current treatment  . Synthroid (levothyroxine 7524m 1 tablet once daily  (patient reports can only take Brand name) -Medications previously tried: none  -Recommended to continue current medication  ADHD (Goal: minimize symptoms and maintain focus) -Controlled -Current treatment  . Amphetamine - dextroamphetamine (Adderall) 36m67m tablet twice daily -Medications previously tried: none  -Recommended to continue current medication Counseled on trying to see if she can use less and still remain focused. Recommended trial of 1 tablet twice daily alternating with once daily.  Vitamin B12 deficiency (Goal: 211-911) -Controlled -Current treatment  . Vitamin B12 SL 1000 mcg 1 tablet once or twice a week -Medications previously tried: vitamin B12 injection  -Recommended repeat vitamin  B12 level  Health Maintenance -Vaccine gaps: COVID (patient declines), shingrix -Current therapy:   ibuprofen 261m, 1 tablet every six hours as needed (patient notes taking after long walks. She states she does not take very often)  Vitamin C 1000 mg daily  Vitamin D 2000 units daily  Zinc 50 mg daily -Educated on Cost vs benefit of each product must be carefully weighed by individual consumer -Patient is satisfied with current therapy and denies issues -Recommended to continue current medication  Patient Goals/Self-Care Activities . Patient will:  - take medications as prescribed check blood pressure weekly, document, and provide at future appointments target a minimum of 150 minutes of moderate intensity exercise weekly  Follow Up Plan: Telephone follow up appointment with care management team member scheduled for: 6 months       Medication Assistance: None required.  Patient affirms current coverage meets needs.  [facility] [id number]: [phone]: [phone] Uses pill box? No - uses color coded pilltower Pt endorses 100% compliance  We discussed: Current pharmacy is preferred with insurance plan and patient is satisfied with pharmacy services Patient decided to: Continue current medication management strategy  Care Plan and Follow Up Patient Decision:  Patient agrees to Care Plan and Follow-up.  Plan: Telephone follow up appointment with care management team member scheduled for:  6 months  MJeni Salles PharmD BLake BryanPharmacist [place]at [phone]

## 2020-11-06 NOTE — Patient Instructions (Addendum)
Hi Gabrielle Vargas,  It was great to get to speak with you again! Below is a summary of some of the topics we discussed.   Don't forget to keep on checking your blood pressures weekly at home! Keep me updated on if you decide or try to slowly take less of the Adderall as we discussed (alternating between twice daily andonce daily).  Please reach out to me if you have any questions or need anything before our follow up!  Best, Maddie  Jeni Salles, PharmD, [facility] [place]  Visit Information  Goals Addressed   None    Patient Care Plan: CCM Pharmacy Care Plan    Problem Identified: Problem: Hypertension, Hyperlipidemia, Hypothyroidism and ADHD, vitamin B12 deficiency     Long-Range Goal: Patient-Specific Goal   Start Date: 10/22/2020  Expected End Date: 10/22/2021  This Visit's Progress: On track  Priority: High  Note:   Current Barriers:  . Unable to independently monitor therapeutic efficacy  Pharmacist Clinical Goal(s):  Marland Kitchen Patient will achieve adherence to monitoring guidelines and medication adherence to achieve therapeutic efficacy through collaboration with PharmD and provider.   Interventions: . 1:1 collaboration with [name], NP regarding development and update of comprehensive plan of care as evidenced by provider attestation and co-signature . Inter-disciplinary care team collaboration (see longitudinal plan of care) . Comprehensive medication review performed; medication list updated in electronic medical record  Hypertension (BP goal <140/90) -Controlled -Current treatment: . HCTZ 25mg , 1 tablet once daily (patient takes in the AM)  -Medications previously tried: none  -Current home readings: not checking at home consistently through spells) -Current dietary habits: Patient is following a healthy diet and eats mostly vegetables and meat -Current exercise habits: Patient and husband continue to walk about  1 to 2 hours every day depending on weather -Denies hypotensive/hypertensive symptoms -Educated on Importance of home blood pressure monitoring; Proper BP monitoring technique; -Counseled to monitor BP at home weekly, document, and provide log at future appointments -Counseled on diet and exercise extensively Recommended to continue current medication  Hyperlipidemia: (LDL goal < 100) -Uncontrolled -Current treatment: . simvastatin 20mg , 1 tablet every other day -Medications previously tried: eating a lot of vegetables -Current dietary patterns: ice cream is her weakness -Current exercise habits: walking every day -Educated on Cholesterol goals;  Importance of limiting foods high in cholesterol; Exercise goal of 150 minutes per week; -Counseled on diet and exercise extensively Recommended to continue current medication Patient would benefit from higher intensity statin   Hypothyroidism (Goal: TSH 0.35-4.5) -Controlled -Current treatment  . Synthroid (levothyroxine 54mcg, 1 tablet once daily  (patient reports can only take Brand name) -Medications previously tried: none  -Recommended to continue current medication  ADHD (Goal: minimize symptoms and maintain focus) -Controlled -Current treatment  . Amphetamine - dextroamphetamine (Adderall) 20mg , 1 tablet twice daily -Medications previously tried: none  -Recommended to continue current medication Counseled on trying to see if she can use less and still remain focused. Recommended trial of 1 tablet twice daily alternating with once daily.  Vitamin B12 deficiency (Goal: 211-911) -Controlled -Current treatment  . Vitamin B12 SL 1000 mcg 1 tablet once or twice a week -Medications previously tried: vitamin B12 injection  -Recommended repeat vitamin B12 level  Health Maintenance -Vaccine gaps: COVID (patient declines), shingrix -Current therapy:   ibuprofen 200mg , 1 tablet every six hours as needed (patient notes taking after  long walks. She states she does not take very often)  Vitamin C 1000  mg daily  Vitamin D 2000 units daily  Zinc 50 mg daily -Educated on Cost vs benefit of each product must be carefully weighed by individual consumer -Patient is satisfied with current therapy and denies issues -Recommended to continue current medication  Patient Goals/Self-Care Activities . Patient will:  - take medications as prescribed check blood pressure weekly, document, and provide at future appointments target a minimum of 150 minutes of moderate intensity exercise weekly  Follow Up Plan: Telephone follow up appointment with care management team member scheduled for: 6 months       Patient verbalizes understanding of instructions provided today and agrees to view in [place].  Telephone follow up appointment with pharmacy team member scheduled for: 6 months  Viona Gilmore, [facility]  How to Take Your Blood Pressure Blood pressure measures how strongly your blood is pressing against the walls of your arteries. Arteries are blood vessels that carry blood from your heart throughout your body. You can take your blood pressure at home with a machine. You may need to check your blood pressure at home:  To check if you have high blood pressure (hypertension).  To check your blood pressure over time.  To make sure your blood pressure medicine is working. Supplies needed:  Blood pressure machine, or monitor.  Dining room chair to sit in.  Table or desk.  Small notebook.  Pencil or pen. How to prepare Avoid these things for 30 minutes before checking your blood pressure:  Having drinks with caffeine in them, such as coffee or tea.  Drinking alcohol.  Eating.  Smoking.  Exercising. Do these things five minutes before checking your blood pressure:  Go to the bathroom and pee (urinate).  Sit in a dining chair. Do not sit in a soft couch or an armchair.  Be quiet. Do not talk. How to take your  blood pressure Follow the instructions that came with your machine. If you have a digital blood pressure monitor, these may be the instructions: 1. Sit up straight. 2. Place your feet on the floor. Do not cross your ankles or legs. 3. Rest your left arm at the level of your heart. You may rest it on a table, desk, or chair. 4. Pull up your shirt sleeve. 5. Wrap the blood pressure cuff around the upper part of your left arm. The cuff should be 1 inch (2.5 cm) above your elbow. It is best to wrap the cuff around bare skin. 6. Fit the cuff snugly around your arm. You should be able to place only one finger between the cuff and your arm. 7. Place the cord so that it rests in the bend of your elbow. 8. Press the power button. 9. Sit quietly while the cuff fills with air and loses air. 10. Write down the numbers on the screen. 11. Wait 2-3 minutes and then repeat steps 1-10.   What do the numbers mean? Two numbers make up your blood pressure. The first number is called systolic pressure. The second is called diastolic pressure. An example of a blood pressure reading is "120 over 80" (or 120/80). If you are an adult and do not have a medical condition, use this guide to find out if your blood pressure is normal: Normal  First number: below 120.  Second number: below 80. Elevated  First number: 120-129.  Second number: below 80. Hypertension stage 1  First number: 130-139.  Second number: 80-89. Hypertension stage 2  First number: 140 or above.  Second number: 74 or above. Your blood pressure is above normal even if only the top or bottom number is above normal. Follow these instructions at home:  Check your blood pressure as often as your doctor tells you to.  Check your blood pressure at the same time every day.  Take your monitor to your next doctor's appointment. Your doctor will: ? Make sure you are using it correctly. ? Make sure it is working right.  Make sure you  understand what your blood pressure numbers should be.  Tell your doctor if your medicine is causing side effects.  Keep all follow-up visits as told by your doctor. This is important. General tips:  You will need a blood pressure machine, or monitor. Your doctor can suggest a monitor. You can buy one at a drugstore or online. When choosing one: ? Choose one with an arm cuff. ? Choose one that wraps around your upper arm. Only one finger should fit between your arm and the cuff. ? Do not choose one that measures your blood pressure from your wrist or finger. Where to find more information American Heart Association: www.heart.org Contact a doctor if:  Your blood pressure keeps being high. Get help right away if:  Your first blood pressure number is higher than 180.  Your second blood pressure number is higher than 120. Summary  Check your blood pressure at the same time every day.  Avoid caffeine, alcohol, smoking, and exercise for 30 minutes before checking your blood pressure.  Make sure you understand what your blood pressure numbers should be. This information is not intended to replace advice given to you by your health care provider. Make sure you discuss any questions you have with your health care provider. Document Revised: 06/28/2019 Document Reviewed: 06/28/2019 Elsevier Patient Education  2021 [employer].

## 2020-12-03 ENCOUNTER — Other Ambulatory Visit: Payer: Self-pay

## 2020-12-03 ENCOUNTER — Encounter: Payer: Self-pay | Admitting: Adult Health

## 2020-12-03 ENCOUNTER — Ambulatory Visit (INDEPENDENT_AMBULATORY_CARE_PROVIDER_SITE_OTHER): Payer: PPO

## 2020-12-03 DIAGNOSIS — Z0000 Encounter for general adult medical examination without abnormal findings: Secondary | ICD-10-CM | POA: Diagnosis not present

## 2020-12-03 DIAGNOSIS — Z1231 Encounter for screening mammogram for malignant neoplasm of breast: Secondary | ICD-10-CM

## 2020-12-03 NOTE — Progress Notes (Signed)
Subjective:   Gabrielle Vargas is a 70 y.o. female who presents for an Initial Medicare Annual Wellness Visit.  I connected with Darius Bump today by telephone and verified that I am speaking with the correct person using two identifiers. Location patient: home Location provider: work Persons participating in the virtual visit: patient, provider.   I discussed the limitations, risks, security and privacy concerns of performing an evaluation and management service by telephone and the availability of in person appointments. I also discussed with the patient that there may be a patient responsible charge related to this service. The patient expressed understanding and verbally consented to this telephonic visit.    Interactive audio and video telecommunications were attempted between this provider and patient, however failed, due to patient having technical difficulties OR patient did not have access to video capability.  We continued and completed visit with audio only.    Review of Systems    n/a Cardiac Risk Factors include: advanced age (>85men, >45 women);dyslipidemia;hypertension     Objective:    Today's Vitals   There is no height or weight on file to calculate BMI.  Advanced Directives 12/03/2020 10/16/2017  Does Patient Have a Medical Advance Directive? Yes Yes  Type of Paramedic of [place];Living will Living will;Healthcare Power of Attorney  Does patient want to make changes to medical advance directive? No - Patient declined No - Patient declined  Copy of [place] in Chart? No - copy requested No - copy requested  Would patient like information on creating a medical advance directive? - No - Patient declined    Current Medications (verified) Outpatient Encounter Medications as of 12/03/2020  Medication Sig  . amphetamine-dextroamphetamine (ADDERALL) 20 MG tablet Take 1 tablet (20 mg total) by mouth 2 (two) times  daily.  Marland Kitchen amphetamine-dextroamphetamine (ADDERALL) 20 MG tablet Take 1 tablet (20 mg total) by mouth 2 (two) times daily.  Marland Kitchen amphetamine-dextroamphetamine (ADDERALL) 20 MG tablet Take 1 tablet (20 mg total) by mouth 2 (two) times daily.  . hydrochlorothiazide (HYDRODIURIL) 25 MG tablet TAKE 1 TABLET(25 MG) BY MOUTH DAILY  . ibuprofen (ADVIL,MOTRIN) 200 MG tablet Take 200 mg by mouth every 6 (six) hours as needed.  . simvastatin (ZOCOR) 20 MG tablet TAKE 1 TABLET(20 MG) BY MOUTH EVERY OTHER DAY  . SYNTHROID 75 MCG tablet TAKE 1 TABLET(75 MCG) BY MOUTH DAILY  . vitamin B-12 (CYANOCOBALAMIN) 1000 MCG tablet Take 1,000 mcg by mouth 2 (two) times a week.   No facility-administered encounter medications on file as of 12/03/2020.    Allergies (verified) Latex and Oxycodone-acetaminophen   History: Past Medical History:  Diagnosis Date  . Arthritis    Osteo  . Asthma    Seasonal  . Diverticula, colon   . Hyperlipidemia   . Hypertension   . Hypothyroidism    Past Surgical History:  Procedure Laterality Date  . ABDOMINAL HYSTERECTOMY    . BREAST SURGERY    . CARPAL TUNNEL RELEASE Bilateral   . CERVICAL DISC ARTHROPLASTY    . CESAREAN SECTION     x2  . left shoulder surgery    . MENISCUS REPAIR Right 2017  . REDUCTION MAMMAPLASTY Bilateral   . right shoulder    . TONSILLECTOMY     Family History  Problem Relation Age of Onset  . Kidney disease Father   . Diabetes Father   . Heart disease Father   . Melanoma Mother   . Colon cancer Neg  Hx   . Esophageal cancer Neg Hx   . Rectal cancer Neg Hx   . Stomach cancer Neg Hx    Social History   Socioeconomic History  . Marital status: Married    Spouse name: Not on file  . Number of children: 2  . Years of education: Not on file  . Highest education level: Not on file  Occupational History  . Occupation: Retired  Tobacco Use  . Smoking status: Never Smoker  . Smokeless tobacco: Never Used  Substance and Sexual Activity  .  Alcohol use: No  . Drug use: No  . Sexual activity: Not on file  Other Topics Concern  . Not on file  Social History Narrative   Retired   Married      Social Determinants of Radio broadcast assistant Strain: [place]   . Difficulty of Paying Living Expenses: Not hard at all  Food Insecurity: No Food Insecurity  . Worried About Charity fundraiser in the Last Year: Never true  . Ran Out of Food in the Last Year: Never true  Transportation Needs: No Transportation Needs  . Lack of Transportation (Medical): No  . Lack of Transportation (Non-Medical): No  Physical Activity: Sufficiently Active  . Days of Exercise per Week: 5 days  . Minutes of Exercise per Session: 80 min  Stress: No Stress Concern Present  . Feeling of Stress : Not at all  Social Connections: Moderately Integrated  . Frequency of Communication with Friends and Family: Three times a week  . Frequency of Social Gatherings with Friends and Family: Three times a week  . Attends Religious Services: More than 4 times per year  . Active Member of Clubs or Organizations: No  . Attends Archivist Meetings: Never  . Marital Status: Married    Tobacco Counseling Counseling given: Not Answered   Clinical Intake:  Pre-visit preparation completed: Yes  Pain : No/denies pain     Nutritional Risks: None Diabetes: No  How often do you need to have someone help you when you read instructions, pamphlets, or other written materials from your doctor or pharmacy?: 1 - Never What is the last grade level you completed in school?: college  Diabetic?no  Interpreter Needed?: No  Information entered by :: [place] of Daily Living In your present state of health, do you have any difficulty performing the following activities: 12/03/2020  Hearing? N  Vision? N  Difficulty concentrating or making decisions? N  Walking or climbing stairs? N  Dressing or bathing? N  Doing errands, shopping? N   Preparing Food and eating ? N  Using the Toilet? N  In the past six months, have you accidently leaked urine? N  Do you have problems with loss of bowel control? N  Managing your Medications? N  Managing your Finances? N  Housekeeping or managing your Housekeeping? N  Some recent data might be hidden    Patient Care Team: [name], NP as PCP - General (Family Medicine) Viona Gilmore, [facility] as Pharmacist (Pharmacist)  Indicate any recent Medical Services you may have received from other than [facility] providers in the past year (date may be approximate).     Assessment:   This is a routine wellness examination for [place].  Hearing/Vision screen  Hearing Screening   125Hz  250Hz  500Hz  1000Hz  2000Hz  3000Hz  4000Hz  6000Hz  8000Hz   Right ear:           Left ear:  Vision Screening Comments: Annual eye exam wear glasses   Dietary issues and exercise activities discussed: Current Exercise Habits: Home exercise routine, Type of exercise: walking, Time (Minutes): 60, Frequency (Times/Week): 5, Weekly Exercise (Minutes/Week): 300, Intensity: Mild, Exercise limited by: None identified  Goals Addressed            This Visit's Progress   . Exercise 3x per week (30 min per time)        Depression Screen PHQ 2/9 Scores 12/03/2020 02/05/2020 10/09/2017 09/19/2016 08/18/2015 06/26/2014  PHQ - 2 Score 0 0 0 0 0 0    Fall Risk Fall Risk  12/03/2020 02/05/2020 10/09/2017 09/19/2016 08/18/2015  Falls in the past year? 0 0 No No No  Number falls in past yr: 0 - - - -  Injury with Fall? 0 - - - -  Follow up Falls evaluation completed - - - -    FALL RISK PREVENTION PERTAINING TO THE HOME:  Any stairs in or around the home? Yes  If so, are there any without handrails? No  Home free of loose throw rugs in walkways, pet beds, electrical cords, etc? Yes  Adequate lighting in your home to reduce risk of falls? Yes   ASSISTIVE DEVICES UTILIZED TO PREVENT FALLS:  Life alert? No  Use of  a cane, walker or w/c? Yes  Grab bars in the bathroom? No  Shower chair or bench in shower? Yes  Elevated toilet seat or a handicapped toilet? No    Cognitive Function:       Normal cognitive status assessed by direct observation by this Nurse Health Advisor. No abnormalities found.    Immunizations Immunization History  Administered Date(s) Administered  . Influenza Split 05/08/2012, 04/01/2013  . Influenza Whole 05/09/2007, 04/22/2008, 04/17/2009, 04/19/2010  . Influenza, High Dose Seasonal PF 04/29/2017, 04/19/2018, 04/17/2020  . Influenza-Unspecified 03/19/2014, 03/31/2015  . Pneumococcal Conjugate-13 10/16/2013  . Pneumococcal Polysaccharide-23 08/18/2015  . Td 01/03/2005  . Tdap 01/27/2016  . Zoster 03/31/2015    TDAP status: Up to date  Flu Vaccine status: Up to date  Pneumococcal vaccine status: Up to date  Covid-19 vaccine status: Declined, Education has been provided regarding the importance of this vaccine but patient still declined. Advised may receive this vaccine at local pharmacy or Health Dept.or vaccine clinic. Aware to provide a copy of the vaccination record if obtained from local pharmacy or Health Dept. Verbalized acceptance and understanding.  Qualifies for Shingles Vaccine? Yes   Zostavax completed Yes   Shingrix Completed?: No.    Education has been provided regarding the importance of this vaccine. Patient has been advised to call insurance company to determine out of pocket expense if they have not yet received this vaccine. Advised may also receive vaccine at local pharmacy or Health Dept. Verbalized acceptance and understanding.  Screening Tests Health Maintenance  Topic Date Due  . COVID-19 Vaccine (1) Never done  . MAMMOGRAM  01/11/2020  . INFLUENZA VACCINE  02/15/2021  . COLONOSCOPY (Pts 45-13yrs Insurance coverage will need to be confirmed)  10/12/2023  . TETANUS/TDAP  01/26/2026  . DEXA SCAN  Completed  . Hepatitis C Screening  Completed   . PNA vac Low Risk Adult  Completed  . HPV VACCINES  Aged Out    Health Maintenance  Health Maintenance Due  Topic Date Due  . COVID-19 Vaccine (1) Never done  . MAMMOGRAM  01/11/2020    Colorectal cancer screening: Type of screening: Colonoscopy. Completed 10/11/2013. Repeat every 10 years  Mammogram status: Ordered 12/03/2020. Pt provided with contact info and advised to call to schedule appt.   Bone Density status: Completed 02/12/2019. Results reflect: Bone density results: OSTEOPENIA. Repeat every 3 years.  Lung Cancer Screening: (Low Dose CT Chest recommended if Age 57-80 years, 30 pack-year currently smoking OR have quit w/in 15years.) does not qualify.   Lung Cancer Screening Referral: n/a   Additional Screening:  Hepatitis C Screening: does qualify; Completed 02/05/2020   Vision Screening: Recommended annual ophthalmology exams for early detection of glaucoma and other disorders of the eye. Is the patient up to date with their annual eye exam?  Yes  Who is the provider or what is the name of the office in which the patient attends annual eye exams? Dr.Jones  If pt is not established with a provider, would they like to be referred to a provider to establish care? No .   Dental Screening: Recommended annual dental exams for proper oral hygiene  Community Resource Referral / Chronic Care Management: CRR required this visit?  No   CCM required this visit?  No      Plan:     I have personally reviewed and noted the following in the patient's chart:   . Medical and social history . Use of alcohol, tobacco or illicit drugs  . Current medications and supplements including opioid prescriptions. Patient is not currently taking opioid prescriptions. . Functional ability and status . Nutritional status . Physical activity . Advanced directives . List of other physicians . Hospitalizations, surgeries, and ER visits in previous 12 months . Vitals . Screenings to  include cognitive, depression, and falls . Referrals and appointments  In addition, I have reviewed and discussed with patient certain preventive protocols, quality metrics, and best practice recommendations. A written personalized care plan for preventive services as well as general preventive health recommendations were provided to patient.     Randel Pigg, LPN   01/25/6268   Nurse Notes: none

## 2020-12-03 NOTE — Patient Instructions (Signed)
Gabrielle Vargas , Thank you for taking time to come for your Medicare Wellness Visit. I appreciate your ongoing commitment to your health goals. Please review the following plan we discussed and let me know if I can assist you in the future.   Screening recommendations/referrals: Colonoscopy: Current due 10/12/2023 Mammogram: referral placed 12/04/2070 Bone Density: 02/12/2019  Due 2023 Recommended yearly ophthalmology/optometry visit for glaucoma screening and checkup Recommended yearly dental visit for hygiene and checkup  Vaccinations: Influenza vaccine: current due fall 2072 Pneumococcal vaccine: completed series  Tdap vaccine: 01/27/2016  Due 2027 Shingles vaccine: Will obtain local pharmacy   Advanced directives: Will provide copies   Conditions/risks identified: none   Next appointment: CPE  02/11/2071  @900am   Gabrielle Vargas   Preventive Care 72 Years and Older, Female Preventive care refers to lifestyle choices and visits with your health care provider that can promote health and wellness. What does preventive care include?  A yearly physical exam. This is also called an annual well check.  Dental exams once or twice a year.  Routine eye exams. Ask your health care provider how often you should have your eyes checked.  Personal lifestyle choices, including:  Daily care of your teeth and gums.  Regular physical activity.  Eating a healthy diet.  Avoiding tobacco and drug use.  Limiting alcohol use.  Practicing safe sex.  Taking low-dose aspirin every day.  Taking vitamin and mineral supplements as recommended by your health care provider. What happens during an annual well check? The services and screenings done by your health care provider during your annual well check will depend on your age, overall health, lifestyle risk factors, and family history of disease. Counseling  Your health care provider may ask you questions about your:  Alcohol  use.  Tobacco use.  Drug use.  Emotional well-being.  Home and relationship well-being.  Sexual activity.  Eating habits.  History of falls.  Memory and ability to understand (cognition).  Work and work Statistician.  Reproductive health. Screening  You may have the following tests or measurements:  Height, weight, and BMI.  Blood pressure.  Lipid and cholesterol levels. These may be checked every 5 years, or more frequently if you are over 69 years old.  Skin check.  Lung cancer screening. You may have this screening every year starting at age 29 if you have a 30-pack-year history of smoking and currently smoke or have quit within the past 15 years.  Fecal occult blood test (FOBT) of the stool. You may have this test every year starting at age 63.  Flexible sigmoidoscopy or colonoscopy. You may have a sigmoidoscopy every 5 years or a colonoscopy every 10 years starting at age 75.  Hepatitis C blood test.  Hepatitis B blood test.  Sexually transmitted disease (STD) testing.  Diabetes screening. This is done by checking your blood sugar (glucose) after you have not eaten for a while (fasting). You may have this done every 1-3 years.  Bone density scan. This is done to screen for osteoporosis. You may have this done starting at age 19.  Mammogram. This may be done every 1-2 years. Talk to your health care provider about how often you should have regular mammograms. Talk with your health care provider about your test results, treatment options, and if necessary, the need for more tests. Vaccines  Your health care provider may recommend certain vaccines, such as:  Influenza vaccine. This is recommended every year.  Tetanus, diphtheria, and acellular pertussis (Tdap,  Td) vaccine. You may need a Td booster every 10 years.  Zoster vaccine. You may need this after age 63.  Pneumococcal 13-valent conjugate (PCV13) vaccine. One dose is recommended after age  25.  Pneumococcal polysaccharide (PPSV23) vaccine. One dose is recommended after age 35. Talk to your health care provider about which screenings and vaccines you need and how often you need them. This information is not intended to replace advice given to you by your health care provider. Make sure you discuss any questions you have with your health care provider. Document Released: 07/31/2015 Document Revised: 03/23/2016 Document Reviewed: 05/05/2015 Elsevier Interactive Patient Education  2017 [place] Prevention in the Home Falls can cause injuries. They can happen to people of all ages. There are many things you can do to make your home safe and to help prevent falls. What can I do on the outside of my home?  Regularly fix the edges of walkways and driveways and fix any cracks.  Remove anything that might make you trip as you walk through a door, such as a raised step or threshold.  Trim any bushes or trees on the path to your home.  Use bright outdoor lighting.  Clear any walking paths of anything that might make someone trip, such as rocks or tools.  Regularly check to see if handrails are loose or broken. Make sure that both sides of any steps have handrails.  Any raised decks and porches should have guardrails on the edges.  Have any leaves, snow, or ice cleared regularly.  Use sand or salt on walking paths during winter.  Clean up any spills in your garage right away. This includes oil or grease spills. What can I do in the bathroom?  Use night lights.  Install grab bars by the toilet and in the tub and shower. Do not use towel bars as grab bars.  Use non-skid mats or decals in the tub or shower.  If you need to sit down in the shower, use a plastic, non-slip stool.  Keep the floor dry. Clean up any water that spills on the floor as soon as it happens.  Remove soap buildup in the tub or shower regularly.  Attach bath mats securely with double-sided  non-slip rug tape.  Do not have throw rugs and other things on the floor that can make you trip. What can I do in the bedroom?  Use night lights.  Make sure that you have a light by your bed that is easy to reach.  Do not use any sheets or blankets that are too big for your bed. They should not hang down onto the floor.  Have a firm chair that has side arms. You can use this for support while you get dressed.  Do not have throw rugs and other things on the floor that can make you trip. What can I do in the kitchen?  Clean up any spills right away.  Avoid walking on wet floors.  Keep items that you use a lot in easy-to-reach places.  If you need to reach something above you, use a strong step stool that has a grab bar.  Keep electrical cords out of the way.  Do not use floor polish or wax that makes floors slippery. If you must use wax, use non-skid floor wax.  Do not have throw rugs and other things on the floor that can make you trip. What can I do with my stairs?  Do not  leave any items on the stairs.  Make sure that there are handrails on both sides of the stairs and use them. Fix handrails that are broken or loose. Make sure that handrails are as long as the stairways.  Check any carpeting to make sure that it is firmly attached to the stairs. Fix any carpet that is loose or worn.  Avoid having throw rugs at the top or bottom of the stairs. If you do have throw rugs, attach them to the floor with carpet tape.  Make sure that you have a light switch at the top of the stairs and the bottom of the stairs. If you do not have them, ask someone to add them for you. What else can I do to help prevent falls?  Wear shoes that:  Do not have high heels.  Have rubber bottoms.  Are comfortable and fit you well.  Are closed at the toe. Do not wear sandals.  If you use a stepladder:  Make sure that it is fully opened. Do not climb a closed stepladder.  Make sure that both  sides of the stepladder are locked into place.  Ask someone to hold it for you, if possible.  Clearly mark and make sure that you can see:  Any grab bars or handrails.  First and last steps.  Where the edge of each step is.  Use tools that help you move around (mobility aids) if they are needed. These include:  Canes.  Walkers.  Scooters.  Crutches.  Turn on the lights when you go into a dark area. Replace any light bulbs as soon as they burn out.  Set up your furniture so you have a clear path. Avoid moving your furniture around.  If any of your floors are uneven, fix them.  If there are any pets around you, be aware of where they are.  Review your medicines with your doctor. Some medicines can make you feel dizzy. This can increase your chance of falling. Ask your doctor what other things that you can do to help prevent falls. This information is not intended to replace advice given to you by your health care provider. Make sure you discuss any questions you have with your health care provider. Document Released: 04/30/2009 Document Revised: 12/10/2015 Document Reviewed: 08/08/2014 Elsevier Interactive Patient Education  2017 [employer].

## 2020-12-08 ENCOUNTER — Telehealth: Payer: Self-pay | Admitting: Adult Health

## 2020-12-08 NOTE — Telephone Encounter (Signed)
Pt call and stated she test positive for covid and want to know what Tommi Rumps think she need to do and want a call back.

## 2021-01-26 ENCOUNTER — Telehealth: Payer: Self-pay | Admitting: Pharmacist

## 2021-01-26 NOTE — Chronic Care Management (AMB) (Signed)
Chronic Care Management Pharmacy Assistant   Name: Gabrielle Vargas  MRN: [id number] DOB: 03/06/1951  Reason for Encounter: Disease State   Conditions to be addressed/monitored: HTN  Recent office visits:  05.19.2022 Randel Pigg LPN Medicare Wellness exam  Recent consult visits:  None  Hospital visits:  None in previous 6 months  Medications: Outpatient Encounter Medications as of 01/26/2021  Medication Sig   amphetamine-dextroamphetamine (ADDERALL) 20 MG tablet Take 1 tablet (20 mg total) by mouth 2 (two) times daily.   amphetamine-dextroamphetamine (ADDERALL) 20 MG tablet Take 1 tablet (20 mg total) by mouth 2 (two) times daily.   amphetamine-dextroamphetamine (ADDERALL) 20 MG tablet Take 1 tablet (20 mg total) by mouth 2 (two) times daily.   hydrochlorothiazide (HYDRODIURIL) 25 MG tablet TAKE 1 TABLET(25 MG) BY MOUTH DAILY   ibuprofen (ADVIL,MOTRIN) 200 MG tablet Take 200 mg by mouth every 6 (six) hours as needed.   simvastatin (ZOCOR) 20 MG tablet TAKE 1 TABLET(20 MG) BY MOUTH EVERY OTHER DAY   SYNTHROID 75 MCG tablet TAKE 1 TABLET(75 MCG) BY MOUTH DAILY   vitamin B-12 (CYANOCOBALAMIN) 1000 MCG tablet Take 1,000 mcg by mouth 2 (two) times a week.   No facility-administered encounter medications on file as of 01/26/2021.   Reviewed chart prior to disease state call. Spoke with patient regarding BP  Recent Office Vitals: BP Readings from Last 3 Encounters:  07/22/20 136/90  02/05/20 140/86  08/07/19 140/82   Pulse Readings from Last 3 Encounters:  08/07/19 (!) 104  10/30/17 95  10/09/17 84    Wt Readings from Last 3 Encounters:  07/22/20 119 lb (54 kg)  02/05/20 117 lb (53.1 kg)  08/07/19 122 lb (55.3 kg)     Kidney Function Lab Results  Component Value Date/Time   CREATININE 0.80 02/05/2020 08:57 AM   CREATININE 0.70 01/31/2019 10:08 AM   CREATININE 0.62 10/09/2017 10:17 AM   GFR 82.65 01/31/2019 10:08 AM   GFRNONAA 74 02/05/2020 08:57 AM   GFRAA  86 02/05/2020 08:57 AM    BMP Latest Ref Rng & Units 02/05/2020 01/31/2019 10/09/2017  Glucose 65 - 99 mg/dL 102(H) 102(H) 100(H)  BUN 7 - 25 mg/dL 16 18 18   Creatinine 0.60 - 0.93 mg/dL 0.80 0.70 0.62  BUN/Creat Ratio 6 - 22 (calc) NOT APPLICABLE - -  Sodium 938 - 146 mmol/L 139 140 139  Potassium 3.5 - 5.3 mmol/L 3.8 3.5 3.4(L)  Chloride 98 - 110 mmol/L 99 99 100  CO2 20 - 32 mmol/L 29 31 30   Calcium 8.6 - 10.4 mg/dL 10.0 9.6 9.8   Current antihypertensive regimen:  HCTZ 25 mg, 1 tablet once daily (patient takes in the AM)  How often are you checking your Blood Pressure? when feeling symptomatic Current home BP readings: No current reading What recent interventions/DTPs have been made by any provider to improve Blood Pressure control since last CPP Visit: None Any recent hospitalizations or ED visits since last visit with CPP? No What diet changes have been made to improve Blood Pressure Control?  No Change What exercise is being done to improve your Blood Pressure Control?  Walks 1-2 hours 3-5 times a week  Adherence Review: Is the patient currently on ACE/ARB medication? No Does the patient have >5 day gap between last estimated fill dates? No  I spoke with the patient about medication adherence. She states that she has been doing well. She only takes her blood pressure when she is feeling symptomatic. She states that  she has no side effects from her current medications. There have been no changes in her diet. She continues to walk for one to two hours at least three to five times a week. There have been no current changes in her medications. She is due to see her PCP later this month and has a 3D mammogram. there have been no recent falls or injuries. There have been no urgent care hospital or emergency department visits since her last PCP or CPP visit. She currently uses [facility] on [address] and [address]. She does not have any issues with her pharmacy. Her next CCM will  appointment is scheduled for October 2022.  Care Gaps: COVID 19 vaccine Zoster Vaccines Mammogram   Star Rating Drugs: Medication Dispensed Quantity Pharmacy  Simvastatin 20 mg [phone] [place] ([place]) Mare Ferrari, [place] Pharmacist Assistant [phone]

## 2021-02-05 ENCOUNTER — Ambulatory Visit
Admission: RE | Admit: 2021-02-05 | Discharge: 2021-02-05 | Disposition: A | Discharge status: Home / Self Care | Payer: PPO | Source: Ambulatory Visit | Attending: Adult Health | Admitting: Adult Health

## 2021-02-05 ENCOUNTER — Other Ambulatory Visit: Payer: Self-pay

## 2021-02-05 DIAGNOSIS — Z1231 Encounter for screening mammogram for malignant neoplasm of breast: Secondary | ICD-10-CM | POA: Diagnosis not present

## 2021-02-09 ENCOUNTER — Other Ambulatory Visit: Payer: Self-pay

## 2021-02-10 ENCOUNTER — Ambulatory Visit (INDEPENDENT_AMBULATORY_CARE_PROVIDER_SITE_OTHER): Payer: PPO | Admitting: Adult Health

## 2021-02-10 ENCOUNTER — Encounter: Payer: Self-pay | Admitting: Adult Health

## 2021-02-10 VITALS — BP 128/86 | HR 80 | Temp 98.1°F | Ht 59.0 in | Wt 115.0 lb

## 2021-02-10 DIAGNOSIS — E782 Mixed hyperlipidemia: Secondary | ICD-10-CM | POA: Diagnosis not present

## 2021-02-10 DIAGNOSIS — Z0000 Encounter for general adult medical examination without abnormal findings: Secondary | ICD-10-CM

## 2021-02-10 DIAGNOSIS — M4802 Spinal stenosis, cervical region: Secondary | ICD-10-CM

## 2021-02-10 DIAGNOSIS — I10 Essential (primary) hypertension: Secondary | ICD-10-CM | POA: Diagnosis not present

## 2021-02-10 DIAGNOSIS — Z23 Encounter for immunization: Secondary | ICD-10-CM

## 2021-02-10 DIAGNOSIS — F909 Attention-deficit hyperactivity disorder, unspecified type: Secondary | ICD-10-CM

## 2021-02-10 DIAGNOSIS — E039 Hypothyroidism, unspecified: Secondary | ICD-10-CM

## 2021-02-10 DIAGNOSIS — E538 Deficiency of other specified B group vitamins: Secondary | ICD-10-CM | POA: Diagnosis not present

## 2021-02-10 LAB — COMPREHENSIVE METABOLIC PANEL
ALT: 16 U/L (ref 0–35)
AST: 16 U/L (ref 0–37)
Albumin: 4.6 g/dL (ref 3.5–5.2)
Alkaline Phosphatase: 100 U/L (ref 39–117)
BUN: 20 mg/dL (ref 6–23)
CO2: 33 mEq/L — ABNORMAL HIGH (ref 19–32)
Calcium: 9.9 mg/dL (ref 8.4–10.5)
Chloride: 98 mEq/L (ref 96–112)
Creatinine, Ser: 0.72 mg/dL (ref 0.40–1.20)
GFR: 83.57 mL/min (ref >60.00)
Glucose, Bld: 103 mg/dL — ABNORMAL HIGH (ref 70–99)
Potassium: 3.8 mEq/L (ref 3.5–5.1)
Sodium: 139 mEq/L (ref 135–145)
Total Bilirubin: 0.5 mg/dL (ref 0.2–1.2)
Total Protein: 7 g/dL (ref 6.0–8.3)

## 2021-02-10 LAB — CBC WITH DIFFERENTIAL/PLATELET
Basophils Absolute: 0 10*3/uL (ref 0.0–0.1)
Basophils Relative: 0.5 % (ref 0.0–3.0)
Eosinophils Absolute: 0 10*3/uL (ref 0.0–0.7)
Eosinophils Relative: 0.7 % (ref 0.0–5.0)
HCT: 41.4 % (ref 36.0–46.0)
Hemoglobin: 14.1 g/dL (ref 12.0–15.0)
Lymphocytes Relative: 28.4 % (ref 12.0–46.0)
Lymphs Abs: 1.9 10*3/uL (ref 0.7–4.0)
MCHC: 34.1 g/dL (ref 30.0–36.0)
MCV: 85.9 fl (ref 78.0–100.0)
Monocytes Absolute: 0.3 10*3/uL (ref 0.1–1.0)
Monocytes Relative: 4.6 % (ref 3.0–12.0)
Neutro Abs: 4.4 10*3/uL (ref 1.4–7.7)
Neutrophils Relative %: 65.8 % (ref 43.0–77.0)
Platelets: 221 10*3/uL (ref 150.0–400.0)
RBC: 4.82 Mil/uL (ref 3.87–5.11)
RDW: 13.1 % (ref 11.5–15.5)
WBC: 6.6 10*3/uL (ref 4.0–10.5)

## 2021-02-10 LAB — LIPID PANEL
Cholesterol: 201 mg/dL — ABNORMAL HIGH (ref 0–200)
HDL: 61.1 mg/dL (ref >39.00)
LDL Cholesterol: 107 mg/dL — ABNORMAL HIGH (ref 0–99)
NonHDL: 139.9
Total CHOL/HDL Ratio: 3
Triglycerides: 164 mg/dL — ABNORMAL HIGH (ref 0.0–149.0)
VLDL: 32.8 mg/dL (ref 0.0–40.0)

## 2021-02-10 LAB — VITAMIN B12: Vitamin B-12: 375 pg/mL (ref 211–911)

## 2021-02-10 LAB — TSH: TSH: 1.03 u[IU]/mL (ref 0.35–5.50)

## 2021-02-10 MED ORDER — AMPHETAMINE-DEXTROAMPHETAMINE 20 MG PO TABS: 20.0000 mg | ORAL_TABLET | Freq: Two times a day (BID) | ORAL | 0 refills | Status: DC

## 2021-02-10 NOTE — Addendum Note (Signed)
Addended by: Gwenyth Ober R on: 02/10/2021 11:23 AM   Modules accepted: Orders

## 2021-02-10 NOTE — Addendum Note (Signed)
Addended by: Elmer Picker on: 02/10/2021 09:44 AM   Modules accepted: Orders

## 2021-02-10 NOTE — Patient Instructions (Addendum)
It was great seeing you today   We will follow up with you regarding your blood work   Please follow up in 2 months for your second shingles vaccination   I will see you back in 6 months for ADHD follow up

## 2021-02-10 NOTE — Progress Notes (Signed)
Subjective:    Patient ID: Gabrielle Vargas, female    DOB: 24-Jan-1951, 70 y.o.   MRN: [id number]  HPI Patient presents for yearly preventative medicine examination. He is a pleasant 70 year old female who  has a past medical history of Arthritis, Asthma, Diverticula, colon, Hyperlipidemia, Hypertension, and Hypothyroidism.  Hypertension -prescribed HCTZ 25 mg daily.  She denies dizziness, lightheadedness, chest pain, shortness of breath BP Readings from Last 3 Encounters:  02/10/21 128/86  07/22/20 136/90  02/05/20 140/86   Hyperlipidemia -describes simvastatin 20 mg every other day.  She does not note any side effects with every other day dosing. Lab Results  Component Value Date   CHOL 204 (H) 02/05/2020   HDL 60 02/05/2020   LDLCALC 110 (H) 02/05/2020   LDLDIRECT 158.7 06/19/2014   TRIG 216 (H) 02/05/2020   CHOLHDL 3.4 02/05/2020    Hypothyroidism -prescribed Synthroid 75 mcg daily.  She does feel well controlled on this medication Lab Results  Component Value Date   TSH 1.60 02/05/2020   ADHD -takes Adderall 20 mg twice daily.  She does not take this all the time during the weekend as she does not need to focus as much.  She does feel well controlled during the week when she does take it  Vitamin B 12 deficiency takes oral vitamin B12 twice a week.   H/o of cervical disc arthroplasty in 2006. Reports that over the last few months she has been experiencing increased discomfort. The discomfort is not debilitating but enough for her know recognize. No loss of ROM past baseline.   All immunizations and health maintenance protocols were reviewed with the patient and needed orders were placed. Refuses COVID vaccination but will do shingles vaccination   Appropriate screening laboratory values were ordered for the patient including screening of hyperlipidemia, renal function and hepatic function.  Medication reconciliation,  past medical history, social history, problem  list and allergies were reviewed in detail with the patient  Goals were established with regard to weight loss, exercise, and  diet in compliance with medications. She is staying active and golf and walking and eat a healthy diet.  Wt Readings from Last 3 Encounters:  02/10/21 115 lb (52.2 kg)  07/22/20 119 lb (54 kg)  02/05/20 117 lb (53.1 kg)   She is up to date on routine colon cancer screening and mammogram.   Review of Systems  Constitutional: Negative.   HENT: Negative.    Eyes: Negative.   Respiratory: Negative.    Cardiovascular: Negative.   Gastrointestinal: Negative.   Endocrine: Negative.   Genitourinary: Negative.   Musculoskeletal:  Positive for arthralgias and neck pain.  Skin: Negative.   Allergic/Immunologic: Negative.   Neurological: Negative.   Hematological: Negative.   Psychiatric/Behavioral: Negative.    Past Medical History:  Diagnosis Date   Arthritis    Osteo   Asthma    Seasonal   Diverticula, colon    Hyperlipidemia    Hypertension    Hypothyroidism     Social History   Socioeconomic History   Marital status: Married    Spouse name: Not on file   Number of children: 2   Years of education: Not on file   Highest education level: Not on file  Occupational History   Occupation: Retired  Tobacco Use   Smoking status: Never   Smokeless tobacco: Never  Substance and Sexual Activity   Alcohol use: No   Drug use: No   Sexual activity: Not  on file  Other Topics Concern   Not on file  Social History Narrative   Retired   Married      Social Determinants of Radio broadcast assistant Strain: Low Risk    Difficulty of Paying Living Expenses: Not hard at all  Food Insecurity: No Food Insecurity   Worried About Charity fundraiser in the Last Year: Never true   Arboriculturist in the Last Year: Never true  Transportation Needs: No Transportation Needs   Lack of Transportation (Medical): No   Lack of Transportation (Non-Medical): No   Physical Activity: Sufficiently Active   Days of Exercise per Week: 5 days   Minutes of Exercise per Session: 80 min  Stress: No Stress Concern Present   Feeling of Stress : Not at all  Social Connections: Moderately Integrated   Frequency of Communication with Friends and Family: Three times a week   Frequency of Social Gatherings with Friends and Family: Three times a week   Attends Religious Services: More than 4 times per year   Active Member of Clubs or Organizations: No   Attends Archivist Meetings: Never   Marital Status: Married  Human resources officer Violence: Not At Risk   Fear of Current or Ex-Partner: No   Emotionally Abused: No   Physically Abused: No   Sexually Abused: No    Past Surgical History:  Procedure Laterality Date   ABDOMINAL HYSTERECTOMY     BREAST SURGERY     CARPAL TUNNEL RELEASE Bilateral    CERVICAL [place]     x2   left shoulder surgery     MENISCUS REPAIR Right 2017   REDUCTION MAMMAPLASTY Bilateral    right shoulder     TONSILLECTOMY      Family History  Problem Relation Age of Onset   Kidney disease Father    Diabetes Father    Heart disease Father    Melanoma Mother    Colon cancer Neg Hx    Esophageal cancer Neg Hx    Rectal cancer Neg Hx    Stomach cancer Neg Hx     Allergies  Allergen Reactions   Latex     REACTION: rash/hives   Oxycodone-Acetaminophen     REACTION: nausea    Current Outpatient Medications on File Prior to Visit  Medication Sig Dispense Refill   Acetylcysteine (NAC) 500 MG CAPS Take by mouth.     amphetamine-dextroamphetamine (ADDERALL) 20 MG tablet Take 1 tablet (20 mg total) by mouth 2 (two) times daily. 60 tablet 0   amphetamine-dextroamphetamine (ADDERALL) 20 MG tablet Take 1 tablet (20 mg total) by mouth 2 (two) times daily. 60 tablet 0   amphetamine-dextroamphetamine (ADDERALL) 20 MG tablet Take 1 tablet (20 mg total) by mouth 2 (two) times daily. 60 tablet 0    Ascorbic Acid (VITAMIN C) 1000 MG tablet Take 1,000 mg by mouth daily.     Cholecalciferol (D3-50 PO) Take by mouth.     hydrochlorothiazide (HYDRODIURIL) 25 MG tablet TAKE 1 TABLET(25 MG) BY MOUTH DAILY 90 tablet 3   ibuprofen (ADVIL,MOTRIN) 200 MG tablet Take 200 mg by mouth every 6 (six) hours as needed.     simvastatin (ZOCOR) 20 MG tablet TAKE 1 TABLET(20 MG) BY MOUTH EVERY OTHER DAY 45 tablet 3   SYNTHROID 75 MCG tablet TAKE 1 TABLET(75 MCG) BY MOUTH DAILY 90 tablet 3   vitamin B-12 (CYANOCOBALAMIN) 1000 MCG tablet Take 1,000  mcg by mouth 2 (two) times a week.     Zinc 20 MG CAPS Take by mouth.     No current facility-administered medications on file prior to visit.    BP 128/86   Pulse 80   Temp 98.1 F (36.7 C) (Oral)   Ht '4\' 11"'$  (1.499 m)   Wt 115 lb (52.2 kg)   SpO2 94%   BMI 23.23 kg/m        Objective:   Physical Exam Vitals and nursing note reviewed.  Constitutional:      General: She is not in acute distress.    Appearance: Normal appearance. She is well-developed. She is not ill-appearing.  HENT:     Head: Normocephalic and atraumatic.     Right Ear: Tympanic membrane, ear canal and external ear normal. There is no impacted cerumen.     Left Ear: Tympanic membrane, ear canal and external ear normal. There is no impacted cerumen.     Nose: Nose normal. No congestion or rhinorrhea.     Mouth/Throat:     Mouth: Mucous membranes are moist.     Pharynx: Oropharynx is clear. No oropharyngeal exudate or posterior oropharyngeal erythema.  Eyes:     General:        Right eye: No discharge.        Left eye: No discharge.     Extraocular Movements: Extraocular movements intact.     Conjunctiva/sclera: Conjunctivae normal.     Pupils: Pupils are equal, round, and reactive to light.  Neck:     Thyroid: No thyromegaly.     Vascular: No carotid bruit.     Trachea: No tracheal deviation.  Cardiovascular:     Rate and Rhythm: Normal rate and regular rhythm.      Pulses: Normal pulses.     Heart sounds: Normal heart sounds. No murmur heard.   No friction rub. No gallop.  Pulmonary:     Effort: Pulmonary effort is normal. No respiratory distress.     Breath sounds: Normal breath sounds. No stridor. No wheezing, rhonchi or rales.  Chest:     Chest wall: No tenderness.  Abdominal:     General: Abdomen is flat. Bowel sounds are normal. There is no distension.     Palpations: Abdomen is soft. There is no mass.     Tenderness: There is no abdominal tenderness. There is no right CVA tenderness, left CVA tenderness, guarding or rebound.     Hernia: No hernia is present.  Musculoskeletal:        General: No swelling, tenderness, deformity or signs of injury.     Cervical back: Neck supple. No tenderness or bony tenderness. Pain with movement present. Decreased range of motion (chronic).     Right lower leg: No edema.     Left lower leg: No edema.  Lymphadenopathy:     Cervical: No cervical adenopathy.  Skin:    General: Skin is warm and dry.     Coloration: Skin is not jaundiced or pale.     Findings: No bruising, erythema, lesion or rash.  Neurological:     General: No focal deficit present.     Mental Status: She is alert and oriented to person, place, and time.     Cranial Nerves: No cranial nerve deficit.     Sensory: No sensory deficit.     Motor: No weakness.     Coordination: Coordination normal.     Gait: Gait normal.  Deep Tendon Reflexes: Reflexes normal.  Psychiatric:        Mood and Affect: Mood normal.        Behavior: Behavior normal.        Thought Content: Thought content normal.        Judgment: Judgment normal.      Assessment & Plan:  1. Routine general medical examination at a health care facility - Continue to exercise and eat healthy  - Follow up in one year or sooner if needed - CBC with Differential/Platelet; Future - Comprehensive metabolic panel; Future - Lipid panel; Future - TSH; Future  2. Essential  hypertension - Well controlled. No change in medications - CBC with Differential/Platelet; Future - Comprehensive metabolic panel; Future - Lipid panel; Future - TSH; Future  3. Hypothyroidism, unspecified type - Consider dose increase - CBC with Differential/Platelet; Future - Comprehensive metabolic panel; Future - Lipid panel; Future - TSH; Future  4. Mixed hyperlipidemia - Continue with statin every other day  - CBC with Differential/Platelet; Future - Comprehensive metabolic panel; Future - Lipid panel; Future - TSH; Future  5. Attention deficit hyperactivity disorder (ADHD), unspecified ADHD type - Well controlled.  - FOllow up in 6 months  - CBC with Differential/Platelet; Future - Comprehensive metabolic panel; Future - Lipid panel; Future - TSH; Future - amphetamine-dextroamphetamine (ADDERALL) 20 MG tablet; Take 1 tablet (20 mg total) by mouth 2 (two) times daily.  Dispense: 60 tablet; Refill: 0 - amphetamine-dextroamphetamine (ADDERALL) 20 MG tablet; Take 1 tablet (20 mg total) by mouth 2 (two) times daily.  Dispense: 60 tablet; Refill: 0 - amphetamine-dextroamphetamine (ADDERALL) 20 MG tablet; Take 1 tablet (20 mg total) by mouth 2 (two) times daily.  Dispense: 60 tablet; Refill: 0  6. B12 deficiency  - Vitamin B12; Future  7. Cervical stenosis of spine  - DG Cervical Spine Complete; Future  [name], NP

## 2021-02-11 MED ORDER — EZETIMIBE 10 MG PO TABS: 10.0000 mg | ORAL_TABLET | Freq: Every day | ORAL | 3 refills | Status: DC

## 2021-02-11 NOTE — Addendum Note (Signed)
Addended by: Gwenyth Ober R on: 02/11/2021 05:14 PM   Modules accepted: Orders

## 2021-03-24 ENCOUNTER — Telehealth: Payer: Self-pay | Admitting: Pharmacist

## 2021-03-24 NOTE — Chronic Care Management (AMB) (Signed)
Chronic Care Management Pharmacy Assistant   Name: Gabrielle Vargas  MRN: [id number] DOB: 08-02-1950   Reason for Encounter: Disease State/ Hypertension Assessment Call   Conditions to be addressed/monitored: HTN   Recent office visits:  02-10-2021 Gabrielle Peng, NP - Patient presented for Routine general medical examination and other concerns. Prescribed ezetimibe (ZETIA) 10 MG.  Recent consult visits:  02-05-2021 [facility] - Patient presented for a [facility] visits:  None in previous 6 months  Medications: Outpatient Encounter Medications as of 03/24/2021  Medication Sig   Acetylcysteine (NAC) 500 MG CAPS Take by mouth.   amphetamine-dextroamphetamine (ADDERALL) 20 MG tablet Take 1 tablet (20 mg total) by mouth 2 (two) times daily.   amphetamine-dextroamphetamine (ADDERALL) 20 MG tablet Take 1 tablet (20 mg total) by mouth 2 (two) times daily.   amphetamine-dextroamphetamine (ADDERALL) 20 MG tablet Take 1 tablet (20 mg total) by mouth 2 (two) times daily.   Ascorbic Acid (VITAMIN C) 1000 MG tablet Take 1,000 mg by mouth daily.   Cholecalciferol (D3-50 PO) Take by mouth.   ezetimibe (ZETIA) 10 MG tablet Take 1 tablet (10 mg total) by mouth daily.   hydrochlorothiazide (HYDRODIURIL) 25 MG tablet TAKE 1 TABLET(25 MG) BY MOUTH DAILY   ibuprofen (ADVIL,MOTRIN) 200 MG tablet Take 200 mg by mouth every 6 (six) hours as needed.   simvastatin (ZOCOR) 20 MG tablet TAKE 1 TABLET(20 MG) BY MOUTH EVERY OTHER DAY   SYNTHROID 75 MCG tablet TAKE 1 TABLET(75 MCG) BY MOUTH DAILY   vitamin B-12 (CYANOCOBALAMIN) 1000 MCG tablet Take 1,000 mcg by mouth 2 (two) times a week.   Zinc 20 MG CAPS Take by mouth.   No facility-administered encounter medications on file as of 03/24/2021.  Reviewed chart prior to disease state call. Spoke with patient regarding BP  Recent Office Vitals: BP Readings from Last 3 Encounters:  02/10/21 128/86  07/22/20 136/90  02/05/20 140/86    Pulse Readings from Last 3 Encounters:  02/10/21 80  08/07/19 (!) 104  10/30/17 95    Wt Readings from Last 3 Encounters:  02/10/21 115 lb (52.2 kg)  07/22/20 119 lb (54 kg)  02/05/20 117 lb (53.1 kg)     Kidney Function Lab Results  Component Value Date/Time   CREATININE 0.72 02/10/2021 09:44 AM   CREATININE 0.80 02/05/2020 08:57 AM   CREATININE 0.70 01/31/2019 10:08 AM   GFR 83.57 02/10/2021 09:44 AM   GFRNONAA 74 02/05/2020 08:57 AM   GFRAA 86 02/05/2020 08:57 AM    BMP Latest Ref Rng & Units 02/10/2021 02/05/2020 01/31/2019  Glucose 70 - 99 mg/dL 103(H) 102(H) 102(H)  BUN 6 - 23 mg/dL '20 16 18  '$ Creatinine 0.40 - 1.20 mg/dL 0.72 0.80 0.70  BUN/Creat Ratio 6 - 22 (calc) - NOT APPLICABLE -  Sodium A999333 - 145 mEq/L 139 139 140  Potassium 3.5 - 5.1 mEq/L 3.8 3.8 3.5  Chloride 96 - 112 mEq/L 98 99 99  CO2 19 - 32 mEq/L 33(H) 29 31  Calcium 8.4 - 10.5 mg/dL 9.9 10.0 9.6    Current antihypertensive regimen:  HCTZ '25mg'$ , 1 tablet once daily (patient takes in the AM) How often are you checking your Blood Pressure? Patient reports she is checking infrequently, Reports she is out a lot with her husband and often forgets all about doing so. Recommended that she keep a notebook and her cuff next to her medications so that she has a reminder to do so and try and  check and log at-least once a week. She was in agreement. Current home BP readings: 03-25-2021 134/71 & 120/74  Today prior to the call 131/88 & 128/85 What recent interventions/DTPs have been made by any provider to improve Blood Pressure control since last CPP Visit: Patient reports no changes. Any recent hospitalizations or ED visits since last visit with CPP? None What diet changes have been made to improve Blood Pressure Control?  Patient reports she much prefers to eat at home and does not eat out at all. For Breakfast she will have a fruit and yogurt and toast or cereal. For Lunch she is eating garden fresh veggies and  cottage cheese and Dinner varies. She is drinking water does not consume alcohol and eery once in a while has a ginger ale. What exercise is being done to improve your Blood Pressure Control?  Patient reports she is tending her garden outside often as well as 1-2 hours of walking with her husband on most days.  Adherence Review: Is the patient currently on ACE/ARB medication? No Does the patient have >5 day gap between last estimated fill dates? No   Care Gaps: COVID Vaccines - Overdue Flu Vaccines - Overdue AWV - Done 12-03-2020 CCM F/U Declined at this time  Star Rating Drugs: Simvastatin (Zocor) 20 mg - Last filled 12-25-2020 90 DS at [facility]  Notes: Patient repots no side effects from any medications, no refills needed on any medications no pharmacy issues. She reports her med's are waiting for her at the pharmacy but she has not picked up yet because she still has 5 days left at home. She reports she had surgery on her neck years ago and it sometimes is painful for her to turn her neck. Advised her per her medication list she is ok to take 200 mg of Ibuprofen PRN for pain she reports she is aware but does not like to take pain med's unless it is really bad, but will take out the bottle as a reminder to herself. Offered her an appointment to follow up with Jeni Salles she states she does not feel she is in need of one at this time. She is ok with me checking back in with her in a few months on her blood pressures.  [place] Clinical Pharmacist Assistant [phone]

## 2021-03-25 ENCOUNTER — Other Ambulatory Visit: Payer: Self-pay | Admitting: Adult Health

## 2021-04-22 ENCOUNTER — Telehealth: Payer: PPO

## 2021-05-13 ENCOUNTER — Other Ambulatory Visit: Payer: Self-pay

## 2021-05-13 ENCOUNTER — Ambulatory Visit (INDEPENDENT_AMBULATORY_CARE_PROVIDER_SITE_OTHER)
Admission: RE | Admit: 2021-05-13 | Discharge: 2021-05-13 | Disposition: A | Discharge status: Home / Self Care | Payer: PPO | Source: Ambulatory Visit | Attending: Adult Health | Admitting: Adult Health

## 2021-05-13 DIAGNOSIS — M4802 Spinal stenosis, cervical region: Secondary | ICD-10-CM | POA: Diagnosis not present

## 2021-05-13 DIAGNOSIS — M5033 Other cervical disc degeneration, cervicothoracic region: Secondary | ICD-10-CM | POA: Diagnosis not present

## 2021-05-13 DIAGNOSIS — M4319 Spondylolisthesis, multiple sites in spine: Secondary | ICD-10-CM | POA: Diagnosis not present

## 2021-05-13 DIAGNOSIS — M47812 Spondylosis without myelopathy or radiculopathy, cervical region: Secondary | ICD-10-CM | POA: Diagnosis not present

## 2021-05-18 ENCOUNTER — Telehealth: Payer: Self-pay | Admitting: Pharmacist

## 2021-05-18 NOTE — Chronic Care Management (AMB) (Signed)
Chronic Care Management Pharmacy Assistant   Name: TOLUWANIMI RADEBAUGH  MRN: [id number] DOB: Apr 14, 1951  Reason for Encounter: Disease State / Hypertension Assessment Call    Conditions to be addressed/monitored: HTN  Recent office visits:  None  Recent consult visits:  None  Hospital visits:  None in previous 6 months  Medications: Outpatient Encounter Medications as of 05/18/2021  Medication Sig   Acetylcysteine (NAC) 500 MG CAPS Take by mouth.   amphetamine-dextroamphetamine (ADDERALL) 20 MG tablet Take 1 tablet (20 mg total) by mouth 2 (two) times daily.   amphetamine-dextroamphetamine (ADDERALL) 20 MG tablet Take 1 tablet (20 mg total) by mouth 2 (two) times daily.   amphetamine-dextroamphetamine (ADDERALL) 20 MG tablet Take 1 tablet (20 mg total) by mouth 2 (two) times daily.   Ascorbic Acid (VITAMIN C) 1000 MG tablet Take 1,000 mg by mouth daily.   Cholecalciferol (D3-50 PO) Take by mouth.   ezetimibe (ZETIA) 10 MG tablet Take 1 tablet (10 mg total) by mouth daily.   hydrochlorothiazide (HYDRODIURIL) 25 MG tablet TAKE 1 TABLET(25 MG) BY MOUTH DAILY   ibuprofen (ADVIL,MOTRIN) 200 MG tablet Take 200 mg by mouth every 6 (six) hours as needed.   simvastatin (ZOCOR) 20 MG tablet TAKE 1 TABLET(20 MG) BY MOUTH EVERY OTHER DAY   SYNTHROID 75 MCG tablet TAKE 1 TABLET(75 MCG) BY MOUTH DAILY   vitamin B-12 (CYANOCOBALAMIN) 1000 MCG tablet Take 1,000 mcg by mouth 2 (two) times a week.   Zinc 20 MG CAPS Take by mouth.   No facility-administered encounter medications on file as of 05/18/2021.  Reviewed chart prior to disease state call. Spoke with patient regarding BP  Recent Office Vitals: BP Readings from Last 3 Encounters:  02/10/21 128/86  07/22/20 136/90  02/05/20 140/86   Pulse Readings from Last 3 Encounters:  02/10/21 80  08/07/19 (!) 104  10/30/17 95    Wt Readings from Last 3 Encounters:  02/10/21 115 lb (52.2 kg)  07/22/20 119 lb (54 kg)  02/05/20 117 lb  (53.1 kg)     Kidney Function Lab Results  Component Value Date/Time   CREATININE 0.72 02/10/2021 09:44 AM   CREATININE 0.80 02/05/2020 08:57 AM   CREATININE 0.70 01/31/2019 10:08 AM   GFR 83.57 02/10/2021 09:44 AM   GFRNONAA 74 02/05/2020 08:57 AM   GFRAA 86 02/05/2020 08:57 AM    BMP Latest Ref Rng & Units 02/10/2021 02/05/2020 01/31/2019  Glucose 70 - 99 mg/dL 103(H) 102(H) 102(H)  BUN 6 - 23 mg/dL 20 16 18   Creatinine 0.40 - 1.20 mg/dL 0.72 0.80 0.70  BUN/Creat Ratio 6 - 22 (calc) - NOT APPLICABLE -  Sodium 774 - 145 mEq/L 139 139 140  Potassium 3.5 - 5.1 mEq/L 3.8 3.8 3.5  Chloride 96 - 112 mEq/L 98 99 99  CO2 19 - 32 mEq/L 33(H) 29 31  Calcium 8.4 - 10.5 mg/dL 9.9 10.0 9.6    Current antihypertensive regimen:  HCTZ 25mg , 1 tablet once daily (patient takes in the AM) How often are you checking your Blood Pressure? weekly Current home BP readings: Patient reports reading of 139/86 and pulse of 77 What recent interventions/DTPs have been made by any provider to improve Blood Pressure control since last CPP Visit: Patient reports no changes Any recent hospitalizations or ED visits since last visit with CPP? none What diet changes have been made to improve Blood Pressure Control?  Patient reports no diet changes, still eating at home more than out including fruits,  grains and water in diet. Occasional glass of wine or ginger ale. What exercise is being done to improve your Blood Pressure Control?  Patient reports she and her husband have been walking still almost daily.  Adherence Review: Is the patient currently on ACE/ARB medication? No Does the patient have >5 day gap between last estimated fill dates? No  Notes: Patient reports she has been having more pain than usual in her neck is hard for her to turn her head either direction easily she will use OTC medication for pain when it gets really bad but does not like to take. She reports she will be going for an MRI the end of  the month, she also notices a popping in her jaw that happens when she is normal. She also reports she had seen Dr Lorre Nick in regards to her knee pain he advised she would be a candidate for knee replacement but has not needed it yet since he also gave her an injection she is due to follow up with him also in a few months. Advised her that the next contact would be with the Pharmacist in February and I would call her the day prior for a reminder and she may call me prior if any thing comes up or she has any questions/concerns,she was in agreement.  Care Gaps: COVID Vaccines - Overdue Flu Vaccines - Overdue AWV - Done 11/2020 CCM F/U - 2/23 BP- 128/76  Star Rating Drugs: Simvastatin (Zocor) 20 mg - Last filled 03/26/2021 90 DS at [facility] Pharmacist Assistant [phone]

## 2021-05-19 ENCOUNTER — Telehealth: Payer: Self-pay | Admitting: Adult Health

## 2021-05-19 NOTE — Telephone Encounter (Signed)
Pt is returning cory call concerning cervical spine xray

## 2021-05-20 ENCOUNTER — Telehealth: Payer: Self-pay | Admitting: Adult Health

## 2021-05-20 DIAGNOSIS — M4802 Spinal stenosis, cervical region: Secondary | ICD-10-CM

## 2021-05-20 NOTE — Telephone Encounter (Signed)
Spoke to patient and informed of xray results which showed   IMPRESSION: 1. Solid C5-C7 anterior fusion. 2. 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 3. Multilevel facet hypertrophy. Right bony neural foraminal narrowing at C3-C4.    She continues to have pain with ROM of neck and has felt a bulge.   Will refer back to Dr. Vertell Limber and order MRI

## 2021-05-20 NOTE — Telephone Encounter (Signed)
Please advise 

## 2021-06-01 DIAGNOSIS — M1712 Unilateral primary osteoarthritis, left knee: Secondary | ICD-10-CM | POA: Diagnosis not present

## 2021-06-07 ENCOUNTER — Ambulatory Visit
Admission: RE | Admit: 2021-06-07 | Discharge: 2021-06-07 | Disposition: A | Discharge status: Home / Self Care | Payer: PPO | Source: Ambulatory Visit | Attending: Adult Health | Admitting: Adult Health

## 2021-06-07 DIAGNOSIS — M4802 Spinal stenosis, cervical region: Secondary | ICD-10-CM

## 2021-06-07 DIAGNOSIS — M542 Cervicalgia: Secondary | ICD-10-CM | POA: Diagnosis not present

## 2021-06-15 DIAGNOSIS — H2513 Age-related nuclear cataract, bilateral: Secondary | ICD-10-CM | POA: Diagnosis not present

## 2021-06-15 DIAGNOSIS — M542 Cervicalgia: Secondary | ICD-10-CM | POA: Diagnosis not present

## 2021-06-15 DIAGNOSIS — H524 Presbyopia: Secondary | ICD-10-CM | POA: Diagnosis not present

## 2021-06-23 ENCOUNTER — Other Ambulatory Visit: Payer: Self-pay | Admitting: Adult Health

## 2021-07-28 ENCOUNTER — Telehealth: Payer: Self-pay | Admitting: Adult Health

## 2021-07-28 NOTE — Telephone Encounter (Signed)
Pt has an appt on 08-17-2021. Pt was tapering out of adderall and she is having trouble focusing and would like a refill on amphetamine-dextroamphetamine (ADDERALL) 20 MG tablet   [facility] [place], [place] AT [facility] [place] Phone:  [phone]  Fax:  [phone]

## 2021-07-29 NOTE — Telephone Encounter (Signed)
Patient came in stating that her pharmacy didn't have any adderall in stock. I advised her to call around to see which pharmacy does have the medication and to give [place] a phone call back with that pharmacy information.  Patient verbalized understanding.  Please advise.

## 2021-07-29 NOTE — Telephone Encounter (Signed)
Patient provided [place] with a [facility] that does have Adderall. The pharmacy stated that they have the 30 MG and the 15 MG in stock.   The pharmacy that has the medication is the [facility] on [address], [place], [place] [address].  Patient would like to be contacted at [phone].  Please advise.

## 2021-07-30 ENCOUNTER — Other Ambulatory Visit: Payer: Self-pay | Admitting: Adult Health

## 2021-07-30 DIAGNOSIS — F909 Attention-deficit hyperactivity disorder, unspecified type: Secondary | ICD-10-CM

## 2021-07-30 MED ORDER — AMPHETAMINE-DEXTROAMPHETAMINE 20 MG PO TABS: 20.0000 mg | ORAL_TABLET | Freq: Two times a day (BID) | ORAL | 0 refills | Status: DC

## 2021-07-30 NOTE — Telephone Encounter (Signed)
Please advise 

## 2021-08-04 ENCOUNTER — Telehealth: Payer: Self-pay | Admitting: Adult Health

## 2021-08-04 NOTE — Telephone Encounter (Signed)
Disregard

## 2021-08-09 ENCOUNTER — Ambulatory Visit: Payer: PPO

## 2021-08-17 ENCOUNTER — Encounter: Payer: Self-pay | Admitting: Adult Health

## 2021-08-17 ENCOUNTER — Ambulatory Visit (INDEPENDENT_AMBULATORY_CARE_PROVIDER_SITE_OTHER): Payer: PPO | Admitting: Adult Health

## 2021-08-17 VITALS — BP 130/80 | HR 70 | Temp 97.8°F | Wt 112.0 lb

## 2021-08-17 DIAGNOSIS — F909 Attention-deficit hyperactivity disorder, unspecified type: Secondary | ICD-10-CM | POA: Diagnosis not present

## 2021-08-17 MED ORDER — AMPHETAMINE-DEXTROAMPHET ER 10 MG PO CP24: 10.0000 mg | ORAL_CAPSULE | Freq: Every day | ORAL | 0 refills | Status: DC

## 2021-08-17 NOTE — Progress Notes (Signed)
Subjective:    Patient ID: Gabrielle Vargas, female    DOB: 1951-07-07, 71 y.o.   MRN: [id number]  HPI 71 year old female who  has a past medical history of Arthritis, Asthma, Diverticula, colon, Hyperlipidemia, Hypertension, and Hypothyroidism.  She presents to the office today for 73-month follow-up regarding ADHD.   She reports that over the last few months she has weaned herself off the medication as she did not feel like she wanted to take the medication any longer. She does find that since being off the medication that she has trouble concentrating and her attention span has decreased, especially when participating in bible study and reading.  She would like to go back on Adderall but try a lower dose  Reviewed controlled substance database and no red flags noted   Review of Systems See HPI   Past Medical History:  Diagnosis Date   Arthritis    Osteo   Asthma    Seasonal   Diverticula, colon    Hyperlipidemia    Hypertension    Hypothyroidism     Social History   Socioeconomic History   Marital status: Married    Spouse name: Not on file   Number of children: 2   Years of education: Not on file   Highest education level: Not on file  Occupational History   Occupation: Retired  Tobacco Use   Smoking status: Never   Smokeless tobacco: Never  Substance and Sexual Activity   Alcohol use: No   Drug use: No   Sexual activity: Not on file  Other Topics Concern   Not on file  Social History Narrative   Retired   Married      Social Determinants of Radio broadcast assistant Strain: Not on file  Food Insecurity: No Food Insecurity   Worried About Charity fundraiser in the Last Year: Never true   Arboriculturist in the Last Year: Never true  Transportation Needs: Not on file  Physical Activity: Sufficiently Active   Days of Exercise per Week: 5 days   Minutes of Exercise per Session: 80 min  Stress: No Stress Concern Present   Feeling of Stress :  Not at all  Social Connections: Moderately Integrated   Frequency of Communication with Friends and Family: Three times a week   Frequency of Social Gatherings with Friends and Family: Three times a week   Attends Religious Services: More than 4 times per year   Active Member of Clubs or Organizations: No   Attends Archivist Meetings: Never   Marital Status: Married  Human resources officer Violence: Not At Risk   Fear of Current or Ex-Partner: No   Emotionally Abused: No   Physically Abused: No   Sexually Abused: No    Past Surgical History:  Procedure Laterality Date   ABDOMINAL HYSTERECTOMY     BREAST SURGERY     CARPAL TUNNEL RELEASE Bilateral    CERVICAL [place]     x2   left shoulder surgery     MENISCUS REPAIR Right 2017   REDUCTION MAMMAPLASTY Bilateral    right shoulder     TONSILLECTOMY      Family History  Problem Relation Age of Onset   Kidney disease Father    Diabetes Father    Heart disease Father    Melanoma Mother    Colon cancer Neg Hx    Esophageal cancer Neg Hx  Rectal cancer Neg Hx    Stomach cancer Neg Hx     Allergies  Allergen Reactions   Latex     REACTION: rash/hives   Oxycodone-Acetaminophen     REACTION: nausea    Current Outpatient Medications on File Prior to Visit  Medication Sig Dispense Refill   Acetylcysteine (NAC) 500 MG CAPS Take by mouth.     Ascorbic Acid (VITAMIN C) 1000 MG tablet Take 1,000 mg by mouth daily.     Cholecalciferol (D3-50 PO) Take by mouth.     ezetimibe (ZETIA) 10 MG tablet Take 1 tablet (10 mg total) by mouth daily. 90 tablet 3   hydrochlorothiazide (HYDRODIURIL) 25 MG tablet TAKE 1 TABLET(25 MG) BY MOUTH DAILY 90 tablet 3   ibuprofen (ADVIL,MOTRIN) 200 MG tablet Take 200 mg by mouth every 6 (six) hours as needed.     simvastatin (ZOCOR) 20 MG tablet TAKE 1 TABLET(20 MG) BY MOUTH EVERY OTHER DAY 45 tablet 3   SYNTHROID 75 MCG tablet TAKE 1 TABLET(75 MCG) BY MOUTH  DAILY 90 tablet 3   vitamin B-12 (CYANOCOBALAMIN) 1000 MCG tablet Take 1,000 mcg by mouth 2 (two) times a week.     Zinc 20 MG CAPS Take by mouth.     No current facility-administered medications on file prior to visit.    BP 130/80    Pulse 70    Temp 97.8 F (36.6 C)    Wt 112 lb (50.8 kg)    SpO2 97%    BMI 22.62 kg/m       Objective:   Physical Exam Vitals and nursing note reviewed.  Constitutional:      Appearance: Normal appearance.  Cardiovascular:     Rate and Rhythm: Normal rate and regular rhythm.     Pulses: Normal pulses.     Heart sounds: Normal heart sounds.  Pulmonary:     Breath sounds: Normal breath sounds.  Skin:    General: Skin is warm and dry.  Neurological:     General: No focal deficit present.     Mental Status: She is alert and oriented to person, place, and time.  Psychiatric:        Mood and Affect: Mood normal.        Behavior: Behavior normal.        Thought Content: Thought content normal.        Judgment: Judgment normal.      Assessment & Plan:  1. Attention deficit hyperactivity disorder (ADHD), unspecified ADHD type - Will trial her on Adderall 10 mg XR.  - She will follow up with me via mychart in the next 2 weeks to let me know how she is doing on this dose.  - amphetamine-dextroamphetamine (ADDERALL XR) 10 MG 24 hr capsule; Take 1 capsule (10 mg total) by mouth daily.  Dispense: 30 capsule; Refill: 0  [name], NP

## 2021-09-09 ENCOUNTER — Telehealth: Payer: Self-pay | Admitting: Pharmacist

## 2021-09-09 NOTE — Chronic Care Management (AMB) (Signed)
° ° °  Chronic Care Management Pharmacy Assistant   Name: Gabrielle Vargas  MRN: [id number] DOB: [birth year]  09/09/21 APPOINTMENT REMINDER   Called Patient No answer, left message of appointment on 09/13/21 at 2:45 via telephone visit with Jeni Salles, Pharm D.   Notified to have all medications, supplements, blood pressure and/or blood sugar logs available during appointment and to return call if need to reschedule.    Care Gaps: COVID Vaccine - Overdue BP-  130/80 (08/17/21) AWV-5/22   Star Rating Drug: Simvastatin 20 mg - Last filled 06/30/21 90 DS at [facility]  Any gaps in medications fill history? None      Medications: Outpatient Encounter Medications as of 09/09/2021  Medication Sig   Acetylcysteine (NAC) 500 MG CAPS Take by mouth.   amphetamine-dextroamphetamine (ADDERALL XR) 10 MG 24 hr capsule Take 1 capsule (10 mg total) by mouth daily.   Ascorbic Acid (VITAMIN C) 1000 MG tablet Take 1,000 mg by mouth daily.   Cholecalciferol (D3-50 PO) Take by mouth.   ezetimibe (ZETIA) 10 MG tablet Take 1 tablet (10 mg total) by mouth daily.   hydrochlorothiazide (HYDRODIURIL) 25 MG tablet TAKE 1 TABLET(25 MG) BY MOUTH DAILY   ibuprofen (ADVIL,MOTRIN) 200 MG tablet Take 200 mg by mouth every 6 (six) hours as needed.   simvastatin (ZOCOR) 20 MG tablet TAKE 1 TABLET(20 MG) BY MOUTH EVERY OTHER DAY   SYNTHROID 75 MCG tablet TAKE 1 TABLET(75 MCG) BY MOUTH DAILY   vitamin B-12 (CYANOCOBALAMIN) 1000 MCG tablet Take 1,000 mcg by mouth 2 (two) times a week.   Zinc 20 MG CAPS Take by mouth.   No facility-administered encounter medications on file as of 09/09/2021.    [place] Clinical Pharmacist Assistant [phone]

## 2021-09-13 ENCOUNTER — Ambulatory Visit (INDEPENDENT_AMBULATORY_CARE_PROVIDER_SITE_OTHER): Payer: PPO | Admitting: Pharmacist

## 2021-09-13 DIAGNOSIS — I10 Essential (primary) hypertension: Secondary | ICD-10-CM

## 2021-09-13 DIAGNOSIS — F909 Attention-deficit hyperactivity disorder, unspecified type: Secondary | ICD-10-CM

## 2021-09-13 NOTE — Progress Notes (Signed)
Chronic Care Management Pharmacy Note  09/14/2021 Name:  Gabrielle Vargas MRN:  [id number] DOB:  1950-11-01  Summary: Pt reports the Adderall is helping but would like to consider a higher dose   Recommendations/Changes made from today's visit: -Consider increasing Adderall XR to 20 mg daily based on patient request -Recommended routine BP monitoring at home   Plan: Follow up after discussion with PCP  Subjective: Gabrielle Vargas is an 71 y.o. year old female who is a primary patient of [name], NP.  The CCM team was consulted for assistance with disease management and care coordination needs.    Engaged with patient by telephone for follow up visit in response to provider referral for pharmacy case management and/or care coordination services.   Consent to Services:  The patient was given information about Chronic Care Management services, agreed to services, and gave verbal consent prior to initiation of services.  Please see initial visit note for detailed documentation.   Patient Care Team: [name], NP as PCP - General (Family Medicine) Viona Gilmore, [facility] as Pharmacist (Pharmacist)  Recent office visits: 08/17/21 [name], NP: Patient presented for ADHD follow up. Patient weaned herself off as she did not want to take the medication any longer. She reports difficulty concentrating and wants to retry it at a lower dose. Trial on Adderall XR 10 mg.  02/10/21 [name], NP - Patient presented for Routine general medical examination and other concerns. Prescribed ezetimibe (ZETIA) 10 MG.  Recent consult visits: 06/15/21 Kary Kos, MD (neurosurgery): Patient presented for cervicalgia follow up. Unable to access notes.  06/15/21 Katherine Mantle (optometry): Patient presented for eye exam. Unable to access notes.  06/01/21 Carlyon Shadow, PA-C (sports medicine): Patient presented for left knee pain follow up. Administered steroid injection in left  knee. Consider partial knee replacement in the future.  02/05/21  Imaging - Patient presented for a [facility] visits: None in previous 6 months  Objective:  Lab Results  Component Value Date   CREATININE 0.72 02/10/2021   BUN 20 02/10/2021   GFR 83.57 02/10/2021   GFRNONAA 74 02/05/2020   GFRAA 86 02/05/2020   NA 139 02/10/2021   K 3.8 02/10/2021   CALCIUM 9.9 02/10/2021   CO2 33 (H) 02/10/2021   GLUCOSE 103 (H) 02/10/2021    Lab Results  Component Value Date/Time   GFR 83.57 02/10/2021 09:44 AM   GFR 82.65 01/31/2019 10:08 AM    Last diabetic Eye exam: No results found for: HMDIABEYEEXA  Last diabetic Foot exam: No results found for: HMDIABFOOTEX   Lab Results  Component Value Date   CHOL 201 (H) 02/10/2021   HDL 61.10 02/10/2021   LDLCALC 107 (H) 02/10/2021   LDLDIRECT 158.7 06/19/2014   TRIG 164.0 (H) 02/10/2021   CHOLHDL 3 02/10/2021    Hepatic Function Latest Ref Rng & Units 02/10/2021 02/05/2020 01/31/2019  Total Protein 6.0 - 8.3 g/dL 7.0 6.9 7.1  Albumin 3.5 - 5.2 g/dL 4.6 - 4.7  AST 0 - 37 U/L _0 ALT 0 - 35 U/L _1 Alk Phosphatase 39 - 117 U/L 100 - 108  Total Bilirubin 0.2 - 1.2 mg/dL 0.5 0.5 0.6  Bilirubin, Direct 0.0 - 0.3 mg/dL - - -    Lab Results  Component Value Date/Time   TSH 1.03 02/10/2021 09:44 AM   TSH 1.60 02/05/2020 08:57 AM   FREET4 1.07 02/25/2019 09:48 AM    CBC Latest Ref  Rng & Units 02/10/2021 02/05/2020 08/07/2019  WBC 4.0 - 10.5 K/uL 6.6 8.3 7.6  Hemoglobin 12.0 - 15.0 g/dL 14.1 14.9 14.6  Hematocrit 36.0 - 46.0 % 41.4 44.8 43.0  Platelets 150.0 - 400.0 K/uL 221.0 238 272.0    No results found for: VD25OH  Clinical ASCVD: No  The 10-year ASCVD risk score (Arnett DK, et al., 2019) is: 15.8%   Values used to calculate the score:     Age: 22 years     Sex: Female     Is Non-Hispanic African American: No     Diabetic: No     Tobacco smoker: No     Systolic Blood Pressure: 751 mmHg     Is  BP treated: Yes     HDL Cholesterol: 61.1 mg/dL     Total Cholesterol: 201 mg/dL    Depression screen [facility] 2/9 08/17/2021 12/03/2020 02/05/2020  Decreased Interest 0 0 0  Down, Depressed, Hopeless 0 0 0  PHQ - 2 Score 0 0 0  Altered sleeping 0 - -  Tired, decreased energy 0 - -  Change in appetite 0 - -  Feeling bad or failure about yourself  0 - -  Trouble concentrating 3 - -  Moving slowly or fidgety/restless 0 - -  PHQ-9 Score 3 - -  Difficult doing work/chores Not difficult at all - -      Social History   Tobacco Use  Smoking Status Never  Smokeless Tobacco Never   BP Readings from Last 3 Encounters:  08/17/21 130/80  02/10/21 128/86  07/22/20 136/90   Pulse Readings from Last 3 Encounters:  08/17/21 70  02/10/21 80  08/07/19 (!) 104   Wt Readings from Last 3 Encounters:  08/17/21 112 lb (50.8 kg)  02/10/21 115 lb (52.2 kg)  07/22/20 119 lb (54 kg)   BMI Readings from Last 3 Encounters:  08/17/21 22.62 kg/m  02/10/21 23.23 kg/m  07/22/20 24.04 kg/m    Assessment/Interventions: Review of patient past medical history, allergies, medications, health status, including review of consultants reports, laboratory and other test data, was performed as part of comprehensive evaluation and provision of chronic care management services.   SDOH:  (Social Determinants of Health) assessments and interventions performed: No  SDOH Screenings   Alcohol Screen: Low Risk    Last Alcohol Screening Score (AUDIT): 0  Depression (PHQ2-9): Low Risk    PHQ-2 Score: 3  Financial Resource Strain: Not on file  Food Insecurity: No Food Insecurity   Worried About Charity fundraiser in the Last Year: Never true   Ran Out of Food in the Last Year: Never true  Housing: Low Risk    Last Housing Risk Score: 0  Physical Activity: Sufficiently Active   Days of Exercise per Week: 5 days   Minutes of Exercise per Session: 80 min  Social Connections: Moderately Integrated   Frequency of  Communication with Friends and Family: Three times a week   Frequency of Social Gatherings with Friends and Family: Three times a week   Attends Religious Services: More than 4 times per year   Active Member of Clubs or Organizations: No   Attends Archivist Meetings: Never   Marital Status: Married  Stress: No Stress Concern Present   Feeling of Stress : Not at all  Tobacco Use: Low Risk    Smoking Tobacco Use: Never   Smokeless Tobacco Use: Never   Passive Exposure: Not on file  Transportation Needs: Not  on file    CCM Care Plan  Allergies  Allergen Reactions   Latex     REACTION: rash/hives   Oxycodone-Acetaminophen     REACTION: nausea    Medications Reviewed Today     Reviewed by [name], NP (Nurse Practitioner) on 08/17/21 at [phone]  Med List Status: <None>   Medication Order Taking? Sig Documenting Provider Last Dose Status Informant  Acetylcysteine (NAC) 500 MG CAPS [id number] Yes Take by mouth. [provider] Taking Active   amphetamine-dextroamphetamine (ADDERALL XR) 10 MG 24 hr capsule [id number] Yes Take 1 capsule (10 mg total) by mouth daily. Nafziger, Tommi Rumps, NP  Active   Ascorbic Acid (VITAMIN C) 1000 MG tablet [id number] Yes Take 1,000 mg by mouth daily. [provider] Taking Active   Cholecalciferol (D3-50 PO) [id number] Yes Take by mouth. [provider] Taking Active   ezetimibe (ZETIA) 10 MG tablet [id number] Yes Take 1 tablet (10 mg total) by mouth daily. Nafziger, Tommi Rumps, NP Taking Active   hydrochlorothiazide (HYDRODIURIL) 25 MG tablet [id number] Yes TAKE 1 TABLET(25 MG) BY MOUTH DAILY Nafziger, Tommi Rumps, NP Taking Active   ibuprofen (ADVIL,MOTRIN) 200 MG tablet [id number] Yes Take 200 mg by mouth every 6 (six) hours as needed. [provider] Taking Active   simvastatin (ZOCOR) 20 MG tablet [id number] Yes TAKE 1 TABLET(20 MG) BY MOUTH EVERY OTHER DAY Nafziger, Tommi Rumps, NP Taking Active   SYNTHROID 75 MCG tablet [id number]  Yes TAKE 1 TABLET(75 MCG) BY MOUTH DAILY Nafziger, Tommi Rumps, NP Taking Active   vitamin B-12 (CYANOCOBALAMIN) 1000 MCG tablet [id number] Yes Take 1,000 mcg by mouth 2 (two) times a week. [provider] Taking Active   Zinc 20 MG CAPS [id number] Yes Take by mouth. [provider] Taking Active             Patient Active Problem List   Diagnosis Date Noted   Essential hypertension 10/09/2017   Low back pain radiating to right leg 10/09/2017   Mild intermittent asthma with acute exacerbation 05/30/2017   Routine general medical examination at a health care facility 08/18/2015   Diverticulitis of colon (without mention of hemorrhage)(562.11) 09/17/2013   VAGINITIS, ATROPHIC, SEVERE 08/18/2008   Hypothyroidism 05/09/2007   Attention deficit disorder 05/09/2007    Immunization History  Administered Date(s) Administered   Influenza Split 05/08/2012, 04/01/2013   Influenza Whole 05/09/2007, 04/22/2008, 04/17/2009, 04/19/2010   Influenza, High Dose Seasonal PF 04/29/2017, 04/19/2018, 04/17/2020, 04/21/2021   Influenza-Unspecified 03/19/2014, 03/31/2015   Pneumococcal Conjugate-13 10/16/2013   Pneumococcal Polysaccharide-23 08/18/2015   Td 01/03/2005   Tdap 01/27/2016   Zoster Recombinat (Shingrix) 02/10/2021, 07/26/2021   Zoster, Live 03/31/2015   Patient reports everything is going well right now except for her concerns with the Adderall shortage.  Patient and her husband have been walking almost every day depending on the weather.   Conditions to be addressed/monitored:  Hypertension, Hyperlipidemia, Hypothyroidism and ADHD, vitamin B12 deficiency  Conditions addressed this visit: Hyperlipidemia, ADHD  Care Plan : CCM Pharmacy Care Plan  Updates made by Viona Gilmore, [place] since 09/14/2021 12:00 AM     Problem: Problem: Hypertension, Hyperlipidemia, Hypothyroidism and ADHD, vitamin B12 deficiency      Long-Range Goal: Patient-Specific Goal   Start Date:  10/22/2020  Expected End Date: 10/22/2021  Recent Progress: On track  Priority: High  Note:   Current Barriers:  Unable to independently monitor therapeutic efficacy  Pharmacist Clinical Goal(s):  Patient will achieve adherence to monitoring guidelines and medication adherence to  achieve therapeutic efficacy through collaboration with PharmD and provider.   Interventions: 1:1 collaboration with [name], NP regarding development and update of comprehensive plan of care as evidenced by provider attestation and co-signature Inter-disciplinary care team collaboration (see longitudinal plan of care) Comprehensive medication review performed; medication list updated in electronic medical record  Hypertension (BP goal <140/90) -Controlled -Current treatment: HCTZ 25m, 1 tablet once daily (patient takes in the AM)  - Appropriate, Effective, Safe, Accessible -Medications previously tried: none  -Current home readings: not checking at home consistently through spells) -Current dietary habits: Patient is following a healthy diet and eats mostly vegetables and meat -Current exercise habits: Patient and husband continue to walk about 1 to 2 hours every day depending on weather -Denies hypotensive/hypertensive symptoms -Educated on Importance of home blood pressure monitoring; Proper BP monitoring technique; -Counseled to monitor BP at home weekly, document, and provide log at future appointments -Counseled on diet and exercise extensively Recommended to continue current medication  Hyperlipidemia: (LDL goal < 100) -Uncontrolled -Current treatment: simvastatin 236m 1 tablet every other day - Appropriate, Query effective, Safe, Accessible Ezetimibe 10 mg 1 tablet daily - Appropriate, Query effective, Safe, Accessible -Medications previously tried: eating a lot of vegetables -Current dietary patterns: ice cream is her weakness -Current exercise habits: walking every day -Educated on  Cholesterol goals;  Importance of limiting foods high in cholesterol; Exercise goal of 150 minutes per week; -Counseled on diet and exercise extensively Recommended to continue current medication  Hypothyroidism (Goal: TSH 0.35-4.5) -Controlled -Current treatment  Synthroid (levothyroxine 7562m 1 tablet once daily  (patient reports can only take Brand name) - Appropriate, Effective, Safe, Accessible -Medications previously tried: none  -Recommended to continue current medication  ADHD (Goal: minimize symptoms and maintain focus) -Controlled -Current treatment  Amphetamine - dextroamphetamine (Adderall XR) 64m62m tablet daily - Appropriate, Query effective, Safe, Query accessible -Medications previously tried: none  -Recommended to continue current medication Collaborated with PCP to consider increasing to 20 mg daily.  Vitamin B12 deficiency (Goal: 211-911) -Controlled -Current treatment  Vitamin B12 SL 1000 mcg 1 tablet once or twice a week  - Appropriate, Effective, Safe, Accessible -Medications previously tried: vitamin B12 injection  -Recommended repeat vitamin B12 level  Health Maintenance -Vaccine gaps: COVID (patient declines), shingrix -Current therapy:  ibuprofen 200mg97mtablet every six hours as needed (patient notes taking after long walks. She states she does not take very often) Vitamin C 1000 mg daily Vitamin D 2000 units daily Zinc 50 mg daily -Educated on Cost vs benefit of each product must be carefully weighed by individual consumer -Patient is satisfied with current therapy and denies issues -Recommended to continue current medication  Patient Goals/Self-Care Activities Patient will:  - take medications as prescribed check blood pressure weekly, document, and provide at future appointments target a minimum of 150 minutes of moderate intensity exercise weekly  Follow Up Plan: Telephone follow up appointment with care management team member scheduled  for: The patient has been provided with contact information for the care management team and has been advised to call with any health related questions or concerns.         Medication Assistance: None required.  Patient affirms current coverage meets needs.  Compliance/Adherence/Medication fill history: Care Gaps: COVID vaccine BP-  130/80 (08/17/21)   Star-Rating Drugs: Simvastatin 20 mg - [id number]  [facility] OF [facility] [address] [place] [address] Phone: [phone] Fax: [facility], [address] AT [facility] OF [address] [place] [address] Phone: [phone] Fax: [phone]  Uses pill box? No - uses color coded pilltower Pt endorses 100% compliance  We discussed: Current pharmacy is preferred with insurance plan and patient is satisfied with pharmacy services Patient decided to: Continue current medication management strategy  Care Plan and Follow Up Patient Decision:  Patient agrees to Care Plan and Follow-up.  Plan: The patient has been provided with contact information for the care management team and has been advised to call with any health related questions or concerns.   Jeni Salles, PharmD [employer] Clinical Pharmacist [place] [facility] [place]

## 2021-09-14 DIAGNOSIS — E785 Hyperlipidemia, unspecified: Secondary | ICD-10-CM | POA: Diagnosis not present

## 2021-09-14 DIAGNOSIS — I10 Essential (primary) hypertension: Secondary | ICD-10-CM

## 2021-09-14 NOTE — Patient Instructions (Signed)
Hi Bunnie,  It was great to catch up with you again!  Please reach out to me if you have any questions or need anything!  Best, Maddie  Jeni Salles, PharmD, [facility]   Visit Information   Goals Addressed   None    Patient Care Plan: CCM Pharmacy Care Plan     Problem Identified: Problem: Hypertension, Hyperlipidemia, Hypothyroidism and ADHD, vitamin B12 deficiency      Long-Range Goal: Patient-Specific Goal   Start Date: 10/22/2020  Expected End Date: 10/22/2021  Recent Progress: On track  Priority: High  Note:   Current Barriers:  Unable to independently monitor therapeutic efficacy  Pharmacist Clinical Goal(s):  Patient will achieve adherence to monitoring guidelines and medication adherence to achieve therapeutic efficacy through collaboration with PharmD and provider.   Interventions: 1:1 collaboration with [name], NP regarding development and update of comprehensive plan of care as evidenced by provider attestation and co-signature Inter-disciplinary care team collaboration (see longitudinal plan of care) Comprehensive medication review performed; medication list updated in electronic medical record  Hypertension (BP goal <140/90) -Controlled -Current treatment: HCTZ 25mg , 1 tablet once daily (patient takes in the AM)  - Appropriate, Effective, Safe, Accessible -Medications previously tried: none  -Current home readings: not checking at home consistently through spells) -Current dietary habits: Patient is following a healthy diet and eats mostly vegetables and meat -Current exercise habits: Patient and husband continue to walk about 1 to 2 hours every day depending on weather -Denies hypotensive/hypertensive symptoms -Educated on Importance of home blood pressure monitoring; Proper BP monitoring technique; -Counseled to monitor BP at home weekly, document, and provide log at future  appointments -Counseled on diet and exercise extensively Recommended to continue current medication  Hyperlipidemia: (LDL goal < 100) -Uncontrolled -Current treatment: simvastatin 20mg , 1 tablet every other day - Appropriate, Query effective, Safe, Accessible Ezetimibe 10 mg 1 tablet daily - Appropriate, Query effective, Safe, Accessible -Medications previously tried: eating a lot of vegetables -Current dietary patterns: ice cream is her weakness -Current exercise habits: walking every day -Educated on Cholesterol goals;  Importance of limiting foods high in cholesterol; Exercise goal of 150 minutes per week; -Counseled on diet and exercise extensively Recommended to continue current medication  Hypothyroidism (Goal: TSH 0.35-4.5) -Controlled -Current treatment  Synthroid (levothyroxine 54mcg, 1 tablet once daily  (patient reports can only take Brand name) - Appropriate, Effective, Safe, Accessible -Medications previously tried: none  -Recommended to continue current medication  ADHD (Goal: minimize symptoms and maintain focus) -Controlled -Current treatment  Amphetamine - dextroamphetamine (Adderall XR) 10mg , 1 tablet daily - Appropriate, Query effective, Safe, Query accessible -Medications previously tried: none  -Recommended to continue current medication Collaborated with PCP to consider increasing to 20 mg daily.  Vitamin B12 deficiency (Goal: 211-911) -Controlled -Current treatment  Vitamin B12 SL 1000 mcg 1 tablet once or twice a week  - Appropriate, Effective, Safe, Accessible -Medications previously tried: vitamin B12 injection  -Recommended repeat vitamin B12 level  Health Maintenance -Vaccine gaps: COVID (patient declines), shingrix -Current therapy:  ibuprofen 200mg , 1 tablet every six hours as needed (patient notes taking after long walks. She states she does not take very often) Vitamin C 1000 mg daily Vitamin D 2000 units daily Zinc 50 mg daily -Educated  on Cost vs benefit of each product must be carefully weighed by individual consumer -Patient is satisfied with current therapy and denies issues -Recommended to continue current medication  Patient Goals/Self-Care Activities Patient will:  -  take medications as prescribed check blood pressure weekly, document, and provide at future appointments target a minimum of 150 minutes of moderate intensity exercise weekly  Follow Up Plan: Telephone follow up appointment with care management team member scheduled for: The patient has been provided with contact information for the care management team and has been advised to call with any health related questions or concerns.         Patient verbalizes understanding of instructions and care plan provided today and agrees to view in [facility]. Active MyChart status confirmed with patient.   The pharmacy team will reach out to the patient again over the next 7 days.   Viona Gilmore, [facility]

## 2021-09-15 ENCOUNTER — Ambulatory Visit (INDEPENDENT_AMBULATORY_CARE_PROVIDER_SITE_OTHER): Payer: PPO | Admitting: Adult Health

## 2021-09-15 ENCOUNTER — Encounter: Payer: Self-pay | Admitting: Adult Health

## 2021-09-15 VITALS — BP 122/80 | HR 68 | Temp 97.4°F | Ht 59.0 in | Wt 112.0 lb

## 2021-09-15 DIAGNOSIS — F909 Attention-deficit hyperactivity disorder, unspecified type: Secondary | ICD-10-CM

## 2021-09-15 MED ORDER — AMPHETAMINE-DEXTROAMPHETAMINE 20 MG PO TABS: 20.0000 mg | ORAL_TABLET | Freq: Two times a day (BID) | ORAL | 0 refills | Status: DC

## 2021-09-15 NOTE — Progress Notes (Signed)
? ?Subjective:  ? ? Patient ID: Gabrielle Vargas, female    DOB: 11-01-1950, 71 y.o.   MRN: [id number] ? ?HPI ?71 year old female who  has a past medical history of Arthritis, Asthma, Diverticula, colon, Hyperlipidemia, Hypertension, and Hypothyroidism. ? ?She presents to the office today for follow up regarding ADHD. During her last visit she reported that she had weaned herself off Adderall and she did not feel like she wanted to take the medication any longer. After being off the medication she was having trouble concentrating and her attention span had decreased.  ? ?She wanted to go back on Adderall but to try a lower dose.  ? ?She was started on Adderall 10 mg ER but reports that this has not helped with her concentration.  ? ?She would like to go back on her original dose of 20 mg BID  ? ? ?Review of Systems ?See HPI  ? ?Past Medical History:  ?Diagnosis Date  ? Arthritis   ? Osteo  ? Asthma   ? Seasonal  ? Diverticula, colon   ? Hyperlipidemia   ? Hypertension   ? Hypothyroidism   ? ? ?Social History  ? ?Socioeconomic History  ? Marital status: Married  ?  Spouse name: Not on file  ? Number of children: 2  ? Years of education: Not on file  ? Highest education level: Not on file  ?Occupational History  ? Occupation: Retired  ?Tobacco Use  ? Smoking status: Never  ? Smokeless tobacco: Never  ?Substance and Sexual Activity  ? Alcohol use: No  ? Drug use: No  ? Sexual activity: Not on file  ?Other Topics Concern  ? Not on file  ?Social History Narrative  ? Retired  ? Married  ?   ? ?Social Determinants of Health  ? ?Financial Resource Strain: Not on file  ?Food Insecurity: No Food Insecurity  ? Worried About Charity fundraiser in the Last Year: Never true  ? Ran Out of Food in the Last Year: Never true  ?Transportation Needs: Not on file  ?Physical Activity: Sufficiently Active  ? Days of Exercise per Week: 5 days  ? Minutes of Exercise per Session: 80 min  ?Stress: No Stress Concern Present  ? Feeling of  Stress : Not at all  ?Social Connections: Moderately Integrated  ? Frequency of Communication with Friends and Family: Three times a week  ? Frequency of Social Gatherings with Friends and Family: Three times a week  ? Attends Religious Services: More than 4 times per year  ? Active Member of Clubs or Organizations: No  ? Attends Archivist Meetings: Never  ? Marital Status: Married  ?Intimate Partner Violence: Not At Risk  ? Fear of Current or Ex-Partner: No  ? Emotionally Abused: No  ? Physically Abused: No  ? Sexually Abused: No  ? ? ?Past Surgical History:  ?Procedure Laterality Date  ? ABDOMINAL HYSTERECTOMY    ? BREAST SURGERY    ? CARPAL TUNNEL RELEASE Bilateral   ? CERVICAL DISC ARTHROPLASTY    ? CESAREAN SECTION    ? x2  ? left shoulder surgery    ? MENISCUS REPAIR Right 2017  ? REDUCTION MAMMAPLASTY Bilateral   ? right shoulder    ? TONSILLECTOMY    ? ? ?Family History  ?Problem Relation Age of Onset  ? Kidney disease Father   ? Diabetes Father   ? Heart disease Father   ? Melanoma Mother   ?  Colon cancer Neg Hx   ? Esophageal cancer Neg Hx   ? Rectal cancer Neg Hx   ? Stomach cancer Neg Hx   ? ? ?Allergies  ?Allergen Reactions  ? Latex   ?  REACTION: rash/hives  ? Oxycodone-Acetaminophen   ?  REACTION: nausea  ? ? ?Current Outpatient Medications on File Prior to Visit  ?Medication Sig Dispense Refill  ? Acetylcysteine (NAC) 500 MG CAPS Take by mouth.    ? Ascorbic Acid (VITAMIN C) 1000 MG tablet Take 1,000 mg by mouth daily.    ? Cholecalciferol (D3-50 PO) Take by mouth.    ? ezetimibe (ZETIA) 10 MG tablet Take 1 tablet (10 mg total) by mouth daily. 90 tablet 3  ? hydrochlorothiazide (HYDRODIURIL) 25 MG tablet TAKE 1 TABLET(25 MG) BY MOUTH DAILY 90 tablet 3  ? ibuprofen (ADVIL,MOTRIN) 200 MG tablet Take 200 mg by mouth every 6 (six) hours as needed.    ? simvastatin (ZOCOR) 20 MG tablet TAKE 1 TABLET(20 MG) BY MOUTH EVERY OTHER DAY 45 tablet 3  ? SYNTHROID 75 MCG tablet TAKE 1 TABLET(75 MCG) BY  MOUTH DAILY 90 tablet 3  ? vitamin B-12 (CYANOCOBALAMIN) 1000 MCG tablet Take 1,000 mcg by mouth 2 (two) times a week.    ? Zinc 20 MG CAPS Take by mouth.    ? ?No current facility-administered medications on file prior to visit.  ? ? ?BP 122/80   Pulse 68   Temp (!) 97.4 ?F (36.3 ?C) (Temporal)   Ht 4\' 11"  (1.499 m)   Wt 112 lb (50.8 kg)   SpO2 95%   BMI 22.62 kg/m?  ? ? ?   ?Objective:  ? Physical Exam ?Vitals and nursing note reviewed.  ?Constitutional:   ?   Appearance: Normal appearance.  ?Skin: ?   General: Skin is warm and dry.  ?Neurological:  ?   General: No focal deficit present.  ?   Mental Status: She is alert and oriented to person, place, and time.  ?Psychiatric:     ?   Mood and Affect: Mood normal.     ?   Behavior: Behavior normal.     ?   Thought Content: Thought content normal.     ?   Judgment: Judgment normal.  ? ?   ?Assessment & Plan:  ?1. Attention deficit hyperactivity disorder (ADHD), unspecified ADHD type ?- Place back on Adderall 20 mg daily  ?- amphetamine-dextroamphetamine (ADDERALL) 20 MG tablet; Take 1 tablet (20 mg total) by mouth 2 (two) times daily.  Dispense: 60 tablet; Refill: 0 ?- amphetamine-dextroamphetamine (ADDERALL) 20 MG tablet; Take 1 tablet (20 mg total) by mouth 2 (two) times daily.  Dispense: 60 tablet; Refill: 0 ?- amphetamine-dextroamphetamine (ADDERALL) 20 MG tablet; Take 1 tablet (20 mg total) by mouth 2 (two) times daily.  Dispense: 60 tablet; Refill: 0 ? ?[name], NP ? ?

## 2021-09-15 NOTE — Patient Instructions (Signed)
Health Maintenance Due  ?Topic Date Due  ? COVID-19 Vaccine (1) Never done  ? ? ?Depression screen [facility] 2/9 08/17/2021 12/03/2020 02/05/2020  ?Decreased Interest 0 0 0  ?Down, Depressed, Hopeless 0 0 0  ?PHQ - 2 Score 0 0 0  ?Altered sleeping 0 - -  ?Tired, decreased energy 0 - -  ?Change in appetite 0 - -  ?Feeling bad or failure about yourself  0 - -  ?Trouble concentrating 3 - -  ?Moving slowly or fidgety/restless 0 - -  ?PHQ-9 Score 3 - -  ?Difficult doing work/chores Not difficult at all - -  ? ? ?

## 2021-09-27 ENCOUNTER — Other Ambulatory Visit: Payer: Self-pay | Admitting: Adult Health

## 2021-09-27 DIAGNOSIS — E782 Mixed hyperlipidemia: Secondary | ICD-10-CM

## 2021-09-28 IMAGING — MG MM DIGITAL SCREENING BILAT W/ TOMO AND CAD
8 series · 9 of 24 positions shown · non-contrast
Comparison: Previous exam(s).

CLINICAL DATA: Screening.

EXAM:
DIGITAL SCREENING BILATERAL MAMMOGRAM WITH TOMOSYNTHESIS AND CAD
TECHNIQUE: Bilateral screening digital craniocaudal and mediolateral oblique
mammograms were obtained. Bilateral screening digital breast
tomosynthesis was performed. The images were evaluated with
computer-aided detection.

[R MLO synth-2D]
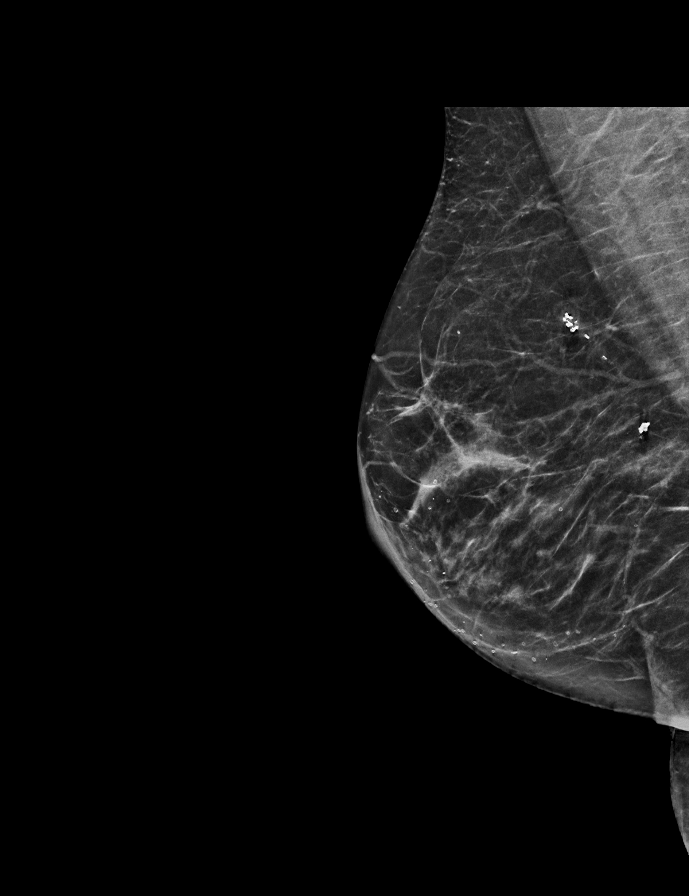

[R CC synth-2D]
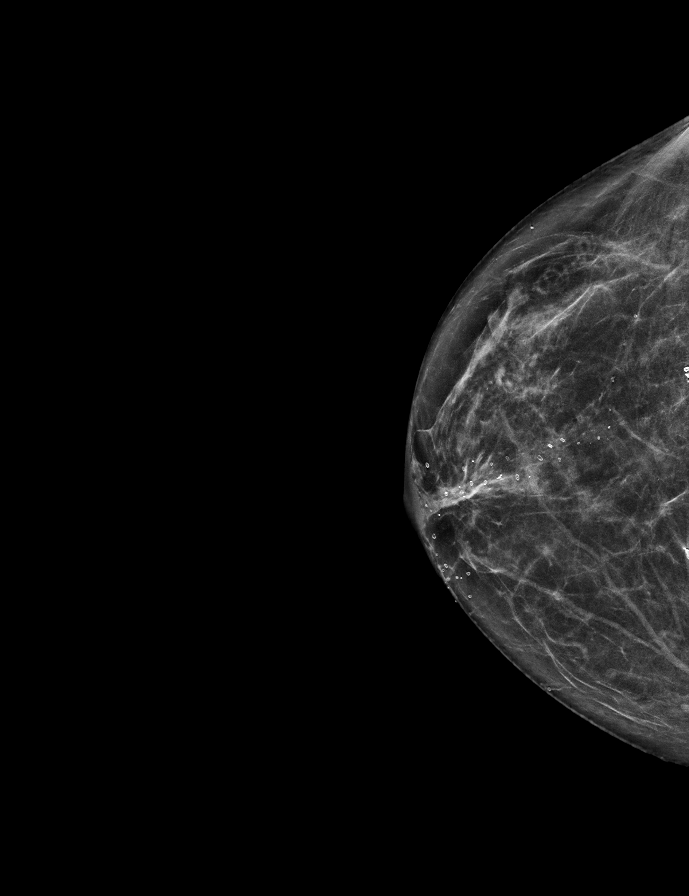

[L CC synth-2D]
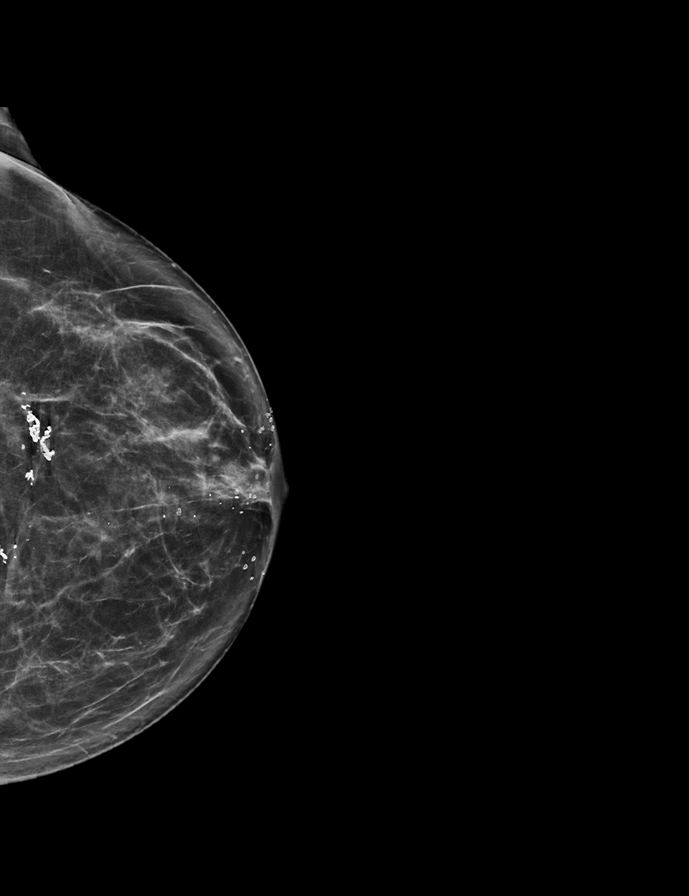

[L MLO synth-2D]
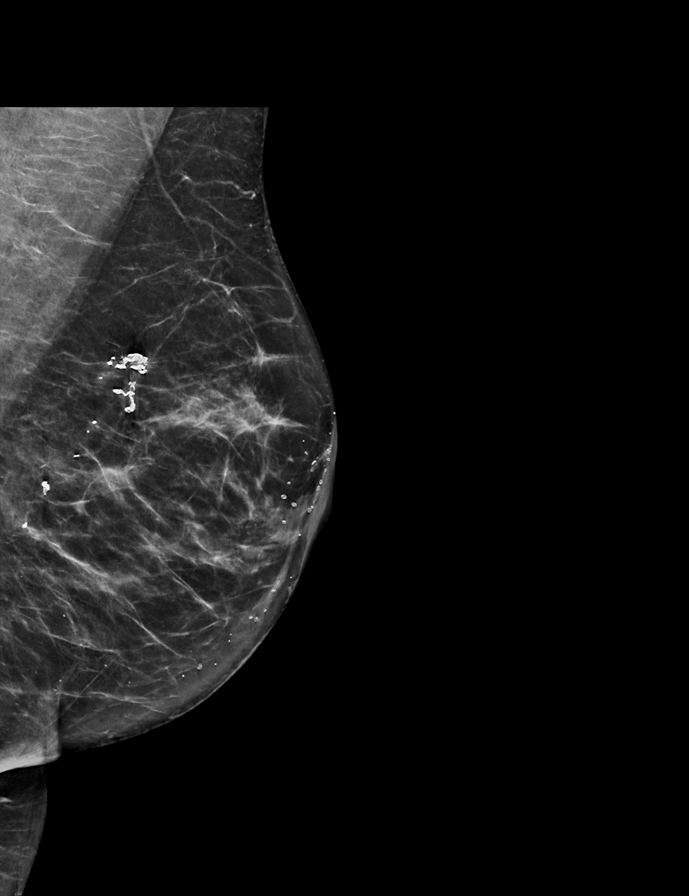

[R CC tomo · 2 of 59 frames shown]
[frame 20/59]
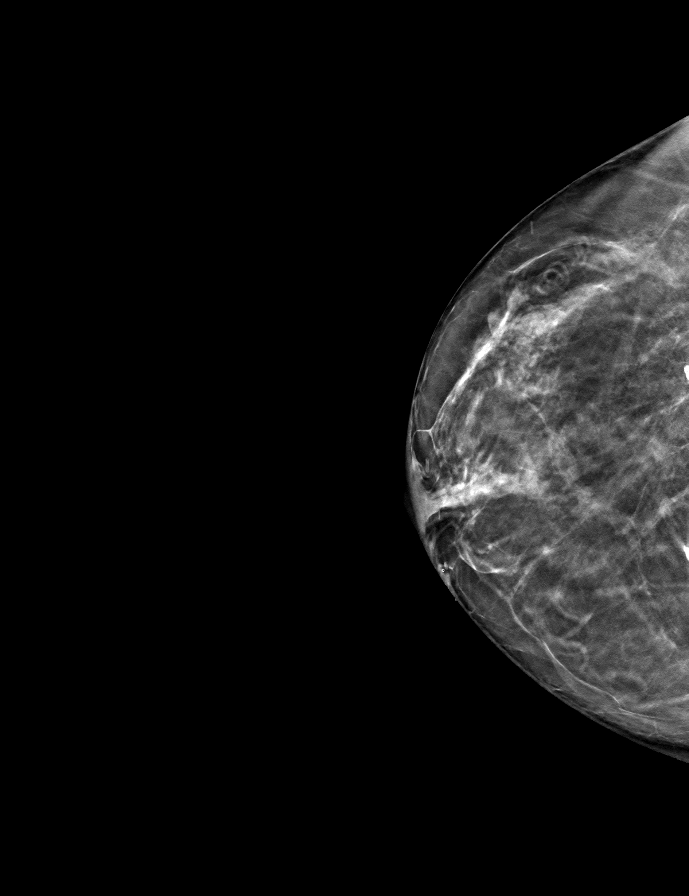
[frame 30/59]
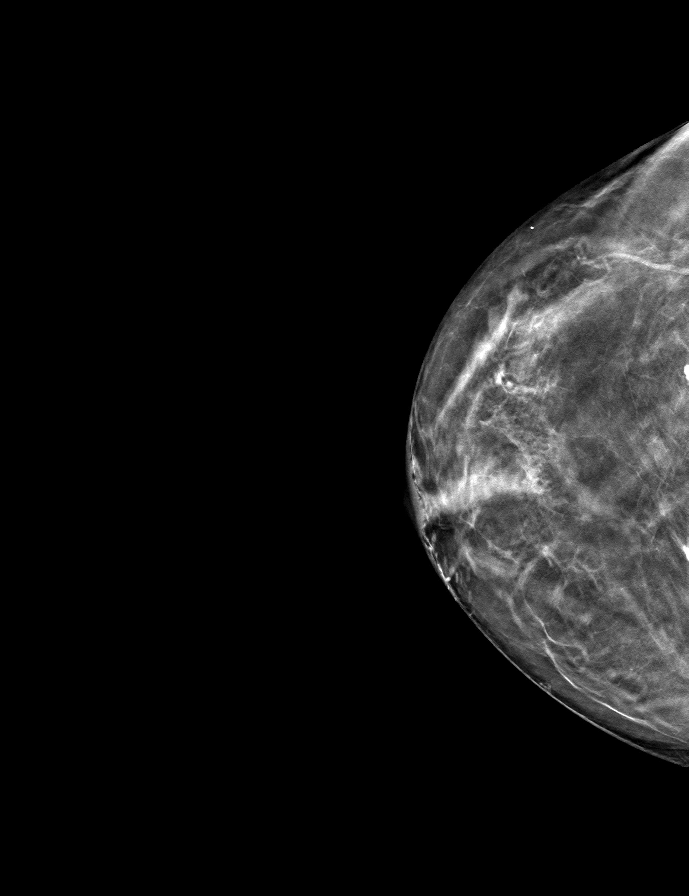

[R MLO tomo · tomo slice 31/60.0]
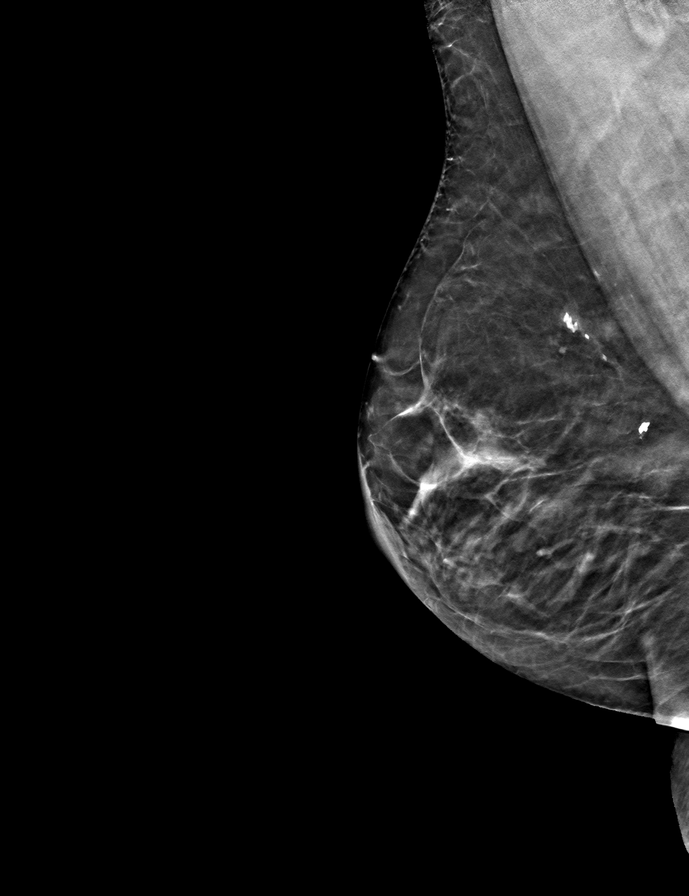

[L MLO tomo · tomo slice 33/64.0]
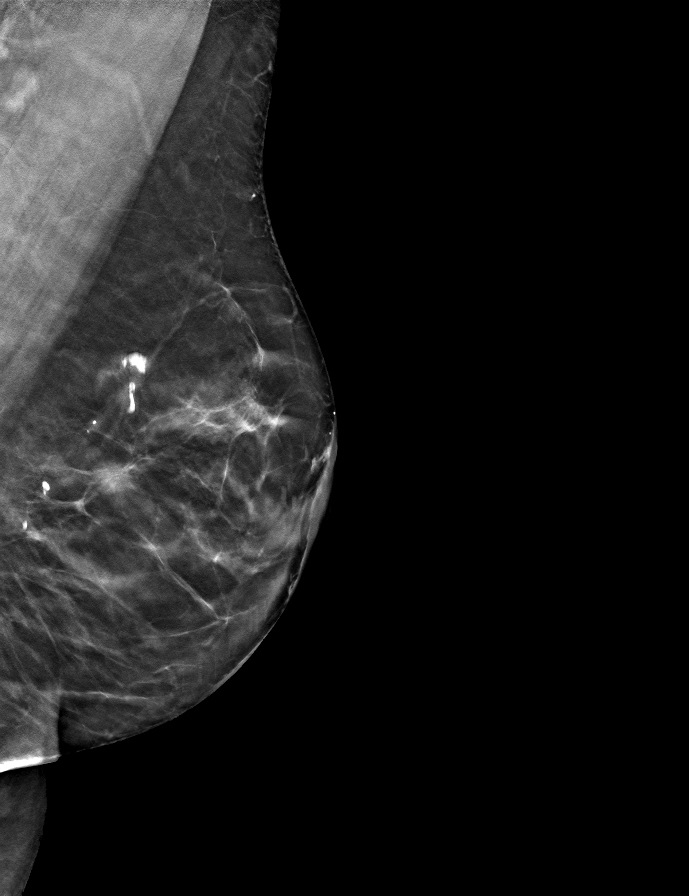

[L CC tomo · tomo slice 33/65.0]
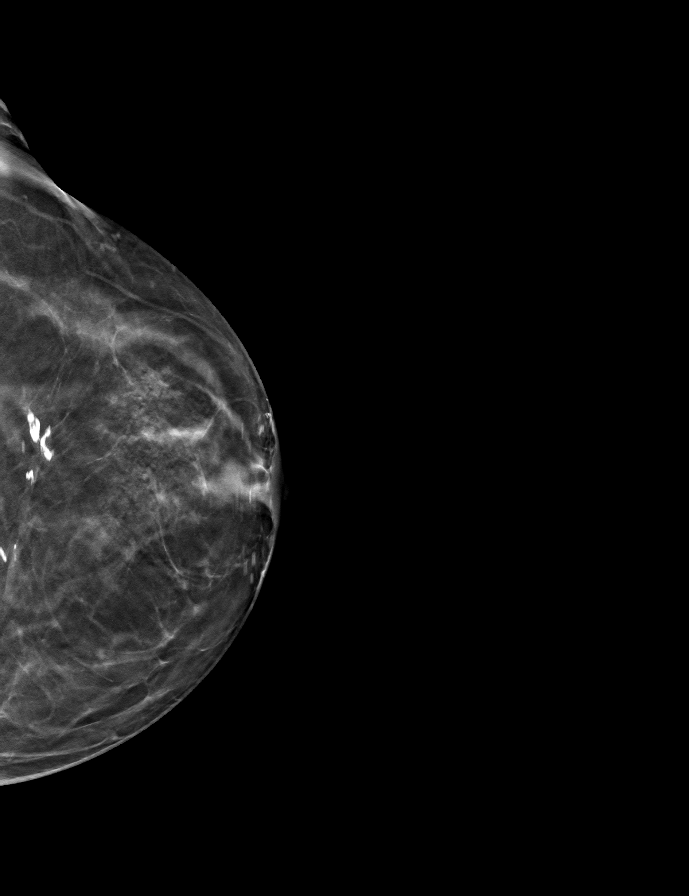

[9 of 24 positions shown; findings below may reference images not displayed]

ACR Breast Density Category b: There are scattered areas of
fibroglandular density.
FINDINGS: There are no findings suspicious for malignancy.
IMPRESSION: No mammographic evidence of malignancy. A result letter of this
screening mammogram will be mailed directly to the patient.

RECOMMENDATION:
Screening mammogram in one year. (Code:51-O-LD2)

BI-RADS CATEGORY  1: Negative.

## 2021-11-02 DIAGNOSIS — H00024 Hordeolum internum left upper eyelid: Secondary | ICD-10-CM | POA: Diagnosis not present

## 2021-12-02 ENCOUNTER — Telehealth: Payer: Self-pay | Admitting: Adult Health

## 2021-12-02 NOTE — Telephone Encounter (Signed)
Spoke to patient to schedule Medicare Annual Wellness Visit (AWV) either virtually or in office.   She is on vacation and would like a call back 5/22   Last AWV 12/03/20 ; please schedule at anytime with [facility] Nurse Health Advisor 1 or 2

## 2021-12-14 ENCOUNTER — Ambulatory Visit (INDEPENDENT_AMBULATORY_CARE_PROVIDER_SITE_OTHER): Payer: PPO

## 2021-12-14 VITALS — BP 132/82 | HR 95 | Temp 98.0°F | Ht 59.5 in | Wt 109.7 lb

## 2021-12-14 DIAGNOSIS — Z0000 Encounter for general adult medical examination without abnormal findings: Secondary | ICD-10-CM

## 2021-12-14 NOTE — Patient Instructions (Signed)
[name] , Thank you for taking time to come for your Medicare Wellness Visit. I appreciate your ongoing commitment to your health goals. Please review the following plan we discussed and let me know if I can assist you in the future.   Screening recommendations/referrals: Colonoscopy: completed 10/11/2013 Mammogram: completed 02/05/2021, due 02/06/2022 Bone Density: completed 02/12/2019 Recommended yearly ophthalmology/optometry visit for glaucoma screening and checkup Recommended yearly dental visit for hygiene and checkup  Vaccinations: Influenza vaccine: due 02/15/2022 Pneumococcal vaccine: completed 08/18/2015 Tdap vaccine: completed 01/27/2016, due 01/26/2026 Shingles vaccine: completed   Covid-19: decline  Advanced directives: Please bring a copy of your POA (Power of [place]) and/or Living Will to your next appointment.    Conditions/risks identified: none  Next appointment: Follow up in one year for your annual wellness visit    Preventive Care 65 Years and Older, Female Preventive care refers to lifestyle choices and visits with your health care provider that can promote health and wellness. What does preventive care include? A yearly physical exam. This is also called an annual well check. Dental exams once or twice a year. Routine eye exams. Ask your health care provider how often you should have your eyes checked. Personal lifestyle choices, including: Daily care of your teeth and gums. Regular physical activity. Eating a healthy diet. Avoiding tobacco and drug use. Limiting alcohol use. Practicing safe sex. Taking low-dose aspirin every day. Taking vitamin and mineral supplements as recommended by your health care provider. What happens during an annual well check? The services and screenings done by your health care provider during your annual well check will depend on your age, overall health, lifestyle risk factors, and family history of disease. Counseling   Your health care provider may ask you questions about your: Alcohol use. Tobacco use. Drug use. Emotional well-being. Home and relationship well-being. Sexual activity. Eating habits. History of falls. Memory and ability to understand (cognition). Work and work Statistician. Reproductive health. Screening  You may have the following tests or measurements: Height, weight, and BMI. Blood pressure. Lipid and cholesterol levels. These may be checked every 5 years, or more frequently if you are over 66 years old. Skin check. Lung cancer screening. You may have this screening every year starting at age 47 if you have a 30-pack-year history of smoking and currently smoke or have quit within the past 15 years. Fecal occult blood test (FOBT) of the stool. You may have this test every year starting at age 64. Flexible sigmoidoscopy or colonoscopy. You may have a sigmoidoscopy every 5 years or a colonoscopy every 10 years starting at age 70. Hepatitis C blood test. Hepatitis B blood test. Sexually transmitted disease (STD) testing. Diabetes screening. This is done by checking your blood sugar (glucose) after you have not eaten for a while (fasting). You may have this done every 1-3 years. Bone density scan. This is done to screen for osteoporosis. You may have this done starting at age 6. Mammogram. This may be done every 1-2 years. Talk to your health care provider about how often you should have regular mammograms. Talk with your health care provider about your test results, treatment options, and if necessary, the need for more tests. Vaccines  Your health care provider may recommend certain vaccines, such as: Influenza vaccine. This is recommended every year. Tetanus, diphtheria, and acellular pertussis (Tdap, Td) vaccine. You may need a Td booster every 10 years. Zoster vaccine. You may need this after age 27. Pneumococcal 13-valent conjugate (PCV13) vaccine. One  dose is recommended  after age 67. Pneumococcal polysaccharide (PPSV23) vaccine. One dose is recommended after age 37. Talk to your health care provider about which screenings and vaccines you need and how often you need them. This information is not intended to replace advice given to you by your health care provider. Make sure you discuss any questions you have with your health care provider. Document Released: 07/31/2015 Document Revised: 03/23/2016 Document Reviewed: 05/05/2015 Elsevier Interactive Patient Education  2017 [employer] Prevention in the Home Falls can cause injuries. They can happen to people of all ages. There are many things you can do to make your home safe and to help prevent falls. What can I do on the outside of my home? Regularly fix the edges of walkways and driveways and fix any cracks. Remove anything that might make you trip as you walk through a door, such as a raised step or threshold. Trim any bushes or trees on the path to your home. Use bright outdoor lighting. Clear any walking paths of anything that might make someone trip, such as rocks or tools. Regularly check to see if handrails are loose or broken. Make sure that both sides of any steps have handrails. Any raised decks and porches should have guardrails on the edges. Have any leaves, snow, or ice cleared regularly. Use sand or salt on walking paths during winter. Clean up any spills in your garage right away. This includes oil or grease spills. What can I do in the bathroom? Use night lights. Install grab bars by the toilet and in the tub and shower. Do not use towel bars as grab bars. Use non-skid mats or decals in the tub or shower. If you need to sit down in the shower, use a plastic, non-slip stool. Keep the floor dry. Clean up any water that spills on the floor as soon as it happens. Remove soap buildup in the tub or shower regularly. Attach bath mats securely with double-sided non-slip rug tape. Do not  have throw rugs and other things on the floor that can make you trip. What can I do in the bedroom? Use night lights. Make sure that you have a light by your bed that is easy to reach. Do not use any sheets or blankets that are too big for your bed. They should not hang down onto the floor. Have a firm chair that has side arms. You can use this for support while you get dressed. Do not have throw rugs and other things on the floor that can make you trip. What can I do in the kitchen? Clean up any spills right away. Avoid walking on wet floors. Keep items that you use a lot in easy-to-reach places. If you need to reach something above you, use a strong step stool that has a grab bar. Keep electrical cords out of the way. Do not use floor polish or wax that makes floors slippery. If you must use wax, use non-skid floor wax. Do not have throw rugs and other things on the floor that can make you trip. What can I do with my stairs? Do not leave any items on the stairs. Make sure that there are handrails on both sides of the stairs and use them. Fix handrails that are broken or loose. Make sure that handrails are as long as the stairways. Check any carpeting to make sure that it is firmly attached to the stairs. Fix any carpet that is loose or worn.  Avoid having throw rugs at the top or bottom of the stairs. If you do have throw rugs, attach them to the floor with carpet tape. Make sure that you have a light switch at the top of the stairs and the bottom of the stairs. If you do not have them, ask someone to add them for you. What else can I do to help prevent falls? Wear shoes that: Do not have high heels. Have rubber bottoms. Are comfortable and fit you well. Are closed at the toe. Do not wear sandals. If you use a stepladder: Make sure that it is fully opened. Do not climb a closed stepladder. Make sure that both sides of the stepladder are locked into place. Ask someone to hold it for  you, if possible. Clearly mark and make sure that you can see: Any grab bars or handrails. First and last steps. Where the edge of each step is. Use tools that help you move around (mobility aids) if they are needed. These include: Canes. Walkers. Scooters. Crutches. Turn on the lights when you go into a dark area. Replace any light bulbs as soon as they burn out. Set up your furniture so you have a clear path. Avoid moving your furniture around. If any of your floors are uneven, fix them. If there are any pets around you, be aware of where they are. Review your medicines with your doctor. Some medicines can make you feel dizzy. This can increase your chance of falling. Ask your doctor what other things that you can do to help prevent falls. This information is not intended to replace advice given to you by your health care provider. Make sure you discuss any questions you have with your health care provider. Document Released: 04/30/2009 Document Revised: 12/10/2015 Document Reviewed: 08/08/2014 Elsevier Interactive Patient Education  2017 [employer].

## 2021-12-14 NOTE — Progress Notes (Signed)
Subjective:   Gabrielle Vargas is a 71 y.o. female who presents for Medicare Annual (Subsequent) preventive examination.  Review of Systems     Cardiac Risk Factors include: advanced age (>53mn, >>73women)     Objective:    Today's Vitals   12/14/21 0906  BP: 132/82  Pulse: 95  Temp: 98 F (36.7 C)  TempSrc: Oral  SpO2: 95%  Weight: 109 lb 11.2 oz (49.8 kg)  Height: 4' 11.5" (1.511 m)   Body mass index is 21.79 kg/m.     12/14/2021    9:21 AM 12/03/2020    9:51 AM 10/16/2017    9:12 AM  Advanced Directives  Does Patient Have a Medical Advance Directive? Yes Yes Yes  Type of AParamedicof AHancockLiving will HSullivanLiving will Living will;Healthcare Power of Attorney  Does patient want to make changes to medical advance directive?  No - Patient declined No - Patient declined  Copy of HJim Wellsin Chart? No - copy requested No - copy requested No - copy requested  Would patient like information on creating a medical advance directive?   No - Patient declined    Current Medications (verified) Outpatient Encounter Medications as of 12/14/2021  Medication Sig   Acetylcysteine (NAC) 500 MG CAPS Take by mouth.   amphetamine-dextroamphetamine (ADDERALL) 20 MG tablet Take 1 tablet (20 mg total) by mouth 2 (two) times daily.   Ascorbic Acid (VITAMIN C) 1000 MG tablet Take 1,000 mg by mouth daily.   Cholecalciferol (D3-50 PO) Take by mouth.   ezetimibe (ZETIA) 10 MG tablet Take 1 tablet (10 mg total) by mouth daily.   hydrochlorothiazide (HYDRODIURIL) 25 MG tablet TAKE 1 TABLET(25 MG) BY MOUTH DAILY   ibuprofen (ADVIL,MOTRIN) 200 MG tablet Take 200 mg by mouth every 6 (six) hours as needed.   simvastatin (ZOCOR) 20 MG tablet TAKE 1 TABLET(20 MG) BY MOUTH EVERY OTHER DAY   SYNTHROID 75 MCG tablet TAKE 1 TABLET(75 MCG) BY MOUTH DAILY   vitamin B-12 (CYANOCOBALAMIN) 1000 MCG tablet Take 1,000 mcg by mouth 2 (two)  times a week.   Zinc 20 MG CAPS Take by mouth.   amphetamine-dextroamphetamine (ADDERALL) 20 MG tablet Take 1 tablet (20 mg total) by mouth 2 (two) times daily.   amphetamine-dextroamphetamine (ADDERALL) 20 MG tablet Take 1 tablet (20 mg total) by mouth 2 (two) times daily.   No facility-administered encounter medications on file as of 12/14/2021.    Allergies (verified) Latex and Oxycodone-acetaminophen   History: Past Medical History:  Diagnosis Date   Arthritis    Osteo   Asthma    Seasonal   Diverticula, colon    Hyperlipidemia    Hypertension    Hypothyroidism    Past Surgical History:  Procedure Laterality Date   ABDOMINAL HYSTERECTOMY     BREAST SURGERY     CARPAL TUNNEL RELEASE Bilateral    CERVICAL DISC ARTHROPLASTY     CESAREAN SECTION     x2   left shoulder surgery     MENISCUS REPAIR Right 2017   REDUCTION MAMMAPLASTY Bilateral    right shoulder     TONSILLECTOMY     Family History  Problem Relation Age of Onset   Kidney disease Father    Diabetes Father    Heart disease Father    Melanoma Mother    Colon cancer Neg Hx    Esophageal cancer Neg Hx    Rectal cancer Neg Hx  Stomach cancer Neg Hx    Social History   Socioeconomic History   Marital status: Married    Spouse name: Not on file   Number of children: 2   Years of education: Not on file   Highest education level: Not on file  Occupational History   Occupation: Retired  Tobacco Use   Smoking status: Never   Smokeless tobacco: Never  Vaping Use   Vaping Use: Never used  Substance and Sexual Activity   Alcohol use: No   Drug use: No   Sexual activity: Not on file  Other Topics Concern   Not on file  Social History Narrative   Retired   Married      Social Determinants of Radio broadcast assistant Strain: Low Risk    Difficulty of Paying Living Expenses: Not hard at all  Food Insecurity: No Food Insecurity   Worried About Charity fundraiser in the Last Year: Never true    Arboriculturist in the Last Year: Never true  Transportation Needs: No Transportation Needs   Lack of Transportation (Medical): No   Lack of Transportation (Non-Medical): No  Physical Activity: Sufficiently Active   Days of Exercise per Week: 6 days   Minutes of Exercise per Session: 90 min  Stress: No Stress Concern Present   Feeling of Stress : Not at all  Social Connections: Not on file    Tobacco Counseling Counseling given: Not Answered   Clinical Intake:  Pre-visit preparation completed: Yes  Pain : No/denies pain     Nutritional Status: BMI of 19-24  Normal Nutritional Risks: None Diabetes: No  How often do you need to have someone help you when you read instructions, pamphlets, or other written materials from your doctor or pharmacy?: 1 - Never What is the last grade level you completed in school?: some college  Diabetic? no  Interpreter Needed?: No  Information entered by :: NAllen LPN   Activities of Daily Living    12/14/2021    9:22 AM  In your present state of health, do you have any difficulty performing the following activities:  Hearing? 0  Vision? 0  Difficulty concentrating or making decisions? 0  Walking or climbing stairs? 0  Dressing or bathing? 0  Doing errands, shopping? 0  Preparing Food and eating ? N  Using the Toilet? N  In the past six months, have you accidently leaked urine? N  Do you have problems with loss of bowel control? N  Managing your Medications? N  Managing your Finances? N    Patient Care Team: [name], NP as PCP - General (Family Medicine) Viona Gilmore, [facility] as Pharmacist (Pharmacist)  Indicate any recent Medical Services you may have received from other than [facility] providers in the past year (date may be approximate).     Assessment:   This is a routine wellness examination for [place].  Hearing/Vision screen Vision Screening - Comments:: Regular eye exams, [facility]  Dietary issues and  exercise activities discussed: Current Exercise Habits: Home exercise routine, Type of exercise: walking, Time (Minutes): > 60, Frequency (Times/Week): 6, Weekly Exercise (Minutes/Week): 0   Goals Addressed             This Visit's Progress    Patient Stated       12/14/2021, stay healthy       Depression Screen    12/14/2021    9:22 AM 08/17/2021    8:55 AM 12/03/2020  9:53 AM 02/05/2020    8:30 AM 10/09/2017    9:04 AM 09/19/2016    1:11 PM 08/18/2015    3:02 PM  PHQ 2/9 Scores  PHQ - 2 Score 0 0 0 0 0 0 0  PHQ- 9 Score  3         Fall Risk    12/14/2021    9:22 AM 08/17/2021    9:02 AM 12/03/2020    9:52 AM 02/05/2020    8:30 AM 10/09/2017    9:04 AM  Fall Risk   Falls in the past year? 0 0 0 0 No  Number falls in past yr: 0 0 0    Injury with Fall? 0 0 0    Risk for fall due to : Medication side effect      Follow up Falls evaluation completed;Education provided;Falls prevention discussed  Falls evaluation completed      FALL RISK PREVENTION PERTAINING TO THE HOME:  Any stairs in or around the home? Yes  If so, are there any without handrails? No  Home free of loose throw rugs in walkways, pet beds, electrical cords, etc? Yes  Adequate lighting in your home to reduce risk of falls? Yes   ASSISTIVE DEVICES UTILIZED TO PREVENT FALLS:  Life alert? No  Use of a cane, walker or w/c? No  Grab bars in the bathroom? No  Shower chair or bench in shower? No  Elevated toilet seat or a handicapped toilet? Yes   TIMED UP AND GO:  Was the test performed? No .    Gait steady and fast without use of assistive device  Cognitive Function:        12/14/2021    9:26 AM  6CIT Screen  What Year? 0 points  What month? 0 points  What time? 0 points  Count back from 20 0 points  Months in reverse 0 points  Repeat phrase 0 points  Total Score 0 points    Immunizations Immunization History  Administered Date(s) Administered   Influenza Split 05/08/2012, 04/01/2013    Influenza Whole 05/09/2007, 04/22/2008, 04/17/2009, 04/19/2010   Influenza, High Dose Seasonal PF 04/29/2017, 04/19/2018, 04/17/2020, 04/21/2021   Influenza-Unspecified 03/19/2014, 03/31/2015   Pneumococcal Conjugate-13 10/16/2013   Pneumococcal Polysaccharide-23 08/18/2015   Td 01/03/2005   Tdap 01/27/2016   Zoster Recombinat (Shingrix) 02/10/2021, 07/26/2021   Zoster, Live 03/31/2015    TDAP status: Up to date  Flu Vaccine status: Up to date  Pneumococcal vaccine status: Up to date  Covid-19 vaccine status: Declined, Education has been provided regarding the importance of this vaccine but patient still declined. Advised may receive this vaccine at local pharmacy or Health Dept.or vaccine clinic. Aware to provide a copy of the vaccination record if obtained from local pharmacy or Health Dept. Verbalized acceptance and understanding.  Qualifies for Shingles Vaccine? Yes   Zostavax completed Yes   Shingrix Completed?: Yes  Screening Tests Health Maintenance  Topic Date Due   COVID-19 Vaccine (1) 12/30/2021 (Originally 04/06/1951)   MAMMOGRAM  02/05/2022   INFLUENZA VACCINE  02/15/2022   COLONOSCOPY (Pts 45-83yr Insurance coverage will need to be confirmed)  10/12/2023   TETANUS/TDAP  01/26/2026   Pneumonia Vaccine 71 Years old  Completed   DEXA SCAN  Completed   Hepatitis C Screening  Completed   Zoster Vaccines- Shingrix  Completed   HPV VACCINES  Aged Out    Health Maintenance  There are no preventive care reminders to display for this patient.  Colorectal cancer screening: Type of screening: Colonoscopy. Completed 10/11/2013. Repeat every 10 years  Mammogram status: Completed 02/05/2021. Repeat every year  Bone Density status: Completed 02/12/2019.  Lung Cancer Screening: (Low Dose CT Chest recommended if Age 46-80 years, 30 pack-year currently smoking OR have quit w/in 15years.) does not qualify.   Lung Cancer Screening Referral: no  Additional  Screening:  Hepatitis C Screening: does qualify; Completed 02/05/2020  Vision Screening: Recommended annual ophthalmology exams for early detection of glaucoma and other disorders of the eye. Is the patient up to date with their annual eye exam?  Yes  Who is the provider or what is the name of the office in which the patient attends annual eye exams? [facility] If pt is not established with a provider, would they like to be referred to a provider to establish care? No .   Dental Screening: Recommended annual dental exams for proper oral hygiene  Community Resource Referral / Chronic Care Management: CRR required this visit?  No   CCM required this visit?  No      Plan:     I have personally reviewed and noted the following in the patient's chart:   Medical and social history Use of alcohol, tobacco or illicit drugs  Current medications and supplements including opioid prescriptions.  Functional ability and status Nutritional status Physical activity Advanced directives List of other physicians Hospitalizations, surgeries, and ER visits in previous 12 months Vitals Screenings to include cognitive, depression, and falls Referrals and appointments  In addition, I have reviewed and discussed with patient certain preventive protocols, quality metrics, and best practice recommendations. A written personalized care plan for preventive services as well as general preventive health recommendations were provided to patient.     Kellie Simmering, LPN   [id number]   Nurse Notes: none

## 2021-12-20 ENCOUNTER — Other Ambulatory Visit: Payer: Self-pay | Admitting: Adult Health

## 2022-01-26 ENCOUNTER — Other Ambulatory Visit: Payer: Self-pay | Admitting: Adult Health

## 2022-02-22 ENCOUNTER — Telehealth: Payer: Self-pay

## 2022-02-22 DIAGNOSIS — F909 Attention-deficit hyperactivity disorder, unspecified type: Secondary | ICD-10-CM

## 2022-02-22 NOTE — Telephone Encounter (Signed)
Pt called into the office stating adderall is on back order she stated it will need to be re sent

## 2022-02-22 NOTE — Telephone Encounter (Signed)
Please advise 

## 2022-02-23 NOTE — Telephone Encounter (Signed)
[place]

## 2022-02-23 NOTE — Telephone Encounter (Signed)
Left message to return phone call.

## 2022-02-23 NOTE — Telephone Encounter (Signed)
Pt returned CMA's call. CMA was unavailable. Please call Pt back when you can. Thank you.

## 2022-02-24 MED ORDER — AMPHETAMINE-DEXTROAMPHETAMINE 20 MG PO TABS: 20.0000 mg | ORAL_TABLET | Freq: Two times a day (BID) | ORAL | 0 refills | Status: DC

## 2022-02-24 NOTE — Addendum Note (Signed)
Addended by: Apolinar Junes on: 02/24/2022 06:53 AM   Modules accepted: Orders

## 2022-03-28 ENCOUNTER — Other Ambulatory Visit: Payer: Self-pay | Admitting: Adult Health

## 2022-03-28 DIAGNOSIS — Z1231 Encounter for screening mammogram for malignant neoplasm of breast: Secondary | ICD-10-CM

## 2022-04-04 DIAGNOSIS — L570 Actinic keratosis: Secondary | ICD-10-CM | POA: Diagnosis not present

## 2022-04-04 DIAGNOSIS — H61031 Chondritis of right external ear: Secondary | ICD-10-CM | POA: Diagnosis not present

## 2022-04-04 DIAGNOSIS — L578 Other skin changes due to chronic exposure to nonionizing radiation: Secondary | ICD-10-CM | POA: Diagnosis not present

## 2022-04-04 DIAGNOSIS — D225 Melanocytic nevi of trunk: Secondary | ICD-10-CM | POA: Diagnosis not present

## 2022-04-04 DIAGNOSIS — L821 Other seborrheic keratosis: Secondary | ICD-10-CM | POA: Diagnosis not present

## 2022-04-06 ENCOUNTER — Ambulatory Visit
Admission: RE | Admit: 2022-04-06 | Discharge: 2022-04-06 | Disposition: A | Discharge status: Home / Self Care | Payer: PPO | Source: Ambulatory Visit | Attending: Adult Health | Admitting: Adult Health

## 2022-04-06 DIAGNOSIS — Z1231 Encounter for screening mammogram for malignant neoplasm of breast: Secondary | ICD-10-CM | POA: Diagnosis not present

## 2022-05-12 ENCOUNTER — Telehealth: Payer: Self-pay | Admitting: Adult Health

## 2022-05-12 NOTE — Telephone Encounter (Signed)
Pt is calling and would like a refill on amphetamine-dextroamphetamine (ADDERALL) 20 MG tablet  [id number] - [address], [place] AT [facility] [address] Phone:  [phone]  Fax:  [phone]

## 2022-05-19 ENCOUNTER — Other Ambulatory Visit: Payer: Self-pay | Admitting: Adult Health

## 2022-05-19 DIAGNOSIS — F909 Attention-deficit hyperactivity disorder, unspecified type: Secondary | ICD-10-CM

## 2022-05-19 MED ORDER — AMPHETAMINE-DEXTROAMPHETAMINE 20 MG PO TABS: 20.0000 mg | ORAL_TABLET | Freq: Two times a day (BID) | ORAL | 0 refills | Status: DC

## 2022-05-19 NOTE — Telephone Encounter (Signed)
Patient informed and verbalized understanding

## 2022-05-27 ENCOUNTER — Encounter: Payer: Self-pay | Admitting: Adult Health

## 2022-05-27 ENCOUNTER — Ambulatory Visit (INDEPENDENT_AMBULATORY_CARE_PROVIDER_SITE_OTHER): Payer: PPO | Admitting: Adult Health

## 2022-05-27 VITALS — BP 140/80 | HR 64 | Temp 97.8°F | Ht 59.5 in | Wt 110.0 lb

## 2022-05-27 DIAGNOSIS — E782 Mixed hyperlipidemia: Secondary | ICD-10-CM | POA: Diagnosis not present

## 2022-05-27 DIAGNOSIS — I10 Essential (primary) hypertension: Secondary | ICD-10-CM

## 2022-05-27 DIAGNOSIS — Z0000 Encounter for general adult medical examination without abnormal findings: Secondary | ICD-10-CM

## 2022-05-27 DIAGNOSIS — E039 Hypothyroidism, unspecified: Secondary | ICD-10-CM | POA: Diagnosis not present

## 2022-05-27 DIAGNOSIS — F909 Attention-deficit hyperactivity disorder, unspecified type: Secondary | ICD-10-CM | POA: Diagnosis not present

## 2022-05-27 MED ORDER — AMPHETAMINE-DEXTROAMPHETAMINE 20 MG PO TABS: 20.0000 mg | ORAL_TABLET | Freq: Two times a day (BID) | ORAL | 0 refills | Status: DC

## 2022-05-27 NOTE — Progress Notes (Signed)
Subjective:    Patient ID: Gabrielle Vargas, female    DOB: Apr 29, 1951, 71 y.o.   MRN: [id number]  HPI Patient presents for yearly preventative medicine examination. She is a pleasant 72 year old female who  has a past medical history of Arthritis, Asthma, Diverticula, colon, Hyperlipidemia, Hypertension, and Hypothyroidism.  Hypertension-managed with HCTZ 25 mg daily.  She denies dizziness, lightheadedness, chest pain, shortness of breath  BP Readings from Last 3 Encounters:  05/27/22 (!) 140/80  12/14/21 132/82  09/15/21 122/80   Hyperlipidemia-managed with simvastatin 20 mg every other day and zetia 10 mg daily. .  She denies any side effects when she takes her medication this way.  Lab Results  Component Value Date   CHOL 201 (H) 02/10/2021   HDL 61.10 02/10/2021   LDLCALC 107 (H) 02/10/2021   LDLDIRECT 158.7 06/19/2014   TRIG 164.0 (H) 02/10/2021   CHOLHDL 3 02/10/2021   Hypothyroidism-currently managed with Synthroid 75 mcg daily.  She does feel well on this dose and no signs of hypo or hyperthyroidism  ADHD-managed with Adderall 20 mg twice daily.  Does not take the medication all the time during the weekends.  She does feel well controlled during the week when she does take it  All immunizations and health maintenance protocols were reviewed with the patient and needed orders were placed.  Appropriate screening laboratory values were ordered for the patient including screening of hyperlipidemia, renal function and hepatic function.  Medication reconciliation,  past medical history, social history, problem list and allergies were reviewed in detail with the patient  Goals were established with regard to weight loss, exercise, and  diet in compliance with medications. She eats healthy and exercises daily.   She is up-to-date on routine colon cancer screening as well as mammograms.  Review of Systems  Constitutional: Negative.   HENT: Negative.    Eyes: Negative.    Respiratory: Negative.    Cardiovascular: Negative.   Gastrointestinal: Negative.   Endocrine: Negative.   Genitourinary: Negative.   Musculoskeletal: Negative.   Skin: Negative.   Allergic/Immunologic: Negative.   Neurological: Negative.   Hematological: Negative.   Psychiatric/Behavioral: Negative.     Past Medical History:  Diagnosis Date   Arthritis    Osteo   Asthma    Seasonal   Diverticula, colon    Hyperlipidemia    Hypertension    Hypothyroidism     Social History   Socioeconomic History   Marital status: Married    Spouse name: Not on file   Number of children: 2   Years of education: Not on file   Highest education level: Not on file  Occupational History   Occupation: Retired  Tobacco Use   Smoking status: Never   Smokeless tobacco: Never  Vaping Use   Vaping Use: Never used  Substance and Sexual Activity   Alcohol use: No   Drug use: No   Sexual activity: Not on file  Other Topics Concern   Not on file  Social History Narrative   Retired   Married      Social Determinants of Health   Financial Resource Strain: [place]  (12/14/2021)   Overall Financial Resource Strain (CARDIA)    Difficulty of Paying Living Expenses: Not hard at all  Food Insecurity: No [place] (12/14/2021)   Hunger Vital Sign    Worried About Running Out of Food in the Last Year: Never true    [place] in the Last  Year: Never true  Transportation Needs: No Transportation Needs (12/14/2021)   PRAPARE - Hydrologist (Medical): No    Lack of Transportation (Non-Medical): No  Physical Activity: Sufficiently Active (12/14/2021)   Exercise Vital Sign    Days of Exercise per Week: 6 days    Minutes of Exercise per Session: 90 min  Stress: No Stress Concern Present (12/14/2021)   [place]    Feeling of Stress : Not at all  Social Connections: Moderately Integrated  (12/03/2020)   Social Connection and Isolation Panel [NHANES]    Frequency of Communication with Friends and Family: Three times a week    Frequency of Social Gatherings with Friends and Family: Three times a week    Attends Religious Services: More than 4 times per year    Active Member of Clubs or Organizations: No    Attends Archivist Meetings: Never    Marital Status: Married  Human resources officer Violence: Not At Risk (12/03/2020)   Humiliation, Afraid, Rape, and Kick questionnaire    Fear of Current or Ex-Partner: No    Emotionally Abused: No    Physically Abused: No    Sexually Abused: No    Past Surgical History:  Procedure Laterality Date   ABDOMINAL HYSTERECTOMY     BREAST SURGERY     CARPAL TUNNEL RELEASE Bilateral    CERVICAL [place]     x2   left shoulder surgery     MENISCUS REPAIR Right 2017   REDUCTION MAMMAPLASTY Bilateral    right shoulder     TONSILLECTOMY      Family History  Problem Relation Age of Onset   Melanoma Mother    Kidney disease Father    Diabetes Father    Heart disease Father    Breast cancer Paternal Grandmother    Colon cancer Neg Hx    Esophageal cancer Neg Hx    Rectal cancer Neg Hx    Stomach cancer Neg Hx     Allergies  Allergen Reactions   Latex     REACTION: rash/hives   Oxycodone-Acetaminophen     REACTION: nausea    Current Outpatient Medications on File Prior to Visit  Medication Sig Dispense Refill   Acetylcysteine (NAC) 500 MG CAPS Take by mouth.     amphetamine-dextroamphetamine (ADDERALL) 20 MG tablet Take 1 tablet (20 mg total) by mouth 2 (two) times daily. 60 tablet 0   amphetamine-dextroamphetamine (ADDERALL) 20 MG tablet Take 1 tablet (20 mg total) by mouth 2 (two) times daily. 60 tablet 0   amphetamine-dextroamphetamine (ADDERALL) 20 MG tablet Take 1 tablet (20 mg total) by mouth 2 (two) times daily. 60 tablet 0   Ascorbic Acid (VITAMIN C) 1000 MG tablet Take 1,000 mg by  mouth daily.     Cholecalciferol (D3-50 PO) Take by mouth.     Evening Primrose Oil 1000 MG CAPS Take 1,300 mg by mouth in the morning and at bedtime.     ezetimibe (ZETIA) 10 MG tablet TAKE 1 TABLET(10 MG) BY MOUTH DAILY 90 tablet 3   hydrochlorothiazide (HYDRODIURIL) 25 MG tablet TAKE 1 TABLET(25 MG) BY MOUTH DAILY 90 tablet 3   ibuprofen (ADVIL,MOTRIN) 200 MG tablet Take 200 mg by mouth every 6 (six) hours as needed.     simvastatin (ZOCOR) 20 MG tablet TAKE 1 TABLET(20 MG) BY MOUTH EVERY OTHER DAY 45 tablet 3  SYNTHROID 75 MCG tablet TAKE 1 TABLET(75 MCG) BY MOUTH DAILY 90 tablet 3   vitamin B-12 (CYANOCOBALAMIN) 1000 MCG tablet Take 1,000 mcg by mouth 2 (two) times a week.     Zinc 20 MG CAPS Take by mouth.     No current facility-administered medications on file prior to visit.    BP (!) 140/80   Pulse 64   Temp 97.8 F (36.6 C) (Oral)   Ht 4' 11.5" (1.511 m)   Wt 110 lb (49.9 kg)   SpO2 94%   BMI 21.85 kg/m       Objective:   Physical Exam Vitals and nursing note reviewed.  Constitutional:      General: She is not in acute distress.    Appearance: Normal appearance. She is well-developed. She is not ill-appearing.  HENT:     Head: Normocephalic and atraumatic.     Right Ear: Tympanic membrane, ear canal and external ear normal. There is no impacted cerumen.     Left Ear: Tympanic membrane, ear canal and external ear normal. There is no impacted cerumen.     Nose: Nose normal. No congestion or rhinorrhea.     Mouth/Throat:     Mouth: Mucous membranes are moist.     Pharynx: Oropharynx is clear. No oropharyngeal exudate or posterior oropharyngeal erythema.  Eyes:     General:        Right eye: No discharge.        Left eye: No discharge.     Extraocular Movements: Extraocular movements intact.     Conjunctiva/sclera: Conjunctivae normal.     Pupils: Pupils are equal, round, and reactive to light.  Neck:     Thyroid: No thyromegaly.     Vascular: No carotid  bruit.     Trachea: No tracheal deviation.  Cardiovascular:     Rate and Rhythm: Normal rate and regular rhythm.     Pulses: Normal pulses.     Heart sounds: Normal heart sounds. No murmur heard.    No friction rub. No gallop.  Pulmonary:     Effort: Pulmonary effort is normal. No respiratory distress.     Breath sounds: Normal breath sounds. No stridor. No wheezing, rhonchi or rales.  Chest:     Chest wall: No tenderness.  Abdominal:     General: Abdomen is flat. Bowel sounds are normal. There is no distension.     Palpations: Abdomen is soft. There is no mass.     Tenderness: There is no abdominal tenderness. There is no right CVA tenderness, left CVA tenderness, guarding or rebound.     Hernia: No hernia is present.  Musculoskeletal:        General: No swelling, tenderness, deformity or signs of injury. Normal range of motion.     Cervical back: Normal range of motion and neck supple.     Right lower leg: No edema.     Left lower leg: No edema.  Lymphadenopathy:     Cervical: No cervical adenopathy.  Skin:    General: Skin is warm and dry.     Coloration: Skin is not jaundiced or pale.     Findings: No bruising, erythema, lesion or rash.  Neurological:     General: No focal deficit present.     Mental Status: She is alert and oriented to person, place, and time.     Cranial Nerves: No cranial nerve deficit.     Sensory: No sensory deficit.     Motor:  No weakness.     Coordination: Coordination normal.     Gait: Gait normal.     Deep Tendon Reflexes: Reflexes normal.  Psychiatric:        Mood and Affect: Mood normal.        Behavior: Behavior normal.        Thought Content: Thought content normal.        Judgment: Judgment normal.       Assessment & Plan:   1. Routine general medical examination at a health care facility - Benign exam. Remarkable 72 year old female  - CBC with Differential/Platelet; Future - Hemoglobin A1c; Future - Comprehensive metabolic panel;  Future - Lipid panel; Future - TSH; Future  2. Attention deficit hyperactivity disorder (ADHD), unspecified ADHD type  - amphetamine-dextroamphetamine (ADDERALL) 20 MG tablet; Take 1 tablet (20 mg total) by mouth 2 (two) times daily.  Dispense: 60 tablet; Refill: 0 - amphetamine-dextroamphetamine (ADDERALL) 20 MG tablet; Take 1 tablet (20 mg total) by mouth 2 (two) times daily.  Dispense: 60 tablet; Refill: 0 - amphetamine-dextroamphetamine (ADDERALL) 20 MG tablet; Take 1 tablet (20 mg total) by mouth 2 (two) times daily.  Dispense: 60 tablet; Refill: 0  3. Essential hypertension - well controlled. No change in medications  - CBC with Differential/Platelet; Future - Hemoglobin A1c; Future - Comprehensive metabolic panel; Future - Lipid panel; Future - TSH; Future  4. Mixed hyperlipidemia - Continue with statin and zetia  - CBC with Differential/Platelet; Future - Hemoglobin A1c; Future - Comprehensive metabolic panel; Future - Lipid panel; Future - TSH; Future  5. Hypothyroidism, unspecified type - Consider change in synthroid doe  - CBC with Differential/Platelet; Future - Hemoglobin A1c; Future - Comprehensive metabolic panel; Future - Lipid panel; Future - TSH; Future  [name], NP

## 2022-05-27 NOTE — Patient Instructions (Signed)
It was great seeing you today   We will follow up with you regarding your lab work   Please let me know if you need anything   

## 2022-05-28 ENCOUNTER — Other Ambulatory Visit: Payer: Self-pay

## 2022-05-28 ENCOUNTER — Emergency Department (HOSPITAL_COMMUNITY): Payer: PPO

## 2022-05-28 ENCOUNTER — Encounter (HOSPITAL_COMMUNITY): Payer: Self-pay

## 2022-05-28 ENCOUNTER — Emergency Department (HOSPITAL_COMMUNITY)
Admission: EM | Admit: 2022-05-28 | Discharge: 2022-05-28 | Discharge status: Against Medical Advice | Payer: PPO | Attending: Physician Assistant | Admitting: Physician Assistant

## 2022-05-28 DIAGNOSIS — R410 Disorientation, unspecified: Secondary | ICD-10-CM | POA: Diagnosis not present

## 2022-05-28 DIAGNOSIS — H538 Other visual disturbances: Secondary | ICD-10-CM | POA: Insufficient documentation

## 2022-05-28 DIAGNOSIS — G4751 Confusional arousals: Secondary | ICD-10-CM | POA: Insufficient documentation

## 2022-05-28 DIAGNOSIS — R4182 Altered mental status, unspecified: Secondary | ICD-10-CM | POA: Diagnosis not present

## 2022-05-28 DIAGNOSIS — Z5321 Procedure and treatment not carried out due to patient leaving prior to being seen by health care provider: Secondary | ICD-10-CM | POA: Insufficient documentation

## 2022-05-28 LAB — CBC WITH DIFFERENTIAL/PLATELET
Abs Immature Granulocytes: 0.03 10*3/uL (ref 0.00–0.07)
Basophils Absolute: 0.1 10*3/uL (ref 0.0–0.1)
Basophils Relative: 1 %
Eosinophils Absolute: 0.1 10*3/uL (ref 0.0–0.5)
Eosinophils Relative: 1 %
HCT: 43.1 % (ref 36.0–46.0)
Hemoglobin: 14.4 g/dL (ref 12.0–15.0)
Immature Granulocytes: 0 %
Lymphocytes Relative: 35 %
Lymphs Abs: 2.4 10*3/uL (ref 0.7–4.0)
MCH: 29.1 pg (ref 26.0–34.0)
MCHC: 33.4 g/dL (ref 30.0–36.0)
MCV: 87.1 fL (ref 80.0–100.0)
Monocytes Absolute: 0.3 10*3/uL (ref 0.1–1.0)
Monocytes Relative: 5 %
Neutro Abs: 4 10*3/uL (ref 1.7–7.7)
Neutrophils Relative %: 58 %
Platelets: 257 10*3/uL (ref 150–400)
RBC: 4.95 MIL/uL (ref 3.87–5.11)
RDW: 12.7 % (ref 11.5–15.5)
WBC: 6.9 10*3/uL (ref 4.0–10.5)
nRBC: 0 % (ref 0.0–0.2)

## 2022-05-28 LAB — BASIC METABOLIC PANEL
Anion gap: 12 (ref 5–15)
BUN: 12 mg/dL (ref 8–23)
CO2: 31 mmol/L (ref 22–32)
Calcium: 10 mg/dL (ref 8.9–10.3)
Chloride: 97 mmol/L — ABNORMAL LOW (ref 98–111)
Creatinine, Ser: 0.79 mg/dL (ref 0.44–1.00)
GFR, Estimated: >60 mL/min (ref >60)
Glucose, Bld: 160 mg/dL — ABNORMAL HIGH (ref 70–99)
Potassium: 3 mmol/L — ABNORMAL LOW (ref 3.5–5.1)
Sodium: 140 mmol/L (ref 135–145)

## 2022-05-28 NOTE — ED Provider Triage Note (Addendum)
Emergency Medicine Provider Triage Evaluation Note  Gabrielle Vargas , a 71 y.o. female  was evaluated in triage.  Pt complains of confusion.  Has had 3-4 similar episodes over the last 3 weeks.  Patient will began to stare into space and not speak.  Sx last for 30 to 45 minutes.  She has no facial droop.  No unilateral weakness, numbness when this occurs.  Last episode 2 hours PTA.  She ends up going back to baseline after 30-45 min.  No history of TIA, CVA.  No history of seizure-like activity.  No recent head trauma.  No illnesses. No new vision changes. Blurred vision x 1 month that she has been seeing Optho for.  No sudden onset headache.  Saw PCP yesterday, no mention of these episodes in the note from them  Review of Systems  Positive: Confusion (resolved) Negative:   Physical Exam  BP (!) 158/91   Pulse 84   Temp (!) 97.5 F (36.4 C)   Resp 18   SpO2 99%  Gen:   Awake, no distress   Resp:  Normal effort  MSK:   Moves extremities without difficulty  Neuro:  Cn 2-12 grossly intact, equal hand grip, no weakness, numbness Other:    Medical Decision Making  Medically screening exam initiated at 3:31 PM.  Appropriate orders placed.  LOURA PITT was informed that the remainder of the evaluation will be completed by another provider, this initial triage assessment does not replace that evaluation, and the importance of remaining in the ED until their evaluation is complete.  Does not meet code stroke criteria, no LVO criteria       Geneive Sandstrom A, PA-C 05/28/22 1546

## 2022-05-28 NOTE — ED Triage Notes (Signed)
Pt reports she came here today because she had a brief episode where she "went into [place]." She reports she felt confused, didn't realize what food was in her plate, couldn't think of what a twist tie was when her husband asked her to bring him one. The confused episode lasted about 30 mins associated with intermittent "distorted" vision changes x 1 month. She reports she feels normal now. A&Ox4.

## 2022-05-28 NOTE — ED Notes (Signed)
Pt states that their results have come up on their mychart. Informed the pt that I am not capable of interpreting their results and they would have to wait for a room and a provider to interpret those results for them. Pt states that they feel much better and that they are leaving and will follow up with their PCP.

## 2022-05-31 ENCOUNTER — Other Ambulatory Visit (INDEPENDENT_AMBULATORY_CARE_PROVIDER_SITE_OTHER): Payer: PPO

## 2022-05-31 DIAGNOSIS — E039 Hypothyroidism, unspecified: Secondary | ICD-10-CM | POA: Diagnosis not present

## 2022-05-31 DIAGNOSIS — Z0000 Encounter for general adult medical examination without abnormal findings: Secondary | ICD-10-CM | POA: Diagnosis not present

## 2022-05-31 DIAGNOSIS — E782 Mixed hyperlipidemia: Secondary | ICD-10-CM

## 2022-05-31 DIAGNOSIS — I10 Essential (primary) hypertension: Secondary | ICD-10-CM | POA: Diagnosis not present

## 2022-05-31 LAB — LIPID PANEL
Cholesterol: 152 mg/dL (ref 0–200)
HDL: 65.7 mg/dL (ref >39.00)
LDL Cholesterol: 64 mg/dL (ref 0–99)
NonHDL: 86.62
Total CHOL/HDL Ratio: 2
Triglycerides: 113 mg/dL (ref 0.0–149.0)
VLDL: 22.6 mg/dL (ref 0.0–40.0)

## 2022-05-31 LAB — CBC WITH DIFFERENTIAL/PLATELET
Basophils Absolute: 0 10*3/uL (ref 0.0–0.1)
Basophils Relative: 0.6 % (ref 0.0–3.0)
Eosinophils Absolute: 0.1 10*3/uL (ref 0.0–0.7)
Eosinophils Relative: 1 % (ref 0.0–5.0)
HCT: 40.7 % (ref 36.0–46.0)
Hemoglobin: 13.8 g/dL (ref 12.0–15.0)
Lymphocytes Relative: 26.1 % (ref 12.0–46.0)
Lymphs Abs: 1.8 10*3/uL (ref 0.7–4.0)
MCHC: 33.8 g/dL (ref 30.0–36.0)
MCV: 86.9 fl (ref 78.0–100.0)
Monocytes Absolute: 0.4 10*3/uL (ref 0.1–1.0)
Monocytes Relative: 6.1 % (ref 3.0–12.0)
Neutro Abs: 4.6 10*3/uL (ref 1.4–7.7)
Neutrophils Relative %: 66.2 % (ref 43.0–77.0)
Platelets: 221 10*3/uL (ref 150.0–400.0)
RBC: 4.69 Mil/uL (ref 3.87–5.11)
RDW: 13.4 % (ref 11.5–15.5)
WBC: 7 10*3/uL (ref 4.0–10.5)

## 2022-05-31 LAB — COMPREHENSIVE METABOLIC PANEL
ALT: 19 U/L (ref 0–35)
AST: 18 U/L (ref 0–37)
Albumin: 4.4 g/dL (ref 3.5–5.2)
Alkaline Phosphatase: 91 U/L (ref 39–117)
BUN: 19 mg/dL (ref 6–23)
CO2: 32 mEq/L (ref 19–32)
Calcium: 9.6 mg/dL (ref 8.4–10.5)
Chloride: 100 mEq/L (ref 96–112)
Creatinine, Ser: 0.63 mg/dL (ref 0.40–1.20)
GFR: 87.86 mL/min (ref >60.00)
Glucose, Bld: 106 mg/dL — ABNORMAL HIGH (ref 70–99)
Potassium: 3.9 mEq/L (ref 3.5–5.1)
Sodium: 137 mEq/L (ref 135–145)
Total Bilirubin: 0.5 mg/dL (ref 0.2–1.2)
Total Protein: 6.9 g/dL (ref 6.0–8.3)

## 2022-05-31 LAB — HEMOGLOBIN A1C: Hgb A1c MFr Bld: 6.4 % (ref 4.6–6.5)

## 2022-05-31 LAB — TSH: TSH: 1.16 u[IU]/mL (ref 0.35–5.50)

## 2022-06-01 ENCOUNTER — Other Ambulatory Visit: Payer: Self-pay

## 2022-06-01 ENCOUNTER — Other Ambulatory Visit: Payer: Self-pay | Admitting: Adult Health

## 2022-06-01 MED ORDER — METFORMIN HCL 500 MG PO TABS: 1000.0000 mg | ORAL_TABLET | Freq: Two times a day (BID) | ORAL | 0 refills | Status: DC

## 2022-06-01 NOTE — Progress Notes (Signed)
Rx called into the pharmacy. 

## 2022-06-02 ENCOUNTER — Encounter: Payer: Self-pay | Admitting: Adult Health

## 2022-06-02 ENCOUNTER — Ambulatory Visit (INDEPENDENT_AMBULATORY_CARE_PROVIDER_SITE_OTHER): Payer: PPO | Admitting: Adult Health

## 2022-06-02 VITALS — BP 120/80 | HR 70 | Temp 97.5°F | Ht 59.5 in | Wt 110.0 lb

## 2022-06-02 DIAGNOSIS — R410 Disorientation, unspecified: Secondary | ICD-10-CM

## 2022-06-02 LAB — URINALYSIS
Bilirubin Urine: NEGATIVE
Hgb urine dipstick: NEGATIVE
Ketones, ur: NEGATIVE
Leukocytes,Ua: NEGATIVE
Nitrite: NEGATIVE
Specific Gravity, Urine: <=1.005 — AB (ref 1.000–1.030)
Total Protein, Urine: NEGATIVE
Urine Glucose: NEGATIVE
Urobilinogen, UA: 0.2 (ref 0.0–1.0)
pH: 6 (ref 5.0–8.0)

## 2022-06-02 LAB — VITAMIN D 25 HYDROXY (VIT D DEFICIENCY, FRACTURES): VITD: 34.12 ng/mL (ref 30.00–100.00)

## 2022-06-02 LAB — VITAMIN B12: Vitamin B-12: 455 pg/mL (ref 211–911)

## 2022-06-02 NOTE — Progress Notes (Signed)
Subjective:    Patient ID: TYONA NILSEN, female    DOB: January 06, 1951, 71 y.o.   MRN: [id number]  HPI  71 year old female who  has a past medical history of Arthritis, Asthma, Diverticula, colon, Hyperlipidemia, Hypertension, and Hypothyroidism.  She presents with her husband today who helps provide history.   Supposedly over the last few months she has been having intermittent episodes of confusion/disorientation.  Her symptoms lasted for 30 minutes and then resolved and she is back at baseline.  The examples that her husband can give  Not a month ago she and her husband were at the grocery store.  She went to grab a sandwich from the food counter and her husband went to grab some of the groceries.  After the husband was done grocery shopping and the patient was still standing at the sandwich counter try and decide on what she should order.  She reports that she had some blurred vision and could not completely read this time.  Once her husband arrived it took her another 15 to 20 minutes to decide on what she was going to order.  After they ordered, her husband sent her to pick up cereal.  Was gone for some time and when he went looking for her she was in the condoment aisle that does not have cereal in it. She could not find the cereal aisle.   Most recently, about 5 days ago they were having a yard sale outside of their home.  A neighbor went to city [employer] to pick up barbecue for everybody and her daughter made her a plate.  When she received the plate she just kind of looked at it and stared at it and asked her daughter "what should I do with this".  Later that same day she had trouble remembering where they kept items in the kitchen.  Her husband has not noticed any slurred speech, facial droop, loss of strength during these episodes but he has noticed some word finding difficulty.  The patient reports that she seems disoriented, dizzy, have a mild headache that is described as a  "twinge" on the left side of her head, and has blurred vision during the episodes.  She does plan to follow-up with an eye doctor   She did go to the emergency room 5 days ago and had blood work and a CT scan done which showed no acute abnormality.  Due to wait time she left before she could be seen.   Review of Systems See HPI   Past Medical History:  Diagnosis Date   Arthritis    Osteo   Asthma    Seasonal   Diverticula, colon    Hyperlipidemia    Hypertension    Hypothyroidism     Social History   Socioeconomic History   Marital status: Married    Spouse name: Not on file   Number of children: 2   Years of education: Not on file   Highest education level: Not on file  Occupational History   Occupation: Retired  Tobacco Use   Smoking status: Never   Smokeless tobacco: Never  Vaping Use   Vaping Use: Never used  Substance and Sexual Activity   Alcohol use: No   Drug use: No   Sexual activity: Not on file  Other Topics Concern   Not on file  Social History Narrative   Retired   Married      Social Determinants of Engineer, drilling  Resource Strain: Low Risk  (12/14/2021)   Overall Financial Resource Strain (CARDIA)    Difficulty of Paying Living Expenses: Not hard at all  Food Insecurity: No Food Insecurity (12/14/2021)   Hunger Vital Sign    Worried About Running Out of Food in the Last Year: Never true    Ran Out of Food in the Last Year: Never true  Transportation Needs: No Transportation Needs (12/14/2021)   PRAPARE - Hydrologist (Medical): No    Lack of Transportation (Non-Medical): No  Physical Activity: Sufficiently Active (12/14/2021)   Exercise Vital Sign    Days of Exercise per Week: 6 days    Minutes of Exercise per Session: 90 min  Stress: No Stress Concern Present (12/14/2021)   [place]    Feeling of Stress : Not at all  Social Connections:  Moderately Integrated (12/03/2020)   Social Connection and Isolation Panel [NHANES]    Frequency of Communication with Friends and Family: Three times a week    Frequency of Social Gatherings with Friends and Family: Three times a week    Attends Religious Services: More than 4 times per year    Active Member of Clubs or Organizations: No    Attends Archivist Meetings: Never    Marital Status: Married  Human resources officer Violence: Not At Risk (12/03/2020)   Humiliation, Afraid, Rape, and Kick questionnaire    Fear of Current or Ex-Partner: No    Emotionally Abused: No    Physically Abused: No    Sexually Abused: No    Past Surgical History:  Procedure Laterality Date   ABDOMINAL HYSTERECTOMY     BREAST SURGERY     CARPAL TUNNEL RELEASE Bilateral    CERVICAL Bad Axe     x2   left shoulder surgery     MENISCUS REPAIR Right 2017   REDUCTION MAMMAPLASTY Bilateral    right shoulder     TONSILLECTOMY      Family History  Problem Relation Age of Onset   Melanoma Mother    Kidney disease Father    Diabetes Father    Heart disease Father    Breast cancer Paternal Grandmother    Colon cancer Neg Hx    Esophageal cancer Neg Hx    Rectal cancer Neg Hx    Stomach cancer Neg Hx     Allergies  Allergen Reactions   Latex     REACTION: rash/hives   Oxycodone-Acetaminophen     REACTION: nausea    Current Outpatient Medications on File Prior to Visit  Medication Sig Dispense Refill   Acetylcysteine (NAC) 500 MG CAPS Take by mouth.     amphetamine-dextroamphetamine (ADDERALL) 20 MG tablet Take 1 tablet (20 mg total) by mouth 2 (two) times daily. 60 tablet 0   amphetamine-dextroamphetamine (ADDERALL) 20 MG tablet Take 1 tablet (20 mg total) by mouth 2 (two) times daily. 60 tablet 0   amphetamine-dextroamphetamine (ADDERALL) 20 MG tablet Take 1 tablet (20 mg total) by mouth 2 (two) times daily. 60 tablet 0   Ascorbic Acid (VITAMIN C) 1000 MG  tablet Take 1,000 mg by mouth daily.     Cholecalciferol (D3-50 PO) Take by mouth.     Evening Primrose Oil 1000 MG CAPS Take 1,300 mg by mouth in the morning and at bedtime.     ezetimibe (ZETIA) 10 MG tablet TAKE 1 TABLET(10 MG) BY MOUTH  DAILY 90 tablet 3   hydrochlorothiazide (HYDRODIURIL) 25 MG tablet TAKE 1 TABLET(25 MG) BY MOUTH DAILY 90 tablet 3   ibuprofen (ADVIL,MOTRIN) 200 MG tablet Take 200 mg by mouth every 6 (six) hours as needed.     metFORMIN (GLUCOPHAGE) 500 MG tablet Take 2 tablets (1,000 mg total) by mouth 2 (two) times daily with a meal. 120 tablet 0   simvastatin (ZOCOR) 20 MG tablet TAKE 1 TABLET(20 MG) BY MOUTH EVERY OTHER DAY 45 tablet 3   SYNTHROID 75 MCG tablet TAKE 1 TABLET(75 MCG) BY MOUTH DAILY 90 tablet 3   vitamin B-12 (CYANOCOBALAMIN) 1000 MCG tablet Take 1,000 mcg by mouth 2 (two) times a week.     Zinc 20 MG CAPS Take by mouth.     No current facility-administered medications on file prior to visit.    BP 120/80   Pulse 70   Temp (!) 97.5 F (36.4 C) (Oral)   Ht 4' 11.5" (1.511 m)   Wt 110 lb (49.9 kg)   SpO2 98%   BMI 21.85 kg/m       Objective:   Physical Exam Vitals and nursing note reviewed.  Constitutional:      Appearance: Normal appearance.  Eyes:     Extraocular Movements: Extraocular movements intact.     Conjunctiva/sclera: Conjunctivae normal.     Pupils: Pupils are equal, round, and reactive to light.  Neck:     Vascular: Normal carotid pulses. No carotid bruit or JVD.  Cardiovascular:     Rate and Rhythm: Normal rate and regular rhythm.     Pulses: Normal pulses.     Heart sounds: Normal heart sounds.  Pulmonary:     Effort: Pulmonary effort is normal.     Breath sounds: Normal breath sounds.  Abdominal:     General: Abdomen is flat. Bowel sounds are normal.     Palpations: Abdomen is soft.  Musculoskeletal:     Right lower leg: No edema.     Left lower leg: No edema.  Skin:    General: Skin is warm and dry.   Neurological:     General: No focal deficit present.     Mental Status: She is alert and oriented to person, place, and time.     Cranial Nerves: No cranial nerve deficit.     Sensory: No sensory deficit.     Motor: No weakness.     Coordination: Coordination normal.     Gait: Gait normal.     Deep Tendon Reflexes: Reflexes normal.  Psychiatric:        Mood and Affect: Mood normal.        Behavior: Behavior normal.        Thought Content: Thought content normal.        Judgment: Judgment normal.       Assessment & Plan:   1. Confusion and disorientation -Check B12 and vitamin D as well as urinalysis today.  Also order MRI brain and MRA of her head.  Consider referral to neurology once these is her back.  Was advised to keep a diary of symptoms present again MMSE 30/30  - Vitamin B12; Future - VITAMIN D 25 Hydroxy (Vit-D Deficiency, Fractures); Future - MR Brain Wo Contrast; Future - MR Angiogram Head Wo Contrast; Future - Urinalysis; Future - Urinalysis - VITAMIN D 25 Hydroxy (Vit-D Deficiency, Fractures) - Vitamin B12  [name], NP

## 2022-06-03 ENCOUNTER — Telehealth: Payer: Self-pay

## 2022-06-03 NOTE — Telephone Encounter (Signed)
     Patient  visit on 05/28/2022  at [facility] was for Altered Mental Status.  Have you been able to follow up with your primary care physician? Yes saw PCP 06/02/22.  The patient was or was not able to obtain any needed medicine or equipment. No medications prescribed.  Are there diet recommendations that you are having difficulty following? No  Patient expresses understanding of discharge instructions and education provided has no other needs at this time.    [place] Resource Care Guide   [email or web address]  ?? [phone]   Website: [email or web address]  [email or web address]

## 2022-06-08 ENCOUNTER — Telehealth: Payer: Self-pay

## 2022-06-08 DIAGNOSIS — R410 Disorientation, unspecified: Secondary | ICD-10-CM

## 2022-06-08 NOTE — Telephone Encounter (Signed)
Pt husband called and stated that pt is still having the same sx from 05/28/2022. Pt husband stated she was having the same sx and it lasted about an HR. Pt husband stated that they have not heard anything regarding an appt. Information given for [facility] to schedule appt. Pt will call to get pt set up.

## 2022-06-12 ENCOUNTER — Ambulatory Visit
Admission: RE | Admit: 2022-06-12 | Discharge: 2022-06-12 | Disposition: A | Discharge status: Home / Self Care | Payer: PPO | Source: Ambulatory Visit | Attending: Adult Health | Admitting: Adult Health

## 2022-06-12 DIAGNOSIS — R410 Disorientation, unspecified: Secondary | ICD-10-CM | POA: Diagnosis not present

## 2022-06-12 DIAGNOSIS — I6782 Cerebral ischemia: Secondary | ICD-10-CM | POA: Diagnosis not present

## 2022-06-14 ENCOUNTER — Encounter: Payer: Self-pay | Admitting: Physician Assistant

## 2022-06-14 ENCOUNTER — Other Ambulatory Visit: Payer: Self-pay | Admitting: Adult Health

## 2022-06-14 DIAGNOSIS — R410 Disorientation, unspecified: Secondary | ICD-10-CM

## 2022-06-14 NOTE — Telephone Encounter (Signed)
Updated patient and husband on her MRI/MRA.   She would like to be referred to Neurology

## 2022-06-16 DIAGNOSIS — Z6821 Body mass index (BMI) 21.0-21.9, adult: Secondary | ICD-10-CM | POA: Diagnosis not present

## 2022-06-16 DIAGNOSIS — I10 Essential (primary) hypertension: Secondary | ICD-10-CM | POA: Diagnosis not present

## 2022-06-16 DIAGNOSIS — J029 Acute pharyngitis, unspecified: Secondary | ICD-10-CM | POA: Diagnosis not present

## 2022-06-18 ENCOUNTER — Other Ambulatory Visit: Payer: Self-pay | Admitting: Adult Health

## 2022-06-20 ENCOUNTER — Encounter: Payer: Self-pay | Admitting: Physician Assistant

## 2022-06-20 ENCOUNTER — Ambulatory Visit: Payer: PPO | Admitting: Physician Assistant

## 2022-06-20 VITALS — BP 157/90 | HR 88 | Resp 18 | Ht 59.5 in | Wt 110.0 lb

## 2022-06-20 DIAGNOSIS — R4182 Altered mental status, unspecified: Secondary | ICD-10-CM | POA: Diagnosis not present

## 2022-06-20 DIAGNOSIS — R413 Other amnesia: Secondary | ICD-10-CM | POA: Diagnosis not present

## 2022-06-20 NOTE — Progress Notes (Signed)
Assessment/Plan:    The patient is seen in neurologic consultation at the request of [name], NP for the evaluation of memory.  Gabrielle Vargas is a very pleasant 71 y.o. year old RH female with  a history of ADHD, arthritis,  seasonal asthma, hypertension, hyperlipidemia, hypothyroidism, B12 deficiency, Prediabetes, seen today for evaluation of memory loss.  She had a recent presentation to the ED with confusion and disorientation, negative for stroke or UTI.  Personally reviewed MRI of the brain was negative for acute findings, no artery occlusion, "no emergent finding or for explanation of symptoms ".  MRA of the head was negative.  However, the symptoms have been on and off present for the last 2 years, and she is concerned that is being exacerbated by recent COVID.  MoCA today is .23/30  Workup in progress   Memory Impairment  Neurocognitive testing to further evaluate cognitive concerns and determine other underlying cause of memory changes, including potential contribution from sleep, anxiety, or depression  Recommend good control of cardiovascular risk factors.   Continue to control mood as per PCP Check EEG to evaluate for seizures Agree with holding the driving  Folllow up 2-3 months   Subjective:    The patient is accompanied by her husband  who supplements the history.    How long did patient have memory difficulties?  For the last 2 years, the patient experienced some episodes of disorientation, initially attributed to COVID, as she had it 3 times, the symptoms appear to have been worse during those times.  On 05/19/2022, 2 weeks after COVID, her husband took her to the grocery store, and she stood at the sandwich counter trying to decide what she should order, despite 15 or 20 minutes have been passed by.  She also reported that she could not completely breathe, since she had any blurred vision.  Her husband then sent her to another area of the store to shop for  serial, and again, she could not make a decision, she cannot find what she came to look for.  In another event, at home, she was unable to remember where she kept items in the kitchen.  On Nov 11,  she was having a yard sale at her home, holding a plate and asked her daughter "what should I do with this " There were no episodes of slurred speech or facial droop or unilateral weakness with these episodes, she was just having word finding difficulty.  The patient reported at the time being disoriented, having a mild headache, and dizzy with blurry vision, for which she was sent to the ED.  CT scan of the head then was negative for acute abnormalities.  UA was negative.  On November 21, she was texting with her sister, but eventually she decided to talk with her on the phone, and felt that it was not making any sense.  The patient was aware of what was happening.  That last.  Lasted for about 1 hour.  In the interim, she has difficulty thinking of the word, or naming a kitchen tool, names of people and to a certain extent conversations that took place 2 to 3 days prior.   repeats oneself?  Endorsed her husband states that when he turned day prior load with, she asked "what is that smell ", 3 times (that was in November 21)  Ambulates  with difficulty?  Until recently, she and her husband walk 4-6 miles till this happened, he also enjoyed  playing golf and fishing  Recent falls?  Patient denies   Any head injuries?  Patient denies   History of seizures?   Patient denies   Wandering behavior?  Patient denies   Patient drives?   Patient no longer drives since the 33HL  Any personality changes?  Patient recognizes that for the last 2 months, she has made "little comments "to her husband, which are not unusual for her  Any history of depression?:  Patient denies   Hallucinations or paranoia?  Denies hallucinations, but lately she has been felt "on the age, as if something is going to happen" Patient reports that  sleeps well with occasional vivid dreams, but without REM behavior or sleepwalking    History of sleep apnea?  Patient denies   Any hygiene concerns?  Patient denies   Independent of bathing and dressing?  Endorsed  Does the patient needs help with medications? Patient is in charge  Who is in charge of the finances?  Husband is in charge  (always)  Any changes in appetite?  s "A little less, I don't eat as much as I used to "   Patient have trouble swallowing? Patient denies   Does the patient cook? Any kitchen accidents such as leaving the stove on? We love to cook at home  Patient denies nay kitchen accidents.  Any headaches?  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  Her husband reports that she has just looked at her symptoms on the Internet and self diagnosed Double vision? Patient denies  I have been sent to ophthalmologist, but records are not available for review  any focal numbness or tingling?  Patient denies   Chronic back pain Patient denies   Unilateral weakness?  Patient denies   Any tremors?  Patient denies   Any history of anosmia?  Patient denies   Any incontinence of urine?  Urge incontinence  Any bowel dysfunction?   Patient denies    History of heavy alcohol intake?  Patient denies   History of heavy tobacco use?  Patient denies   Family history of dementia?    [place] Alzheimer's disease   Patient lives:  Husband and daughter  Pertinent Labs B12 455, D34.12, CBC normal, A1c 6.4, total cholesterol 201, triglycerides 164, HDL normal LDL 107.  Allergies  Allergen Reactions   Latex     REACTION: rash/hives   Oxycodone-Acetaminophen     REACTION: nausea    Current Outpatient Medications  Medication Instructions   Acetylcysteine (NAC) 500 MG CAPS Oral   amphetamine-dextroamphetamine (ADDERALL) 20 MG tablet 20 mg, Oral, 2 times daily   amphetamine-dextroamphetamine (ADDERALL) 20 MG tablet 20 mg,  Oral, 2 times daily   amphetamine-dextroamphetamine (ADDERALL) 20 MG tablet 20 mg, Oral, 2 times daily   Cholecalciferol (D3-50 PO) Oral   cyanocobalamin (VITAMIN B12) 1,000 mcg, Oral, 2 times weekly   Evening Primrose Oil 1,300 mg, Oral, 2 times daily   ezetimibe (ZETIA) 10 MG tablet TAKE 1 TABLET(10 MG) BY MOUTH DAILY   hydrochlorothiazide (HYDRODIURIL) 25 MG tablet TAKE 1 TABLET(25 MG) BY MOUTH DAILY   ibuprofen (ADVIL) 200 mg, Oral, Every 6 hours PRN   metFORMIN (GLUCOPHAGE) 1,000 mg, Oral, 2 times daily with meals   simvastatin (ZOCOR) 20 MG tablet TAKE 1 TABLET(20 MG) BY MOUTH EVERY OTHER DAY   SYNTHROID 75 MCG tablet TAKE 1 TABLET(75 MCG) BY MOUTH DAILY   vitamin C  1,000 mg, Oral, Daily   Zinc 20 MG CAPS Oral     VITALS:   Vitals:   06/20/22 0951  BP: (!) 157/90  Pulse: 88  Resp: 18  SpO2: 97%  Weight: 110 lb (49.9 kg)  Height: 4' 11.5" (1.511 m)      12/14/2021    9:22 AM 08/17/2021    8:55 AM 12/03/2020    9:53 AM 02/05/2020    8:30 AM 10/09/2017    9:04 AM  Depression screen PHQ 2/9  Decreased Interest 0 0 0 0 0  Down, Depressed, Hopeless 0 0 0 0 0  PHQ - 2 Score 0 0 0 0 0  Altered sleeping  0     Tired, decreased energy  0     Change in appetite  0     Feeling bad or failure about yourself   0     Trouble concentrating  3     Moving slowly or fidgety/restless  0     PHQ-9 Score  3     Difficult doing work/chores  Not difficult at all       PHYSICAL EXAM   HEENT:  Normocephalic, atraumatic. The mucous membranes are moist. The superficial temporal arteries are without ropiness or tenderness. Cardiovascular: Regular rate and rhythm. Lungs: Clear to auscultation bilaterally. Neck: There are no carotid bruits noted bilaterally.  NEUROLOGICAL:    06/20/2022   12:00 PM  Montreal Cognitive Assessment   Visuospatial/ Executive (0/5) 5  Naming (0/3) 3  Attention: Read list of digits (0/2) 2  Attention: Read list of letters (0/1) 1  Attention: Serial 7  subtraction starting at 100 (0/3) 1  Language: Repeat phrase (0/2) 2  Language : Fluency (0/1) 0  Abstraction (0/2) 0  Delayed Recall (0/5) 3  Orientation (0/6) 6  Total 23  Adjusted Score (based on education) 23       06/02/2022   11:04 AM  MMSE - Mini Mental State Exam  Orientation to time 5  Orientation to Place 5  Registration 3  Attention/ Calculation 5  Recall 3  Language- name 2 objects 2  Language- repeat 1  Language- follow 3 step command 3  Language- read & follow direction 1  Write a sentence 1  Copy design 1  Total score 30     Orientation:  Alert and oriented to person, place and time. No aphasia or dysarthria. Fund of knowledge is appropriate. Recent memory impaired and remote memory intact.  Attention and concentration are normal.  Able to name objects and repeat phrases. Delayed recall  0/3 Cranial nerves: There is good facial symmetry. Extraocular muscles are intact and visual fields are full to confrontational testing. Speech is fluent, although occasionally hesitant, and clear. Marland Kitchen Hearing is intact to conversational tone. Tone: Tone is good throughout. Sensation: Sensation is intact to light touch  There is no sensory dermatomal level identified. Coordination: The patient has no difficulty with RAM's or FNF bilaterally. Normal finger to nose  Motor: Strength is 5/5 in the bilateral upper and lower extremities. There is no pronator drift. There are no fasciculations noted. DTR's: Deep tendon reflexes are 2/4 at the bilateral biceps, triceps, brachioradialis, patella and achilles.  Plantar responses are downgoing bilaterally. Gait and Station: The patient is able to ambulate without difficulty.The patient is able to ambulate in a tandem fashion, able to stand in the Romberg position.     Thank you for allowing [place] the opportunity to participate in the care of  this nice patient. Please do not hesitate to contact us for any questions or concerns.   Total time  spent on today's visit was 61 minutes dedicated to this patient today, preparing to see patient, examining the patient, ordering tests and/or medications and counseling the patient, documenting clinical information in the EHR or other health record, independently interpreting results and communicating results to the patient/family, discussing treatment and goals, answering patient's questions and coordinating care.  Cc:  [name], NP  Sharene Butters 06/20/2022 12:21 PM

## 2022-06-20 NOTE — Patient Instructions (Addendum)
It was a pleasure to see you today at our office.   Recommendations:  Follow up in 2-3   months Neurocognitive testing to further evaluate cognitive concerns and determine other underlying cause of memory changes, including potential contribution from ADHD and for diagnostic clarity Recommend good control of cardiovascular risk factors.   Continue to control mood as per PCP Check EEG to evaluate for seizures Agree with holding the driving  To help prevent Alzheimer's dementia: 1.  Proper sleep (8 hours a night) 2.  Routine Exercise (at least 10,000 steps per day) 3.  Socialize.  Interact with others.  Meet friends for lunch.  Visit family. 4.  Learn new things:  Read a book about a topic that interests you, watch a documentary, read the newspaper, talk to friends about different topics. 5.  Mediterranean diet  Mediterranean Diet A Mediterranean diet refers to food and lifestyle choices that are based on the traditions of countries located on the [employer]. This way of eating has been shown to help prevent certain conditions and improve outcomes for people who have chronic diseases, like kidney disease and heart disease. What are tips for following this plan? Lifestyle  Cook and eat meals together with your family, when possible. Drink enough fluid to keep your urine clear or pale yellow. Be physically active every day. This includes: Aerobic exercise like running or swimming. Leisure activities like gardening, walking, or housework. Get 7-8 hours of sleep each night. If recommended by your health care provider, drink red wine in moderation. This means 1 glass a day for nonpregnant women and 2 glasses a day for men. A glass of wine equals 5 oz (150 mL). Reading food labels  Check the serving size of packaged foods. For foods such as rice and pasta, the serving size refers to the amount of cooked product, not dry. Check the total fat in packaged foods. Avoid foods that have  saturated fat or trans fats. Check the ingredients list for added sugars, such as corn syrup. Shopping  At the grocery store, buy most of your food from the areas near the walls of the store. This includes: Fresh fruits and vegetables (produce). Grains, beans, nuts, and seeds. Some of these may be available in unpackaged forms or large amounts (in bulk). Fresh seafood. Poultry and eggs. Low-fat dairy products. Buy whole ingredients instead of prepackaged foods. Buy fresh fruits and vegetables in-season from local farmers markets. Buy frozen fruits and vegetables in resealable bags. If you do not have access to quality fresh seafood, buy precooked frozen shrimp or canned fish, such as tuna, salmon, or sardines. Buy small amounts of raw or cooked vegetables, salads, or olives from the deli or salad bar at your store. Stock your pantry so you always have certain foods on hand, such as olive oil, canned tuna, canned tomatoes, rice, pasta, and beans. Cooking  Cook foods with extra-virgin olive oil instead of using butter or other vegetable oils. Have meat as a side dish, and have vegetables or grains as your main dish. This means having meat in small portions or adding small amounts of meat to foods like pasta or stew. Use beans or vegetables instead of meat in common dishes like chili or lasagna. Experiment with different cooking methods. Try roasting or broiling vegetables instead of steaming or sauteing them. Add frozen vegetables to soups, stews, pasta, or rice. Add nuts or seeds for added healthy fat at each meal. You can add these to yogurt, salads, or  vegetable dishes. Marinate fish or vegetables using olive oil, lemon juice, garlic, and fresh herbs. Meal planning  Plan to eat 1 vegetarian meal one day each week. Try to work up to 2 vegetarian meals, if possible. Eat seafood 2 or more times a week. Have healthy snacks readily available, such as: Vegetable sticks with hummus. Greek  yogurt. Fruit and nut trail mix. Eat balanced meals throughout the week. This includes: Fruit: 2-3 servings a day Vegetables: 4-5 servings a day Low-fat dairy: 2 servings a day Fish, poultry, or lean meat: 1 serving a day Beans and legumes: 2 or more servings a week Nuts and seeds: 1-2 servings a day Whole grains: 6-8 servings a day Extra-virgin olive oil: 3-4 servings a day Limit red meat and sweets to only a few servings a month What are my food choices? Mediterranean diet Recommended Grains: Whole-grain pasta. Brown rice. Bulgar wheat. Polenta. Couscous. Whole-wheat bread. [place]. Vegetables: Artichokes. Beets. Broccoli. Cabbage. Carrots. Eggplant. Green beans. Chard. Kale. Spinach. Onions. Leeks. Peas. Squash. Tomatoes. Peppers. Radishes. Fruits: Apples. Apricots. Avocado. Berries. Bananas. Cherries. Dates. Figs. Grapes. Lemons. Melon. Oranges. Peaches. Plums. Pomegranate. Meats and other protein foods: Beans. Almonds. Sunflower seeds. Pine nuts. Peanuts. [place]. Salmon. Scallops. Shrimp. [place]. Tilapia. Clams. Oysters. Eggs. Dairy: Low-fat milk. Cheese. Greek yogurt. Beverages: Water. Red wine. Herbal tea. Fats and oils: Extra virgin olive oil. Avocado oil. Grape seed oil. Sweets and desserts: [place] yogurt with honey. Baked apples. Poached pears. Trail mix. Seasoning and other foods: Basil. Cilantro. Coriander. Cumin. Mint. Parsley. Sage. Rosemary. Tarragon. Garlic. Oregano. Thyme. Pepper. Balsalmic vinegar. Tahini. Hummus. Tomato sauce. Olives. Mushrooms. Limit these Grains: Prepackaged pasta or rice dishes. Prepackaged cereal with added sugar. Vegetables: Deep fried potatoes (french fries). Fruits: Fruit canned in syrup. Meats and other protein foods: Beef. Pork. Lamb. Poultry with skin. Hot dogs. Berniece Salines. Dairy: Ice cream. Sour cream. Whole milk. Beverages: Juice. Sugar-sweetened soft drinks. Beer. Liquor and spirits. Fats and oils: Butter. Canola oil. Vegetable oil. Beef  fat (tallow). Lard. Sweets and desserts: Cookies. Cakes. Pies. Candy. Seasoning and other foods: Mayonnaise. Premade sauces and marinades. The items listed may not be a complete list. Talk with your dietitian about what dietary choices are right for you. Summary The Mediterranean diet includes both food and lifestyle choices. Eat a variety of fresh fruits and vegetables, beans, nuts, seeds, and whole grains. Limit the amount of red meat and sweets that you eat. Talk with your health care provider about whether it is safe for you to drink red wine in moderation. This means 1 glass a day for nonpregnant women and 2 glasses a day for men. A glass of wine equals 5 oz (150 mL). This information is not intended to replace advice given to you by your health care provider. Make sure you discuss any questions you have with your health care provider. Document Released: 02/25/2016 Document Revised: 03/29/2016 Document Reviewed: 02/25/2016 Elsevier Interactive Patient Education  2017 [place] to call:  Memory  decline, memory medications: Call our office [phone]   For psychiatric meds, mood meds: Please have your primary care physician manage these medications.   Counseling regarding caregiver distress, including caregiver depression, anxiety and issues regarding community resources, adult day care programs, adult living facilities, or memory care questions:   Feel free to contact [employer], Social Worker at [phone]   For assessment of decision of mental capacity and competency:  Call Dr. Anthoney Harada, geriatric psychiatrist at [phone]  For  guidance in geriatric dementia issues please call [facility] [phone]  For guidance regarding [facility] and if placement were needed at the facility, contact Arnell Asal, Social Worker tel: [phone]  If you have any severe symptoms of a stroke, or other severe issues such as  confusion,severe chills or fever, etc call 911 or go to the ER as you may need to be evaluated further   Feel free to visit Facebook page " [email or web address]" for tips of how to care for people with memory problems.    Feel free to go to the following database for funded clinical studies conducted around the world: http://saunders.com/   https://www.triadclinicaltrials.com/     RECOMMENDATIONS FOR ALL PATIENTS WITH MEMORY PROBLEMS: 1. Continue to exercise (Recommend 30 minutes of walking everyday, or 3 hours every week) 2. Increase social interactions - continue going to [place] and enjoy social gatherings with friends and family 3. Eat healthy, avoid fried foods and eat more fruits and vegetables 4. Maintain adequate blood pressure, blood sugar, and blood cholesterol level. Reducing the risk of stroke and cardiovascular disease also helps promoting better memory. 5. Avoid stressful situations. Live a simple life and avoid aggravations. Organize your time and prepare for the next day in anticipation. 6. Sleep well, avoid any interruptions of sleep and avoid any distractions in the bedroom that may interfere with adequate sleep quality 7. Avoid sugar, avoid sweets as there is a strong link between excessive sugar intake, diabetes, and cognitive impairment We discussed the Mediterranean diet, which has been shown to help patients reduce the risk of progressive memory disorders and reduces cardiovascular risk. This includes eating fish, eat fruits and green leafy vegetables, nuts like almonds and hazelnuts, walnuts, and also use olive oil. Avoid fast foods and fried foods as much as possible. Avoid sweets and sugar as sugar use has been linked to worsening of memory function.  There is always a concern of gradual progression of memory problems. If this is the case, then we may need to adjust level of care according to patient needs. Support, both to the patient and caregiver, should then be put into  place.    FALL PRECAUTIONS: Be cautious when walking. Scan the area for obstacles that may increase the risk of trips and falls. When getting up in the mornings, sit up at the edge of the bed for a few minutes before getting out of bed. Consider elevating the bed at the head end to avoid drop of blood pressure when getting up. Walk always in a well-lit room (use night lights in the walls). Avoid area rugs or power cords from appliances in the middle of the walkways. Use a walker or a cane if necessary and consider physical therapy for balance exercise. Get your eyesight checked regularly.  FINANCIAL OVERSIGHT: Supervision, especially oversight when making financial decisions or transactions is also recommended.  HOME SAFETY: Consider the safety of the kitchen when operating appliances like stoves, microwave oven, and blender. Consider having supervision and share cooking responsibilities until no longer able to participate in those. Accidents with firearms and other hazards in the house should be identified and addressed as well.   ABILITY TO BE LEFT ALONE: If patient is unable to contact 911 operator, consider using LifeLine, or when the need is there, arrange for someone to stay with patients. Smoking is a fire hazard, consider supervision or cessation. Risk of wandering should be assessed by caregiver and if detected at any point, supervision and safe  proof recommendations should be instituted.  MEDICATION SUPERVISION: Inability to self-administer medication needs to be constantly addressed. Implement a mechanism to ensure safe administration of the medications.   DRIVING: Regarding driving, in patients with progressive memory problems, driving will be impaired. We advise to have someone else do the driving if trouble finding directions or if minor accidents are reported. Independent driving assessment is available to determine safety of driving.   If you are interested in the driving  assessment, you can contact the following:  [employer] in [place]  [place] [phone]  [place]  [facility] [phone] or [phone]

## 2022-06-22 ENCOUNTER — Ambulatory Visit (INDEPENDENT_AMBULATORY_CARE_PROVIDER_SITE_OTHER): Payer: PPO | Admitting: Neurology

## 2022-06-22 DIAGNOSIS — R4182 Altered mental status, unspecified: Secondary | ICD-10-CM | POA: Diagnosis not present

## 2022-06-22 NOTE — Procedures (Signed)
ELECTROENCEPHALOGRAM REPORT  Date of Study: 06/22/2022  Patient's Name: Gabrielle Vargas MRN: [id number] Date of Birth: 02-Nov-1950  Referring Provider: Dr. Ellouise Newer  Clinical History: This is a 71 year old woman with an episode of confusion. EEG for classification.  Medications: Adderall, Zetia, Metformin, Zocor  Technical Summary: A multichannel digital 1-hour EEG recording measured by the international 10-20 system with electrodes applied with paste and impedances below 5000 ohms performed in our laboratory with EKG monitoring in an awake and asleep patient.  Hyperventilation was not performed. Photic stimulation was performed.  The digital EEG was referentially recorded, reformatted, and digitally filtered in a variety of bipolar and referential montages for optimal display.    Description: The patient is awake and asleep during the recording.  During maximal wakefulness, there is a symmetric, medium voltage 10 Hz posterior dominant rhythm that attenuates with eye opening.  The record is symmetric.  During drowsiness and sleep, there is an increase in theta and delta slowing of the background.  Vertex waves and symmetric sleep spindles were seen. Photic stimulation did not elicit any abnormalities.  There were no epileptiform discharges or electrographic seizures seen.    EKG lead was unremarkable.  Impression: This 1-hour awake and asleep EEG is normal.    Clinical Correlation: A normal EEG does not exclude a clinical diagnosis of epilepsy.  If further clinical questions remain, prolonged EEG may be helpful.  Clinical correlation is advised.   Ellouise Newer, M.D.

## 2022-06-22 NOTE — Progress Notes (Signed)
EEG complete - results pending 

## 2022-06-22 NOTE — Progress Notes (Signed)
EEG is normal, no indication of seizures, thanks

## 2022-06-27 ENCOUNTER — Other Ambulatory Visit: Payer: Self-pay | Admitting: Adult Health

## 2022-06-28 ENCOUNTER — Other Ambulatory Visit: Payer: Self-pay | Admitting: Adult Health

## 2022-07-15 ENCOUNTER — Encounter: Payer: PPO | Admitting: Adult Health

## 2022-08-03 ENCOUNTER — Encounter: Payer: Self-pay | Admitting: Psychology

## 2022-08-03 DIAGNOSIS — M1990 Unspecified osteoarthritis, unspecified site: Secondary | ICD-10-CM | POA: Insufficient documentation

## 2022-08-03 DIAGNOSIS — E785 Hyperlipidemia, unspecified: Secondary | ICD-10-CM | POA: Insufficient documentation

## 2022-08-04 ENCOUNTER — Encounter: Payer: Self-pay | Admitting: Psychology

## 2022-08-04 ENCOUNTER — Ambulatory Visit: Payer: PPO | Admitting: Psychology

## 2022-08-04 DIAGNOSIS — Z8616 Personal history of COVID-19: Secondary | ICD-10-CM | POA: Diagnosis not present

## 2022-08-04 DIAGNOSIS — R41840 Attention and concentration deficit: Secondary | ICD-10-CM

## 2022-08-04 DIAGNOSIS — E538 Deficiency of other specified B group vitamins: Secondary | ICD-10-CM | POA: Insufficient documentation

## 2022-08-04 DIAGNOSIS — R4789 Other speech disturbances: Secondary | ICD-10-CM

## 2022-08-04 DIAGNOSIS — R4189 Other symptoms and signs involving cognitive functions and awareness: Secondary | ICD-10-CM

## 2022-08-04 DIAGNOSIS — G3184 Mild cognitive impairment, so stated: Secondary | ICD-10-CM

## 2022-08-04 HISTORY — DX: Mild cognitive impairment of uncertain or unknown etiology: G31.84

## 2022-08-04 NOTE — Progress Notes (Signed)
NEUROPSYCHOLOGICAL EVALUATION [place]. [facility] Department of Neurology  Date of Evaluation: August 04, 2022  Reason for Referral:   Gabrielle Vargas is a 72 y.o. right-handed Caucasian female referred by Sharene Butters, PA-C, to characterize her current cognitive functioning and assist with diagnostic clarity and treatment planning in the context of subjective cognitive decline.   Assessment and Plan:   Clinical Impression(s): Gabrielle Vargas's pattern of performance is suggestive of impairment across several aspects of expressive language, namely verbal fluency (semantic worse than phonemic) and confrontation naming. Performance variability was exhibited across executive functioning, while an additional weakness was noted across inattention and vigilance aspects of a continuous performance task. Performances were appropriate relative to age-matched peers across processing speed, basic attention, working memory, receptive language, sentence repetition, expressive writing, visuospatial abilities, and all aspects of learning and memory. Gabrielle Vargas denied difficulties completing instrumental activities of daily living (ADLs) independently. As such, given evidence for cognitive dysfunction described above, she meets criteria for a Mild Neurocognitive Disorder ("mild cognitive impairment") at the present time.  The etiology for ongoing dysfunction is unclear at the present time. Neurologically speaking, recent neuroimaging revealed minimal microvascular ischemic changes, not suggestive of any prior infarcts or reason to suspect an underlying vascular etiology. A recent 1-hour EEG did not reveal epileptiform discharges during active monitoring. Performances across memory testing were not suggestive of Alzheimer's disease. Behavioral characteristics were not suggestive of Lewy body disease, another more rare parkinsonian condition, or frontotemporal lobar degeneration.  Typical conversation was also not strongly suggestive of a non-fluent primary progressive aphasia or semantic dementia presentation. While expressive language dysfunction could raise concern for a logopenic aphasia presentation, overall cognitive patterns were not wholly consistent with this illness presently.   Gabrielle Vargas does have a longstanding history of distractibility and trouble with sustained focus. Performance across a continuous performance task did align with her previous diagnosis of ADHD. There remains the potential for attentional lapses to be a direct cause for word finding difficulties and instances where she may lose her train of thought or become disoriented in her day-to-day life. While a contribution from her history of several COVID-19 infections is plausible, there has yet to be a sufficient number of high quality longitudinal studies to truly understand the likelihood and extent of persisting cognitive dysfunction stemming from prior COVID-19 infection. Current studies have theorized many different potential contributing factors and it remains unclear if deficits are due to direct CNS involvement or more related to external factors (e.g., fatigue, sleep disruption, psychiatric distress, etc.). Stating a direct link between expressive language dysfunction and COVID-19 experience with any degree of certainly would appear premature at the present time. Continued medical monitoring will be important moving forward.   Recommendations: A repeat neuropsychological evaluation in 18-24 months (or sooner if functional decline is noted) is recommended to assess the trajectory of future cognitive decline should it occur. This will also aid in future efforts towards improved diagnostic clarity.  Should there be a progression of current deficits over time, she is unlikely to regain any independent living skills lost. Therefore, it is recommended that she remain as involved as possible in all  aspects of household chores, finances, and medication management, with supervision to ensure adequate performance. She will likely benefit from the establishment and maintenance of a routine in order to maximize her functional abilities over time.  Gabrielle Vargas is encouraged to attend to lifestyle factors for brain health (e.g., regular physical exercise, good nutrition habits, regular  participation in cognitively-stimulating activities, and general stress management techniques), which are likely to have benefits for both emotional adjustment and cognition. In fact, in addition to promoting good general health, regular exercise incorporating aerobic activities (e.g., brisk walking, jogging, cycling, etc.) has been demonstrated to be a very effective treatment for depression and stress, with similar efficacy rates to both antidepressant medication and psychotherapy. Optimal control of vascular risk factors (including safe cardiovascular exercise and adherence to dietary recommendations) is encouraged. Continued participation in activities which provide mental stimulation and social interaction is also recommended.   Memory can be improved using internal strategies such as rehearsal, repetition, chunking, mnemonics, association, and imagery. External strategies such as written notes in a consistently used memory journal, visual and nonverbal auditory cues such as a calendar on the refrigerator or appointments with alarm, such as on a cell phone, can also help maximize recall.    To address problems with fluctuating attention, she may wish to consider:   -Avoiding external distractions when needing to concentrate   -Limiting exposure to fast paced environments with multiple sensory demands   -Writing down complicated information and using checklists   -Attempting and completing one task at a time (i.e., no multi-tasking)   -Verbalizing aloud each step of a task to maintain focus   -Reducing the amount of  information considered at one time  Review of Records:   Gabrielle Vargas was seen by [facility]rology Sharene Butters, PA-C) on 06/20/2022 for an evaluation of memory loss. Briefly, following an initial contraction of COVID-19 in the spring of 2021, Gabrielle Vargas described sporadic episodes of disorientation. These episodes culminated with having have several experiences in close succession in November 2023. On 05/19/2022, two weeks after contracting COVID-19 for the third time, she was noted standing at the sandwich counter at a local grocery store for 15-20 minutes being unable to decide what to purchase. Her husband sent her to the cereal aisle and she again experienced notable indecision. In another event while at home, she described being unable to recall where common items were kept in her kitchen. On 05/28/2022 during a yard sale, she was found holding a plate, asking "what should I do with this?" and exhibited other generalized confusion. Finally, on 06/07/2022, she described confusion while attempting to text her sister, ultimately having to call her on the telephone in an attempt to better understand and communicate. No episodes of slurred speech or facial droop were reported. Her only cognitive complaint surrounded word finding difficulties. Generally speaking, ADLs were described as intact. Performance on a brief cognitive screening instrument (MOCA) was 23/30. Ultimately, Gabrielle Vargas was referred for a comprehensive neuropsychological evaluation to characterize her cognitive abilities and to assist with diagnostic clarity and treatment planning.   Brain MRI on 06/14/2022 revealed mild generalized volume loss and less than typical microvascular ischemic changes. A 1-hour EEG on 06/22/2022 was unremarkable.  Past Medical History:  Diagnosis Date   ADHD (attention deficit hyperactivity disorder) 05/09/2007   Diverticulitis of colon (without mention of hemorrhage) 09/17/2013   Essential  hypertension 10/09/2017   History of COVID-19 2021   Hyperlipidemia    Hypothyroidism    Low back pain radiating to right leg 10/09/2017   Mild intermittent asthma with acute exacerbation 05/30/2017   Osteoarthritis    Postmenopausal atrophic vaginitis, severe 08/18/2008   Right knee pain 03/13/2014   S/P arthroscopy of right knee 06/03/2014   Vitamin B12 deficiency     Past Surgical History:  Procedure Laterality Date  ABDOMINAL HYSTERECTOMY     BREAST SURGERY     CARPAL TUNNEL RELEASE Bilateral    CERVICAL DISC ARTHROPLASTY     CESAREAN SECTION     x2   left shoulder surgery     MENISCUS REPAIR Right 2017   REDUCTION MAMMAPLASTY Bilateral    right shoulder     TONSILLECTOMY      Current Outpatient Medications:    Acetylcysteine (NAC) 500 MG CAPS, Take by mouth., Disp: , Rfl:    amphetamine-dextroamphetamine (ADDERALL) 20 MG tablet, Take 1 tablet (20 mg total) by mouth 2 (two) times daily., Disp: 60 tablet, Rfl: 0   amphetamine-dextroamphetamine (ADDERALL) 20 MG tablet, Take 1 tablet (20 mg total) by mouth 2 (two) times daily., Disp: 60 tablet, Rfl: 0   amphetamine-dextroamphetamine (ADDERALL) 20 MG tablet, Take 1 tablet (20 mg total) by mouth 2 (two) times daily., Disp: 60 tablet, Rfl: 0   Ascorbic Acid (VITAMIN C) 1000 MG tablet, Take 1,000 mg by mouth daily., Disp: , Rfl:    Cholecalciferol (D3-50 PO), Take by mouth., Disp: , Rfl:    Evening Primrose Oil 1000 MG CAPS, Take 1,300 mg by mouth in the morning and at bedtime., Disp: , Rfl:    ezetimibe (ZETIA) 10 MG tablet, TAKE 1 TABLET(10 MG) BY MOUTH DAILY, Disp: 90 tablet, Rfl: 3   hydrochlorothiazide (HYDRODIURIL) 25 MG tablet, TAKE 1 TABLET(25 MG) BY MOUTH DAILY, Disp: 90 tablet, Rfl: 1   ibuprofen (ADVIL,MOTRIN) 200 MG tablet, Take 200 mg by mouth every 6 (six) hours as needed., Disp: , Rfl:    metFORMIN (GLUCOPHAGE) 500 MG tablet, TAKE 2 TABLETS(1000 MG) BY MOUTH TWICE DAILY WITH A MEAL, Disp: 360 tablet, Rfl: 0    simvastatin (ZOCOR) 20 MG tablet, TAKE 1 TABLET(20 MG) BY MOUTH EVERY OTHER DAY, Disp: 45 tablet, Rfl: 3   SYNTHROID 75 MCG tablet, TAKE 1 TABLET(75 MCG) BY MOUTH DAILY, Disp: 90 tablet, Rfl: 3   vitamin B-12 (CYANOCOBALAMIN) 1000 MCG tablet, Take 1,000 mcg by mouth 2 (two) times a week., Disp: , Rfl:    Zinc 20 MG CAPS, Take by mouth., Disp: , Rfl:   Clinical Interview:   The following information was obtained during a clinical interview with Gabrielle Vargas and her husband prior to cognitive testing.  Cognitive Symptoms: Decreased short-term memory: Endorsed. However, when asked, she primarily described word finding difficulties, stating that information will generally come with time. She did not describe any prominent more traditional memory concerns. Her husband did suggest that she has some trouble recalling details of recent conversations and is somewhat repetitive in conversation from time to time.  Decreased long-term memory: Denied. Decreased attention/concentration: Endorsed. She was diagnosed with ADHD as an adult but described prominent trouble with inattention, distractibility, disorganization, and multi-tasking throughout the majority of her life. Symptoms were said to have persisted over time.  Reduced processing speed: Denied. She described her mind as racing and overly active rather than seeming slowed or feeling foggy. Difficulties with executive functions: Endorsed. As stated above, she described a lifelong history of trouble with organization, multi-tasking, and indecision. She did not report prominent difficulties with impulsivity. Her husband stated that she has seemed more argumentative and perhaps more defensive surrounding cognitive lapses lately. However, he did not report severe personality changes surrounding disinhibition or behavioral dysregulation.  Difficulties with emotion regulation: Denied. Difficulties with receptive language: Denied. Difficulties with word  finding: Endorsed. Decreased visuoperceptual ability: Denied.  Trajectory of deficits: Per Gabrielle Vargas, trouble with word  finding first become noticeable after contracting COVID-19 for the first of three times in the spring of 2021. Since that time, difficulties have "come and go" rather than represent a consistent or progressively worsening symptom. Per her husband, memory concerns (e.e., repetitive conversations) may have been present prior to her illness in 2021. He did not report his perception of severe cognitive decline over time.   Difficulties completing ADLs: Denied.  Additional Medical History: History of traumatic brain injury/concussion: Denied. History of stroke: Denied. History of seizure activity: Denied. History of known exposure to toxins: Denied. Symptoms of chronic pain: Denied outside of generalized arthritic pain.  Experience of frequent headaches/migraines: Denied.  Sensory changes: She wears glasses with benefit. She also described experiences where her vision is blurred or she sees wavy lines in her visual field. The cause for this was unknown, although she and her husband theorized that she may be having ocular migraine experiences. She reported being scheduled to see an eye doctor in the near future. Other sensory changes/difficulties (e.g., hearing, taste, smell) were denied.  Balance/coordination difficulties: Denied. She also denied any recent falls.  Other motor difficulties: Denied.  Sleep History: Estimated hours obtained each night: 8 hours.  Difficulties falling asleep: Denied. Difficulties staying asleep: Denied. Feels rested and refreshed upon awakening: Denied. She reported generally waking feeling fatigue and her husband noted that she will nap during the day. He added that this represents a notable change and that prominent fatigue was generally not something Gabrielle Vargas dealt with.   History of snoring: Endorsed. However, her husband stated that  snoring was "not that much" overall. History of waking up gasping for air: Denied. Witnessed breath cessation while asleep: Denied.  History of vivid dreaming: Denied. Excessive movement while asleep: Denied. Instances of acting out her dreams: Denied.  Psychiatric/Behavioral Health History: Depression: She described her current mood as "pretty good" and denied to her knowledge any prior mental health concerns or diagnoses. Current or remote suicidal ideation, intent, or plan was denied.  Anxiety: Denied. Mania: Denied. Trauma History: Denied. Visual/auditory hallucinations: Denied. Delusional thoughts: Denied.  Tobacco: Denied. Alcohol: She denied current alcohol consumption as well as a history of problematic alcohol abuse or dependence.  Recreational drugs: Denied.  Family History: Problem Relation Age of Onset   Melanoma Mother    Kidney disease Father    Diabetes Father    Heart disease Father    Breast cancer Paternal Grandmother    ADD / ADHD Other        multiple family members   Colon cancer Neg Hx    Esophageal cancer Neg Hx    Rectal cancer Neg Hx    Stomach cancer Neg Hx    This information was confirmed by Gabrielle Vargas.  Academic/Vocational History: Highest level of educational attainment: 13 years. She graduated from high school and completed one additional year of college. 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 She described herself as an average (A/B/C) student in academic settings. She alluded to a weakness with reading, stating that she has always needed to re-read passages several times in order for information to sink in. This was attributed to her ADHD history.  History of developmental delay: Denied. History of grade repetition: Denied. Enrollment in special education courses: Denied. History of LD: Denied.  Employment: Retired. She previously worked as a Copywriter, advertising.  Evaluation Results:    Behavioral Observations: Gabrielle Vargas was accompanied by her husband, arrived to her appointment on  time, and was appropriately dressed and groomed. She appeared alert and oriented. Observed gait and station were within normal limits. Gross motor functioning appeared intact upon informal observation and no abnormal movements (e.g., tremors) were noted. Her affect was generally relaxed and positive. Spontaneous speech was fluent and word finding difficulties were not observed during the clinical interview. Thought processes were coherent, organized, and normal in content. Insight into her cognitive difficulties appeared adequate.   During testing, sustained attention was appropriate. Task engagement was adequate and she persisted when challenged. She did fatigue as the evaluation progressed. It was somewhat abbreviated in response. Overall, Gabrielle Vargas was cooperative with the clinical interview and subsequent testing procedures.   Adequacy of Effort: The validity of neuropsychological testing is limited by the extent to which the individual being tested may be assumed to have exerted adequate effort during testing. Gabrielle Vargas expressed her intention to perform to the best of her abilities and exhibited adequate task engagement and persistence. Scores across stand-alone and embedded performance validity measures were within expectation. As such, the results of the current evaluation are believed to be a valid representation of Gabrielle Vargas's current cognitive functioning.  Test Results: Gabrielle Vargas was fully oriented at the time of the current evaluation.  Intellectual abilities based upon educational and vocational attainment were estimated to be in the average range. Premorbid abilities were estimated to be within the above average range based upon a single-word reading test.   Processing speed was variable but overall adequate, ranging from the below average to above average  normative ranges. Basic attention was average to above average. More complex attention (e.g., working memory) was average. Across a continuous performance task (CPT), she exhibited abnormal performances across inattentiveness and vigilance, with performances suggesting a moderate likelihood of an underlying attentional disorder such as ADHD. Executive functioning was variable, ranging from the exceptionally low to above average normative ranges.  Assessed receptive language abilities were above average. Likewise, Gabrielle Vargas did not exhibit any difficulties comprehending task instructions and answered all questions asked of her appropriately. Assessed expressive language was variable and overall below expectation. Sentence repetition was average, phonemic fluency was below average, semantic fluency was exceptionally low to below average, confrontation naming was well below average, and writing samples were appropriate.      Assessed visuospatial/visuoconstructional abilities were average to well above average.    Learning (i.e., encoding) of novel verbal information was average. Spontaneous delayed recall (i.e., retrieval) of previously learned information was average to above average. Retention rates were 82% across a story learning task, 63% across a list learning task, and 75% across a figure drawing task. Performance across recognition tasks was appropriate, suggesting evidence for information consolidation.   Results of emotional screening instruments suggested that recent symptoms of generalized anxiety were in the minimal range, while symptoms of depression were within the mild range. A screening instrument assessing recent sleep quality suggested the presence of minimal sleep dysfunction.  Tables of Scores:   Note: This summary of test scores accompanies the interpretive report and should not be considered in isolation without reference to the appropriate sections in the text. Descriptors are  based on appropriate normative data and may be adjusted based on clinical judgment. Terms such as "Within Normal Limits" and "Outside Normal Limits" are used when a more specific description of the test score cannot be determined.       Percentile - Normative Descriptor > 98 - Exceptionally High 91-97 - Well Above Average 75-90 - Above  Average 25-74 - Average 9-24 - Below Average 2-8 - Well Below Average < 2 - Exceptionally Low       Orientation:      Raw Score Percentile   NAB Orientation, Form 1 29/29 --- ---       Cognitive Screening:      Raw Score Percentile   SLUMS: 24/30 --- ---       RBANS, Form A: Standard Score/ Scaled Score Percentile   Total Score 98 45 Average  Immediate Memory 97 42 Average    List Learning 10 50 Average    Story Memory 9 37 Average  Visuospatial/Constructional 121 92 Well Above Average    Figure Copy 14 91 Well Above Average    Line Orientation 18/20 51-75 Average  Language 71 3 Well Below Average    Picture Naming 8/10 3-9 Well Below Average    Semantic Fluency 5 5 Well Below Average  Attention 100 50 Average    Digit Span 12 75 Above Average    Coding 8 25 Average  Delayed Memory 107 68 Average    List Recall 5/10 51-75 Average    List Recognition 20/20 51-75 Average    Story Recall 10 50 Average    Story Recognition 12/12 69+ Average    Figure Recall 12 75 Above Average    Figure Recognition 6/8 30-52 Average        Intellectual Functioning:      Standard Score Percentile   Test of Premorbid Functioning: 117 87 Above Average       Attention/Executive Function:     Trail Making Test (TMT): Raw Score (T Score) Percentile     Part A 47 secs.,  0 errors (39) 14 Below Average    Part B 128 secs.,  0 errors (38) 12 Below Average         Scaled Score Percentile   WAIS-IV Digit Span: 9 37 Average    Forward 9 37 Average    Backward 9 37 Average    Sequencing 10 50 Average       Conners CPT 3: T Score Percentile     d' 36 8 Low     Omissions 45 31 Average    Commissions 37 9 Low    Perseverations 47 38 Average    HRT 80 >99 Atypically Slow    HRT SD 52 58 Average    Variability 45 31 Average       D-KEFS Color-Word Interference Test: Raw Score (Scaled Score) Percentile     Color Naming 32 secs. (11) 63 Average    Word Reading 21 secs. (12) 75 Above Average    Inhibition 56 secs. (13) 84 Above Average      Total Errors 0 errors (13) 84 Above Average    Inhibition/Switching 158 secs. (1) <1 Exceptionally Low      Total Errors 3 errors (10) 50 Average       D-KEFS Verbal Fluency Test: Raw Score (Scaled Score) Percentile     Letter Total Correct 26 (7) 16 Below Average    Category Total Correct 25 (7) 16 Below Average    Category Switching Total Correct 11 (9) 37 Average    Category Switching Accuracy 10 (9) 37 Average      Total Set Loss Errors 1 (11) 63 Average      Total Repetition Errors 1 (12) 75 Above Average       Language:      Raw Score Percentile  PPVT Screening Instrument: 16/16 --- Within Normal Limits  Sentence Repetition: 14/22 27 Average       Verbal Fluency Test: Raw Score (T Score) Percentile     Phonemic Fluency (FAS) 28 (38) 12 Below Average    Animal Fluency 8 (22) <1 Exceptionally Low        NAB Language Module, Form 1: T Score Percentile     Auditory Comprehension 58 79 Above Average    Naming 25/31 (30) 2 Well Below Average    Writing 54 66 Average       Visuospatial/Visuoconstruction:      Raw Score Percentile   Clock Drawing: 10/10 --- Within Normal Limits       Mood and Personality:      Raw Score Percentile   Geriatric Depression Scale: 10 --- Mild  Geriatric Anxiety Scale: 2 --- Minimal    Somatic 1 --- Minimal    Cognitive 0 --- Minimal    Affective 1 --- Minimal       Additional Questionnaires:      Raw Score Percentile   PROMIS Sleep Disturbance Questionnaire: 11 --- None to Slight   Informed Consent and Coding/Compliance:   The current evaluation represents  a clinical evaluation for the purposes previously outlined by the referral source and is in no way reflective of a forensic evaluation.   Gabrielle Vargas. Pietila was provided with a verbal description of the nature and purpose of the present neuropsychological evaluation. Also reviewed were the foreseeable risks and/or discomforts and benefits of the procedure, limits of confidentiality, and mandatory reporting requirements of this provider. The patient was given the opportunity to ask questions and receive answers about the evaluation. Oral consent to participate was provided by the patient.   This evaluation was conducted by Christia Reading, Ph.D., ABPP-CN, board certified clinical neuropsychologist. Gabrielle Vargas. Hiser completed a clinical interview with Dr. Melvyn Novas, billed as one unit [id number], and 165 minutes of cognitive testing and scoring, billed as one unit [phone] and five additional units [id number]. Psychometrist Milana Kidney, B.S., assisted Dr. Melvyn Novas with test administration and scoring procedures. As a separate and discrete service, Dr. Melvyn Novas spent a total of 160 minutes in interpretation and report writing billed as one unit [phone] and two units [id number].

## 2022-08-04 NOTE — Progress Notes (Signed)
   Psychometrician Note   Cognitive testing was administered to Gabrielle Vargas by Milana Kidney, B.S. (psychometrist) under the supervision of Dr. Christia Reading, Ph.D., licensed psychologist on 08/04/2022. Gabrielle Vargas did not appear overtly distressed by the testing session per behavioral observation or responses across self-report questionnaires. Rest breaks were offered.    The battery of tests administered was selected by Dr. Christia Reading, Ph.D. with consideration to Gabrielle Vargas's current level of functioning, the nature of her symptoms, emotional and behavioral responses during interview, level of literacy, observed level of motivation/effort, and the nature of the referral question. This battery was communicated to the psychometrist. Communication between Dr. Christia Reading, Ph.D. and the psychometrist was ongoing throughout the evaluation and Dr. Christia Reading, Ph.D. was immediately accessible at all times. Dr. Christia Reading, Ph.D. provided supervision to the psychometrist on the date of this service to the extent necessary to assure the quality of all services provided.    Gabrielle Vargas will return within approximately 1-2 weeks for an interactive feedback session with Gabrielle Vargas at which time her test performances, clinical impressions, and treatment recommendations will be reviewed in detail. Gabrielle Vargas understands she can contact our office should she require our assistance before this time.  A total of 165 minutes of billable time were spent face-to-face with Gabrielle Vargas by the psychometrist. This includes both test administration and scoring time. Billing for these services is reflected in the clinical report generated by Dr. Christia Reading, Ph.D.  This note reflects time spent with the psychometrician and does not include test scores or any clinical interpretations made by Gabrielle Vargas. The full report will follow in a separate note.

## 2022-08-05 ENCOUNTER — Encounter: Payer: Self-pay | Admitting: Psychology

## 2022-08-05 LAB — HM DIABETES EYE EXAM

## 2022-08-11 ENCOUNTER — Ambulatory Visit: Payer: PPO | Admitting: Psychology

## 2022-08-11 DIAGNOSIS — R41840 Attention and concentration deficit: Secondary | ICD-10-CM | POA: Diagnosis not present

## 2022-08-11 DIAGNOSIS — Z8616 Personal history of COVID-19: Secondary | ICD-10-CM

## 2022-08-11 NOTE — Progress Notes (Signed)
   Neuropsychology Feedback Session Gabrielle Vargas. [facility]  Reason for Referral:   Gabrielle Vargas is a 72 y.o. right-handed Caucasian female referred by Sharene Butters, PA-C, to characterize her current cognitive functioning and assist with diagnostic clarity and treatment planning in the context of subjective cognitive decline.   Feedback:   Gabrielle Vargas completed a comprehensive neuropsychological evaluation on 08/04/2022. Please refer to that encounter for the full report and recommendations. Briefly, results suggested impairment across several aspects of expressive language, namely verbal fluency (semantic worse than phonemic) and confrontation naming. Performance variability was exhibited across executive functioning, while an additional weakness was noted across inattention and vigilance aspects of a continuous performance task. The etiology for ongoing dysfunction is unclear at the present time. Neurologically speaking, recent neuroimaging revealed minimal microvascular ischemic changes, not suggestive of any prior infarcts or reason to suspect an underlying vascular etiology. A recent 1-hour EEG did not reveal epileptiform discharges during active monitoring. Performances across memory testing were not suggestive of Alzheimer's disease. Behavioral characteristics were not suggestive of Lewy body disease, another more rare parkinsonian condition, or frontotemporal lobar degeneration. Typical conversation was also not strongly suggestive of a non-fluent primary progressive aphasia or semantic dementia presentation. While expressive language dysfunction could raise concern for a logopenic aphasia presentation, overall cognitive patterns were not wholly consistent with this illness presently. Gabrielle Vargas does have a longstanding history of distractibility and trouble with sustained focus. Performance across a continuous performance task did align with  her previous diagnosis of ADHD. There remains the potential for attentional lapses to be a direct cause for word finding difficulties and instances where she may lose her train of thought or become disoriented in her day-to-day life. While a contribution from her history of several COVID-19 infections is plausible, there has yet to be a sufficient number of high quality longitudinal studies to truly understand the likelihood and extent of persisting cognitive dysfunction stemming from prior COVID-19 infection. Stating a direct link between expressive language dysfunction and COVID-19 experience with any degree of certainly would appear premature at the present time. Continued medical monitoring will be important moving forward.   Gabrielle Vargas was accompanied by her husband during the current feedback session. Content of the current session focused on the results of her neuropsychological evaluation. Gabrielle Vargas was given the opportunity to ask questions and her questions were answered. She was encouraged to reach out should additional questions arise. A copy of her report was provided at the conclusion of the visit.      22 minutes were spent conducting the current feedback session with Gabrielle Vargas, billed as one unit [phone].

## 2022-08-15 ENCOUNTER — Telehealth: Payer: Self-pay | Admitting: Adult Health

## 2022-08-15 NOTE — Telephone Encounter (Signed)
Requesting prescription for amphetamine-dextroamphetamine (ADDERALL) 20 MG tablet  amphetamine-dextroamphetamine (ADDERALL) 20 MG tablet  amphetamine-dextroamphetamine (ADDERALL) 20 MG tablet be sent to new pharmacy  [id number] - [place] [address] [place], [place] Phone: [phone]  Fax: [phone]

## 2022-08-16 ENCOUNTER — Other Ambulatory Visit: Payer: Self-pay | Admitting: Adult Health

## 2022-08-16 DIAGNOSIS — F909 Attention-deficit hyperactivity disorder, unspecified type: Secondary | ICD-10-CM

## 2022-08-16 MED ORDER — AMPHETAMINE-DEXTROAMPHETAMINE 20 MG PO TABS: 20.0000 mg | ORAL_TABLET | Freq: Two times a day (BID) | ORAL | 0 refills | Status: DC

## 2022-08-16 NOTE — Telephone Encounter (Signed)
Okay for refill?  

## 2022-08-17 NOTE — Telephone Encounter (Signed)
Noted  

## 2022-08-22 ENCOUNTER — Encounter: Payer: Self-pay | Admitting: Physician Assistant

## 2022-08-22 ENCOUNTER — Ambulatory Visit: Payer: PPO | Admitting: Physician Assistant

## 2022-08-22 VITALS — BP 153/87 | HR 93 | Resp 20 | Ht 59.5 in | Wt 110.0 lb

## 2022-08-22 DIAGNOSIS — R41840 Attention and concentration deficit: Secondary | ICD-10-CM

## 2022-08-22 DIAGNOSIS — G3184 Mild cognitive impairment, so stated: Secondary | ICD-10-CM

## 2022-08-22 NOTE — Progress Notes (Signed)
Assessment/Plan:   Mild cognitive impairment  Gabrielle Vargas is a very pleasant 72 y.o. RH female with  a history of ADHD, arthritis,  seasonal asthma, hypertension, hyperlipidemia, hypothyroidism, B12 deficiency, Prediabetes  presenting today in follow-up for evaluation of memory loss.  In review, the patient has been seen on 06/20/2022, after presenting to the ED with confusion, disorientation, which was negative for stroke or UTI.  MRI at that time was negative for acute findings, no artery occlusion or emergent finding for explanation of her symptoms.  MRA of the head was negative.  EEG was negative for seizures.  She attributed the worsening of memory to her recent COVID.  She was asked to return to reassess her memory after recovery.  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  She is not on antidementia medications at this time.     Recommendations:   Follow up in 1 year Recommend good control of cardiovascular risk factors Repeat Neuropsych evaluation in 18 to 24 months for clarity of the diagnosis and disease trajectory Recommend following up on her history of ADHD Continue to control mood as per PCP No antidementia meds are indicated at this time.      Subjective:   This patient is accompanied in the office by her husband who supplements the history. Previous records as well as any outside records available were reviewed prior to todays visit.   Patient was last seen on 06/20/2022 at which time her MoCA was 23/30    Any changes in memory since last visit?" I think there is an improvement, although still  has some trouble coming up  with words". Able to remember names most of the time.Does not feel confused as before. "In November was crazy" repeats oneself?  Endorsed, mostly with questions, but "depends if my husband has th hearing aids or not" Disoriented when walking into a room?  Patient denies except occasionally not remembering what patient came to the room for    Leaving objects in unusual places?  Patient denies   Wandering behavior?   denies   Any personality changes since last visit?  denies   Any worsening depression?: denies   Hallucinations or paranoia?  denies   Seizures?   denies    Any sleep changes?  She sleeps well, occasional vivid dreams, without REM behavior or sleepwalking   Sleep apnea?   denies   Any hygiene concerns?   denies   Independent of bathing and dressing?  Endorsed  Does the patient needs help with medications? Patient is in charge   Who is in charge of the finances? Husband has always been in charge     Any changes in appetite?  denies    Patient have trouble swallowing?  denies   Does the patient cook?  Yes, they cook together . No kitchen accidents reported  Any headaches?  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 Has seen an eye doctor and "everything was ok ! " Vision changes? denies Chronic back pain  denies   Ambulates with difficulty?   She walks 4-6 miles a day with her husband, enjoys playing golf and fishing Recent falls or  head injuries?   denies     Unilateral weakness, numbness or tingling?  denies   Any tremors?  denies   Any anosmia?  denies   Any incontinence of urine?  She has a history of urge incontinence. Any bowel dysfunction?  denies      Patient lives  with husband and daughter Does the patient drive? "Okay, I am cautious, I like to drive with my husband"   Initial evaluation 06/19/2022   How long did patient have memory difficulties?  For the last 2 years, the patient experienced some episodes of  disorientation, initially attributed to COVID, as she had it 3 times, the symptoms appear to have been worse during those times.  On 05/19/2022, 2 weeks after COVID, her husband took her to the grocery store, and she stood at the sandwich counter trying to decide what she should order, despite 15 or 20 minutes have been passed by.  She also reported that she could not completely breathe, since she had any blurred vision.  Her husband then sent her to another area of the store to shop for serial, and again, she could not make a decision, she cannot find what she came to look for.  In another event, at home, she was unable to remember where she kept items in the kitchen.  On Nov 11,  she was having a yard sale at her home, holding a plate and asked her daughter "what should I do with this " There were no episodes of slurred speech or facial droop or unilateral weakness with these episodes, she was just having word finding difficulty.  The patient reported at the time being disoriented, having a mild headache, and dizzy with blurry vision, for which she was sent to the ED.  CT scan of the head then was negative for acute abnormalities.  UA was negative.  On November 21, she was texting with her sister, but eventually she decided to talk with her on the phone, and felt that it was not making any sense.  The patient was aware of what was happening.  That last.  Lasted for about 1 hour.  In the interim, she has difficulty thinking of the word, or naming a kitchen tool, names of people and to a certain extent conversations that took place 2 to 3 days prior.   repeats oneself?  Endorsed her husband states that when he turned day prior load with, she asked "what is that smell ", 3 times (that was in November 21)  Ambulates  with difficulty?  Until recently, she and her husband walk 4-6 miles till this happened, he also enjoyed playing golf and fishing  Recent falls?  Patient denies   Any head injuries?  Patient denies    History of seizures?   Patient denies   Wandering behavior?  Patient denies   Patient drives?   Husband does not let her since the 11th  Any personality changes?  Patient recognizes that for the last 2 months, she has made "little comments "to her husband, which are not unusual for her  Any history of depression?:  Patient denies   Hallucinations or paranoia?  Denies hallucinations, but lately she has been felt "on the edge as if something is going to happen" Patient reports that sleeps well with occasional vivid dreams, but without REM behavior or sleepwalking    History of sleep apnea?  Patient denies   Any hygiene concerns?  Patient denies   Independent of bathing and  dressing?  Endorsed  Does the patient needs help with medications? Patient is in charge  Who is in charge of the finances?  Husband is in charge  (always)  Any changes in appetite?  s "A little less, I don't eat as much as I used to "   Patient have trouble swallowing? Patient denies   Does the patient cook? Any kitchen accidents such as leaving the stove on? We love to cook at home  Patient denies nay kitchen accidents.  Any headaches?  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  Her husband reports that she has just looked at her symptoms on the Internet and self diagnosed Double vision? Patient denies  I have been sent to ophthalmologist, but records are not available for review  any focal numbness or tingling?  Patient denies   Chronic back pain Patient denies   Unilateral weakness?  Patient denies   Any tremors?  Patient denies   Any history of anosmia?  Patient denies   Any incontinence of urine?  Urge incontinence  Any bowel dysfunction?   Patient denies    History of heavy alcohol intake?  Patient denies   History of heavy tobacco use?  Patient denies   Family history of dementia?    [place] Alzheimer's disease   Patient lives:  Husband and  daughter   Pertinent Labs B12 455, D34.12, CBC normal, A1c 6.4, total cholesterol 201, triglycerides 164, HDL normal LDL 107.  Neuropsychological evaluation 08/11/2022 "Briefly, results suggested impairment across several aspects of expressive language, namely verbal fluency (semantic worse than phonemic) and confrontation naming. Performance variability was exhibited across executive functioning, while an additional weakness was noted across inattention and vigilance aspects of a continuous performance task. The etiology for ongoing dysfunction is unclear at the present time. Neurologically speaking, recent neuroimaging revealed minimal microvascular ischemic changes, not suggestive of any prior infarcts or reason to suspect an underlying vascular etiology. A recent 1-hour EEG did not reveal epileptiform discharges during active monitoring. Performances across memory testing were not suggestive of Alzheimer's disease. Behavioral characteristics were not suggestive of Lewy body disease, another more rare parkinsonian condition, or frontotemporal lobar degeneration. Typical conversation was also not strongly suggestive of a non-fluent primary progressive aphasia or semantic dementia presentation. While expressive language dysfunction could raise concern for a logopenic aphasia presentation, overall cognitive patterns were not wholly consistent with this illness presently. Ms. Hirth does have a longstanding history of distractibility and trouble with sustained focus. Performance across a continuous performance task did align with her previous diagnosis of ADHD. There remains the potential for attentional lapses to be a direct cause for word finding difficulties and instances where she may lose her train of thought or become disoriented in her day-to-day life. While a contribution from her history of several COVID-19 infections is plausible, there has yet to be a sufficient number of high quality longitudinal  studies to truly understand the likelihood and extent of persisting cognitive dysfunction stemming from prior COVID-19 infection. Stating a direct link between expressive language dysfunction and COVID-19 experience with any degree of certainly would appear premature at the present time. Continued medical monitoring will be important moving forward.    Past Medical History:  Diagnosis Date   ADHD (attention deficit hyperactivity disorder) 05/09/2007   Diverticulitis of colon (without mention of hemorrhage) 09/17/2013   Essential hypertension 10/09/2017   History of COVID-19 2021   Hyperlipidemia  Hypothyroidism    Low back pain radiating to right leg 10/09/2017   Mild cognitive impairment of uncertain or unknown etiology 08/04/2022   Mild intermittent asthma with acute exacerbation 05/30/2017   Osteoarthritis    Postmenopausal atrophic vaginitis, severe 08/18/2008   Right knee pain 03/13/2014   S/P arthroscopy of right knee 06/03/2014   Vitamin B12 deficiency      Past Surgical History:  Procedure Laterality Date   ABDOMINAL HYSTERECTOMY     BREAST SURGERY     CARPAL TUNNEL RELEASE Bilateral    CERVICAL DISC ARTHROPLASTY     CESAREAN SECTION     x2   left shoulder surgery     MENISCUS REPAIR Right 2017   REDUCTION MAMMAPLASTY Bilateral    right shoulder     TONSILLECTOMY       PREVIOUS MEDICATIONS:   CURRENT MEDICATIONS:  Outpatient Encounter Medications as of 08/22/2022  Medication Sig   Acetylcysteine (NAC) 500 MG CAPS Take by mouth.   amphetamine-dextroamphetamine (ADDERALL) 20 MG tablet Take 1 tablet (20 mg total) by mouth 2 (two) times daily.   amphetamine-dextroamphetamine (ADDERALL) 20 MG tablet Take 1 tablet (20 mg total) by mouth 2 (two) times daily.   amphetamine-dextroamphetamine (ADDERALL) 20 MG tablet Take 1 tablet (20 mg total) by mouth 2 (two) times daily.   Ascorbic Acid (VITAMIN C) 1000 MG tablet Take 1,000 mg by mouth daily.   Cholecalciferol (D3-50 PO)  Take by mouth.   Evening Primrose Oil 1000 MG CAPS Take 1,300 mg by mouth in the morning and at bedtime.   ezetimibe (ZETIA) 10 MG tablet TAKE 1 TABLET(10 MG) BY MOUTH DAILY   hydrochlorothiazide (HYDRODIURIL) 25 MG tablet TAKE 1 TABLET(25 MG) BY MOUTH DAILY   ibuprofen (ADVIL,MOTRIN) 200 MG tablet Take 200 mg by mouth every 6 (six) hours as needed.   metFORMIN (GLUCOPHAGE) 500 MG tablet TAKE 2 TABLETS(1000 MG) BY MOUTH TWICE DAILY WITH A MEAL   simvastatin (ZOCOR) 20 MG tablet TAKE 1 TABLET(20 MG) BY MOUTH EVERY OTHER DAY   SYNTHROID 75 MCG tablet TAKE 1 TABLET(75 MCG) BY MOUTH DAILY   vitamin B-12 (CYANOCOBALAMIN) 1000 MCG tablet Take 1,000 mcg by mouth 2 (two) times a week.   Zinc 20 MG CAPS Take by mouth.   No facility-administered encounter medications on file as of 08/22/2022.     Objective:     PHYSICAL EXAMINATION:    VITALS:   Vitals:   08/22/22 0905  BP: (!) 153/87  Pulse: 93  Resp: 20  SpO2: 100%  Weight: 110 lb (49.9 kg)  Height: 4' 11.5" (1.511 m)    GEN:  The patient appears stated age and is in NAD. HEENT:  Normocephalic, atraumatic.   Neurological examination:  General: NAD, well-groomed, appears stated age. Orientation: The patient is alert. Oriented to person, place and date Cranial nerves: There is good facial symmetry.The speech is fluent and clear. No aphasia or dysarthria. Fund of knowledge is appropriate. Recent memory impaired and remote memory is normal.  Attention and concentration are normal.  Able to name objects and repeat phrases.  Hearing is intact to conversational tone.   Sensation: Sensation is intact to light touch throughout Motor: Strength is at least antigravity x4. Tremors: none  DTR's 2/4 in UE/LE      06/20/2022   12:00 PM  Montreal Cognitive Assessment   Visuospatial/ Executive (0/5) 5  Naming (0/3) 3  Attention: Read list of digits (0/2) 2  Attention: Read list of letters (0/1)  1  Attention: Serial 7 subtraction starting at  100 (0/3) 1  Language: Repeat phrase (0/2) 2  Language : Fluency (0/1) 0  Abstraction (0/2) 0  Delayed Recall (0/5) 3  Orientation (0/6) 6  Total 23  Adjusted Score (based on education) 23       06/02/2022   11:04 AM  MMSE - Mini Mental State Exam  Orientation to time 5  Orientation to Place 5  Registration 3  Attention/ Calculation 5  Recall 3  Language- name 2 objects 2  Language- repeat 1  Language- follow 3 step command 3  Language- read & follow direction 1  Write a sentence 1  Copy design 1  Total score 30       Movement examination: Tone: There is normal tone in the UE/LE Abnormal movements:  no tremor.  No myoclonus.  No asterixis.   Coordination:  There is no decremation with RAM's. Normal finger to nose  Gait and Station: The patient has no difficulty arising out of a deep-seated chair without the use of the hands. The patient's stride length is good.  Gait is cautious and narrow.   Thank you for allowing [place] the opportunity to participate in the care of this nice patient. Please do not hesitate to contact us for any questions or concerns.   Total time spent on today's visit was 33 minutes dedicated to this patient today, preparing to see patient, examining the patient, ordering tests and/or medications and counseling the patient, documenting clinical information in the EHR or other health record, independently interpreting results and communicating results to the patient/family, discussing treatment and goals, answering patient's questions and coordinating care.  Cc:  [name], NP  Sharene Butters 08/22/2022 10:03 AM

## 2022-08-22 NOTE — Patient Instructions (Signed)
It was a pleasure to see you today at our office.   Recommendations:  Follow up in 1 year  Control that blood pressure  Monitor ADHD  Whom to call:  Memory  decline, memory medications: Call our office [phone]   For psychiatric meds, mood meds: Please have your primary care physician manage these medications.        RECOMMENDATIONS FOR ALL PATIENTS WITH MEMORY PROBLEMS: 1. Continue to exercise (Recommend 30 minutes of walking everyday, or 3 hours every week) 2. Increase social interactions - continue going to [place] and enjoy social gatherings with friends and family 3. Eat healthy, avoid fried foods and eat more fruits and vegetables 4. Maintain adequate blood pressure, blood sugar, and blood cholesterol level. Reducing the risk of stroke and cardiovascular disease also helps promoting better memory. 5. Avoid stressful situations. Live a simple life and avoid aggravations. Organize your time and prepare for the next day in anticipation. 6. Sleep well, avoid any interruptions of sleep and avoid any distractions in the bedroom that may interfere with adequate sleep quality 7. Avoid sugar, avoid sweets as there is a strong link between excessive sugar intake, diabetes, and cognitive impairment We discussed the Mediterranean diet, which has been shown to help patients reduce the risk of progressive memory disorders and reduces cardiovascular risk. This includes eating fish, eat fruits and green leafy vegetables, nuts like almonds and hazelnuts, walnuts, and also use olive oil. Avoid fast foods and fried foods as much as possible. Avoid sweets and sugar as sugar use has been linked to worsening of memory function.  There is always a concern of gradual progression of memory problems. If this is the case, then we may need to adjust level of care according to patient needs. Support, both to the patient and caregiver, should then be put into place.    FALL PRECAUTIONS: Be cautious when  walking. Scan the area for obstacles that may increase the risk of trips and falls. When getting up in the mornings, sit up at the edge of the bed for a few minutes before getting out of bed. Consider elevating the bed at the head end to avoid drop of blood pressure when getting up. Walk always in a well-lit room (use night lights in the walls). Avoid area rugs or power cords from appliances in the middle of the walkways. Use a walker or a cane if necessary and consider physical therapy for balance exercise. Get your eyesight checked regularly.  FINANCIAL OVERSIGHT: Supervision, especially oversight when making financial decisions or transactions is also recommended.  HOME SAFETY: Consider the safety of the kitchen when operating appliances like stoves, microwave oven, and blender. Consider having supervision and share cooking responsibilities until no longer able to participate in those. Accidents with firearms and other hazards in the house should be identified and addressed as well.   ABILITY TO BE LEFT ALONE: If patient is unable to contact 911 operator, consider using LifeLine, or when the need is there, arrange for someone to stay with patients. Smoking is a fire hazard, consider supervision or cessation. Risk of wandering should be assessed by caregiver and if detected at any point, supervision and safe proof recommendations should be instituted.  MEDICATION SUPERVISION: Inability to self-administer medication needs to be constantly addressed. Implement a mechanism to ensure safe administration of the medications.   DRIVING: Regarding driving, in patients with progressive memory problems, driving will be impaired. We advise to have someone else do the driving if trouble finding  directions or if minor accidents are reported. Independent driving assessment is available to determine safety of driving.   If you are interested in the driving assessment, you can contact the following:  [facility]  [employer] in [place]  [place] [phone]  [place]  [facility] [phone] or [phone]

## 2022-08-30 ENCOUNTER — Ambulatory Visit: Payer: PPO | Admitting: Adult Health

## 2022-09-06 ENCOUNTER — Encounter: Payer: Self-pay | Admitting: Adult Health

## 2022-09-06 ENCOUNTER — Ambulatory Visit (INDEPENDENT_AMBULATORY_CARE_PROVIDER_SITE_OTHER): Payer: PPO | Admitting: Adult Health

## 2022-09-06 VITALS — BP 120/88 | HR 80 | Temp 97.6°F | Ht 59.5 in | Wt 109.0 lb

## 2022-09-06 DIAGNOSIS — I10 Essential (primary) hypertension: Secondary | ICD-10-CM | POA: Diagnosis not present

## 2022-09-06 DIAGNOSIS — E119 Type 2 diabetes mellitus without complications: Secondary | ICD-10-CM | POA: Diagnosis not present

## 2022-09-06 LAB — POCT GLYCOSYLATED HEMOGLOBIN (HGB A1C): Hemoglobin A1C: 5.6 % (ref 4.0–5.6)

## 2022-09-06 NOTE — Progress Notes (Signed)
Subjective:    Patient ID: Gabrielle Vargas, female    DOB: 12/14/1950, 72 y.o.   MRN: [id number]  HPI  72 year old female who  has a past medical history of ADHD (attention deficit hyperactivity disorder) (05/09/2007), Diverticulitis of colon (without mention of hemorrhage) (09/17/2013), Essential hypertension (10/09/2017), History of COVID-19 (2021), Hyperlipidemia, Hypothyroidism, Low back pain radiating to right leg (10/09/2017), Mild cognitive impairment of uncertain or unknown etiology (08/04/2022), Mild intermittent asthma with acute exacerbation (05/30/2017), Osteoarthritis, Postmenopausal atrophic vaginitis, severe (08/18/2008), Right knee pain (03/13/2014), S/P arthroscopy of right knee (06/03/2014), and Vitamin B12 deficiency.  She presents to the office today for follow up regarding prediabetes and HTN. She is currently prescribed Metformin 1000 mg BID. She is tolerating this medication well. Is eating healthy and exercising   Lab Results  Component Value Date   HGBA1C 5.6 09/06/2022   HTN - managed with HCTZ 25 mg daily. She denies dizziness, lightheadedness and blurred vision.   Review of Systems See HPI   Past Medical History:  Diagnosis Date   ADHD (attention deficit hyperactivity disorder) 05/09/2007   Diverticulitis of colon (without mention of hemorrhage) 09/17/2013   Essential hypertension 10/09/2017   History of COVID-19 2021   Hyperlipidemia    Hypothyroidism    Low back pain radiating to right leg 10/09/2017   Mild cognitive impairment of uncertain or unknown etiology 08/04/2022   Mild intermittent asthma with acute exacerbation 05/30/2017   Osteoarthritis    Postmenopausal atrophic vaginitis, severe 08/18/2008   Right knee pain 03/13/2014   S/P arthroscopy of right knee 06/03/2014   Vitamin B12 deficiency     Social History   Socioeconomic History   Marital status: Married    Spouse name: Not on file   Number of children: 2   Years of  education: 13   Highest education level: Some college, no degree  Occupational History   Occupation: Retired  Tobacco Use   Smoking status: Never   Smokeless tobacco: Never  Vaping Use   Vaping Use: Never used  Substance and Sexual Activity   Alcohol use: No   Drug use: No   Sexual activity: Not on file  Other Topics Concern   Not on file  Social History Narrative   Retired   Married   2 children   Right handed   Live with husband   Social Determinants of Health   Financial Resource Strain: Low Risk  (12/14/2021)   Overall Financial Resource Strain (CARDIA)    Difficulty of Paying Living Expenses: Not hard at all  Food Insecurity: No Food Insecurity (12/14/2021)   Hunger Vital Sign    Worried About Running Out of Food in the Last Year: Never true    Ran Out of Food in the Last Year: Never true  Transportation Needs: No Transportation Needs (12/14/2021)   PRAPARE - Hydrologist (Medical): No    Lack of Transportation (Non-Medical): No  Physical Activity: Sufficiently Active (12/14/2021)   Exercise Vital Sign    Days of Exercise per Week: 6 days    Minutes of Exercise per Session: 90 min  Stress: No Stress Concern Present (12/14/2021)   [address]    Feeling of Stress : Not at all  Social Connections: Moderately Integrated (12/03/2020)   Social Connection and Isolation Panel [NHANES]    Frequency of Communication with Friends and Family: Three times a week    Frequency  of Social Gatherings with Friends and Family: Three times a week    Attends Religious Services: More than 4 times per year    Active Member of Clubs or Organizations: No    Attends Archivist Meetings: Never    Marital Status: Married  Human resources officer Violence: Not At Risk (12/03/2020)   Humiliation, Afraid, Rape, and Kick questionnaire    Fear of Current or Ex-Partner: No    Emotionally Abused: No     Physically Abused: No    Sexually Abused: No    Past Surgical History:  Procedure Laterality Date   ABDOMINAL HYSTERECTOMY     BREAST SURGERY     CARPAL TUNNEL RELEASE Bilateral    CERVICAL [place]     x2   left shoulder surgery     MENISCUS REPAIR Right 2017   REDUCTION MAMMAPLASTY Bilateral    right shoulder     TONSILLECTOMY      Family History  Problem Relation Age of Onset   Melanoma Mother    Kidney disease Father    Diabetes Father    Heart disease Father    Breast cancer Paternal Grandmother    ADD / ADHD Other        multiple family members   Colon cancer Neg Hx    Esophageal cancer Neg Hx    Rectal cancer Neg Hx    Stomach cancer Neg Hx     Allergies  Allergen Reactions   Latex     REACTION: rash/hives   Oxycodone-Acetaminophen     REACTION: nausea    Current Outpatient Medications on File Prior to Visit  Medication Sig Dispense Refill   Acetylcysteine (NAC) 500 MG CAPS Take by mouth.     amphetamine-dextroamphetamine (ADDERALL) 20 MG tablet Take 1 tablet (20 mg total) by mouth 2 (two) times daily. 60 tablet 0   amphetamine-dextroamphetamine (ADDERALL) 20 MG tablet Take 1 tablet (20 mg total) by mouth 2 (two) times daily. 60 tablet 0   amphetamine-dextroamphetamine (ADDERALL) 20 MG tablet Take 1 tablet (20 mg total) by mouth 2 (two) times daily. 60 tablet 0   Ascorbic Acid (VITAMIN C) 1000 MG tablet Take 1,000 mg by mouth daily.     Cholecalciferol (D3-50 PO) Take by mouth.     Evening Primrose Oil 1000 MG CAPS Take 1,300 mg by mouth in the morning and at bedtime.     ezetimibe (ZETIA) 10 MG tablet TAKE 1 TABLET(10 MG) BY MOUTH DAILY 90 tablet 3   hydrochlorothiazide (HYDRODIURIL) 25 MG tablet TAKE 1 TABLET(25 MG) BY MOUTH DAILY 90 tablet 1   ibuprofen (ADVIL,MOTRIN) 200 MG tablet Take 200 mg by mouth every 6 (six) hours as needed.     metFORMIN (GLUCOPHAGE) 500 MG tablet TAKE 2 TABLETS(1000 MG) BY MOUTH TWICE DAILY WITH A  MEAL 360 tablet 0   simvastatin (ZOCOR) 20 MG tablet TAKE 1 TABLET(20 MG) BY MOUTH EVERY OTHER DAY 45 tablet 3   SYNTHROID 75 MCG tablet TAKE 1 TABLET(75 MCG) BY MOUTH DAILY 90 tablet 3   vitamin B-12 (CYANOCOBALAMIN) 1000 MCG tablet Take 1,000 mcg by mouth 2 (two) times a week.     Zinc 20 MG CAPS Take by mouth.     No current facility-administered medications on file prior to visit.    BP 120/88   Pulse 80   Temp 97.6 F (36.4 C) (Oral)   Ht 4' 11.5" (1.511 m)   Wt 109 lb (  49.4 kg)   SpO2 96%   BMI 21.65 kg/m       Objective:   Physical Exam Vitals and nursing note reviewed.  Constitutional:      Appearance: Normal appearance.  Cardiovascular:     Rate and Rhythm: Normal rate and regular rhythm.     Pulses: Normal pulses.     Heart sounds: Normal heart sounds.  Pulmonary:     Effort: Pulmonary effort is normal.     Breath sounds: Normal breath sounds.  Skin:    General: Skin is warm and dry.  Neurological:     General: No focal deficit present.     Mental Status: She is alert and oriented to person, place, and time.  Psychiatric:        Mood and Affect: Mood normal.        Behavior: Behavior normal.        Thought Content: Thought content normal.        Judgment: Judgment normal.           Assessment & Plan:   1. Diabetes mellitus without complication ([place])  - POC HgB A1c- 5.6  - No change in medication. Follow up in 6 months   2. Essential hypertension - Well controlled. No change in medication   [name], NP

## 2022-09-28 ENCOUNTER — Telehealth: Payer: Self-pay | Admitting: Adult Health

## 2022-09-28 DIAGNOSIS — E782 Mixed hyperlipidemia: Secondary | ICD-10-CM

## 2022-09-28 MED ORDER — METFORMIN HCL 500 MG PO TABS: ORAL_TABLET | ORAL | 1 refills | Status: DC

## 2022-09-28 MED ORDER — SIMVASTATIN 20 MG PO TABS: ORAL_TABLET | ORAL | 3 refills | Status: DC

## 2022-09-28 NOTE — Telephone Encounter (Signed)
Spoke to pt and advise to follow up in 6 months and to continue Metformin. Per last note pt advised to follow up in 63month.

## 2022-09-28 NOTE — Telephone Encounter (Signed)
Pt called to ask what she should do once she has finished her Metformin.  Pt stated she has about a week left.  Pt states NP told her to call, once she was done.  Please advise.

## 2022-10-04 ENCOUNTER — Other Ambulatory Visit: Payer: Self-pay | Admitting: Adult Health

## 2022-10-10 ENCOUNTER — Ambulatory Visit: Payer: Self-pay | Admitting: *Deleted

## 2022-10-10 ENCOUNTER — Ambulatory Visit (INDEPENDENT_AMBULATORY_CARE_PROVIDER_SITE_OTHER): Payer: PPO

## 2022-10-10 ENCOUNTER — Ambulatory Visit (INDEPENDENT_AMBULATORY_CARE_PROVIDER_SITE_OTHER): Payer: PPO | Admitting: Student

## 2022-10-10 ENCOUNTER — Other Ambulatory Visit (HOSPITAL_BASED_OUTPATIENT_CLINIC_OR_DEPARTMENT_OTHER): Payer: Self-pay

## 2022-10-10 DIAGNOSIS — M25512 Pain in left shoulder: Secondary | ICD-10-CM

## 2022-10-10 DIAGNOSIS — M542 Cervicalgia: Secondary | ICD-10-CM | POA: Diagnosis not present

## 2022-10-10 MED ORDER — METHOCARBAMOL 500 MG PO TABS
500.0000 mg | ORAL_TABLET | Freq: Four times a day (QID) | ORAL | 0 refills | Status: AC
  Filled 2022-10-10: qty 40, 10d supply, fill #0

## 2022-10-10 NOTE — Progress Notes (Unsigned)
Chief Complaint: Left-sided neck and shoulder pain     History of Present Illness:    NEKOLE Vargas is a 72 y.o. female with a history of cervical C5-C7 spinal fusion presenting to clinic for evaluation of left neck and shoulder pain after a fall down a hill this afternoon.  She reports that she was pulling weeds when she slipped and tumbled a few times.  She presents with pain over her left superior scapula that is 7/10.  She denies any numbness but occasionally has slight tingling into her left first and second fingers.  She is right-hand dominant.  She has not taken any pain medication since her fall.    Surgical History:   C5-C7 spinal fusion 20 years ago  PMH/PSH/Family History/Social History/Meds/Allergies:    Past Medical History:  Diagnosis Date   ADHD (attention deficit hyperactivity disorder) 05/09/2007   Diverticulitis of colon (without mention of hemorrhage) 09/17/2013   Essential hypertension 10/09/2017   History of COVID-19 2021   Hyperlipidemia    Hypothyroidism    Low back pain radiating to right leg 10/09/2017   Mild cognitive impairment of uncertain or unknown etiology 08/04/2022   Mild intermittent asthma with acute exacerbation 05/30/2017   Osteoarthritis    Postmenopausal atrophic vaginitis, severe 08/18/2008   Right knee pain 03/13/2014   S/P arthroscopy of right knee 06/03/2014   Vitamin B12 deficiency    Past Surgical History:  Procedure Laterality Date   ABDOMINAL HYSTERECTOMY     BREAST SURGERY     CARPAL TUNNEL RELEASE Bilateral    CERVICAL DISC ARTHROPLASTY     CESAREAN SECTION     x2   left shoulder surgery     MENISCUS REPAIR Right 2017   REDUCTION MAMMAPLASTY Bilateral    right shoulder     TONSILLECTOMY     Social History   Socioeconomic History   Marital status: Married    Spouse name: Not on file   Number of children: 2   Years of education: 13   Highest education level: Some college,  no degree  Occupational History   Occupation: Retired  Tobacco Use   Smoking status: Never   Smokeless tobacco: Never  Vaping Use   Vaping Use: Never used  Substance and Sexual Activity   Alcohol use: No   Drug use: No   Sexual activity: Not on file  Other Topics Concern   Not on file  Social History Narrative   Retired   Married   2 children   Right handed   Live with husband   Social Determinants of Health   Financial Resource Strain: Low Risk  (12/14/2021)   Overall Financial Resource Strain (CARDIA)    Difficulty of Paying Living Expenses: Not hard at all  Food Insecurity: No Food Insecurity (12/14/2021)   Hunger Vital Sign    Worried About Running Out of Food in the Last Year: Never true    Ran Out of Food in the Last Year: Never true  Transportation Needs: No Transportation Needs (12/14/2021)   PRAPARE - Hydrologist (Medical): No    Lack of Transportation (Non-Medical): No  Physical Activity: Sufficiently Active (12/14/2021)   Exercise Vital Sign    Days of Exercise per Week: 6 days    Minutes of Exercise per  Session: 90 min  Stress: No Stress Concern Present (12/14/2021)   [place]    Feeling of Stress : Not at all  Social Connections: Moderately Integrated (12/03/2020)   Social Connection and Isolation Panel [NHANES]    Frequency of Communication with Friends and Family: Three times a week    Frequency of Social Gatherings with Friends and Family: Three times a week    Attends Religious Services: More than 4 times per year    Active Member of Clubs or Organizations: No    Attends Archivist Meetings: Never    Marital Status: Married   Family History  Problem Relation Age of Onset   Melanoma Mother    Kidney disease Father    Diabetes Father    Heart disease Father    Breast cancer Paternal Grandmother    ADD / ADHD Other        multiple family members    Colon cancer Neg Hx    Esophageal cancer Neg Hx    Rectal cancer Neg Hx    Stomach cancer Neg Hx    Allergies  Allergen Reactions   Latex     REACTION: rash/hives   Oxycodone-Acetaminophen     REACTION: nausea   Current Outpatient Medications  Medication Sig Dispense Refill   methocarbamol (ROBAXIN) 500 MG tablet Take 1 tablet (500 mg total) by mouth 4 (four) times daily for 10 days. 40 tablet 0   Acetylcysteine (NAC) 500 MG CAPS Take by mouth.     amphetamine-dextroamphetamine (ADDERALL) 20 MG tablet Take 1 tablet (20 mg total) by mouth 2 (two) times daily. 60 tablet 0   amphetamine-dextroamphetamine (ADDERALL) 20 MG tablet Take 1 tablet (20 mg total) by mouth 2 (two) times daily. 60 tablet 0   amphetamine-dextroamphetamine (ADDERALL) 20 MG tablet Take 1 tablet (20 mg total) by mouth 2 (two) times daily. 60 tablet 0   Ascorbic Acid (VITAMIN C) 1000 MG tablet Take 1,000 mg by mouth daily.     Cholecalciferol (D3-50 PO) Take by mouth.     Evening Primrose Oil 1000 MG CAPS Take 1,300 mg by mouth in the morning and at bedtime.     ezetimibe (ZETIA) 10 MG tablet TAKE 1 TABLET(10 MG) BY MOUTH DAILY 90 tablet 3   hydrochlorothiazide (HYDRODIURIL) 25 MG tablet TAKE 1 TABLET(25 MG) BY MOUTH DAILY 90 tablet 1   ibuprofen (ADVIL,MOTRIN) 200 MG tablet Take 200 mg by mouth every 6 (six) hours as needed.     metFORMIN (GLUCOPHAGE) 500 MG tablet TAKE 2 TABLETS(1000 MG) BY MOUTH TWICE DAILY WITH A MEAL 360 tablet 1   simvastatin (ZOCOR) 20 MG tablet TAKE 1 TABLET(20 MG) BY MOUTH EVERY OTHER DAY 45 tablet 3   SYNTHROID 75 MCG tablet TAKE 1 TABLET(75 MCG) BY MOUTH DAILY 90 tablet 3   vitamin B-12 (CYANOCOBALAMIN) 1000 MCG tablet Take 1,000 mcg by mouth 2 (two) times a week.     Zinc 20 MG CAPS Take by mouth.     No current facility-administered medications for this visit.   No results found.  Review of Systems:   A ROS was performed including pertinent positives and negatives as documented in  the HPI.  Physical Exam :   Constitutional: NAD and appears stated age Neurological: Alert and oriented Psych: Appropriate affect and cooperative There were no vitals taken for this visit.   Comprehensive Musculoskeletal Exam:    ***  Imaging:   Xray (***): ***  I personally reviewed and interpreted the radiographs.   Assessment:   72 y.o. female ***  Plan :    - ***     I personally saw and evaluated the patient, and participated in the management and treatment plan.  Marnee Spring, PA-C Orthopedics  This document was dictated using Systems analyst. A reasonable attempt at proof reading has been made to minimize errors.

## 2022-10-10 NOTE — Telephone Encounter (Signed)
  Chief Complaint: [place] and now her neck is hurting. Symptoms: Neck is hurting.   She has a rod in it from surgery years ago.   She is concerned she has injured it. Frequency: Just a few minutes ago Pertinent Negatives: Patient denies Any other injuries. Disposition: [x] ED /[] Urgent Care (no appt availability in office) / [] Appointment(In office/virtual)/ []  [facility]/ [] Home Care/ [] Refused Recommended Disposition /[] [place] [facility]/ []  Follow-up with PCP Additional Notes: I have referred her to the new Orthopedic Walk in clinic at the [place] location ED.   I gave her the number so she could call and make an appt    or they can walk in.

## 2022-10-10 NOTE — Telephone Encounter (Signed)
Reason for Disposition  Injury (or injuries) that need emergency care    Referred to the [facility] in [facility].    I suggested they call their PCP and see if he can see her and do x rays.   If not go to the new Ortho clinic.  Answer Assessment - Initial Assessment Questions 1. MECHANISM: "How did the fall happen?"     We were working pulling weeds on a hill behind our house.  I rolled head over heels.    My foot slipped and I fell on the hill.   20 yrs ago I had neck surgery.   I have a rod in my neck.    I'm concerned.   Should I get an x ray  I have ice on it.   2. DOMESTIC VIOLENCE AND ELDER ABUSE SCREENING: "Did you fall because someone pushed you or tried to hurt you?" If Yes, ask: "Are you safe now?"     N/A 3. ONSET: "When did the fall happen?" (e.g., minutes, hours, or days ago)     A few minutes a go.   4. LOCATION: "What part of the body hit the ground?" (e.g., back, buttocks, head, hips, knees, hands, head, stomach)     I rolled head over heels down the hill.    My husband stopped me. 5. INJURY: "Did you hurt (injure) yourself when you fell?" If Yes, ask: "What did you injure? Tell me more about this?" (e.g., body area; type of injury; pain severity)"     My neck is hurting.   I have ice on it now.   I'm concerned because I had neck surgery years ago and have a rod in my neck.   I hope I haven't done something to it. 6. PAIN: "Is there any pain?" If Yes, ask: "How bad is the pain?" (e.g., Scale 1-10; or mild,  moderate, severe)   - NONE (0): No pain   - MILD (1-3): Doesn't interfere with normal activities    - MODERATE (4-7): Interferes with normal activities or awakens from sleep    - SEVERE (8-10): Excruciating pain, unable to do any normal activities      Yes    I'm keeping ice on it. 7. SIZE: For cuts, bruises, or swelling, ask: "How large is it?" (e.g., inches or centimeters)      No outside injuries. 8. PREGNANCY: "Is there any chance you are  pregnant?" "When was your last menstrual period?"     N/A 9. OTHER SYMPTOMS: "Do you have any other symptoms?" (e.g., dizziness, fever, weakness; new onset or worsening).      No when I asked.   Neck is the only thing bothering her. 10. CAUSE: "What do you think caused the fall (or falling)?" (e.g., tripped, dizzy spell)       I was working on a hill by our house pulling weeds and all and my foot slipped.   I rolled head over heels until my husband stopped me.    I'm not hurt anywhere else.  Protocols used: Falls and [employer] [facility]

## 2022-10-11 DIAGNOSIS — D225 Melanocytic nevi of trunk: Secondary | ICD-10-CM | POA: Diagnosis not present

## 2022-10-11 DIAGNOSIS — L814 Other melanin hyperpigmentation: Secondary | ICD-10-CM | POA: Diagnosis not present

## 2022-12-21 ENCOUNTER — Other Ambulatory Visit: Payer: Self-pay | Admitting: Adult Health

## 2022-12-23 ENCOUNTER — Ambulatory Visit (INDEPENDENT_AMBULATORY_CARE_PROVIDER_SITE_OTHER): Payer: PPO

## 2022-12-23 VITALS — BP 120/62 | HR 74 | Temp 97.7°F | Ht 59.5 in | Wt 106.0 lb

## 2022-12-23 DIAGNOSIS — Z0000 Encounter for general adult medical examination without abnormal findings: Secondary | ICD-10-CM | POA: Diagnosis not present

## 2022-12-23 NOTE — Progress Notes (Signed)
Subjective:   Gabrielle Vargas is a 72 y.o. female who presents for Medicare Annual (Subsequent) preventive examination.  Review of Systems     Cardiac Risk Factors include: advanced age (>61men, >4 women);hypertension     Objective:    Today's Vitals   12/23/22 1230  BP: 120/62  Pulse: 74  Temp: 97.7 F (36.5 C)  TempSrc: Oral  SpO2: 96%  Weight: 106 lb (48.1 kg)  Height: 4' 11.5" (1.511 m)   Body mass index is 21.05 kg/m.     12/23/2022   12:50 PM 08/22/2022    9:05 AM 06/20/2022    9:52 AM 05/28/2022    3:42 PM 12/14/2021    9:21 AM 12/03/2020    9:51 AM 10/16/2017    9:12 AM  Advanced Directives  Does Patient Have a Medical Advance Directive? Yes Yes Yes No Yes Yes Yes  Type of Estate agent of [place];Living will    Healthcare Power of [place];Living will Healthcare Power of [place];Living will Living will;Healthcare Power of Attorney  Does patient want to make changes to medical advance directive?      No - Patient declined No - Patient declined  Copy of Healthcare Power of Attorney in Chart? No - copy requested    No - copy requested No - copy requested No - copy requested  Would patient like information on creating a medical advance directive?    No - Patient declined   No - Patient declined    Current Medications (verified) Outpatient Encounter Medications as of 12/23/2022  Medication Sig   Acetylcysteine (NAC) 500 MG CAPS Take by mouth.   amphetamine-dextroamphetamine (ADDERALL) 20 MG tablet Take 1 tablet (20 mg total) by mouth 2 (two) times daily.   amphetamine-dextroamphetamine (ADDERALL) 20 MG tablet Take 1 tablet (20 mg total) by mouth 2 (two) times daily.   amphetamine-dextroamphetamine (ADDERALL) 20 MG tablet Take 1 tablet (20 mg total) by mouth 2 (two) times daily.   Ascorbic Acid (VITAMIN C) 1000 MG tablet Take 1,000 mg by mouth daily.   Cholecalciferol (D3-50 PO) Take by mouth.   Evening Primrose Oil 1000 MG CAPS Take 1,300 mg  by mouth in the morning and at bedtime.   ezetimibe (ZETIA) 10 MG tablet TAKE 1 TABLET BY MOUTH DAILY   hydrochlorothiazide (HYDRODIURIL) 25 MG tablet TAKE 1 TABLET BY MOUTH DAILY   ibuprofen (ADVIL,MOTRIN) 200 MG tablet Take 200 mg by mouth every 6 (six) hours as needed.   metFORMIN (GLUCOPHAGE) 500 MG tablet TAKE 2 TABLETS(1000 MG) BY MOUTH TWICE DAILY WITH A MEAL   simvastatin (ZOCOR) 20 MG tablet TAKE 1 TABLET(20 MG) BY MOUTH EVERY OTHER DAY   SYNTHROID 75 MCG tablet TAKE 1 TABLET BY MOUTH DAILY   vitamin B-12 (CYANOCOBALAMIN) 1000 MCG tablet Take 1,000 mcg by mouth 2 (two) times a week.   Zinc 20 MG CAPS Take by mouth.   No facility-administered encounter medications on file as of 12/23/2022.    Allergies (verified) Latex and Oxycodone-acetaminophen   History: Past Medical History:  Diagnosis Date   ADHD (attention deficit hyperactivity disorder) 05/09/2007   Diverticulitis of colon (without mention of hemorrhage) 09/17/2013   Essential hypertension 10/09/2017   History of COVID-19 2021   Hyperlipidemia    Hypothyroidism    Low back pain radiating to right leg 10/09/2017   Mild cognitive impairment of uncertain or unknown etiology 08/04/2022   Mild intermittent asthma with acute exacerbation 05/30/2017   Osteoarthritis    Postmenopausal  atrophic vaginitis, severe 08/18/2008   Right knee pain 03/13/2014   S/P arthroscopy of right knee 06/03/2014   Vitamin B12 deficiency    Past Surgical History:  Procedure Laterality Date   ABDOMINAL HYSTERECTOMY     BREAST SURGERY     CARPAL TUNNEL RELEASE Bilateral    CERVICAL DISC ARTHROPLASTY     CESAREAN SECTION     x2   left shoulder surgery     MENISCUS REPAIR Right 2017   REDUCTION MAMMAPLASTY Bilateral    right shoulder     TONSILLECTOMY     Family History  Problem Relation Age of Onset   Melanoma Mother    Kidney disease Father    Diabetes Father    Heart disease Father    Breast cancer Paternal Grandmother    ADD /  ADHD Other        multiple family members   Colon cancer Neg Hx    Esophageal cancer Neg Hx    Rectal cancer Neg Hx    Stomach cancer Neg Hx    Social History   Socioeconomic History   Marital status: Married    Spouse name: Not on file   Number of children: 2   Years of education: 13   Highest education level: Some college, no degree  Occupational History   Occupation: Retired  Tobacco Use   Smoking status: Never   Smokeless tobacco: Never  Vaping Use   Vaping Use: Never used  Substance and Sexual Activity   Alcohol use: No   Drug use: No   Sexual activity: Not on file  Other Topics Concern   Not on file  Social History Narrative   Retired   Married   2 children   Right handed   Live with husband   Social Determinants of Health   Financial Resource Strain: Low Risk  (12/23/2022)   Overall Financial Resource Strain (CARDIA)    Difficulty of Paying Living Expenses: Not hard at all  Food Insecurity: No Food Insecurity (12/23/2022)   Hunger Vital Sign    Worried About Running Out of Food in the Last Year: Never true    Ran Out of Food in the Last Year: Never true  Transportation Needs: No Transportation Needs (12/23/2022)   PRAPARE - Administrator, Civil Service (Medical): No    Lack of Transportation (Non-Medical): No  Physical Activity: Sufficiently Active (12/23/2022)   Exercise Vital Sign    Days of Exercise per Week: 7 days    Minutes of Exercise per Session: 60 min  Stress: No Stress Concern Present (12/23/2022)   [employer] of Occupational Health - Occupational Stress Questionnaire    Feeling of Stress : Not at all  Social Connections: Socially Integrated (12/23/2022)   Social Connection and Isolation Panel [NHANES]    Frequency of Communication with Friends and Family: More than three times a week    Frequency of Social Gatherings with Friends and Family: More than three times a week    Attends Religious Services: More than 4 times per year     Active Member of [employer] or Organizations: Yes    Attends Engineer, structural: More than 4 times per year    Marital Status: Married    Tobacco Counseling Counseling given: Not Answered   Clinical Intake:  Pre-visit preparation completed: Yes  Pain : No/denies pain     BMI - recorded: 21.05 Nutritional Status: BMI of 19-24  Normal Nutritional Risks: None Diabetes:  No  How often do you need to have someone help you when you read instructions, pamphlets, or other written materials from your doctor or pharmacy?: 1 - Never  Diabetic?  No  Interpreter Needed?: No  Information entered by :: Theresa Mulligan LPN   Activities of Daily Living    12/23/2022   12:47 PM  In your present state of health, do you have any difficulty performing the following activities:  Hearing? 0  Vision? 0  Difficulty concentrating or making decisions? 0  Walking or climbing stairs? 0  Dressing or bathing? 0  Doing errands, shopping? 0  Preparing Food and eating ? N  Using the Toilet? N  In the past six months, have you accidently leaked urine? N  Do you have problems with loss of bowel control? N  Managing your Medications? N  Managing your Finances? N  Housekeeping or managing your Housekeeping? N    Patient Care Team: Shirline Frees, NP as PCP - General (Family Medicine) Verner Chol, [facility] (Inactive) as Pharmacist (Pharmacist) Elwyn Reach (Neurology)  Indicate any recent Medical Services you may have received from other than [facility] providers in the past year (date may be approximate).     Assessment:   This is a routine wellness examination for [place].  Hearing/Vision screen Hearing Screening - Comments:: Denies hearing difficulties   Vision Screening - Comments:: Wears rx glasses - up to date with routine eye exams with  Dr Shea Evans  Dietary issues and exercise activities discussed: Exercise limited by: None identified   Goals Addressed                This Visit's Progress     No current goals (pt-stated)         Depression Screen    12/23/2022   12:46 PM 12/14/2021    9:22 AM 08/17/2021    8:55 AM 12/03/2020    9:53 AM 02/05/2020    8:30 AM 10/09/2017    9:04 AM 09/19/2016    1:11 PM  PHQ 2/9 Scores  PHQ - 2 Score 0 0 0 0 0 0 0  PHQ- 9 Score   3        Fall Risk    12/23/2022   12:49 PM 08/22/2022    9:04 AM 06/20/2022    9:52 AM 12/14/2021    9:22 AM 08/17/2021    9:02 AM  Fall Risk   Falls in the past year? 1 0 0 0 0  Number falls in past yr: 0 0 0 0 0  Injury with Fall? 0 0 0 0 0  Comment Followed by medical attention      Risk for fall due to : No Fall Risks   Medication side effect   Follow up Falls prevention discussed Falls evaluation completed Falls evaluation completed Falls evaluation completed;Education provided;Falls prevention discussed     FALL RISK PREVENTION PERTAINING TO THE HOME:  Any stairs in or around the home? Yes  If so, are there any without handrails? No  Home free of loose throw rugs in walkways, pet beds, electrical cords, etc? Yes  Adequate lighting in your home to reduce risk of falls? Yes   ASSISTIVE DEVICES UTILIZED TO PREVENT FALLS:  Life alert? No  Use of a cane, walker or w/c? No  Grab bars in the bathroom? Yes  Shower chair or bench in shower? No  Elevated toilet seat or a handicapped toilet? No   TIMED UP AND GO:  Was the test performed? Yes . - Length of time to ambulate 10 feet: 10 sec.   Gait steady and fast without use of assistive device  Cognitive Function:    06/02/2022   11:04 AM  MMSE - Mini Mental State Exam  Orientation to time 5  Orientation to Place 5  Registration 3  Attention/ Calculation 5  Recall 3  Language- name 2 objects 2  Language- repeat 1  Language- follow 3 step command 3  Language- read & follow direction 1  Write a sentence 1  Copy design 1  Total score 30      06/20/2022   12:00 PM  Montreal Cognitive Assessment   Visuospatial/ Executive  (0/5) 5  Naming (0/3) 3  Attention: Read list of digits (0/2) 2  Attention: Read list of letters (0/1) 1  Attention: Serial 7 subtraction starting at 100 (0/3) 1  Language: Repeat phrase (0/2) 2  Language : Fluency (0/1) 0  Abstraction (0/2) 0  Delayed Recall (0/5) 3  Orientation (0/6) 6  Total 23  Adjusted Score (based on education) 23      12/23/2022   12:50 PM 12/14/2021    9:26 AM  6CIT Screen  What Year? 0 points 0 points  What month? 0 points 0 points  What time? 0 points 0 points  Count back from 20 0 points 0 points  Months in reverse 0 points 0 points  Repeat phrase 0 points 0 points  Total Score 0 points 0 points    Immunizations Immunization History  Administered Date(s) Administered   Influenza Split 05/08/2012, 04/01/2013   Influenza Whole 05/09/2007, 04/22/2008, 04/17/2009, 04/19/2010   Influenza, High Dose Seasonal PF 04/29/2017, 04/19/2018, 04/17/2020, 04/21/2021   Influenza-Unspecified 03/19/2014, 03/31/2015   Pneumococcal Conjugate-13 10/16/2013   Pneumococcal Polysaccharide-23 08/18/2015   Td 01/03/2005   Tdap 01/27/2016   Zoster Recombinat (Shingrix) 02/10/2021, 07/26/2021   Zoster, Live 03/31/2015    TDAP status: Up to date  Flu Vaccine status: Up to date  Pneumococcal vaccine status: Up to date  Covid-19 vaccine status: Declined, Education has been provided regarding the importance of this vaccine but patient still declined. Advised may receive this vaccine at local pharmacy or Health Dept.or vaccine clinic. Aware to provide a copy of the vaccination record if obtained from local pharmacy or Health Dept. Verbalized acceptance and understanding.  Qualifies for Shingles Vaccine? Yes   Zostavax completed Yes   Shingrix Completed?: Yes  Screening Tests Health Maintenance  Topic Date Due   Diabetic kidney evaluation - Urine ACR  12/24/2022 (Originally 10/05/1968)   COVID-19 Vaccine (1) 01/08/2023 (Originally 04/06/1951)   INFLUENZA VACCINE   02/16/2023   MAMMOGRAM  04/07/2023   Diabetic kidney evaluation - eGFR measurement  06/01/2023   Colonoscopy  10/12/2023   Medicare Annual Wellness (AWV)  12/23/2023   DTaP/Tdap/Td (3 - Td or Tdap) 01/26/2026   Pneumonia Vaccine 73+ Years old  Completed   DEXA SCAN  Completed   Hepatitis C Screening  Completed   Zoster Vaccines- Shingrix  Completed   HPV VACCINES  Aged Out    Health Maintenance  There are no preventive care reminders to display for this patient.   Colorectal cancer screening: Type of screening: Colonoscopy. Completed 10/11/13. Repeat every 10 years  Mammogram status: Completed 04/06/22. Repeat every year  Bone Density status: Completed 02/12/19. Results reflect: Bone density results: OSTEOPOROSIS. Repeat every   years.  Lung Cancer Screening: (Low Dose CT Chest recommended if Age 52-80 years, 63  pack-year currently smoking OR have quit w/in 15years.) does not qualify.     Additional Screening:  Hepatitis C Screening: does qualify; Completed 02/05/20  Vision Screening: Recommended annual ophthalmology exams for early detection of glaucoma and other disorders of the eye. Is the patient up to date with their annual eye exam?  Yes  Who is the provider or what is the name of the office in which the patient attends annual eye exams? Dr Shea Evans If pt is not established with a provider, would they like to be referred to a provider to establish care? Yes .   Dental Screening: Recommended annual dental exams for proper oral hygiene  Community Resource Referral / Chronic Care Management:  CRR required this visit?  No   CCM required this visit?  No      Plan:     I have personally reviewed and noted the following in the patient's chart:   Medical and social history Use of alcohol, tobacco or illicit drugs  Current medications and supplements including opioid prescriptions. Patient is not currently taking opioid prescriptions. Functional ability and  status Nutritional status Physical activity Advanced directives List of other physicians Hospitalizations, surgeries, and ER visits in previous 12 months Vitals Screenings to include cognitive, depression, and falls Referrals and appointments  In addition, I have reviewed and discussed with patient certain preventive protocols, quality metrics, and best practice recommendations. A written personalized care plan for preventive services as well as general preventive health recommendations were provided to patient.     Tillie Rung, LPN   07/23/1094   Nurse Notes:   Patient due Diabetic kidney evaluation- Urine ACR

## 2022-12-23 NOTE — Patient Instructions (Addendum)
Gabrielle Vargas , Thank you for taking time to come for your Medicare Wellness Visit. I appreciate your ongoing commitment to your health goals. Please review the following plan we discussed and let me know if I can assist you in the future.   These are the goals we discussed:  Goals       Exercise 3x per week (30 min per time)      No current goals (pt-stated)      Patient Stated      12/14/2021, stay healthy        This is a list of the screening recommended for you and due dates:  Health Maintenance  Topic Date Due   Yearly kidney health urinalysis for diabetes  12/24/2022*   COVID-19 Vaccine (1) 01/08/2023*   Flu Shot  02/16/2023   Mammogram  04/07/2023   Yearly kidney function blood test for diabetes  06/01/2023   Colon Cancer Screening  10/12/2023   Medicare Annual Wellness Visit  12/23/2023   DTaP/Tdap/Td vaccine (3 - Td or Tdap) 01/26/2026   Pneumonia Vaccine  Completed   DEXA scan (bone density measurement)  Completed   Hepatitis C Screening  Completed   Zoster (Shingles) Vaccine  Completed   HPV Vaccine  Aged Out  *Topic was postponed. The date shown is not the original due date.    Advanced directives: Please bring a copy of your health care power of attorney and living will to the office to be added to your chart at your convenience.   Conditions/risks identified: None  Next appointment: Follow up in one year for your annual wellness visit    Preventive Care 65 Years and Older, Female Preventive care refers to lifestyle choices and visits with your health care provider that can promote health and wellness. What does preventive care include? A yearly physical exam. This is also called an annual well check. Dental exams once or twice a year. Routine eye exams. Ask your health care provider how often you should have your eyes checked. Personal lifestyle choices, including: Daily care of your teeth and gums. Regular physical activity. Eating a healthy  diet. Avoiding tobacco and drug use. Limiting alcohol use. Practicing safe sex. Taking low-dose aspirin every day. Taking vitamin and mineral supplements as recommended by your health care provider. What happens during an annual well check? The services and screenings done by your health care provider during your annual well check will depend on your age, overall health, lifestyle risk factors, and family history of disease. Counseling  Your health care provider may ask you questions about your: Alcohol use. Tobacco use. Drug use. Emotional well-being. Home and relationship well-being. Sexual activity. Eating habits. History of falls. Memory and ability to understand (cognition). Work and work Astronomer. Reproductive health. Screening  You may have the following tests or measurements: Height, weight, and BMI. Blood pressure. Lipid and cholesterol levels. These may be checked every 5 years, or more frequently if you are over 67 years old. Skin check. Lung cancer screening. You may have this screening every year starting at age 38 if you have a 30-pack-year history of smoking and currently smoke or have quit within the past 15 years. Fecal occult blood test (FOBT) of the stool. You may have this test every year starting at age 42. Flexible sigmoidoscopy or colonoscopy. You may have a sigmoidoscopy every 5 years or a colonoscopy every 10 years starting at age 57. Hepatitis C blood test. Hepatitis B blood test. Sexually transmitted disease (STD)  testing. Diabetes screening. This is done by checking your blood sugar (glucose) after you have not eaten for a while (fasting). You may have this done every 1-3 years. Bone density scan. This is done to screen for osteoporosis. You may have this done starting at age 13. Mammogram. This may be done every 1-2 years. Talk to your health care provider about how often you should have regular mammograms. Talk with your health care provider about  your test results, treatment options, and if necessary, the need for more tests. Vaccines  Your health care provider may recommend certain vaccines, such as: Influenza vaccine. This is recommended every year. Tetanus, diphtheria, and acellular pertussis (Tdap, Td) vaccine. You may need a Td booster every 10 years. Zoster vaccine. You may need this after age 65. Pneumococcal 13-valent conjugate (PCV13) vaccine. One dose is recommended after age 52. Pneumococcal polysaccharide (PPSV23) vaccine. One dose is recommended after age 69. Talk to your health care provider about which screenings and vaccines you need and how often you need them. This information is not intended to replace advice given to you by your health care provider. Make sure you discuss any questions you have with your health care provider. Document Released: 07/31/2015 Document Revised: 03/23/2016 Document Reviewed: 05/05/2015 Elsevier Interactive Patient Education  2017 [place] Prevention in the Home Falls can cause injuries. They can happen to people of all ages. There are many things you can do to make your home safe and to help prevent falls. What can I do on the outside of my home? Regularly fix the edges of walkways and driveways and fix any cracks. Remove anything that might make you trip as you walk through a door, such as a raised step or threshold. Trim any bushes or trees on the path to your home. Use bright outdoor lighting. Clear any walking paths of anything that might make someone trip, such as rocks or tools. Regularly check to see if handrails are loose or broken. Make sure that both sides of any steps have handrails. Any raised decks and porches should have guardrails on the edges. Have any leaves, snow, or ice cleared regularly. Use sand or salt on walking paths during winter. Clean up any spills in your garage right away. This includes oil or grease spills. What can I do in the bathroom? Use  night lights. Install grab bars by the toilet and in the tub and shower. Do not use towel bars as grab bars. Use non-skid mats or decals in the tub or shower. If you need to sit down in the shower, use a plastic, non-slip stool. Keep the floor dry. Clean up any water that spills on the floor as soon as it happens. Remove soap buildup in the tub or shower regularly. Attach bath mats securely with double-sided non-slip rug tape. Do not have throw rugs and other things on the floor that can make you trip. What can I do in the bedroom? Use night lights. Make sure that you have a light by your bed that is easy to reach. Do not use any sheets or blankets that are too big for your bed. They should not hang down onto the floor. Have a firm chair that has side arms. You can use this for support while you get dressed. Do not have throw rugs and other things on the floor that can make you trip. What can I do in the kitchen? Clean up any spills right away. Avoid walking on wet  floors. Keep items that you use a lot in easy-to-reach places. If you need to reach something above you, use a strong step stool that has a grab bar. Keep electrical cords out of the way. Do not use floor polish or wax that makes floors slippery. If you must use wax, use non-skid floor wax. Do not have throw rugs and other things on the floor that can make you trip. What can I do with my stairs? Do not leave any items on the stairs. Make sure that there are handrails on both sides of the stairs and use them. Fix handrails that are broken or loose. Make sure that handrails are as long as the stairways. Check any carpeting to make sure that it is firmly attached to the stairs. Fix any carpet that is loose or worn. Avoid having throw rugs at the top or bottom of the stairs. If you do have throw rugs, attach them to the floor with carpet tape. Make sure that you have a light switch at the top of the stairs and the bottom of the  stairs. If you do not have them, ask someone to add them for you. What else can I do to help prevent falls? Wear shoes that: Do not have high heels. Have rubber bottoms. Are comfortable and fit you well. Are closed at the toe. Do not wear sandals. If you use a stepladder: Make sure that it is fully opened. Do not climb a closed stepladder. Make sure that both sides of the stepladder are locked into place. Ask someone to hold it for you, if possible. Clearly mark and make sure that you can see: Any grab bars or handrails. First and last steps. Where the edge of each step is. Use tools that help you move around (mobility aids) if they are needed. These include: Canes. Walkers. Scooters. Crutches. Turn on the lights when you go into a dark area. Replace any light bulbs as soon as they burn out. Set up your furniture so you have a clear path. Avoid moving your furniture around. If any of your floors are uneven, fix them. If there are any pets around you, be aware of where they are. Review your medicines with your doctor. Some medicines can make you feel dizzy. This can increase your chance of falling. Ask your doctor what other things that you can do to help prevent falls. This information is not intended to replace advice given to you by your health care provider. Make sure you discuss any questions you have with your health care provider. Document Released: 04/30/2009 Document Revised: 12/10/2015 Document Reviewed: 08/08/2014 Elsevier Interactive Patient Education  2017 [employer].

## 2023-02-15 ENCOUNTER — Encounter (INDEPENDENT_AMBULATORY_CARE_PROVIDER_SITE_OTHER): Payer: Self-pay

## 2023-02-20 ENCOUNTER — Telehealth: Payer: Self-pay | Admitting: Adult Health

## 2023-02-20 NOTE — Telephone Encounter (Signed)
Prescription Request  02/20/2023  LOV: 09/06/2022  What is the name of the medication or equipment? amphetamine-dextroamphetamine (ADDERALL) 20 MG tablet  amphetamine-dextroamphetamine (ADDERALL) 20 MG tablet  amphetamine-dextroamphetamine (ADDERALL) 20 MG tabl  Have you contacted your pharmacy to request a refill? No   Which pharmacy would you like this sent to?  [facility] [id number] [place], [place] - [address] [place] [address] Phone: [phone] Fax: [phone]     Patient notified that their request is being sent to the clinical staff for review and that they should receive a response within 2 business days.   Please advise at Mobile [phone] (mobile)

## 2023-02-21 ENCOUNTER — Other Ambulatory Visit: Payer: Self-pay | Admitting: Adult Health

## 2023-02-21 DIAGNOSIS — F909 Attention-deficit hyperactivity disorder, unspecified type: Secondary | ICD-10-CM

## 2023-02-21 MED ORDER — AMPHETAMINE-DEXTROAMPHETAMINE 20 MG PO TABS: 20.0000 mg | ORAL_TABLET | Freq: Two times a day (BID) | ORAL | 0 refills | Status: DC

## 2023-02-21 NOTE — Telephone Encounter (Signed)
Patient notified of update  and verbalized understanding. 

## 2023-02-21 NOTE — Telephone Encounter (Signed)
Pt called, stating she never got her refill and wanted to know why?  Pt was reminded that NP is OOO on Mondays. Pt asked if NP could please send refill today?

## 2023-02-27 ENCOUNTER — Other Ambulatory Visit: Payer: Self-pay | Admitting: Adult Health

## 2023-02-27 DIAGNOSIS — Z1231 Encounter for screening mammogram for malignant neoplasm of breast: Secondary | ICD-10-CM

## 2023-03-07 ENCOUNTER — Encounter: Payer: Self-pay | Admitting: Adult Health

## 2023-03-07 ENCOUNTER — Ambulatory Visit (INDEPENDENT_AMBULATORY_CARE_PROVIDER_SITE_OTHER): Payer: PPO | Admitting: Adult Health

## 2023-03-07 VITALS — BP 120/80 | HR 78 | Temp 97.9°F | Ht 59.5 in | Wt 106.0 lb

## 2023-03-07 DIAGNOSIS — F909 Attention-deficit hyperactivity disorder, unspecified type: Secondary | ICD-10-CM

## 2023-03-07 DIAGNOSIS — I10 Essential (primary) hypertension: Secondary | ICD-10-CM

## 2023-03-07 DIAGNOSIS — E119 Type 2 diabetes mellitus without complications: Secondary | ICD-10-CM

## 2023-03-07 LAB — POCT GLYCOSYLATED HEMOGLOBIN (HGB A1C): Hemoglobin A1C: 5.8 % — AB (ref 4.0–5.6)

## 2023-03-07 NOTE — Patient Instructions (Signed)
It was great seeing you today   Your A1c was 5.8 - I am fine with you stopping Metformin to see if it makes a difference   Lets follow up in 3 months for your annual exam - after November 14th

## 2023-03-07 NOTE — Progress Notes (Signed)
Subjective:    Patient ID: Gabrielle Vargas, female    DOB: [birth year], 72 y.o.   MRN: [id number]  HPI 72 year old female who  has a past medical history of ADHD (attention deficit hyperactivity disorder) (05/09/2007), Diverticulitis of colon (without mention of hemorrhage) (09/17/2013), Essential hypertension (10/09/2017), History of COVID-19 (2021), Hyperlipidemia, Hypothyroidism, Low back pain radiating to right leg (10/09/2017), Mild cognitive impairment of uncertain or unknown etiology (08/04/2022), Mild intermittent asthma with acute exacerbation (05/30/2017), Osteoarthritis, Postmenopausal atrophic vaginitis, severe (08/18/2008), Right knee pain (03/13/2014), S/P arthroscopy of right knee (06/03/2014), and Vitamin B12 deficiency.  She presents to the office today for follow up regarding prediabetes and HTN.   DM - She is currently prescribed Metformin 1000 mg BID. She has been staying active and eating healthy. She feels as though her memory is affected by Metformin. She has been worked up for cognitive impairment and was diagnosed with mild neurocognitive dysfunction.  Lab Results  Component Value Date   HGBA1C 5.6 09/06/2022    HTN - managed with HCTZ 25 mg daily. She denies dizziness, lightheadedness and blurred vision.  BP Readings from Last 3 Encounters:  03/07/23 120/80  12/23/22 120/62  09/06/22 120/88    ADHD - managed with Adderral 20 mg BID - she is well-controlled on this dose.  Denies side effects such as anorexia, insomnia, or palpitations Wt Readings from Last 3 Encounters:  03/07/23 106 lb (48.1 kg)  12/23/22 106 lb (48.1 kg)  09/06/22 109 lb (49.4 kg)    Review of Systems See HPI   Past Medical History:  Diagnosis Date   ADHD (attention deficit hyperactivity disorder) 05/09/2007   Diverticulitis of colon (without mention of hemorrhage) 09/17/2013   Essential hypertension 10/09/2017   History of COVID-19 2021   Hyperlipidemia    Hypothyroidism     Low back pain radiating to right leg 10/09/2017   Mild cognitive impairment of uncertain or unknown etiology 08/04/2022   Mild intermittent asthma with acute exacerbation 05/30/2017   Osteoarthritis    Postmenopausal atrophic vaginitis, severe 08/18/2008   Right knee pain 03/13/2014   S/P arthroscopy of right knee 06/03/2014   Vitamin B12 deficiency     Social History   Socioeconomic History   Marital status: Married    Spouse name: Not on file   Number of children: 2   Years of education: 13   Highest education level: Some college, no degree  Occupational History   Occupation: Retired  Tobacco Use   Smoking status: Never   Smokeless tobacco: Never  Vaping Use   Vaping status: Never Used  Substance and Sexual Activity   Alcohol use: No   Drug use: No   Sexual activity: Not on file  Other Topics Concern   Not on file  Social History Narrative   Retired   Married   2 children   Right handed   Live with husband   Social Determinants of Health   Financial Resource Strain: Low Risk  (12/23/2022)   Overall Financial Resource Strain (CARDIA)    Difficulty of Paying Living Expenses: Not hard at all  Food Insecurity: No Food Insecurity (12/23/2022)   Hunger Vital Sign    Worried About Running Out of Food in the Last Year: Never true    Ran Out of Food in the Last Year: Never true  Transportation Needs: No Transportation Needs (12/23/2022)   PRAPARE - Transportation    Lack of Transportation (Medical): No    Lack of  Transportation (Non-Medical): No  Physical Activity: Sufficiently Active (12/23/2022)   Exercise Vital Sign    Days of Exercise per Week: 7 days    Minutes of Exercise per Session: 60 min  Stress: No Stress Concern Present (12/23/2022)   [employer] of Occupational Health - Occupational Stress Questionnaire    Feeling of Stress : Not at all  Social Connections: Socially Integrated (12/23/2022)   Social Connection and Isolation Panel [NHANES]    Frequency of  Communication with Friends and Family: More than three times a week    Frequency of Social Gatherings with Friends and Family: More than three times a week    Attends Religious Services: More than 4 times per year    Active Member of [employer] or Organizations: Yes    Attends Engineer, structural: More than 4 times per year    Marital Status: Married  Catering manager Violence: Not At Risk (12/23/2022)   Humiliation, Afraid, Rape, and Kick questionnaire    Fear of Current or Ex-Partner: No    Emotionally Abused: No    Physically Abused: No    Sexually Abused: No    Past Surgical History:  Procedure Laterality Date   ABDOMINAL HYSTERECTOMY     BREAST SURGERY     CARPAL TUNNEL RELEASE Bilateral    CERVICAL DISC ARTHROPLASTY     CESAREAN SECTION     x2   left shoulder surgery     MENISCUS REPAIR Right 2017   REDUCTION MAMMAPLASTY Bilateral    right shoulder     TONSILLECTOMY      Family History  Problem Relation Age of Onset   Melanoma Mother    Kidney disease Father    Diabetes Father    Heart disease Father    Breast cancer Paternal Grandmother    ADD / ADHD Other        multiple family members   Colon cancer Neg Hx    Esophageal cancer Neg Hx    Rectal cancer Neg Hx    Stomach cancer Neg Hx     Allergies  Allergen Reactions   Latex     REACTION: rash/hives   Oxycodone-Acetaminophen     REACTION: nausea    Current Outpatient Medications on File Prior to Visit  Medication Sig Dispense Refill   Acetylcysteine (NAC) 500 MG CAPS Take by mouth.     amphetamine-dextroamphetamine (ADDERALL) 20 MG tablet Take 1 tablet (20 mg total) by mouth 2 (two) times daily. 60 tablet 0   amphetamine-dextroamphetamine (ADDERALL) 20 MG tablet Take 1 tablet (20 mg total) by mouth 2 (two) times daily. 60 tablet 0   amphetamine-dextroamphetamine (ADDERALL) 20 MG tablet Take 1 tablet (20 mg total) by mouth 2 (two) times daily. 60 tablet 0   Ascorbic Acid (VITAMIN C) 1000 MG tablet  Take 1,000 mg by mouth daily.     Cholecalciferol (D3-50 PO) Take by mouth.     Evening Primrose Oil 1000 MG CAPS Take 1,300 mg by mouth in the morning and at bedtime.     ezetimibe (ZETIA) 10 MG tablet TAKE 1 TABLET BY MOUTH DAILY 90 tablet 3   hydrochlorothiazide (HYDRODIURIL) 25 MG tablet TAKE 1 TABLET BY MOUTH DAILY 90 tablet 1   ibuprofen (ADVIL,MOTRIN) 200 MG tablet Take 200 mg by mouth every 6 (six) hours as needed.     metFORMIN (GLUCOPHAGE) 500 MG tablet TAKE 2 TABLETS(1000 MG) BY MOUTH TWICE DAILY WITH A MEAL 360 tablet 1   simvastatin (ZOCOR) 20  MG tablet TAKE 1 TABLET(20 MG) BY MOUTH EVERY OTHER DAY 45 tablet 3   SYNTHROID 75 MCG tablet TAKE 1 TABLET BY MOUTH DAILY 90 tablet 3   vitamin B-12 (CYANOCOBALAMIN) 1000 MCG tablet Take 1,000 mcg by mouth 2 (two) times a week.     Zinc 20 MG CAPS Take by mouth.     No current facility-administered medications on file prior to visit.    BP 120/80   Pulse 78   Temp 97.9 F (36.6 C) (Oral)   Ht 4' 11.5" (1.511 m)   Wt 106 lb (48.1 kg)   SpO2 97%   BMI 21.05 kg/m       Objective:   Physical Exam Vitals and nursing note reviewed.  Constitutional:      Appearance: Normal appearance.  Cardiovascular:     Rate and Rhythm: Normal rate and regular rhythm.     Pulses: Normal pulses.     Heart sounds: Normal heart sounds.  Pulmonary:     Effort: Pulmonary effort is normal.     Breath sounds: Normal breath sounds.  Musculoskeletal:        General: Normal range of motion.  Skin:    General: Skin is warm and dry.  Neurological:     General: No focal deficit present.     Mental Status: She is alert and oriented to person, place, and time.  Psychiatric:        Mood and Affect: Mood normal.        Behavior: Behavior normal.        Thought Content: Thought content normal.        Judgment: Judgment normal.       Assessment & Plan:  1. Attention deficit hyperactivity disorder (ADHD), unspecified ADHD type - Continue with  Adderall 20 mg BID   2. Diabetes mellitus without complication (HCC)  - POC HgB A1c- 5.8  - I am fine with her stopping Metformin for three months  - Will recheck at her CPE in November   3. Essential hypertension - Well controlled. No change in medication   Shirline Frees, NP

## 2023-04-11 ENCOUNTER — Ambulatory Visit
Admission: RE | Admit: 2023-04-11 | Discharge: 2023-04-11 | Disposition: A | Discharge status: Home / Self Care | Payer: PPO | Source: Ambulatory Visit | Attending: Adult Health | Admitting: Adult Health

## 2023-04-11 DIAGNOSIS — Z1231 Encounter for screening mammogram for malignant neoplasm of breast: Secondary | ICD-10-CM | POA: Diagnosis not present

## 2023-05-01 DIAGNOSIS — L905 Scar conditions and fibrosis of skin: Secondary | ICD-10-CM | POA: Diagnosis not present

## 2023-05-01 DIAGNOSIS — D225 Melanocytic nevi of trunk: Secondary | ICD-10-CM | POA: Diagnosis not present

## 2023-05-01 DIAGNOSIS — L814 Other melanin hyperpigmentation: Secondary | ICD-10-CM | POA: Diagnosis not present

## 2023-06-16 ENCOUNTER — Other Ambulatory Visit: Payer: Self-pay | Admitting: Adult Health

## 2023-07-18 ENCOUNTER — Encounter: Payer: PPO | Admitting: Adult Health

## 2023-08-08 DIAGNOSIS — E119 Type 2 diabetes mellitus without complications: Secondary | ICD-10-CM | POA: Diagnosis not present

## 2023-08-08 LAB — HM DIABETES EYE EXAM

## 2023-08-23 ENCOUNTER — Encounter: Payer: Self-pay | Admitting: Physician Assistant

## 2023-08-23 ENCOUNTER — Ambulatory Visit: Payer: PPO | Admitting: Physician Assistant

## 2023-08-23 VITALS — BP 162/91 | HR 99 | Resp 18 | Ht 59.5 in

## 2023-08-23 DIAGNOSIS — G3184 Mild cognitive impairment, so stated: Secondary | ICD-10-CM

## 2023-08-23 DIAGNOSIS — R413 Other amnesia: Secondary | ICD-10-CM

## 2023-08-23 NOTE — Progress Notes (Signed)
 Assessment/Plan:   Memory Impairment   Gabrielle Vargas is a very pleasant 73 y.o. RH female with a history of ADHD, arthritis,  seasonal asthma, hypertension, hyperlipidemia, hypothyroidism, B12 deficiency, diabetes and a diagnosis of mild cognitive impairment per neuropsych evaluation January 2024, not suggestive of Alzheimer's disease or LBD or another more rare neurodegenerative condition.  Etiology is currently unclear.  She is not on antidementia medications at this time.  MMSE today is 30/30.  Memory remains stable.  Patient is undergoing significant amount of stress due to family issues, we discussed the role of psychotherapy to help her with any psychiatric distress and in turn, may help her memory.     Recommendations:   Follow up in 1year No antidementia medications are indicated at this time Repeat neuropsych evaluation in 6 to 12 months for diagnostic clarity Recommend psychotherapy for situational stress Recommend following up on her ADHD, she is on Adderall Recommend good control of cardiovascular risk factors.  Patient has been informed of abnormal be elevated blood pressure today. Continue to control mood as per PCP    Subjective:   This patient is accompanied in the office by her husband who supplements the history. Previous records as well as any outside records available were reviewed prior to todays visit.  Patient was last seen on 08/22/2022    Any changes in memory since last visit? About the same.  She continues to have some difficulty with recent conversations and names, new information. Husband is concerned that at times she does not remember her neighbors name. I just need a little time to retrieve some words-says. She may forget in mid sentence the flow of conversations-husband says  repeats oneself?  Endorsed, mostly with questions. His voice is changed so I don't understand hat he is saying. Disoriented when walking into a room?  Patient denies     Misplacing objects?  Patient denies   Wandering behavior?   Denies.   Any personality changes since last visit?  There is some stress with her daughter which affects her emotionally.  Daughter sends multiple texts and we do not know how to respond to her, she accuses her of being  emotionally manipulative -husband says.  Any worsening depression?: Endorsed, due to above.  Hallucinations or paranoia?  Denies.   Seizures?   Denies.    Any sleep changes? Does not sleep well due to stress. Denies vivid dreams, REM behavior or sleepwalking   Sleep apnea?   denies    Any hygiene concerns?   denies   Independent of bathing and dressing?  Endorsed  Does the patient needs help with medications? Patient is in charge   Who is in charge of the finances?  Husband has always been in charge     Any changes in appetite?  Denies.     Patient have trouble swallowing?  Denies.   Does the patient cook?  Yes, they cook together. Any kitchen accidents such as leaving the stove on?   Denies.   Any headaches?   She has a history of ocular migraines with aura (self diagnosed) lasting 15-30 minutes, not worsening, not frequent. Vision changes? denies Chronic pain?  denies   Ambulates with difficulty?  She walks 4 to 6 miles a day with her husband, enjoys playing golf and fishing with her.    Recent falls or head injuries?    denies      Unilateral weakness, numbness or tingling?   denies   Any tremors?  denies   Any anosmia?    Denies.   Any incontinence of urine?  She has a history of urge incontinence Any bowel dysfunction?  Denies.      Patient lives with husband and daughter      Does the patient drive?  Yes, she reports being cautious, she likes to drive with her husband by her side .  Initial evaluation 06/19/2022   How long did patient have memory difficulties?  For the last 2 years, the patient experienced some episodes of disorientation, initially attributed to COVID, as she had it 3 times, the  symptoms appear to have been worse during those times.  On 05/19/2022, 2 weeks after COVID, her husband took her to the grocery store, and she stood at the sandwich counter trying to decide what she should order, despite 15 or 20 minutes have been passed by.  She also reported that she could not completely breathe, since she had any blurred vision.  Her husband then sent her to another area of the store to shop for serial, and again, she could not make a decision, she cannot find what she came to look for.  In another event, at home, she was unable to remember where she kept items in the kitchen.  On Nov 11,  she was having a yard sale at her home, holding a plate and asked her daughter what should I do with this  There were no episodes of slurred speech or facial droop or unilateral weakness with these episodes, she was just having word finding difficulty.  The patient reported at the time being disoriented, having a mild headache, and dizzy with blurry vision, for which she was sent to the ED.  CT scan of the head then was negative for acute abnormalities.  UA was negative.  On November 21, she was texting with her sister, but eventually she decided to talk with her on the phone, and felt that it was not making any sense.  The patient was aware of what was happening.  That last.  Lasted for about 1 hour.  In the interim, she has difficulty thinking of the word, or naming a kitchen tool, names of people and to a certain extent conversations that took place 2 to 3 days prior.   repeats oneself?  Endorsed her husband states that when he turned day prior load with, she asked what is that smell , 3 times (that was in November 21)  Ambulates  with difficulty?  Until recently, she and her husband walk 4-6 miles till this happened, he also enjoyed playing golf and fishing  Recent falls?  Patient denies   Any head injuries?  Patient denies   History of seizures?   Patient denies   Wandering behavior?  Patient  denies   Patient drives?   Husband does not let her since the 11th  Any personality changes?  Patient recognizes that for the last 2 months, she has made little comments to her husband, which are not unusual for her  Any history of depression?:  Patient denies   Hallucinations or paranoia?  Denies hallucinations, but lately she has been felt on the edge as if something is going to happen Patient reports that sleeps well with occasional vivid dreams, but without REM behavior or sleepwalking    History of sleep apnea?  Patient denies   Any hygiene concerns?  Patient denies   Independent of bathing and dressing?  Endorsed  Does the patient needs help with  medications? Patient is in charge  Who is in charge of the finances?  Husband is in charge  (always)  Any changes in appetite?  s A little less, I don't eat as much as I used to    Patient have trouble swallowing? Patient denies   Does the patient cook? Any kitchen accidents such as leaving the stove on? We love to cook at home  Patient denies nay kitchen accidents.  Any headaches?  Patient denies I have a history of ocular migraines, p 15 -30 mins gets an aura , but not actually diagnosed.  Her husband reports that she has just looked at her symptoms on the Internet and self diagnosed Double vision? Patient denies  I have been sent to ophthalmologist, but records are not available for review  any focal numbness or tingling?  Patient denies   Chronic back pain Patient denies   Unilateral weakness?  Patient denies   Any tremors?  Patient denies   Any history of anosmia?  Patient denies   Any incontinence of urine?  Urge incontinence  Any bowel dysfunction?   Patient denies    History of heavy alcohol intake?  Patient denies   History of heavy tobacco use?  Patient denies   Family history of dementia?    GGM Alzheimer's disease   Patient lives:  Husband and daughter   Pertinent Labs B12 455, D34.12, CBC normal, A1c 6.4, total  cholesterol 201, triglycerides 164, HDL normal LDL 107.   Neuropsychological evaluation 08/11/2022 Briefly, results suggested impairment across several aspects of expressive language, namely verbal fluency (semantic worse than phonemic) and confrontation naming. Performance variability was exhibited across executive functioning, while an additional weakness was noted across inattention and vigilance aspects of a continuous performance task. The etiology for ongoing dysfunction is unclear at the present time. Neurologically speaking, recent neuroimaging revealed minimal microvascular ischemic changes, not suggestive of any prior infarcts or reason to suspect an underlying vascular etiology. A recent 1-hour EEG did not reveal epileptiform discharges during active monitoring. Performances across memory testing were not suggestive of Alzheimer's disease. Behavioral characteristics were not suggestive of Lewy body disease, another more rare parkinsonian condition, or frontotemporal lobar degeneration. Typical conversation was also not strongly suggestive of a non-fluent primary progressive aphasia or semantic dementia presentation. While expressive language dysfunction could raise concern for a logopenic aphasia presentation, overall cognitive patterns were not wholly consistent with this illness presently. Ms. Peppel does have a longstanding history of distractibility and trouble with sustained focus. Performance across a continuous performance task did align with her previous diagnosis of ADHD. There remains the potential for attentional lapses to be a direct cause for word finding difficulties and instances where she may lose her train of thought or become disoriented in her day-to-day life. While a contribution from her history of several COVID-19 infections is plausible, there has yet to be a sufficient number of high quality longitudinal studies to truly understand the likelihood and extent of persisting  cognitive dysfunction stemming from prior COVID-19 infection. Stating a direct link between expressive language dysfunction and COVID-19 experience with any degree of certainly would appear premature at the present time. Continued medical monitoring will be important moving forward.   Past Medical History:  Diagnosis Date   ADHD (attention deficit hyperactivity disorder) 05/09/2007   Diverticulitis of colon (without mention of hemorrhage) 09/17/2013   Essential hypertension 10/09/2017   History of COVID-19 2021   Hyperlipidemia    Hypothyroidism    Low back pain  radiating to right leg 10/09/2017   Mild cognitive impairment of uncertain or unknown etiology 08/04/2022   Mild intermittent asthma with acute exacerbation 05/30/2017   Osteoarthritis    Postmenopausal atrophic vaginitis, severe 08/18/2008   Right knee pain 03/13/2014   S/P arthroscopy of right knee 06/03/2014   Vitamin B12 deficiency      Past Surgical History:  Procedure Laterality Date   ABDOMINAL HYSTERECTOMY     BREAST SURGERY     CARPAL TUNNEL RELEASE Bilateral    CERVICAL DISC ARTHROPLASTY     CESAREAN SECTION     x2   left shoulder surgery     MENISCUS REPAIR Right 2017   REDUCTION MAMMAPLASTY Bilateral    right shoulder     TONSILLECTOMY       PREVIOUS MEDICATIONS:   CURRENT MEDICATIONS:  Outpatient Encounter Medications as of 08/23/2023  Medication Sig   Acetylcysteine (NAC) 500 MG CAPS Take by mouth.   amphetamine -dextroamphetamine  (ADDERALL) 20 MG tablet Take 1 tablet (20 mg total) by mouth 2 (two) times daily.   amphetamine -dextroamphetamine  (ADDERALL) 20 MG tablet Take 1 tablet (20 mg total) by mouth 2 (two) times daily.   amphetamine -dextroamphetamine  (ADDERALL) 20 MG tablet Take 1 tablet (20 mg total) by mouth 2 (two) times daily. (Patient taking differently: Take 20 mg by mouth daily.)   Ascorbic Acid (VITAMIN C) 1000 MG tablet Take 1,000 mg by mouth daily.   Cholecalciferol (D3-50 PO) Take by  mouth.   Evening Primrose Oil 1000 MG CAPS Take 1,300 mg by mouth in the morning and at bedtime.   ezetimibe  (ZETIA ) 10 MG tablet TAKE 1 TABLET BY MOUTH DAILY   hydrochlorothiazide  (HYDRODIURIL ) 25 MG tablet TAKE 1 TABLET BY MOUTH DAILY   ibuprofen (ADVIL,MOTRIN) 200 MG tablet Take 200 mg by mouth every 6 (six) hours as needed.   metFORMIN  (GLUCOPHAGE ) 500 MG tablet TAKE 2 TABLETS(1000 MG) BY MOUTH TWICE DAILY WITH A MEAL   simvastatin  (ZOCOR ) 20 MG tablet TAKE 1 TABLET(20 MG) BY MOUTH EVERY OTHER DAY   SYNTHROID  75 MCG tablet TAKE 1 TABLET BY MOUTH DAILY   vitamin B-12 (CYANOCOBALAMIN ) 1000 MCG tablet Take 1,000 mcg by mouth 2 (two) times a week.   Zinc 20 MG CAPS Take by mouth.   No facility-administered encounter medications on file as of 08/23/2023.     Objective:     PHYSICAL EXAMINATION:    VITALS:   Vitals:   08/23/23 0852  BP: (!) 162/91  Pulse: 99  Resp: 18  SpO2: 95%  Height: 4' 11.5 (1.511 m)    GEN:  The patient appears stated age and is in NAD. HEENT:  Normocephalic, atraumatic.   Neurological examination:  General: NAD, well-groomed, appears stated age. Orientation: The patient is alert. Oriented to person, place and date Cranial nerves: There is good facial symmetry.The speech is fluent and clear. No aphasia or dysarthria. Fund of knowledge is appropriate. Recent memory impaired and remote memory is normal.  Attention and concentration are normal.  Able to name objects and repeat phrases.  Hearing is intact to conversational tone .   Delayed recall 3/3 Sensation: Sensation is intact to light touch throughout Motor: Strength is at least antigravity x4. DTR's 2/4 in UE/LE      06/20/2022   12:00 PM  Montreal Cognitive Assessment   Visuospatial/ Executive (0/5) 5  Naming (0/3) 3  Attention: Read list of digits (0/2) 2  Attention: Read list of letters (0/1) 1  Attention: Serial 7 subtraction  starting at 100 (0/3) 1  Language: Repeat phrase (0/2) 2   Language : Fluency (0/1) 0  Abstraction (0/2) 0  Delayed Recall (0/5) 3  Orientation (0/6) 6  Total 23  Adjusted Score (based on education) 23       08/23/2023   12:00 PM 06/02/2022   11:04 AM  MMSE - Mini Mental State Exam  Orientation to time 5 5  Orientation to Place 5 5  Registration 3 3  Attention/ Calculation 5 5  Recall 3 3  Language- name 2 objects 2 2  Language- repeat 1 1  Language- follow 3 step command 3 3  Language- read & follow direction 1 1  Write a sentence 1 1  Copy design 1 1  Total score 30 30       Movement examination: Tone: There is normal tone in the UE/LE Abnormal movements:  no tremor.  No myoclonus.  No asterixis.   Coordination:  There is no decremation with RAM's. Normal finger to nose  Gait and Station: The patient has no difficulty arising out of a deep-seated chair without the use of the hands. The patient's stride length is good.  Gait is cautious and narrow.   Thank you for allowing us  the opportunity to participate in the care of this nice patient. Please do not hesitate to contact us  for any questions or concerns.   Total time spent on today's visit was dedicated to this patient today, preparing to see patient, examining the patient, ordering tests and/or medications and counseling the patient, documenting clinical information in the EHR or other health record, independently interpreting results and communicating results to the patient/family, discussing treatment and goals, answering patient's questions and coordinating care.  Cc:  Merna Huxley, NP  Camie Sevin 08/23/2023 12:02 PM

## 2023-08-23 NOTE — Patient Instructions (Signed)
 It was a pleasure to see you today at our office.   Recommendations:  Follow up in 1 year  Control that blood pressure  Monitor ADHD Recommend psychotherapy Repeat neuropsych evaluation   Whom to call:  Memory  decline, memory medications: Call our office [phone]   For psychiatric meds, mood meds: Please have your primary care physician manage these medications.        RECOMMENDATIONS FOR ALL PATIENTS WITH MEMORY PROBLEMS: 1. Continue to exercise (Recommend 30 minutes of walking everyday, or 3 hours every week) 2. Increase social interactions - continue going to [place] and enjoy social gatherings with friends and family 3. Eat healthy, avoid fried foods and eat more fruits and vegetables 4. Maintain adequate blood pressure, blood sugar, and blood cholesterol level. Reducing the risk of stroke and cardiovascular disease also helps promoting better memory. 5. Avoid stressful situations. Live a simple life and avoid aggravations. Organize your time and prepare for the next day in anticipation. 6. Sleep well, avoid any interruptions of sleep and avoid any distractions in the bedroom that may interfere with adequate sleep quality 7. Avoid sugar, avoid sweets as there is a strong link between excessive sugar intake, diabetes, and cognitive impairment We discussed the Mediterranean diet, which has been shown to help patients reduce the risk of progressive memory disorders and reduces cardiovascular risk. This includes eating fish, eat fruits and green leafy vegetables, nuts like almonds and hazelnuts, walnuts, and also use olive oil. Avoid fast foods and fried foods as much as possible. Avoid sweets and sugar as sugar use has been linked to worsening of memory function.  There is always a concern of gradual progression of memory problems. If this is the case, then we may need to adjust level of care according to patient needs. Support, both to the patient and caregiver, should then be put  into place.    FALL PRECAUTIONS: Be cautious when walking. Scan the area for obstacles that may increase the risk of trips and falls. When getting up in the mornings, sit up at the edge of the bed for a few minutes before getting out of bed. Consider elevating the bed at the head end to avoid drop of blood pressure when getting up. Walk always in a well-lit room (use night lights in the walls). Avoid area rugs or power cords from appliances in the middle of the walkways. Use a walker or a cane if necessary and consider physical therapy for balance exercise. Get your eyesight checked regularly.  FINANCIAL OVERSIGHT: Supervision, especially oversight when making financial decisions or transactions is also recommended.  HOME SAFETY: Consider the safety of the kitchen when operating appliances like stoves, microwave oven, and blender. Consider having supervision and share cooking responsibilities until no longer able to participate in those. Accidents with firearms and other hazards in the house should be identified and addressed as well.   ABILITY TO BE LEFT ALONE: If patient is unable to contact 911 operator, consider using LifeLine, or when the need is there, arrange for someone to stay with patients. Smoking is a fire hazard, consider supervision or cessation. Risk of wandering should be assessed by caregiver and if detected at any point, supervision and safe proof recommendations should be instituted.  MEDICATION SUPERVISION: Inability to self-administer medication needs to be constantly addressed. Implement a mechanism to ensure safe administration of the medications.   DRIVING: Regarding driving, in patients with progressive memory problems, driving will be impaired. We advise to have someone else  do the driving if trouble finding directions or if minor accidents are reported. Independent driving assessment is available to determine safety of driving.   If you are interested in the driving  assessment, you can contact the following:  [facility] in [place] [phone]  [facility] [phone]  [facility] [phone]  [facility] [phone] or [phone]

## 2023-08-28 ENCOUNTER — Telehealth: Payer: Self-pay | Admitting: Adult Health

## 2023-08-28 NOTE — Telephone Encounter (Signed)
 Patient came in requesting refill of:  amphetamine -dextroamphetamine  (ADDERALL) 20 MG tablet   [address] [id number] - [place], [place] - [address] Phone: [phone]  Fax: [phone]     Please advise at [phone]

## 2023-08-29 ENCOUNTER — Other Ambulatory Visit: Payer: Self-pay | Admitting: Adult Health

## 2023-08-29 DIAGNOSIS — F909 Attention-deficit hyperactivity disorder, unspecified type: Secondary | ICD-10-CM

## 2023-08-29 MED ORDER — AMPHETAMINE-DEXTROAMPHETAMINE 20 MG PO TABS
20.0000 mg | ORAL_TABLET | Freq: Two times a day (BID) | ORAL | 0 refills | Status: DC
Start: 2023-08-29 — End: 2023-10-20

## 2023-08-29 MED ORDER — AMPHETAMINE-DEXTROAMPHETAMINE 20 MG PO TABS: 20.0000 mg | ORAL_TABLET | Freq: Two times a day (BID) | ORAL | 0 refills | Status: DC

## 2023-08-29 NOTE — Telephone Encounter (Signed)
Okay for refill?

## 2023-09-05 ENCOUNTER — Encounter: Payer: Self-pay | Admitting: Adult Health

## 2023-09-05 ENCOUNTER — Ambulatory Visit (INDEPENDENT_AMBULATORY_CARE_PROVIDER_SITE_OTHER): Payer: PPO | Admitting: Adult Health

## 2023-09-05 VITALS — BP 130/80 | HR 72 | Temp 97.6°F | Ht 59.0 in | Wt 111.0 lb

## 2023-09-05 DIAGNOSIS — I10 Essential (primary) hypertension: Secondary | ICD-10-CM | POA: Diagnosis not present

## 2023-09-05 DIAGNOSIS — Z0000 Encounter for general adult medical examination without abnormal findings: Secondary | ICD-10-CM | POA: Diagnosis not present

## 2023-09-05 DIAGNOSIS — R7303 Prediabetes: Secondary | ICD-10-CM | POA: Diagnosis not present

## 2023-09-05 DIAGNOSIS — E782 Mixed hyperlipidemia: Secondary | ICD-10-CM | POA: Diagnosis not present

## 2023-09-05 DIAGNOSIS — R410 Disorientation, unspecified: Secondary | ICD-10-CM

## 2023-09-05 DIAGNOSIS — E039 Hypothyroidism, unspecified: Secondary | ICD-10-CM

## 2023-09-05 LAB — COMPREHENSIVE METABOLIC PANEL
ALT: 22 U/L (ref 0–35)
AST: 21 U/L (ref 0–37)
Albumin: 4.6 g/dL (ref 3.5–5.2)
Alkaline Phosphatase: 112 U/L (ref 39–117)
BUN: 16 mg/dL (ref 6–23)
CO2: 32 meq/L (ref 19–32)
Calcium: 9.6 mg/dL (ref 8.4–10.5)
Chloride: 95 meq/L — ABNORMAL LOW (ref 96–112)
Creatinine, Ser: 0.7 mg/dL (ref 0.40–1.20)
GFR: 84.9 mL/min (ref >60.00)
Glucose, Bld: 119 mg/dL — ABNORMAL HIGH (ref 70–99)
Potassium: 3.2 meq/L — ABNORMAL LOW (ref 3.5–5.1)
Sodium: 139 meq/L (ref 135–145)
Total Bilirubin: 0.4 mg/dL (ref 0.2–1.2)
Total Protein: 7.2 g/dL (ref 6.0–8.3)

## 2023-09-05 LAB — CBC WITH DIFFERENTIAL/PLATELET
Basophils Absolute: 0 10*3/uL (ref 0.0–0.1)
Basophils Relative: 1 % (ref 0.0–3.0)
Eosinophils Absolute: 0 10*3/uL (ref 0.0–0.7)
Eosinophils Relative: 1 % (ref 0.0–5.0)
HCT: 40.6 % (ref 36.0–46.0)
Hemoglobin: 13.8 g/dL (ref 12.0–15.0)
Lymphocytes Relative: 32 % (ref 12.0–46.0)
Lymphs Abs: 1.3 10*3/uL (ref 0.7–4.0)
MCHC: 34 g/dL (ref 30.0–36.0)
MCV: 85.7 fL (ref 78.0–100.0)
Monocytes Absolute: 0.2 10*3/uL (ref 0.1–1.0)
Monocytes Relative: 6.1 % (ref 3.0–12.0)
Neutro Abs: 2.4 10*3/uL (ref 1.4–7.7)
Neutrophils Relative %: 59.9 % (ref 43.0–77.0)
Platelets: 243 10*3/uL (ref 150.0–400.0)
RBC: 4.74 Mil/uL (ref 3.87–5.11)
RDW: 12.9 % (ref 11.5–15.5)
WBC: 4 10*3/uL (ref 4.0–10.5)

## 2023-09-05 LAB — HEMOGLOBIN A1C: Hgb A1c MFr Bld: 6.2 % (ref 4.6–6.5)

## 2023-09-05 LAB — TSH: TSH: 2.39 u[IU]/mL (ref 0.35–5.50)

## 2023-09-05 LAB — LIPID PANEL
Cholesterol: 156 mg/dL (ref 0–200)
HDL: 58.5 mg/dL (ref >39.00)
LDL Cholesterol: 80 mg/dL (ref 0–99)
NonHDL: 97.17
Total CHOL/HDL Ratio: 3
Triglycerides: 86 mg/dL (ref 0.0–149.0)
VLDL: 17.2 mg/dL (ref 0.0–40.0)

## 2023-09-05 MED ORDER — SIMVASTATIN 20 MG PO TABS: ORAL_TABLET | ORAL | 3 refills | Status: DC

## 2023-09-05 NOTE — Patient Instructions (Addendum)
 It was great seeing you today   We will follow up with you regarding your lab work   Please let me know if you need anything

## 2023-09-05 NOTE — Progress Notes (Signed)
Subjective:    Patient ID: Gabrielle Vargas, female    DOB: [birth year], 73 y.o.   MRN: [id number]  HPI Patient presents for yearly preventative medicine examination. 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  Prediabetes  Not currently taking Metformin. We decided to hold this during the last visit and see how her A1c did.  She has been staying active and eating healthy. Lab Results  Component Value Date   HGBA1C 5.8 (A) 03/07/2023   HGBA1C 5.6 09/06/2022   HGBA1C 6.4 05/31/2022    HTN - managed with HCTZ 25 mg daily. She denies dizziness, lightheadedness and blurred vision.  BP Readings from Last 3 Encounters:  09/05/23 130/80  08/23/23 (!) 162/91  03/07/23 120/80   ADHD - managed with Adderral 20 mg BID - she is well-controlled on this dose.  Denies side effects such as anorexia, insomnia, or palpitations  Hyperlipidemia-managed with simvastatin 20 mg every other day and zetia 10 mg daily. .  She denies any side effects when she takes her medication this way.  Lab Results  Component Value Date   CHOL 152 05/31/2022   HDL 65.70 05/31/2022   LDLCALC 64 05/31/2022   LDLDIRECT 158.7 06/19/2014   TRIG 113.0 05/31/2022   CHOLHDL 2 05/31/2022     Hypothyroidism-currently managed with Synthroid 75 mcg daily.  She does feel well on this dose and no signs of hypo or  hyperthyroidism  Mild Cognitive Impairment -diagnosed by neuropsych in January 2024, not suggestive of Alzheimer's dementia or LBD.  Her etiology is unclear.  She is not on any antidementia medication at this time.  She was seen by neurology earlier this month with a MMSE of 30 out of 30.  He feels as though her memory is stable.  She is undergoing significant amount of stress due to family issues at home.She feels as though she has word finding difficulty from time to time   All immunizations and health maintenance protocols were reviewed with the patient and needed orders were placed.  Appropriate screening laboratory values were ordered for the patient including screening of hyperlipidemia, renal function and hepatic function.   Medication reconciliation,  past medical history, social history, problem list and allergies were reviewed in detail with the patient  Goals were established with regard to weight loss, exercise, and  diet in compliance with medications  She will be due for routine colon cancer screening in March 2025, she is on a recall list.  She is up-to-date on routine mammograms.   Review of Systems  Constitutional: Negative.   HENT: Negative.    Eyes: Negative.   Respiratory: Negative.    Cardiovascular: Negative.   Gastrointestinal: Negative.   Endocrine: Negative.   Genitourinary: Negative.   Musculoskeletal:  Positive for arthralgias.  Skin: Negative.   Allergic/Immunologic: Negative.   Neurological: Negative.   Hematological: Negative.   Psychiatric/Behavioral:  Positive for confusion.    Past Medical History:  Diagnosis Date   ADHD (attention deficit hyperactivity disorder) 05/09/2007   Diverticulitis of colon (without mention of hemorrhage)  09/17/2013   Essential hypertension 10/09/2017   History of COVID-19 2021   Hyperlipidemia    Hypothyroidism    Low back pain radiating to right leg 10/09/2017   Mild cognitive impairment of uncertain or unknown  etiology 08/04/2022   Mild intermittent asthma with acute exacerbation 05/30/2017   Osteoarthritis    Postmenopausal atrophic vaginitis, severe 08/18/2008   Right knee pain 03/13/2014   S/P arthroscopy of right knee 06/03/2014   Vitamin B12 deficiency     Social History   Socioeconomic History   Marital status: Married    Spouse name: Not on file   Number of children: 2   Years of education: 13   Highest education level: Some college, no degree  Occupational History   Occupation: Retired  Tobacco Use   Smoking status: Never   Smokeless tobacco: Never  Vaping Use   Vaping status: Never Used  Substance and Sexual Activity   Alcohol use: No   Drug use: No   Sexual activity: Not on file  Other Topics Concern   Not on file  Social History Narrative   Retired   Married   2 children   Right handed   Live with husband   Social Drivers of Corporate investment banker Strain: Low Risk  (12/23/2022)   Overall Financial Resource Strain (CARDIA)    Difficulty of Paying Living Expenses: Not hard at all  Food Insecurity: No Food Insecurity (12/23/2022)   Hunger Vital Sign    Worried About Running Out of Food in the Last Year: Never true    Ran Out of Food in the Last Year: Never true  Transportation Needs: No Transportation Needs (12/23/2022)   PRAPARE - Administrator, Civil Service (Medical): No    Lack of Transportation (Non-Medical): No  Physical Activity: Sufficiently Active (12/23/2022)   Exercise Vital Sign    Days of Exercise per Week: 7 days    Minutes of Exercise per Session: 60 min  Stress: No Stress Concern Present (12/23/2022)   [employer] of Occupational Health - Occupational Stress Questionnaire    Feeling of Stress : Not at all  Social Connections: Socially Integrated (12/23/2022)   Social Connection and Isolation Panel [NHANES]    Frequency of Communication with Friends and Family: More than three times a week    Frequency of Social Gatherings with  Friends and Family: More than three times a week    Attends Religious Services: More than 4 times per year    Active Member of [employer] or Organizations: Yes    Attends Engineer, structural: More than 4 times per year    Marital Status: Married  Catering manager Violence: Not At Risk (12/23/2022)   Humiliation, Afraid, Rape, and Kick questionnaire    Fear of Current or Ex-Partner: No    Emotionally Abused: No    Physically Abused: No    Sexually Abused: No    Past Surgical History:  Procedure Laterality Date   ABDOMINAL HYSTERECTOMY     BREAST SURGERY     CARPAL TUNNEL RELEASE Bilateral    CERVICAL DISC ARTHROPLASTY     CESAREAN SECTION     x2   left shoulder surgery     MENISCUS REPAIR Right 2017   REDUCTION MAMMAPLASTY Bilateral    right shoulder     TONSILLECTOMY      Family History  Problem Relation Age of Onset   Melanoma Mother    Kidney disease Father  Diabetes Father    Heart disease Father    Breast cancer Paternal Grandmother    ADD / ADHD Other        multiple family members   Colon cancer Neg Hx    Esophageal cancer Neg Hx    Rectal cancer Neg Hx    Stomach cancer Neg Hx     Allergies  Allergen Reactions   Latex     REACTION: rash/hives   Oxycodone-Acetaminophen     REACTION: nausea    Current Outpatient Medications on File Prior to Visit  Medication Sig Dispense Refill   Acetylcysteine (NAC) 500 MG CAPS Take by mouth.     amphetamine-dextroamphetamine (ADDERALL) 20 MG tablet Take 1 tablet (20 mg total) by mouth 2 (two) times daily. 60 tablet 0   amphetamine-dextroamphetamine (ADDERALL) 20 MG tablet Take 1 tablet (20 mg total) by mouth 2 (two) times daily. 60 tablet 0   amphetamine-dextroamphetamine (ADDERALL) 20 MG tablet Take 1 tablet (20 mg total) by mouth 2 (two) times daily. 60 tablet 0   Ascorbic Acid (VITAMIN C) 1000 MG tablet Take 1,000 mg by mouth daily.     Cholecalciferol (D3-50 PO) Take by mouth.     Evening Primrose Oil 1000 MG  CAPS Take 1,300 mg by mouth in the morning and at bedtime.     ezetimibe (ZETIA) 10 MG tablet TAKE 1 TABLET BY MOUTH DAILY 90 tablet 3   hydrochlorothiazide (HYDRODIURIL) 25 MG tablet TAKE 1 TABLET BY MOUTH DAILY 90 tablet 1   ibuprofen (ADVIL,MOTRIN) 200 MG tablet Take 200 mg by mouth every 6 (six) hours as needed.     simvastatin (ZOCOR) 20 MG tablet TAKE 1 TABLET(20 MG) BY MOUTH EVERY OTHER DAY 45 tablet 3   SYNTHROID 75 MCG tablet TAKE 1 TABLET BY MOUTH DAILY 90 tablet 3   vitamin B-12 (CYANOCOBALAMIN) 1000 MCG tablet Take 1,000 mcg by mouth 2 (two) times a week.     Zinc 20 MG CAPS Take by mouth.     metFORMIN (GLUCOPHAGE) 500 MG tablet TAKE 2 TABLETS(1000 MG) BY MOUTH TWICE DAILY WITH A MEAL (Patient not taking: Reported on 09/05/2023) 360 tablet 1   No current facility-administered medications on file prior to visit.    BP 130/80   Pulse 72   Temp 97.6 F (36.4 C) (Oral)   Ht 4\' 11"  (1.499 m)   Wt 111 lb (50.3 kg)   SpO2 99%   BMI 22.42 kg/m       Objective:   Physical Exam Vitals and nursing note reviewed.  Constitutional:      General: She is not in acute distress.    Appearance: Normal appearance. She is not ill-appearing.  HENT:     Head: Normocephalic and atraumatic.     Right Ear: Tympanic membrane, ear canal and external ear normal. There is no impacted cerumen.     Left Ear: Tympanic membrane, ear canal and external ear normal. There is no impacted cerumen.     Nose: Nose normal. No congestion or rhinorrhea.     Mouth/Throat:     Mouth: Mucous membranes are moist.     Pharynx: Oropharynx is clear.  Eyes:     Extraocular Movements: Extraocular movements intact.     Conjunctiva/sclera: Conjunctivae normal.     Pupils: Pupils are equal, round, and reactive to light.  Neck:     Vascular: No carotid bruit.  Cardiovascular:     Rate and Rhythm: Normal rate and regular rhythm.  Pulses: Normal pulses.     Heart sounds: No murmur heard.    No friction rub. No  gallop.  Pulmonary:     Effort: Pulmonary effort is normal.     Breath sounds: Normal breath sounds.  Abdominal:     General: Abdomen is flat. Bowel sounds are normal. There is no distension.     Palpations: Abdomen is soft. There is no mass.     Tenderness: There is no abdominal tenderness. There is no guarding or rebound.     Hernia: No hernia is present.  Musculoskeletal:        General: Normal range of motion.     Cervical back: Normal range of motion and neck supple.  Lymphadenopathy:     Cervical: No cervical adenopathy.  Skin:    General: Skin is warm and dry.     Capillary Refill: Capillary refill takes less than 2 seconds.  Neurological:     General: No focal deficit present.     Mental Status: She is alert and oriented to person, place, and time.  Psychiatric:        Mood and Affect: Mood normal.        Speech: Speech normal.        Behavior: Behavior normal.        Thought Content: Thought content normal.        Judgment: Judgment normal.     Comments: Word finding difficulty noted today        Assessment & Plan:  1. Routine general medical examination at a health care facility (Primary) Today patient counseled on age appropriate routine health concerns for screening and prevention, each reviewed and up to date or declined. Immunizations reviewed and up to date or declined. Labs ordered and reviewed. Risk factors for depression reviewed and negative. Hearing function and visual acuity are intact. ADLs screened and addressed as needed. Functional ability and level of safety reviewed and appropriate. Education, counseling and referrals performed based on assessed risks today. Patient provided with a copy of personalized plan for preventive services. - Follow up in one year or sooner  - Continue to eat healthy and stay active   2. Prediabetes - Consider adding back Metformin  - CBC with Differential/Platelet; Future - Comprehensive metabolic panel; Future - Lipid  panel; Future - TSH; Future - Hemoglobin A1c; Future  3. Essential hypertension  - Controlled. No change in medication  - CBC with Differential/Platelet; Future - Comprehensive metabolic panel; Future - Lipid panel; Future - TSH; Future - Hemoglobin A1c; Future  4. Mixed hyperlipidemia - Continue simvastatin  - CBC with Differential/Platelet; Future - Comprehensive metabolic panel; Future - Lipid panel; Future - TSH; Future - Hemoglobin A1c; Future - simvastatin (ZOCOR) 20 MG tablet; TAKE 1 TABLET(20 MG) BY MOUTH EVERY OTHER DAY  Dispense: 45 tablet; Refill: 3  5. Hypothyroidism, unspecified type - Consider dose change of synthroid  - CBC with Differential/Platelet; Future - Comprehensive metabolic panel; Future - Lipid panel; Future - TSH; Future - Hemoglobin A1c; Future  6. Confusion and disorientation - Mild work finding difficulty today  - Follow up with Neurology and Neuropsych - CBC with Differential/Platelet; Future - Comprehensive metabolic panel; Future - Lipid panel; Future - TSH; Future - Hemoglobin A1c; Future  Shirline Frees, NP

## 2023-09-07 ENCOUNTER — Other Ambulatory Visit: Payer: Self-pay

## 2023-09-12 ENCOUNTER — Telehealth: Payer: Self-pay | Admitting: Adult Health

## 2023-09-12 NOTE — Telephone Encounter (Signed)
 Patient notified of update  and verbalized understanding.

## 2023-09-12 NOTE — Telephone Encounter (Signed)
**Note De-identified Hilliary Jock Obfuscation** Please advise 

## 2023-09-12 NOTE — Telephone Encounter (Signed)
 Pt came in and stated she spoke to [facility] about an extended release Metformin. However, she still has almost a full amount of her current prescription Metformin that is not the extended release. She would like to know if it was okay to her to continue to take what she has or if she needs to switch to the extended release Metformin. Call back number: [phone].

## 2023-10-04 ENCOUNTER — Encounter: Payer: Self-pay | Admitting: Adult Health

## 2023-10-04 ENCOUNTER — Ambulatory Visit: Admitting: Adult Health

## 2023-10-04 VITALS — BP 130/90 | HR 72 | Temp 98.1°F | Ht 59.0 in | Wt 110.0 lb

## 2023-10-04 DIAGNOSIS — B349 Viral infection, unspecified: Secondary | ICD-10-CM | POA: Diagnosis not present

## 2023-10-04 DIAGNOSIS — R051 Acute cough: Secondary | ICD-10-CM

## 2023-10-04 DIAGNOSIS — Z7984 Long term (current) use of oral hypoglycemic drugs: Secondary | ICD-10-CM | POA: Diagnosis not present

## 2023-10-04 DIAGNOSIS — E119 Type 2 diabetes mellitus without complications: Secondary | ICD-10-CM

## 2023-10-04 LAB — POCT INFLUENZA A/B
Influenza A, POC: NEGATIVE
Influenza B, POC: NEGATIVE

## 2023-10-04 LAB — POC COVID19 BINAXNOW: SARS Coronavirus 2 Ag: NEGATIVE

## 2023-10-04 MED ORDER — BENZONATATE 200 MG PO CAPS: 200.0000 mg | ORAL_CAPSULE | Freq: Three times a day (TID) | ORAL | 1 refills | Status: DC | PRN

## 2023-10-04 MED ORDER — METFORMIN HCL 500 MG PO TABS: ORAL_TABLET | ORAL | 1 refills | Status: DC

## 2023-10-04 NOTE — Progress Notes (Signed)
 Subjective:    Patient ID: Gabrielle Vargas, female    DOB: July 12, 1951, 73 y.o.   MRN: [id number]  Cough Associated symptoms include a fever.  Fever  Associated symptoms include coughing.   73 year old female who  has a past medical history of ADHD (attention deficit hyperactivity disorder) (05/09/2007), Diverticulitis of colon (without mention of hemorrhage) (09/17/2013), Essential hypertension (10/09/2017), History of COVID-19 (2021), Hyperlipidemia, Hypothyroidism, Low back pain radiating to right leg (10/09/2017), Mild cognitive impairment of uncertain or unknown etiology (08/04/2022), Mild intermittent asthma with acute exacerbation (05/30/2017), Osteoarthritis, Postmenopausal atrophic vaginitis, severe (08/18/2008), Right knee pain (03/13/2014), S/P arthroscopy of right knee (06/03/2014), and Vitamin B12 deficiency.  She presents to the office today for an acute issue. Her symptoms started yesterday. Her symptoms include that of cough, low grade fever up to 99.7, headache and chills. No sick contacts. She has been using Nyquil for symptom relief and last took a dose about about 6 hours ago.   She denies n/v/d/shortness of breath or wheezing.    Review of Systems  Constitutional:  Positive for fever.  Respiratory:  Positive for cough.    See HPI   Past Medical History:  Diagnosis Date   ADHD (attention deficit hyperactivity disorder) 05/09/2007   Diverticulitis of colon (without mention of hemorrhage) 09/17/2013   Essential hypertension 10/09/2017   History of COVID-19 2021   Hyperlipidemia    Hypothyroidism    Low back pain radiating to right leg 10/09/2017   Mild cognitive impairment of uncertain or unknown etiology 08/04/2022   Mild intermittent asthma with acute exacerbation 05/30/2017   Osteoarthritis    Postmenopausal atrophic vaginitis, severe 08/18/2008   Right knee pain 03/13/2014   S/P arthroscopy of right knee 06/03/2014   Vitamin B12 deficiency      Social History   Socioeconomic History   Marital status: Married    Spouse name: Not on file   Number of children: 2   Years of education: 13   Highest education level: Some college, no degree  Occupational History   Occupation: Retired  Tobacco Use   Smoking status: Never   Smokeless tobacco: Never  Vaping Use   Vaping status: Never Used  Substance and Sexual Activity   Alcohol use: No   Drug use: No   Sexual activity: Not on file  Other Topics Concern   Not on file  Social History Narrative   Retired   Married   2 children   Right handed   Live with husband   Social Drivers of Corporate investment banker Strain: Low Risk  (12/23/2022)   Overall Financial Resource Strain (CARDIA)    Difficulty of Paying Living Expenses: Not hard at all  Food Insecurity: No Food Insecurity (12/23/2022)   Hunger Vital Sign    Worried About Running Out of Food in the Last Year: Never true    Ran Out of Food in the Last Year: Never true  Transportation Needs: No Transportation Needs (12/23/2022)   PRAPARE - Administrator, Civil Service (Medical): No    Lack of Transportation (Non-Medical): No  Physical Activity: Sufficiently Active (12/23/2022)   Exercise Vital Sign    Days of Exercise per Week: 7 days    Minutes of Exercise per Session: 60 min  Stress: No Stress Concern Present (12/23/2022)   [employer] of Occupational Health - Occupational Stress Questionnaire    Feeling of Stress : Not at all  Social Connections: Socially Integrated (12/23/2022)  Social Advertising account executive [NHANES]    Frequency of Communication with Friends and Family: More than three times a week    Frequency of Social Gatherings with Friends and Family: More than three times a week    Attends Religious Services: More than 4 times per year    Active Member of [employer] or Organizations: Yes    Attends Engineer, structural: More than 4 times per year    Marital Status: Married   Catering manager Violence: Not At Risk (12/23/2022)   Humiliation, Afraid, Rape, and Kick questionnaire    Fear of Current or Ex-Partner: No    Emotionally Abused: No    Physically Abused: No    Sexually Abused: No    Past Surgical History:  Procedure Laterality Date   ABDOMINAL HYSTERECTOMY     BREAST SURGERY     CARPAL TUNNEL RELEASE Bilateral    CERVICAL DISC ARTHROPLASTY     CESAREAN SECTION     x2   left shoulder surgery     MENISCUS REPAIR Right 2017   REDUCTION MAMMAPLASTY Bilateral    right shoulder     TONSILLECTOMY      Family History  Problem Relation Age of Onset   Melanoma Mother    Kidney disease Father    Diabetes Father    Heart disease Father    Breast cancer Paternal Grandmother    ADD / ADHD Other        multiple family members   Colon cancer Neg Hx    Esophageal cancer Neg Hx    Rectal cancer Neg Hx    Stomach cancer Neg Hx     Allergies  Allergen Reactions   Latex     REACTION: rash/hives   Oxycodone-Acetaminophen     REACTION: nausea    Current Outpatient Medications on File Prior to Visit  Medication Sig Dispense Refill   Acetylcysteine (NAC) 500 MG CAPS Take by mouth.     amphetamine-dextroamphetamine (ADDERALL) 20 MG tablet Take 1 tablet (20 mg total) by mouth 2 (two) times daily. 60 tablet 0   amphetamine-dextroamphetamine (ADDERALL) 20 MG tablet Take 1 tablet (20 mg total) by mouth 2 (two) times daily. 60 tablet 0   amphetamine-dextroamphetamine (ADDERALL) 20 MG tablet Take 1 tablet (20 mg total) by mouth 2 (two) times daily. 60 tablet 0   Ascorbic Acid (VITAMIN C) 1000 MG tablet Take 1,000 mg by mouth daily.     Cholecalciferol (D3-50 PO) Take by mouth.     Evening Primrose Oil 1000 MG CAPS Take 1,300 mg by mouth in the morning and at bedtime.     ezetimibe (ZETIA) 10 MG tablet TAKE 1 TABLET BY MOUTH DAILY 90 tablet 3   hydrochlorothiazide (HYDRODIURIL) 25 MG tablet TAKE 1 TABLET BY MOUTH DAILY 90 tablet 1   ibuprofen (ADVIL,MOTRIN)  200 MG tablet Take 200 mg by mouth every 6 (six) hours as needed.     metFORMIN (GLUCOPHAGE) 500 MG tablet TAKE 2 TABLETS(1000 MG) BY MOUTH TWICE DAILY WITH A MEAL 360 tablet 1   simvastatin (ZOCOR) 20 MG tablet TAKE 1 TABLET(20 MG) BY MOUTH EVERY OTHER DAY 45 tablet 3   SYNTHROID 75 MCG tablet TAKE 1 TABLET BY MOUTH DAILY 90 tablet 3   vitamin B-12 (CYANOCOBALAMIN) 1000 MCG tablet Take 1,000 mcg by mouth 2 (two) times a week.     Zinc 20 MG CAPS Take by mouth.     No current facility-administered medications on file prior to  visit.    BP (!) 130/90   Pulse 72   Temp 98.1 F (36.7 C) (Oral)   Ht 4\' 11"  (1.499 m)   Wt 110 lb (49.9 kg)   SpO2 98%   BMI 22.22 kg/m       Objective:   Physical Exam Vitals and nursing note reviewed.  Constitutional:      Appearance: Normal appearance.  HENT:     Right Ear: Tympanic membrane, ear canal and external ear normal. There is no impacted cerumen.     Left Ear: Tympanic membrane, ear canal and external ear normal. There is no impacted cerumen.     Nose: Nose normal. No congestion or rhinorrhea.     Mouth/Throat:     Mouth: Mucous membranes are moist.     Pharynx: Oropharynx is clear. No oropharyngeal exudate.  Cardiovascular:     Rate and Rhythm: Normal rate and regular rhythm.     Pulses: Normal pulses.     Heart sounds: Normal heart sounds.  Pulmonary:     Effort: Pulmonary effort is normal.     Breath sounds: Normal breath sounds.  Musculoskeletal:        General: Normal range of motion.  Skin:    General: Skin is warm and dry.  Neurological:     General: No focal deficit present.     Mental Status: She is alert and oriented to person, place, and time.  Psychiatric:        Mood and Affect: Mood normal.        Behavior: Behavior normal.        Thought Content: Thought content normal.        Judgment: Judgment normal.        Assessment & Plan:   1. Viral infection (Primary) - Covid and flu tests negative - Advised  symptoms likely due to viral illness. Rest and hydrate - Follow up if not resolving in the next 4-5 days   2. Acute cough  - benzonatate (TESSALON) 200 MG capsule; Take 1 capsule (200 mg total) by mouth 3 (three) times daily as needed.  Dispense: 30 capsule; Refill: 1  3. Diabetes mellitus treated with oral medication (HCC) - Needs refill  - metFORMIN (GLUCOPHAGE) 500 MG tablet; TAKE 2 TABLETS(1000 MG) BY MOUTH TWICE DAILY WITH A MEAL  Dispense: 360 tablet; Refill: 1

## 2023-10-08 ENCOUNTER — Other Ambulatory Visit: Payer: Self-pay

## 2023-10-08 ENCOUNTER — Emergency Department (HOSPITAL_COMMUNITY)

## 2023-10-08 ENCOUNTER — Inpatient Hospital Stay (HOSPITAL_COMMUNITY)
Admission: EM | Admit: 2023-10-08 | Discharge: 2023-10-12 | DRG: 040 | Disposition: A | Discharge status: Rehabilitation Facility | Attending: Neurology | Admitting: Neurology

## 2023-10-08 ENCOUNTER — Encounter (HOSPITAL_COMMUNITY): Payer: Self-pay

## 2023-10-08 DIAGNOSIS — Z79899 Other long term (current) drug therapy: Secondary | ICD-10-CM | POA: Diagnosis not present

## 2023-10-08 DIAGNOSIS — G8194 Hemiplegia, unspecified affecting left nondominant side: Secondary | ICD-10-CM | POA: Diagnosis not present

## 2023-10-08 DIAGNOSIS — R9431 Abnormal electrocardiogram [ECG] [EKG]: Secondary | ICD-10-CM | POA: Diagnosis not present

## 2023-10-08 DIAGNOSIS — I472 Ventricular tachycardia, unspecified: Secondary | ICD-10-CM | POA: Diagnosis not present

## 2023-10-08 DIAGNOSIS — M1990 Unspecified osteoarthritis, unspecified site: Secondary | ICD-10-CM | POA: Diagnosis present

## 2023-10-08 DIAGNOSIS — Z808 Family history of malignant neoplasm of other organs or systems: Secondary | ICD-10-CM | POA: Diagnosis not present

## 2023-10-08 DIAGNOSIS — Z833 Family history of diabetes mellitus: Secondary | ICD-10-CM

## 2023-10-08 DIAGNOSIS — Z841 Family history of disorders of kidney and ureter: Secondary | ICD-10-CM

## 2023-10-08 DIAGNOSIS — I639 Cerebral infarction, unspecified: Secondary | ICD-10-CM | POA: Diagnosis not present

## 2023-10-08 DIAGNOSIS — I5020 Unspecified systolic (congestive) heart failure: Secondary | ICD-10-CM | POA: Diagnosis not present

## 2023-10-08 DIAGNOSIS — I700 Atherosclerosis of aorta: Secondary | ICD-10-CM | POA: Diagnosis present

## 2023-10-08 DIAGNOSIS — I6340 Cerebral infarction due to embolism of unspecified cerebral artery: Secondary | ICD-10-CM | POA: Diagnosis not present

## 2023-10-08 DIAGNOSIS — E785 Hyperlipidemia, unspecified: Secondary | ICD-10-CM | POA: Diagnosis not present

## 2023-10-08 DIAGNOSIS — I110 Hypertensive heart disease with heart failure: Secondary | ICD-10-CM | POA: Diagnosis present

## 2023-10-08 DIAGNOSIS — I491 Atrial premature depolarization: Secondary | ICD-10-CM | POA: Diagnosis present

## 2023-10-08 DIAGNOSIS — Z981 Arthrodesis status: Secondary | ICD-10-CM

## 2023-10-08 DIAGNOSIS — R29810 Facial weakness: Secondary | ICD-10-CM | POA: Diagnosis present

## 2023-10-08 DIAGNOSIS — E039 Hypothyroidism, unspecified: Secondary | ICD-10-CM | POA: Diagnosis not present

## 2023-10-08 DIAGNOSIS — R29708 NIHSS score 8: Secondary | ICD-10-CM | POA: Diagnosis not present

## 2023-10-08 DIAGNOSIS — Z8616 Personal history of COVID-19: Secondary | ICD-10-CM | POA: Diagnosis not present

## 2023-10-08 DIAGNOSIS — F909 Attention-deficit hyperactivity disorder, unspecified type: Secondary | ICD-10-CM | POA: Diagnosis present

## 2023-10-08 DIAGNOSIS — J4520 Mild intermittent asthma, uncomplicated: Secondary | ICD-10-CM | POA: Diagnosis present

## 2023-10-08 DIAGNOSIS — Z803 Family history of malignant neoplasm of breast: Secondary | ICD-10-CM | POA: Diagnosis not present

## 2023-10-08 DIAGNOSIS — I69391 Dysphagia following cerebral infarction: Secondary | ICD-10-CM | POA: Diagnosis not present

## 2023-10-08 DIAGNOSIS — I4729 Other ventricular tachycardia: Secondary | ICD-10-CM | POA: Diagnosis not present

## 2023-10-08 DIAGNOSIS — I69354 Hemiplegia and hemiparesis following cerebral infarction affecting left non-dominant side: Secondary | ICD-10-CM | POA: Diagnosis not present

## 2023-10-08 DIAGNOSIS — I672 Cerebral atherosclerosis: Secondary | ICD-10-CM | POA: Diagnosis not present

## 2023-10-08 DIAGNOSIS — I6522 Occlusion and stenosis of left carotid artery: Secondary | ICD-10-CM | POA: Diagnosis not present

## 2023-10-08 DIAGNOSIS — I671 Cerebral aneurysm, nonruptured: Secondary | ICD-10-CM | POA: Diagnosis not present

## 2023-10-08 DIAGNOSIS — I69398 Other sequelae of cerebral infarction: Secondary | ICD-10-CM | POA: Diagnosis not present

## 2023-10-08 DIAGNOSIS — E119 Type 2 diabetes mellitus without complications: Secondary | ICD-10-CM | POA: Diagnosis present

## 2023-10-08 DIAGNOSIS — R414 Neurologic neglect syndrome: Secondary | ICD-10-CM | POA: Diagnosis present

## 2023-10-08 DIAGNOSIS — R1310 Dysphagia, unspecified: Secondary | ICD-10-CM | POA: Diagnosis present

## 2023-10-08 DIAGNOSIS — Z7984 Long term (current) use of oral hypoglycemic drugs: Secondary | ICD-10-CM | POA: Diagnosis not present

## 2023-10-08 DIAGNOSIS — I771 Stricture of artery: Secondary | ICD-10-CM | POA: Diagnosis not present

## 2023-10-08 DIAGNOSIS — R29818 Other symptoms and signs involving the nervous system: Secondary | ICD-10-CM | POA: Diagnosis not present

## 2023-10-08 DIAGNOSIS — Z79890 Hormone replacement therapy (postmenopausal): Secondary | ICD-10-CM

## 2023-10-08 DIAGNOSIS — R569 Unspecified convulsions: Secondary | ICD-10-CM | POA: Diagnosis not present

## 2023-10-08 DIAGNOSIS — I63133 Cerebral infarction due to embolism of bilateral carotid arteries: Secondary | ICD-10-CM | POA: Diagnosis not present

## 2023-10-08 DIAGNOSIS — D696 Thrombocytopenia, unspecified: Secondary | ICD-10-CM | POA: Diagnosis not present

## 2023-10-08 DIAGNOSIS — I509 Heart failure, unspecified: Secondary | ICD-10-CM | POA: Diagnosis not present

## 2023-10-08 DIAGNOSIS — I5041 Acute combined systolic (congestive) and diastolic (congestive) heart failure: Secondary | ICD-10-CM | POA: Diagnosis not present

## 2023-10-08 DIAGNOSIS — Z8249 Family history of ischemic heart disease and other diseases of the circulatory system: Secondary | ICD-10-CM

## 2023-10-08 DIAGNOSIS — R4701 Aphasia: Secondary | ICD-10-CM | POA: Diagnosis present

## 2023-10-08 DIAGNOSIS — F03A0 Unspecified dementia, mild, without behavioral disturbance, psychotic disturbance, mood disturbance, and anxiety: Secondary | ICD-10-CM | POA: Diagnosis present

## 2023-10-08 DIAGNOSIS — G319 Degenerative disease of nervous system, unspecified: Secondary | ICD-10-CM | POA: Diagnosis not present

## 2023-10-08 DIAGNOSIS — I351 Nonrheumatic aortic (valve) insufficiency: Secondary | ICD-10-CM | POA: Diagnosis not present

## 2023-10-08 DIAGNOSIS — E538 Deficiency of other specified B group vitamins: Secondary | ICD-10-CM | POA: Diagnosis not present

## 2023-10-08 DIAGNOSIS — I69392 Facial weakness following cerebral infarction: Secondary | ICD-10-CM | POA: Diagnosis not present

## 2023-10-08 DIAGNOSIS — I6310 Cerebral infarction due to embolism of unspecified precerebral artery: Secondary | ICD-10-CM | POA: Diagnosis not present

## 2023-10-08 DIAGNOSIS — R4702 Dysphasia: Secondary | ICD-10-CM | POA: Diagnosis present

## 2023-10-08 DIAGNOSIS — I6389 Other cerebral infarction: Secondary | ICD-10-CM | POA: Diagnosis not present

## 2023-10-08 DIAGNOSIS — Z91040 Latex allergy status: Secondary | ICD-10-CM

## 2023-10-08 DIAGNOSIS — R29702 NIHSS score 2: Secondary | ICD-10-CM | POA: Diagnosis not present

## 2023-10-08 DIAGNOSIS — H5340 Unspecified visual field defects: Secondary | ICD-10-CM | POA: Diagnosis present

## 2023-10-08 DIAGNOSIS — E876 Hypokalemia: Secondary | ICD-10-CM | POA: Diagnosis not present

## 2023-10-08 DIAGNOSIS — R29724 NIHSS score 24: Secondary | ICD-10-CM | POA: Diagnosis not present

## 2023-10-08 DIAGNOSIS — I358 Other nonrheumatic aortic valve disorders: Secondary | ICD-10-CM | POA: Diagnosis present

## 2023-10-08 DIAGNOSIS — R0902 Hypoxemia: Secondary | ICD-10-CM | POA: Diagnosis not present

## 2023-10-08 DIAGNOSIS — G8929 Other chronic pain: Secondary | ICD-10-CM | POA: Diagnosis present

## 2023-10-08 DIAGNOSIS — I63511 Cerebral infarction due to unspecified occlusion or stenosis of right middle cerebral artery: Secondary | ICD-10-CM | POA: Diagnosis present

## 2023-10-08 DIAGNOSIS — R059 Cough, unspecified: Secondary | ICD-10-CM | POA: Diagnosis not present

## 2023-10-08 DIAGNOSIS — K5900 Constipation, unspecified: Secondary | ICD-10-CM | POA: Diagnosis not present

## 2023-10-08 DIAGNOSIS — I10 Essential (primary) hypertension: Secondary | ICD-10-CM | POA: Diagnosis not present

## 2023-10-08 DIAGNOSIS — I63411 Cerebral infarction due to embolism of right middle cerebral artery: Secondary | ICD-10-CM | POA: Diagnosis not present

## 2023-10-08 DIAGNOSIS — Z90710 Acquired absence of both cervix and uterus: Secondary | ICD-10-CM

## 2023-10-08 DIAGNOSIS — Z885 Allergy status to narcotic agent status: Secondary | ICD-10-CM

## 2023-10-08 DIAGNOSIS — I69320 Aphasia following cerebral infarction: Secondary | ICD-10-CM | POA: Diagnosis not present

## 2023-10-08 DIAGNOSIS — I6523 Occlusion and stenosis of bilateral carotid arteries: Secondary | ICD-10-CM | POA: Diagnosis not present

## 2023-10-08 HISTORY — DX: Cerebral infarction, unspecified: I63.9

## 2023-10-08 LAB — I-STAT CHEM 8, ED
BUN: 13 mg/dL (ref 8–23)
Calcium, Ion: 1.08 mmol/L — ABNORMAL LOW (ref 1.15–1.40)
Chloride: 93 mmol/L — ABNORMAL LOW (ref 98–111)
Creatinine, Ser: 0.7 mg/dL (ref 0.44–1.00)
Glucose, Bld: 110 mg/dL — ABNORMAL HIGH (ref 70–99)
HCT: 38 % (ref 36.0–46.0)
Hemoglobin: 12.9 g/dL (ref 12.0–15.0)
Potassium: 2.8 mmol/L — ABNORMAL LOW (ref 3.5–5.1)
Sodium: 133 mmol/L — ABNORMAL LOW (ref 135–145)
TCO2: 29 mmol/L (ref 22–32)

## 2023-10-08 LAB — APTT: aPTT: 27 s (ref 24–36)

## 2023-10-08 LAB — URINALYSIS, ROUTINE W REFLEX MICROSCOPIC
Bilirubin Urine: NEGATIVE
Glucose, UA: NEGATIVE mg/dL
Hgb urine dipstick: NEGATIVE
Ketones, ur: NEGATIVE mg/dL
Leukocytes,Ua: NEGATIVE
Nitrite: NEGATIVE
Protein, ur: NEGATIVE mg/dL
Specific Gravity, Urine: 1.015 (ref 1.005–1.030)
pH: 7 (ref 5.0–8.0)

## 2023-10-08 LAB — DIFFERENTIAL
Abs Immature Granulocytes: 0.01 10*3/uL (ref 0.00–0.07)
Basophils Absolute: 0 10*3/uL (ref 0.0–0.1)
Basophils Relative: 0 %
Eosinophils Absolute: 0 10*3/uL (ref 0.0–0.5)
Eosinophils Relative: 0 %
Immature Granulocytes: 0 %
Lymphocytes Relative: 44 %
Lymphs Abs: 2.6 10*3/uL (ref 0.7–4.0)
Monocytes Absolute: 0.5 10*3/uL (ref 0.1–1.0)
Monocytes Relative: 8 %
Neutro Abs: 2.7 10*3/uL (ref 1.7–7.7)
Neutrophils Relative %: 48 %

## 2023-10-08 LAB — COMPREHENSIVE METABOLIC PANEL
ALT: 17 U/L (ref 0–44)
AST: 24 U/L (ref 15–41)
Albumin: 3.6 g/dL (ref 3.5–5.0)
Alkaline Phosphatase: 73 U/L (ref 38–126)
Anion gap: 12 (ref 5–15)
BUN: 12 mg/dL (ref 8–23)
CO2: 30 mmol/L (ref 22–32)
Calcium: 9.2 mg/dL (ref 8.9–10.3)
Chloride: 93 mmol/L — ABNORMAL LOW (ref 98–111)
Creatinine, Ser: 0.7 mg/dL (ref 0.44–1.00)
GFR, Estimated: >60 mL/min (ref >60)
Glucose, Bld: 117 mg/dL — ABNORMAL HIGH (ref 70–99)
Potassium: 2.9 mmol/L — ABNORMAL LOW (ref 3.5–5.1)
Sodium: 135 mmol/L (ref 135–145)
Total Bilirubin: 0.5 mg/dL (ref 0.0–1.2)
Total Protein: 6.4 g/dL — ABNORMAL LOW (ref 6.5–8.1)

## 2023-10-08 LAB — RAPID URINE DRUG SCREEN, HOSP PERFORMED
Amphetamines: POSITIVE — AB
Barbiturates: NOT DETECTED
Benzodiazepines: NOT DETECTED
Cocaine: NOT DETECTED
Opiates: NOT DETECTED
Tetrahydrocannabinol: NOT DETECTED

## 2023-10-08 LAB — CBC
HCT: 39.6 % (ref 36.0–46.0)
Hemoglobin: 13.4 g/dL (ref 12.0–15.0)
MCH: 28.7 pg (ref 26.0–34.0)
MCHC: 33.8 g/dL (ref 30.0–36.0)
MCV: 84.8 fL (ref 80.0–100.0)
Platelets: 177 10*3/uL (ref 150–400)
RBC: 4.67 MIL/uL (ref 3.87–5.11)
RDW: 12.4 % (ref 11.5–15.5)
WBC: 5.8 10*3/uL (ref 4.0–10.5)
nRBC: 0 % (ref 0.0–0.2)

## 2023-10-08 LAB — PROTIME-INR
INR: 1 (ref 0.8–1.2)
Prothrombin Time: 13.3 s (ref 11.4–15.2)

## 2023-10-08 LAB — CBG MONITORING, ED: Glucose-Capillary: 106 mg/dL — ABNORMAL HIGH (ref 70–99)

## 2023-10-08 LAB — MRSA NEXT GEN BY PCR, NASAL: MRSA by PCR Next Gen: NOT DETECTED

## 2023-10-08 LAB — ETHANOL: Alcohol, Ethyl (B): <10 mg/dL (ref <10)

## 2023-10-08 LAB — GLUCOSE, CAPILLARY: Glucose-Capillary: 170 mg/dL — ABNORMAL HIGH (ref 70–99)

## 2023-10-08 MED ORDER — ACETAMINOPHEN 160 MG/5ML PO SOLN: 650.0000 mg | ORAL | Status: DC | PRN

## 2023-10-08 MED ORDER — LORAZEPAM 2 MG/ML IJ SOLN
1.0000 mg | Freq: Once | INTRAMUSCULAR | Status: AC
  Administered 2023-10-08: 1 mg via INTRAVENOUS
  Filled 2023-10-08: qty 1

## 2023-10-08 MED ORDER — ACETAMINOPHEN 325 MG PO TABS: 650.0000 mg | ORAL_TABLET | ORAL | Status: DC | PRN

## 2023-10-08 MED ORDER — LEVOTHYROXINE SODIUM 75 MCG PO TABS
75.0000 ug | ORAL_TABLET | Freq: Every day | ORAL | Status: DC
  Administered 2023-10-11 – 2023-10-12 (×2): 75 ug via ORAL
  Filled 2023-10-08 (×2): qty 1

## 2023-10-08 MED ORDER — POTASSIUM CHLORIDE 10 MEQ/100ML IV SOLN
10.0000 meq | INTRAVENOUS | Status: AC
  Administered 2023-10-08 – 2023-10-09 (×6): 10 meq via INTRAVENOUS
  Filled 2023-10-08 (×6): qty 100

## 2023-10-08 MED ORDER — ZIPRASIDONE MESYLATE 20 MG IM SOLR
20.0000 mg | Freq: Once | INTRAMUSCULAR | Status: AC
  Administered 2023-10-08: 20 mg via INTRAMUSCULAR
  Filled 2023-10-08: qty 20

## 2023-10-08 MED ORDER — STERILE WATER FOR INJECTION IJ SOLN
INTRAMUSCULAR | Status: AC
  Administered 2023-10-08: 1.2 mL
  Filled 2023-10-08: qty 10

## 2023-10-08 MED ORDER — ACETAMINOPHEN 650 MG RE SUPP: 650.0000 mg | RECTAL | Status: DC | PRN

## 2023-10-08 MED ORDER — SENNOSIDES-DOCUSATE SODIUM 8.6-50 MG PO TABS: 1.0000 | ORAL_TABLET | Freq: Every evening | ORAL | Status: DC | PRN

## 2023-10-08 MED ORDER — SODIUM CHLORIDE 0.9 % IV SOLN: INTRAVENOUS | Status: AC

## 2023-10-08 MED ORDER — ORAL CARE MOUTH RINSE: 15.0000 mL | OROMUCOSAL | Status: DC | PRN

## 2023-10-08 MED ORDER — PANTOPRAZOLE SODIUM 40 MG IV SOLR
40.0000 mg | Freq: Every day | INTRAVENOUS | Status: DC
  Administered 2023-10-08 – 2023-10-09 (×2): 40 mg via INTRAVENOUS
  Filled 2023-10-08 (×2): qty 10

## 2023-10-08 MED ORDER — DEXMEDETOMIDINE HCL IN NACL 400 MCG/100ML IV SOLN: 0.0000 ug/kg/h | INTRAVENOUS | Status: DC

## 2023-10-08 MED ORDER — POTASSIUM CHLORIDE 20 MEQ PO PACK: 40.0000 meq | PACK | Freq: Two times a day (BID) | ORAL | Status: DC

## 2023-10-08 MED ORDER — SIMVASTATIN 20 MG PO TABS: 20.0000 mg | ORAL_TABLET | Freq: Every day | ORAL | Status: DC

## 2023-10-08 MED ORDER — TENECTEPLASE FOR STROKE
0.2500 mg/kg | PACK | Freq: Once | INTRAVENOUS | Status: AC
  Administered 2023-10-08: 12 mg via INTRAVENOUS
  Filled 2023-10-08: qty 10

## 2023-10-08 MED ORDER — VITAMIN B-12 1000 MCG PO TABS
1000.0000 ug | ORAL_TABLET | ORAL | Status: DC
  Administered 2023-10-12: 1000 ug via ORAL
  Filled 2023-10-08: qty 1

## 2023-10-08 MED ORDER — INSULIN ASPART 100 UNIT/ML IJ SOLN: 0.0000 [IU] | Freq: Three times a day (TID) | INTRAMUSCULAR | Status: DC

## 2023-10-08 MED ORDER — ONDANSETRON HCL 4 MG/2ML IJ SOLN
4.0000 mg | Freq: Once | INTRAMUSCULAR | Status: AC
  Administered 2023-10-08: 4 mg via INTRAVENOUS

## 2023-10-08 MED ORDER — IOHEXOL 350 MG/ML SOLN
75.0000 mL | Freq: Once | INTRAVENOUS | Status: AC | PRN
  Administered 2023-10-08: 75 mL via INTRAVENOUS

## 2023-10-08 MED ORDER — ORAL CARE MOUTH RINSE
15.0000 mL | OROMUCOSAL | Status: DC
  Administered 2023-10-08 – 2023-10-10 (×7): 15 mL via OROMUCOSAL

## 2023-10-08 MED ORDER — POTASSIUM CHLORIDE 20 MEQ PO PACK: 20.0000 meq | PACK | Freq: Two times a day (BID) | ORAL | Status: DC

## 2023-10-08 MED ORDER — CHLORHEXIDINE GLUCONATE CLOTH 2 % EX PADS
6.0000 | MEDICATED_PAD | Freq: Every day | CUTANEOUS | Status: DC
  Administered 2023-10-08 – 2023-10-12 (×5): 6 via TOPICAL

## 2023-10-08 MED ORDER — STROKE: EARLY STAGES OF RECOVERY BOOK
Freq: Once | Status: AC
  Filled 2023-10-08: qty 1

## 2023-10-08 NOTE — H&P (Signed)
 NEUROLOGY ADMISSION HISTORY AND PHYSICAL   Date of service: October 08, 2023 [name] MRN:  [id number] DOB:  Nov 11, 1950 Chief Complaint: "Acute onset of left sided weakness" Requesting Provider: Glendora Score, MD  History of Present Illness  Gabrielle Vargas is a 73 y.o. female with a PMHx of ADHD, HTN, diverticulitis, HLD, hypothyroidism, low back pain, cervical neck fusion, mild cognitive impairment, low back pain radiating to right leg, mild intermittent asthma, osteoarthritis, right knee pain and vitamin B12 deficiency who presents to the ED from home via EMS as a Code Stroke for acute onset of left sided weakness and dysphasia. LKN was 1615. She had sudden onset of L facial droop and speech difficulty with onset witnessed by her husband at 40. When EMS arrived they noted L facial droop, aphasia, L arm and leg weakness and L side visual cut. She in not on any blood thinners. Vitals per EMS: 159/96, HR 79, SpO2 94% on RA, CBG 115.   LKW: 1615 Modified rankin score: 0-Completely asymptomatic and back to baseline post- stroke IV Thrombolysis: Yes EVT:  No: CTA is negative for LVO   NIHSS components Score: Comment  1a Level of Conscious 0[x]  1[]  2[]  3[]      1b LOC Questions 0[x]  1[]  2[]       1c LOC Commands 0[x]  1[]  2[]       2 Best Gaze 0[]  1[x]  2[]       3 Visual 0[]  1[]  2[x]  3[]      4 Facial Palsy 0[]  1[x]  2[]  3[]      5a Motor Arm - left 0[x]  1[]  2[]  3[]  4[]  UN[]    5b Motor Arm - Right 0[x]  1[]  2[]  3[]  4[]  UN[]    6a Motor Leg - Left 0[]  1[x]  2[]  3[]  4[]  UN[]    6b Motor Leg - Right 0[]  1[x]  2[]  3[]  4[]  UN[]    7 Limb Ataxia 0[x]  1[]  2[]  3[]  UN[]     8 Sensory 0[x]  1[]  2[]  UN[]      9 Best Language 0[]  1[x]  2[]  3[]      10 Dysarthria 0[x]  1[]  2[]  UN[]      11 Extinct. and Inattention 0[]  1[x]  2[]       TOTAL:   8      ROS  As per HPI. Detailed ROS deferred in the context of acuity of presentation.   Past History   Past Medical History:  Diagnosis Date    ADHD (attention deficit hyperactivity disorder) 05/09/2007   Diverticulitis of colon (without mention of hemorrhage) 09/17/2013   Essential hypertension 10/09/2017   History of COVID-19 2021   Hyperlipidemia    Hypothyroidism    Low back pain radiating to right leg 10/09/2017   Mild cognitive impairment of uncertain or unknown etiology 08/04/2022   Mild intermittent asthma with acute exacerbation 05/30/2017   Osteoarthritis    Postmenopausal atrophic vaginitis, severe 08/18/2008   Right knee pain 03/13/2014   S/P arthroscopy of right knee 06/03/2014   Vitamin B12 deficiency     Past Surgical History:  Procedure Laterality Date   ABDOMINAL HYSTERECTOMY     BREAST SURGERY     CARPAL TUNNEL RELEASE Bilateral    CERVICAL DISC ARTHROPLASTY     CESAREAN SECTION     x2   left shoulder surgery     MENISCUS REPAIR Right 2017   REDUCTION MAMMAPLASTY Bilateral    right shoulder     TONSILLECTOMY      Family History: Family History  Problem Relation Age of Onset   Melanoma  Mother    Kidney disease Father    Diabetes Father    Heart disease Father    Breast cancer Paternal Grandmother    ADD / ADHD Other        multiple family members   Colon cancer Neg Hx    Esophageal cancer Neg Hx    Rectal cancer Neg Hx    Stomach cancer Neg Hx     Social History  reports that she has never smoked. She has never used smokeless tobacco. She reports that she does not drink alcohol and does not use drugs.  Allergies  Allergen Reactions   Latex     REACTION: rash/hives   Oxycodone-Acetaminophen     REACTION: nausea    Medications  No current facility-administered medications for this encounter.  Current Outpatient Medications:    Acetylcysteine (NAC) 500 MG CAPS, Take by mouth., Disp: , Rfl:    amphetamine-dextroamphetamine (ADDERALL) 20 MG tablet, Take 1 tablet (20 mg total) by mouth 2 (two) times daily., Disp: 60 tablet, Rfl: 0   amphetamine-dextroamphetamine (ADDERALL) 20 MG  tablet, Take 1 tablet (20 mg total) by mouth 2 (two) times daily., Disp: 60 tablet, Rfl: 0   amphetamine-dextroamphetamine (ADDERALL) 20 MG tablet, Take 1 tablet (20 mg total) by mouth 2 (two) times daily., Disp: 60 tablet, Rfl: 0   Ascorbic Acid (VITAMIN C) 1000 MG tablet, Take 1,000 mg by mouth daily., Disp: , Rfl:    benzonatate (TESSALON) 200 MG capsule, Take 1 capsule (200 mg total) by mouth 3 (three) times daily as needed., Disp: 30 capsule, Rfl: 1   Cholecalciferol (D3-50 PO), Take by mouth., Disp: , Rfl:    Evening Primrose Oil 1000 MG CAPS, Take 1,300 mg by mouth in the morning and at bedtime., Disp: , Rfl:    ezetimibe (ZETIA) 10 MG tablet, TAKE 1 TABLET BY MOUTH DAILY, Disp: 90 tablet, Rfl: 3   hydrochlorothiazide (HYDRODIURIL) 25 MG tablet, TAKE 1 TABLET BY MOUTH DAILY, Disp: 90 tablet, Rfl: 1   ibuprofen (ADVIL,MOTRIN) 200 MG tablet, Take 200 mg by mouth every 6 (six) hours as needed., Disp: , Rfl:    metFORMIN (GLUCOPHAGE) 500 MG tablet, TAKE 2 TABLETS(1000 MG) BY MOUTH TWICE DAILY WITH A MEAL, Disp: 360 tablet, Rfl: 1   simvastatin (ZOCOR) 20 MG tablet, TAKE 1 TABLET(20 MG) BY MOUTH EVERY OTHER DAY, Disp: 45 tablet, Rfl: 3   SYNTHROID 75 MCG tablet, TAKE 1 TABLET BY MOUTH DAILY, Disp: 90 tablet, Rfl: 3   vitamin B-12 (CYANOCOBALAMIN) 1000 MCG tablet, Take 1,000 mcg by mouth 2 (two) times a week., Disp: , Rfl:    Zinc 20 MG CAPS, Take by mouth., Disp: , Rfl:   Vitals   Vitals:   10/08/23 1700  Weight: 49.6 kg    Body mass index is 22.09 kg/m.  Physical Exam   Physical Exam  HEENT:  [place]/AT Lungs: Respirations unlabored Extremities: Warm and well perfused.   Neurological Examination Mental Status: Awake and alert. Oriented to her name, age and the month. Speech is sparse and dysfluent. She has mild difficulty with comprehension of some commands and questions. No dysarthria. Mild left sided neglect.   Cranial Nerves: II: Left homonymous hemianopsia. PERRL.   III,IV, VI:  No ptosis. No nystagmus. Hesitancy with gazing to the left, but able to cross the midline volitionally.  V: Reacts to touch equally on left and right.  VII: Mild left facial droop.  VIII: Hearing intact to questions and commands IX,X: No hoarseness  XI: Head is preferentially rotated to the right.  XII: Midline tongue extension Motor: RUE and RLE 5/5 LUE 4/5 against resistance, but no drift.  BLE with bobbing drift. Strength against resistance is decreased on the left, but normal on the right.  Sensory: Light touch intact throughout, bilaterally. Positive for left sided extinction with DSS.  Deep Tendon Reflexes: 2+ and symmetric throughout Cerebellar: No ataxia with FNF bilaterally Gait: Deferred  Labs/Imaging/Neurodiagnostic studies   CBC: No results for input(s): "WBC", "NEUTROABS", "HGB", "HCT", "MCV", "PLT" in the last 168 hours. Basic Metabolic Panel:  Lab Results  Component Value Date   NA 139 09/05/2023   K 3.2 (L) 09/05/2023   CO2 32 09/05/2023   GLUCOSE 119 (H) 09/05/2023   BUN 16 09/05/2023   CREATININE 0.70 09/05/2023   CALCIUM 9.6 09/05/2023   GFRNONAA >60 05/28/2022   GFRAA 86 02/05/2020   Lipid Panel:  Lab Results  Component Value Date   LDLCALC 80 09/05/2023   HgbA1c:  Lab Results  Component Value Date   HGBA1C 6.2 09/05/2023     ASSESSMENT  72 year old female with a PMHx of ADHD, HTN, diverticulitis, HLD, hypothyroidism, low back pain, cervical neck fusion, mild cognitive impairment, low back pain radiating to right leg, mild intermittent asthma, osteoarthritis, right knee pain and vitamin B12 deficiency who presents to the ED from home via EMS as a Code Stroke for acute onset of left sided weakness and dysphasia. LKN was 1615. She had sudden onset of L facial droop and speech difficulty with onset witnessed by her husband at 56. When EMS arrived they noted L facial droop, aphasia, L arm and leg weakness and L side visual cut. She in not on any blood  thinners. She has had recent nosebleeds, but easily stopped and no other recent bleeding.  - Exam reveals left sided motor weakness, left visual field cut and left hemineglect, best referable to an acute lesion in the right cerebral hemisphere, most likely secondary to acute ischemic stroke. NIHSS 8.  - CT head:  Normal CT appearance of the brain. 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 No significant proximal stenosis, aneurysm, or branch vessel occlusion within the Circle of Willis. Minimal atherosclerotic change at the proximal left ICA without significant stenosis. Aortic atherosclerosis  - Incidentally imaged spine findings on CTA: Cervical fusion at C4-5 and C5-6. Slight degenerative anterolisthesis at C2-3 and C3-4. - EKG: Sinus rhythm; Atrial premature complex; Nonspecific T abnormalities, lateral leads - Labs:  - Na 133, K 2.8, glucose 110, ionized calcium 1.08, LFTs normal, BUN and Cr normal - CBC normal.  - U/A normal - UDS positive for amphetamine (on Adderall at home) - After comprehensive review of possible contraindications, she has no absolute contraindications to TNK administration. - The patient is a TNK candidate. Discussed extensively the risks/benefits of TNK treatment vs. no treatment with the patient's husband and children, including risks of hemorrhage and death with TNK administration versus worse overall outcomes on average in patients within the IV thrombolytic time window who are not administered TNK. The patient's dysphasia, left hemineglect and anosognosia precludes meaningful medical decision making on her part at this time. Overall benefits of TNK regarding long-term prognosis are felt to outweigh risks. The patient's family expressed understanding and wish to proceed with TNK.  - There was significant delay in administration of TNK due to extensive discussions with family regarding risks/benefits  and  extra time requested by family to confer on their own prior to making a final decision regarding administration.    RECOMMENDATIONS  - Admitting to Neuro ICU.  - Post-TNK order set to include frequent neuro checks and BP management.  - No antiplatelet medications or anticoagulants for at least 24 hours following TNK.  - DVT prophylaxis with SCDs.  - Continue Zocor   - Will need to be started on antiplatelet therapy if follow up CT at 24 hours is negative for hemorrhagic conversion. - TTE.  - MRI brain - PT/OT/Speech.  - NPO until passes swallow evaluation.  - Telemetry monitoring - Fasting lipid panel, HgbA1c - IVNS at 40 cc/hr.  - KCl IV 10 meq x 6 runs q1h - Klor-con 20 meq po BID starting tomorrow at 8 AM - Code status: Family states that they would like her to be Full Code - Can get Tylenol, her listed allergy to oxycodone-acetaminophen is due to being allergic to oxycodone - No pacemaker or other mechanical devices implanted per family. Can get MRI.  - Continuing home synthroid for her hypothyroidism - Home metformin is for elevated HgbA1c, but does not have a diagnosis of DM. Holding metformin per inpatient protocol. Ordering SSI - Stroke Team to follow in the morning - Discussed in sign out with Dr. Wilford Corner  85 minutes spent in the emergent neurological evaluation and management of this critically ill patient.  ______________________________________________________________________    Dessa Phi, Samar Dass, MD [facility]

## 2023-10-08 NOTE — Progress Notes (Signed)
 PHARMACIST CODE STROKE RESPONSE  Notified to mix TNK at 18:07 by Dr. Otelia Limes TNK preparation completed at 18:09  TNK dose = 12 mg IV over 5 seconds (2.103mL)   Issues/delays encountered (if applicable): Consent   Estill Batten, PharmD, BCCCP  10/08/23 6:13 PM

## 2023-10-08 NOTE — ED Notes (Addendum)
 Upon reassessment, Pt noted to have new slurred speech and new sensory deficit in L face and L arm.  EDP made aware and Pt taken to CT.

## 2023-10-08 NOTE — ED Triage Notes (Signed)
 Pt BIB [facility] from home. Pt had sudden onset of L facial droop and speech difficulty with onset witnessed by her husband at 53. When EMS arrived they noted L facial droop, aphasia, L arm and leg weakness, L side visual cut. She in not on any reported blood thinners.   EMS Vitals   159/96 HR 79 SpO2 94% on RA CBG 115

## 2023-10-08 NOTE — Code Documentation (Addendum)
 Stroke Response Nurse Documentation Code Documentation  Gabrielle Vargas is a 73 y.o. female arriving to [facility]  via [place] [facility] on 10/08/2023 with past medical hx of HLD, HTN. On No antithrombotic. Code stroke was activated by EMS.   Patient from home where she was LKW at 1615 and now complaining of left sided weakness, left facial droop, slurred speech and right gaze.   Stroke team at the bedside on patient arrival. Labs drawn and patient cleared for CT by Dr. Posey Rea. Patient to CT with team.   NIHSS 8, see documentation for details and code stroke times. Patient with right gaze preference , left hemianopia, left facial droop, bilateral leg weakness, expressive aphasia, and left neglect on exam.   The following imaging was completed:  CT Head and CTA. Patient is a candidate for IV Thrombolytic due. Patient is not a candidate for IR due to imaging negative for LVO.   Care Plan:  TNK:  Q15 x 2 hours, q30 x 6 hours, q1 x 16 hours until 24 hour mark  BP < 180/105  Call Neuro for:  New Headache, worsening symptoms, bleeding, nausea/vomiting, or any signs of angioedema.    Process Delays Noted: Pt husband wanted to speak with pt daughter and sister prior to making decision for TNK. Patient and family taken back to patient room for discussion. Team at bedside for decision making.   Bedside handoff with ED RN Gabrielle Vargas.    Gabrielle Vargas  Rapid Response RN   Addendum 10/08/2023 1850:   Stat CT head completed for worsening symptoms. Pt now with dysarthria and sensory changes to her left side. Repeat head CT reviewed by Dr. Otelia Limes.

## 2023-10-08 NOTE — ED Notes (Signed)
 Upon returning to room, Pt has stopped following commands or answering questions.  Also, Pt is becoming increasingly more agitated and trying to pull of monitoring equipment.  EDP made aware.

## 2023-10-08 NOTE — ED Notes (Signed)
 Pt sts she needs to urinate, but doesn't seem to comprehend when she is on the bedpan.  Will attempt again soon.

## 2023-10-08 NOTE — ED Notes (Signed)
 Patient continues to be agitated, pulling off monitoring equipment and attempting to get out of bed. EDP made aware. New orders placed.

## 2023-10-09 ENCOUNTER — Inpatient Hospital Stay (HOSPITAL_COMMUNITY)

## 2023-10-09 DIAGNOSIS — I63411 Cerebral infarction due to embolism of right middle cerebral artery: Secondary | ICD-10-CM

## 2023-10-09 DIAGNOSIS — R569 Unspecified convulsions: Secondary | ICD-10-CM

## 2023-10-09 DIAGNOSIS — R29724 NIHSS score 24: Secondary | ICD-10-CM | POA: Diagnosis not present

## 2023-10-09 DIAGNOSIS — E119 Type 2 diabetes mellitus without complications: Secondary | ICD-10-CM | POA: Diagnosis not present

## 2023-10-09 DIAGNOSIS — I6389 Other cerebral infarction: Secondary | ICD-10-CM

## 2023-10-09 DIAGNOSIS — I69391 Dysphagia following cerebral infarction: Secondary | ICD-10-CM

## 2023-10-09 DIAGNOSIS — E785 Hyperlipidemia, unspecified: Secondary | ICD-10-CM | POA: Diagnosis not present

## 2023-10-09 LAB — BASIC METABOLIC PANEL
Anion gap: 14 (ref 5–15)
BUN: 9 mg/dL (ref 8–23)
CO2: 22 mmol/L (ref 22–32)
Calcium: 8.7 mg/dL — ABNORMAL LOW (ref 8.9–10.3)
Chloride: 93 mmol/L — ABNORMAL LOW (ref 98–111)
Creatinine, Ser: 0.56 mg/dL (ref 0.44–1.00)
GFR, Estimated: >60 mL/min (ref >60)
Glucose, Bld: 98 mg/dL (ref 70–99)
Potassium: 3.5 mmol/L (ref 3.5–5.1)
Sodium: 129 mmol/L — ABNORMAL LOW (ref 135–145)

## 2023-10-09 LAB — GLUCOSE, CAPILLARY
Glucose-Capillary: 94 mg/dL (ref 70–99)
Glucose-Capillary: 109 mg/dL — ABNORMAL HIGH (ref 70–99)
Glucose-Capillary: 111 mg/dL — ABNORMAL HIGH (ref 70–99)
Glucose-Capillary: 95 mg/dL (ref 70–99)

## 2023-10-09 LAB — ECHOCARDIOGRAM COMPLETE
Area-P 1/2: 4.29 cm2
Height: 59 in
S' Lateral: 3.3 cm
Weight: 1749.57 [oz_av]

## 2023-10-09 LAB — CBC
HCT: 39.7 % (ref 36.0–46.0)
Hemoglobin: 14 g/dL (ref 12.0–15.0)
MCH: 28.6 pg (ref 26.0–34.0)
MCHC: 35.3 g/dL (ref 30.0–36.0)
MCV: 81.2 fL (ref 80.0–100.0)
Platelets: 167 10*3/uL (ref 150–400)
RBC: 4.89 MIL/uL (ref 3.87–5.11)
RDW: 12.1 % (ref 11.5–15.5)
WBC: 5.2 10*3/uL (ref 4.0–10.5)
nRBC: 0 % (ref 0.0–0.2)

## 2023-10-09 LAB — LIPID PANEL
Cholesterol: 102 mg/dL (ref 0–200)
HDL: 52 mg/dL (ref >40)
LDL Cholesterol: 33 mg/dL (ref 0–99)
Total CHOL/HDL Ratio: 2 ratio
Triglycerides: 87 mg/dL (ref <150)
VLDL: 17 mg/dL (ref 0–40)

## 2023-10-09 LAB — MAGNESIUM: Magnesium: 1.7 mg/dL (ref 1.7–2.4)

## 2023-10-09 LAB — PHOSPHORUS: Phosphorus: 2.9 mg/dL (ref 2.5–4.6)

## 2023-10-09 MED ORDER — ATORVASTATIN CALCIUM 40 MG PO TABS
40.0000 mg | ORAL_TABLET | Freq: Every day | ORAL | Status: DC
  Administered 2023-10-10 – 2023-10-12 (×3): 40 mg via ORAL
  Filled 2023-10-09 (×3): qty 1

## 2023-10-09 MED ORDER — HALOPERIDOL LACTATE 5 MG/ML IJ SOLN
1.0000 mg | Freq: Four times a day (QID) | INTRAMUSCULAR | Status: DC | PRN
  Administered 2023-10-09: 1 mg via INTRAVENOUS
  Filled 2023-10-09: qty 1

## 2023-10-09 MED ORDER — PERFLUTREN LIPID MICROSPHERE
1.0000 mL | INTRAVENOUS | Status: AC | PRN
  Administered 2023-10-09: 2 mL via INTRAVENOUS

## 2023-10-09 MED ORDER — POTASSIUM CHLORIDE 10 MEQ/100ML IV SOLN
10.0000 meq | INTRAVENOUS | Status: AC
Start: 2023-10-09 — End: 2023-10-09
  Administered 2023-10-09 (×4): 10 meq via INTRAVENOUS
  Filled 2023-10-09 (×4): qty 100

## 2023-10-09 NOTE — Progress Notes (Signed)
 PT Cancellation Note  Patient Details Name: Gabrielle Vargas MRN: [id number] DOB: Nov 30, 1950   Cancelled Treatment:    Reason Eval/Treat Not Completed: Active bedrest order remains this morning. Will continue to follow and evaluate when bedrest lifted by MD.   Vickki Muff, PT, DPT   Acute Rehabilitation Department Office [phone] Secure Chat Communication Preferred   Ronnie Derby 10/09/2023, 8:14 AM

## 2023-10-09 NOTE — Progress Notes (Deleted)
 Chaplain responded to a consult list request, to provide Advance Directive Care form information. Pt was sleeping, Pt's daughter at bedside. Pt's daughter requested Chaplain to come back tomorrow, first time in the morning, to receive the information about the form. Chaplain will follow up tomorrow.  Oneida Alar Chaplain Resident   10/09/23 1645  Spiritual Encounters  Type of Visit Initial  Care provided to: Pt not available;Family  Referral source Clinical staff  Reason for visit Advance directives  OnCall Visit No

## 2023-10-09 NOTE — ED Provider Notes (Signed)
 [facility] NEURO/TRAUMA/SURGICAL ICU Provider Note  CSN: [id number] Arrival date & time: 10/08/23 1709  Chief Complaint(s) Code Stroke (/)  HPI Gabrielle Vargas is a 73 y.o. female with PMH ADHD on Adderall, HLD, hypothyroidism, asthma who presents emergency room for evaluation of left-sided weakness, left facial droop and aphasia.  Last known well 1615.  On arrival patient endorsing left-sided field cut.  Stroke alert called on arrival and additional history unable to obtained due to patient's aphasia.   Past Medical History Past Medical History:  Diagnosis Date   ADHD (attention deficit hyperactivity disorder) 05/09/2007   Diverticulitis of colon (without mention of hemorrhage) 09/17/2013   Essential hypertension 10/09/2017   History of COVID-19 2021   Hyperlipidemia    Hypothyroidism    Low back pain radiating to right leg 10/09/2017   Mild cognitive impairment of uncertain or unknown etiology 08/04/2022   Mild intermittent asthma with acute exacerbation 05/30/2017   Osteoarthritis    Postmenopausal atrophic vaginitis, severe 08/18/2008   Right knee pain 03/13/2014   S/P arthroscopy of right knee 06/03/2014   Vitamin B12 deficiency    Patient Active Problem List   Diagnosis Date Noted   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   MCI (mild cognitive impairment) 08/04/2022   Vitamin B12 deficiency    Osteoarthritis 08/03/2022   Hyperlipidemia 08/03/2022   History of COVID-19 2021   Essential hypertension 10/09/2017   Low back pain radiating to right leg 10/09/2017   Mild intermittent asthma with acute exacerbation 05/30/2017   S/P arthroscopy of right knee 06/03/2014   Right knee pain 03/13/2014   Diverticulitis of colon (without mention of hemorrhage) 09/17/2013   Postmenopausal atrophic vaginitis, severe 08/18/2008   Hypothyroidism 05/09/2007   ADHD (attention deficit hyperactivity disorder) 05/09/2007   Home Medication(s) Prior to Admission medications   Medication  Sig Start Date End Date Taking? Authorizing Provider  Acetylcysteine (NAC) 500 MG CAPS Take 500 mg by mouth daily.   Yes [provider]  amphetamine-dextroamphetamine (ADDERALL) 20 MG tablet Take 1 tablet (20 mg total) by mouth 2 (two) times daily. Patient taking differently: Take 20 mg by mouth daily. 08/29/23  Yes Nafziger, Kandee Keen, NP  Ascorbic Acid (VITAMIN C) 1000 MG tablet Take 1,000 mg by mouth daily.   Yes [provider]  benzonatate (TESSALON) 200 MG capsule Take 1 capsule (200 mg total) by mouth 3 (three) times daily as needed. 10/04/23  Yes Nafziger, Kandee Keen, NP  Bioflavonoid Products (QUERCETIN COMPLEX IMMUNE PO) Take 500 mg by mouth daily.   Yes [provider]  Cholecalciferol (D3-50 PO) Take 1 capsule by mouth daily.   Yes [provider]  Evening Primrose Oil 1000 MG CAPS Take 1,000 mg by mouth in the morning and at bedtime.   Yes [provider]  ezetimibe (ZETIA) 10 MG tablet TAKE 1 TABLET BY MOUTH DAILY Patient taking differently: Take 10 mg by mouth at bedtime. 12/22/22  Yes Nafziger, Kandee Keen, NP  hydrochlorothiazide (HYDRODIURIL) 25 MG tablet TAKE 1 TABLET BY MOUTH DAILY 06/20/23  Yes Nafziger, Kandee Keen, NP  metFORMIN (GLUCOPHAGE) 500 MG tablet TAKE 2 TABLETS(1000 MG) BY MOUTH TWICE DAILY WITH A MEAL 10/04/23  Yes Nafziger, Kandee Keen, NP  simvastatin (ZOCOR) 20 MG tablet TAKE 1 TABLET(20 MG) BY MOUTH EVERY OTHER DAY 09/05/23  Yes Nafziger, Kandee Keen, NP  SYNTHROID 75 MCG tablet TAKE 1 TABLET BY MOUTH DAILY 12/22/22  Yes Nafziger, Kandee Keen, NP  vitamin B-12 (CYANOCOBALAMIN) 1000 MCG tablet Take 1,000 mcg by mouth 2 (two) times  a week.   Yes [provider]  Zinc 20 MG CAPS Take 20 mg by mouth daily.   Yes [provider]                                                                                                                                    Past Surgical History Past Surgical History:  Procedure Laterality Date   ABDOMINAL HYSTERECTOMY      BREAST SURGERY     CARPAL TUNNEL RELEASE Bilateral    CERVICAL DISC ARTHROPLASTY     CESAREAN SECTION     x2   left shoulder surgery     MENISCUS REPAIR Right 2017   REDUCTION MAMMAPLASTY Bilateral    right shoulder     TONSILLECTOMY     Family History Family History  Problem Relation Age of Onset   Melanoma Mother    Kidney disease Father    Diabetes Father    Heart disease Father    Breast cancer Paternal Grandmother    ADD / ADHD Other        multiple family members   Colon cancer Neg Hx    Esophageal cancer Neg Hx    Rectal cancer Neg Hx    Stomach cancer Neg Hx     Social History Social History   Tobacco Use   Smoking status: Never   Smokeless tobacco: Never  Vaping Use   Vaping status: Never Used  Substance Use Topics   Alcohol use: No   Drug use: No   Allergies Oxycodone-acetaminophen and Latex  Review of Systems Review of Systems  Unable to perform ROS: Patient nonverbal    Physical Exam Vital Signs  I have reviewed the triage vital signs BP 97/72   Pulse 70   Temp 99.1 F (37.3 C) (Axillary)   Resp 18   Ht 4\' 11"  (1.499 m)   Wt 49.6 kg   SpO2 98%   BMI 22.09 kg/m   Physical Exam Vitals and nursing note reviewed.  Constitutional:      General: She is not in acute distress.    Appearance: She is well-developed.  HENT:     Head: Normocephalic and atraumatic.  Eyes:     Conjunctiva/sclera: Conjunctivae normal.  Cardiovascular:     Rate and Rhythm: Normal rate and regular rhythm.     Heart sounds: No murmur heard. Pulmonary:     Effort: Pulmonary effort is normal. No respiratory distress.     Breath sounds: Normal breath sounds.  Abdominal:     Palpations: Abdomen is soft.     Tenderness: There is no abdominal tenderness.  Musculoskeletal:        General: No swelling.     Cervical back: Neck supple.  Skin:    General: Skin is warm and dry.     Capillary Refill: Capillary refill takes less than 2 seconds.  Neurological:  Mental Status: She is alert.     Cranial Nerves: Cranial nerve deficit present.     Motor: Weakness present.  Psychiatric:        Mood and Affect: Mood normal.     ED Results and Treatments Labs (all labs ordered are listed, but only abnormal results are displayed) Labs Reviewed  COMPREHENSIVE METABOLIC PANEL - Abnormal; Notable for the following components:      Result Value   Potassium 2.9 (*)    Chloride 93 (*)    Glucose, Bld 117 (*)    Total Protein 6.4 (*)    All other components within normal limits  RAPID URINE DRUG SCREEN, HOSP PERFORMED - Abnormal; Notable for the following components:   Amphetamines POSITIVE (*)    All other components within normal limits  GLUCOSE, CAPILLARY - Abnormal; Notable for the following components:   Glucose-Capillary 170 (*)    All other components within normal limits  BASIC METABOLIC PANEL - Abnormal; Notable for the following components:   Sodium 129 (*)    Chloride 93 (*)    Calcium 8.7 (*)    All other components within normal limits  GLUCOSE, CAPILLARY - Abnormal; Notable for the following components:   Glucose-Capillary 109 (*)    All other components within normal limits  I-STAT CHEM 8, ED - Abnormal; Notable for the following components:   Sodium 133 (*)    Potassium 2.8 (*)    Chloride 93 (*)    Glucose, Bld 110 (*)    Calcium, Ion 1.08 (*)    All other components within normal limits  CBG MONITORING, ED - Abnormal; Notable for the following components:   Glucose-Capillary 106 (*)    All other components within normal limits  MRSA NEXT GEN BY PCR, NASAL  ETHANOL  PROTIME-INR  APTT  CBC  DIFFERENTIAL  URINALYSIS, ROUTINE W REFLEX MICROSCOPIC  LIPID PANEL  GLUCOSE, CAPILLARY  MAGNESIUM  PHOSPHORUS  CBC                                                                                                                          Radiology ECHOCARDIOGRAM COMPLETE Result Date: 10/09/2023    ECHOCARDIOGRAM REPORT   Patient  Name:   Gabrielle Vargas Date of Exam: 10/09/2023 Medical Rec #:  [id number]            Height:       59.0 in Accession #:    [id number]           Weight:       109.3 lb Date of Birth:  Apr 20, 1951            BSA:          1.427 m Patient Age:    73 years             BP:           118/71 mmHg Patient Gender: F  HR:           73 bpm. Exam Location:  Inpatient Procedure: 2D Echo, Cardiac Doppler, Color Doppler and Intracardiac            Opacification Agent (Both Spectral and Color Flow Doppler were            utilized during procedure). Indications:    Stroke I63.9  History:        Patient has no prior history of Echocardiogram examinations.                 Risk Factors:Hypertension and Dyslipidemia.  Sonographer:    Harriette Bouillon RDCS Referring Phys: [id number] ERIC LINDZEN IMPRESSIONS  1. Left ventricular ejection fraction, by estimation, is 30 to 35%. The left ventricle has moderately decreased function. The left ventricle demonstrates regional wall motion abnormalities (see scoring diagram/findings for description). The left ventricular internal cavity size was mildly dilated. Left ventricular diastolic parameters are consistent with Grade I diastolic dysfunction (impaired relaxation).  2. Right ventricular systolic function is normal. The right ventricular size is normal. There is normal pulmonary artery systolic pressure.  3. The mitral valve is normal in structure. Mild mitral valve regurgitation. No evidence of mitral stenosis.  4. The aortic valve is tricuspid. There is mild calcification of the aortic valve. Aortic valve regurgitation is mild. No aortic stenosis is present.  5. The inferior vena cava is normal in size with greater than 50% respiratory variability, suggesting right atrial pressure of 3 mmHg. Conclusion(s)/Recommendation(s): No obvious LV thrombus with Definity contrast however given apical and mid to distal septal WMAs patient seems to be at high risk for cardio-embolic events.  FINDINGS  Left Ventricle: Left ventricular ejection fraction, by estimation, is 30 to 35%. The left ventricle has moderately decreased function. The left ventricle demonstrates regional wall motion abnormalities. The left ventricular internal cavity size was mildly dilated. There is no left ventricular hypertrophy. Left ventricular diastolic parameters are consistent with Grade I diastolic dysfunction (impaired relaxation).  LV Wall Scoring: The mid and distal anterior septum and mid inferoseptal segment are dyskinetic. The apical inferior segment is akinetic. The apical anterior segment and apex are hypokinetic. Right Ventricle: The right ventricular size is normal. No increase in right ventricular wall thickness. Right ventricular systolic function is normal. There is normal pulmonary artery systolic pressure. The tricuspid regurgitant velocity is 1.88 m/s, and  with an assumed right atrial pressure of 3 mmHg, the estimated right ventricular systolic pressure is 17.1 mmHg. Left Atrium: Left atrial size was normal in size. Right Atrium: Right atrial size was normal in size. Pericardium: There is no evidence of pericardial effusion. Mitral Valve: The mitral valve is normal in structure. Mild mitral valve regurgitation. No evidence of mitral valve stenosis. Tricuspid Valve: The tricuspid valve is normal in structure. Tricuspid valve regurgitation is trivial. No evidence of tricuspid stenosis. Aortic Valve: The aortic valve is tricuspid. There is mild calcification of the aortic valve. Aortic valve regurgitation is mild. No aortic stenosis is present. Pulmonic Valve: The pulmonic valve was normal in structure. Pulmonic valve regurgitation is not visualized. No evidence of pulmonic stenosis. Aorta: The aortic root is normal in size and structure. Venous: The inferior vena cava is normal in size with greater than 50% respiratory variability, suggesting right atrial pressure of 3 mmHg. IAS/Shunts: No atrial level shunt  detected by color flow Doppler.  LEFT VENTRICLE PLAX 2D LVIDd:         4.10 cm   Diastology LVIDs:  3.30 cm   LV e' medial:    5.11 cm/s LV PW:         0.60 cm   LV E/e' medial:  13.0 LV IVS:        0.70 cm   LV e' lateral:   7.83 cm/s LVOT diam:     1.80 cm   LV E/e' lateral: 8.5 LV SV:         51 LV SV Index:   36 LVOT Area:     2.54 cm  RIGHT VENTRICLE         IVC TAPSE (M-mode): 1.9 cm  IVC diam: 1.00 cm LEFT ATRIUM           Index       RIGHT ATRIUM          Index LA diam:      2.60 cm 1.82 cm/m  RA Area:     9.00 cm LA Vol (A4C): 12.4 ml 8.69 ml/m  RA Volume:   20.20 ml 14.16 ml/m  AORTIC VALVE             PULMONIC VALVE LVOT Vmax:   107.00 cm/s PR End Diast Vel: 0.14 msec LVOT Vmean:  79.600 cm/s LVOT VTI:    0.202 m  AORTA Ao Root diam: 3.00 cm MITRAL VALVE                TRICUSPID VALVE MV Area (PHT): 4.29 cm     TR Peak grad:   14.1 mmHg MV Decel Time: 177 msec     TR Vmax:        188.00 cm/s MV E velocity: 66.60 cm/s MV A velocity: 107.00 cm/s  SHUNTS MV E/A ratio:  0.62         Systemic VTI:  0.20 m                             Systemic Diam: 1.80 cm Arvilla Meres MD Electronically signed by Arvilla Meres MD Signature Date/Time: 10/09/2023/11:20:20 AM    Final    CT Head Wo Contrast Result Date: 10/08/2023 CLINICAL DATA:  Stroke-like symptoms EXAM: CT HEAD WITHOUT CONTRAST TECHNIQUE: Contiguous axial images were obtained from the base of the skull through the vertex without intravenous contrast. RADIATION DOSE REDUCTION: This exam was performed according to the departmental dose-optimization program which includes automated exposure control, adjustment of the mA and/or kV according to patient size and/or use of iterative reconstruction technique. COMPARISON:  09/10/2023 FINDINGS: Brain: No evidence of acute infarction, hemorrhage, hydrocephalus, extra-axial collection or mass lesion/mass effect. Mild atrophic changes are noted commensurate with the patient's given age. Vascular: No  hyperdense vessel or unexpected calcification. Skull: Normal. Negative for fracture or focal lesion. Sinuses/Orbits: No acute finding. Other: None. IMPRESSION: Chronic atrophic changes without acute abnormality. Electronically Signed   By: Alcide Clever M.D.   On: 10/08/2023 19:18   CT ANGIO HEAD NECK W WO CM (CODE STROKE) Result Date: 10/08/2023 CLINICAL DATA:  Code stroke EXAM: CT ANGIOGRAPHY HEAD AND NECK WITH AND WITHOUT CONTRAST TECHNIQUE: Multidetector CT imaging of the head and neck was performed using the standard protocol during bolus administration of intravenous contrast. Multiplanar CT image reconstructions and MIPs were obtained to evaluate the vascular anatomy. Carotid stenosis measurements (when applicable) are obtained utilizing NASCET criteria, using the distal internal carotid diameter as the denominator. RADIATION DOSE REDUCTION: This exam was performed according to the departmental dose-optimization program which  includes automated exposure control, adjustment of the mA and/or kV according to patient size and/or use of iterative reconstruction technique. CONTRAST:  75mL OMNIPAQUE IOHEXOL 350 MG/ML SOLN COMPARISON:  MR angio head 06/12/2022. FINDINGS: CTA NECK FINDINGS Aortic arch: A 3 vessel arch configuration is present. Minimal calcifications are present at the distal arch. No significant scratched at the great vessel origins are widely patent. No aneurysm or dissection is present. Right carotid system: The right common carotid artery is within normal limits. The bifurcation is unremarkable. Mild tortuosity is present in the distal cervical right ICA without focal stenosis. Left carotid system: Left common carotid artery is within normal limits. Minimal atherosclerotic changes present at the proximal left ICA without significant stenosis. Cervical left ICA is otherwise normal. Vertebral arteries: The right vertebral artery is dominant. Both vertebral arteries originate from the subclavian  arteries without significant stenosis. No significant stenosis is present in either vertebral artery in the neck. Skeleton: Cervical fusion is noted at C4-5 and C5-6. Slight degenerative anterolisthesis is present at C2-3 and C3-4. Other neck: The soft tissues of the neck are otherwise unremarkable. Salivary glands are within normal limits. Thyroid is normal. No significant adenopathy is present. No focal mucosal or submucosal lesions are present. Upper chest: The lung apices are clear. The thoracic inlet is within normal limits. 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 The internal carotid arteries are otherwise within normal limits from the skull base to the ICA termini. The A1 and M1 segments are normal. The anterior communicating artery is patent. The MCA bifurcations are normal bilaterally. There is some attenuation of distal MCA branch vessels bilaterally without a significant proximal stenosis or occlusion. No other aneurysm is present. Posterior circulation: The right vertebral artery is dominant. The PICA origins are visualized and normal. The left vertebral artery is centrally terminates at the PICA. The basilar artery is within normal limits. The superior cerebellar arteries are patent bilaterally. The right posterior cerebral artery Jeanette some basilar tip. Left posterior cerebral artery is of fetal type. The PCA branch vessels are normal bilaterally. Venous sinuses: The dural sinuses are patent. The straight sinus and deep cerebral veins are intact. Cortical veins are within normal limits. No significant vascular malformation is evident. Anatomic variants: Fetal type left posterior cerebral artery. Review of the MIP images confirms the above findings IMPRESSION: 1. 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 2. No significant  proximal stenosis, aneurysm, or branch vessel occlusion within the Circle of Willis. 3. Minimal atherosclerotic change at the proximal left ICA without significant stenosis. 4. Cervical fusion at C4-5 and C5-6. 5. Slight degenerative anterolisthesis at C2-3 and C3-4. 6.  Aortic Atherosclerosis (ICD10-I70.0). These results were called by telephone at the time of interpretation on 10/08/2023 at 5:33 pm to provider ERIC [facility] , who verbally acknowledged these results. Electronically Signed   By: Marin Roberts M.D.   On: 10/08/2023 17:46   CT HEAD CODE STROKE WO CONTRAST Result Date: 10/08/2023 CLINICAL DATA:  Code stroke. Neuro deficit, acute, stroke suspected. EXAM: CT HEAD WITHOUT CONTRAST TECHNIQUE: Contiguous axial images were obtained from the base of the skull through the vertex without intravenous contrast. RADIATION DOSE REDUCTION: This exam was performed according to the departmental dose-optimization program which includes automated exposure control, adjustment of the mA and/or kV according to patient size and/or use of iterative reconstruction technique.  COMPARISON:  MR head without contrast 06/12/2022. FINDINGS: Brain: No acute infarct, hemorrhage, or mass lesion is present. No significant white matter lesions are present. Deep brain nuclei are within normal limits. The ventricles are of normal size. No significant extraaxial fluid collection is present. The brainstem and cerebellum are within normal limits. Midline structures are within normal limits. Vascular: Atherosclerotic calcifications are present within the cavernous internal carotid arteries bilaterally. No hyperdense vessel is present. Skull: Calvarium is intact. No focal lytic or blastic lesions are present. No significant extracranial soft tissue lesion is present. Sinuses/Orbits: The paranasal sinuses and mastoid air cells are clear. The globes and orbits are within normal limits. ASPECTS [facility] Stroke Program Early CT Score) -  Ganglionic level infarction (caudate, lentiform nuclei, internal capsule, insula, M1-M3 cortex): 7/7 - Supraganglionic infarction (M4-M6 cortex): 3/3 Total score (0-10 with 10 being normal): 10/10 IMPRESSION: 1. Normal CT appearance of the brain. 2. Aspects is 10/10. These results were called by telephone at the time of interpretation on 10/08/2023 at 5:34 pm to provider Dr. Otelia Limes, who verbally acknowledged these results. Electronically Signed   By: Marin Roberts M.D.   On: 10/08/2023 17:35    Pertinent labs & imaging results that were available during my care of the patient were reviewed by me and considered in my medical decision making (see MDM for details).  Medications Ordered in ED Medications  0.9 %  sodium chloride infusion ( Intravenous Infusion Verify 10/09/23 1300)  acetaminophen (TYLENOL) tablet 650 mg (has no administration in time range)    Or  acetaminophen (TYLENOL) 160 MG/5ML solution 650 mg (has no administration in time range)    Or  acetaminophen (TYLENOL) suppository 650 mg (has no administration in time range)  senna-docusate (Senokot-S) tablet 1 tablet (has no administration in time range)  pantoprazole (PROTONIX) injection 40 mg (40 mg Intravenous Given 10/08/23 2109)  dexmedetomidine (PRECEDEX) 400 MCG/100ML (4 mcg/mL) infusion (0 mcg/kg/hr  49.6 kg Intravenous Hold 10/08/23 2113)  insulin aspart (novoLOG) injection 0-15 Units ( Subcutaneous Not Given 10/09/23 1151)  Chlorhexidine Gluconate Cloth 2 % PADS 6 each (6 each Topical Given 10/09/23 0924)  Oral care mouth rinse (15 mLs Mouth Rinse Given 10/09/23 1259)  Oral care mouth rinse (has no administration in time range)  levothyroxine (SYNTHROID) tablet 75 mcg (75 mcg Oral Not Given 10/09/23 0516)  cyanocobalamin (VITAMIN B12) tablet 1,000 mcg (1,000 mcg Oral Not Given 10/09/23 1027)  haloperidol lactate (HALDOL) injection 1 mg (1 mg Intravenous Given 10/09/23 1622)  perflutren lipid microspheres (DEFINITY) IV  suspension (2 mLs Intravenous Given 10/09/23 1028)  atorvastatin (LIPITOR) tablet 40 mg (has no administration in time range)  iohexol (OMNIPAQUE) 350 MG/ML injection 75 mL (75 mLs Intravenous Contrast Given 10/08/23 1726)  tenecteplase (TNKASE) injection for Stroke 12 mg (12 mg Intravenous Given 10/08/23 1810)  ondansetron (ZOFRAN) injection 4 mg (4 mg Intravenous Given 10/08/23 1755)  LORazepam (ATIVAN) injection 1 mg (1 mg Intravenous Given 10/08/23 1912)  ziprasidone (GEODON) injection 20 mg (20 mg Intramuscular Given 10/08/23 1937)  sterile water (preservative free) injection (1.2 mLs  Given 10/08/23 1937)   stroke: early stages of recovery book ( Does not apply Given 10/09/23 0924)  potassium chloride 10 mEq in 100 mL IVPB (0 mEq Intravenous Stopped 10/09/23 0336)  potassium chloride 10 mEq in 100 mL IVPB (10 mEq Intravenous New Bag/Given 10/09/23 1400)  Procedures .Critical Care  Performed by: Glendora Score, MD Authorized by: Glendora Score, MD   Critical care provider statement:    Critical care time (minutes):  30   Critical care was necessary to treat or prevent imminent or life-threatening deterioration of the following conditions:  CNS failure or compromise   Critical care was time spent personally by me on the following activities:  Development of treatment plan with patient or surrogate, discussions with consultants, evaluation of patient's response to treatment, examination of patient, ordering and review of laboratory studies, ordering and review of radiographic studies, ordering and performing treatments and interventions, pulse oximetry, re-evaluation of patient's condition and review of old charts   (including critical care time)  Medical Decision Making / ED Course   This patient presents to the ED for concern of facial droop, weakness, aphasia,  this involves an extensive number of treatment options, and is a complaint that carries with it a high risk of complications and morbidity.  The differential diagnosis includes CVA, hemorrhagic stroke, mass, Todd's paralysis, seizure, electrolyte abnormality, encephalopathy, complicated migraine  MDM: Patient seen emergency room for evaluation of facial droop, weakness and aphasia.  Physical exam with left upper extremity weakness, facial droop and aphasia.  Stroke alert initiated and patient taken immediately for CT scan.  Evaluated by stroke neurologist Dr. Otelia Limes.  CTA negative for LVO.  Laboratory evaluation with potassium 2.9, UDS positive for amphetamines but this is a home medication for this patient but is otherwise unremarkable.  Patient is a TNK candidate and Dr. Otelia Limes had discussion with family who ultimately did agree for TNK administration.  TNK administered.  Shortly after, patient had a change in her mental status with aggressive behavior and worsening weakness.  Taken back for CT imaging that was negative for hemorrhagic transformation.  Ultimately required Ativan and Geodon.  Started on Precedex and patient admitted to the stroke team.   Additional history obtained: -Additional history obtained from multiple family members -External records from outside source obtained and reviewed including: Chart review including previous notes, labs, imaging, consultation notes   Lab Tests: -I ordered, reviewed, and interpreted labs.   The pertinent results include:   Labs Reviewed  COMPREHENSIVE METABOLIC PANEL - Abnormal; Notable for the following components:      Result Value   Potassium 2.9 (*)    Chloride 93 (*)    Glucose, Bld 117 (*)    Total Protein 6.4 (*)    All other components within normal limits  RAPID URINE DRUG SCREEN, HOSP PERFORMED - Abnormal; Notable for the following components:   Amphetamines POSITIVE (*)    All other components within normal limits  GLUCOSE,  CAPILLARY - Abnormal; Notable for the following components:   Glucose-Capillary 170 (*)    All other components within normal limits  BASIC METABOLIC PANEL - Abnormal; Notable for the following components:   Sodium 129 (*)    Chloride 93 (*)    Calcium 8.7 (*)    All other components within normal limits  GLUCOSE, CAPILLARY - Abnormal; Notable for the following components:   Glucose-Capillary 109 (*)    All other components within normal limits  I-STAT CHEM 8, ED - Abnormal; Notable for the following components:   Sodium 133 (*)    Potassium 2.8 (*)    Chloride 93 (*)    Glucose, Bld 110 (*)    Calcium, Ion 1.08 (*)    All other components within normal limits  CBG MONITORING, ED - Abnormal;  Notable for the following components:   Glucose-Capillary 106 (*)    All other components within normal limits  MRSA NEXT GEN BY PCR, NASAL  ETHANOL  PROTIME-INR  APTT  CBC  DIFFERENTIAL  URINALYSIS, ROUTINE W REFLEX MICROSCOPIC  LIPID PANEL  GLUCOSE, CAPILLARY  MAGNESIUM  PHOSPHORUS  CBC      EKG   EKG Interpretation Date/Time:  Sunday October 08 2023 17:57:16 EDT Ventricular Rate:  68 PR Interval:  149 QRS Duration:  95 QT Interval:  417 QTC Calculation: 444 R Axis:   56  Text Interpretation: Sinus rhythm Confirmed by Raunak Antuna ([id number]) on 10/09/2023 4:29:57 PM         Imaging Studies ordered: I ordered imaging studies including CT angio head and neck, CT head x 2 I independently visualized and interpreted imaging. I agree with the radiologist interpretation   Medicines ordered and prescription drug management: Meds ordered this encounter  Medications   iohexol (OMNIPAQUE) 350 MG/ML injection 75 mL   tenecteplase (TNKASE) injection for Stroke 12 mg   ondansetron (ZOFRAN) injection 4 mg   LORazepam (ATIVAN) injection 1 mg   ziprasidone (GEODON) injection 20 mg   sterile water (preservative free) injection    Tim Lair, Alaina G: cabinet override    stroke: early  stages of recovery book   0.9 %  sodium chloride infusion   OR Linked Order Group    acetaminophen (TYLENOL) tablet 650 mg    acetaminophen (TYLENOL) 160 MG/5ML solution 650 mg    acetaminophen (TYLENOL) suppository 650 mg   senna-docusate (Senokot-S) tablet 1 tablet   pantoprazole (PROTONIX) injection 40 mg   DISCONTD: potassium chloride (KLOR-CON) packet 40 mEq   potassium chloride 10 mEq in 100 mL IVPB   DISCONTD: potassium chloride (KLOR-CON) packet 20 mEq   dexmedetomidine (PRECEDEX) 400 MCG/100ML (4 mcg/mL) infusion   insulin aspart (novoLOG) injection 0-15 Units    Correction coverage::   Moderate (average weight, post-op)    CBG < 70::   Implement Hypoglycemia Standing Orders and refer to Hypoglycemia Standing Orders sidebar report    CBG 70 - 120::   0 units    CBG 121 - 150::   2 units    CBG 151 - 200::   3 units    CBG 201 - 250::   5 units    CBG 251 - 300::   8 units    CBG 301 - 350::   11 units    CBG 351 - 400::   15 units    CBG > 400:   call MD and obtain STAT lab verification   Chlorhexidine Gluconate Cloth 2 % PADS 6 each   Oral care mouth rinse   Oral care mouth rinse   DISCONTD: simvastatin (ZOCOR) tablet 20 mg    TAKE 1 TABLET(20 MG) BY MOUTH EVERY OTHER DAY     levothyroxine (SYNTHROID) tablet 75 mcg   cyanocobalamin (VITAMIN B12) tablet 1,000 mcg   haloperidol lactate (HALDOL) injection 1 mg   potassium chloride 10 mEq in 100 mL IVPB   perflutren lipid microspheres (DEFINITY) IV suspension   atorvastatin (LIPITOR) tablet 40 mg    -I have reviewed the patients home medicines and have made adjustments as needed  Critical interventions Stroke alert, TNK administration  Consultations Obtained: I requested consultation with the stroke neurologist Dr. Otelia Limes,  and discussed lab and imaging findings as well as pertinent plan - they recommend: TNK and admission   Cardiac Monitoring: The patient was maintained  on a cardiac monitor.  I personally viewed  and interpreted the cardiac monitored which showed an underlying rhythm of: NSR  Social Determinants of Health:  Factors impacting patients care include: none   Reevaluation: After the interventions noted above, I reevaluated the patient and found that they have :stayed the same  Co morbidities that complicate the patient evaluation  Past Medical History:  Diagnosis Date   ADHD (attention deficit hyperactivity disorder) 05/09/2007   Diverticulitis of colon (without mention of hemorrhage) 09/17/2013   Essential hypertension 10/09/2017   History of COVID-19 2021   Hyperlipidemia    Hypothyroidism    Low back pain radiating to right leg 10/09/2017   Mild cognitive impairment of uncertain or unknown etiology 08/04/2022   Mild intermittent asthma with acute exacerbation 05/30/2017   Osteoarthritis    Postmenopausal atrophic vaginitis, severe 08/18/2008   Right knee pain 03/13/2014   S/P arthroscopy of right knee 06/03/2014   Vitamin B12 deficiency       Dispostion: I considered admission for this patient, and patient will require hospital admission for suspected stroke given TNK     Final Clinical Impression(s) / ED Diagnoses Final diagnoses:  None     @PCDICTATION @    Glendora Score, MD 10/09/23 1630

## 2023-10-09 NOTE — Progress Notes (Signed)
 EEG complete - results pending

## 2023-10-09 NOTE — Progress Notes (Addendum)
 STROKE TEAM PROGRESS NOTE    INTERIM HISTORY/SUBJECTIVE  Husband and daughter are at the bedside Patient is very lethargic and not following commands.  But she did get Geodon in the emergency room due to severe agitation She received TNK at around 1800 yesterday K is 2.9 will replace and check in the morning Will check EEG  24-hour brain imaging scheduled for 1800 tonight if negative for hemorrhage will start aspirin and Plavix  OBJECTIVE  CBC    Component Value Date/Time   WBC 5.8 10/08/2023 1710   RBC 4.67 10/08/2023 1710   HGB 12.9 10/08/2023 1717   HCT 38.0 10/08/2023 1717   PLT 177 10/08/2023 1710   MCV 84.8 10/08/2023 1710   MCH 28.7 10/08/2023 1710   MCHC 33.8 10/08/2023 1710   RDW 12.4 10/08/2023 1710   LYMPHSABS 2.6 10/08/2023 1710   MONOABS 0.5 10/08/2023 1710   EOSABS 0.0 10/08/2023 1710   BASOSABS 0.0 10/08/2023 1710    BMET    Component Value Date/Time   NA 133 (L) 10/08/2023 1717   K 2.8 (L) 10/08/2023 1717   CL 93 (L) 10/08/2023 1717   CO2 30 10/08/2023 1710   GLUCOSE 110 (H) 10/08/2023 1717   BUN 13 10/08/2023 1717   CREATININE 0.70 10/08/2023 1717   CREATININE 0.80 02/05/2020 0857   CALCIUM 9.2 10/08/2023 1710   GFRNONAA >60 10/08/2023 1710   GFRNONAA 74 02/05/2020 0857    IMAGING past 24 hours CT Head Wo Contrast Result Date: 10/08/2023 CLINICAL DATA:  Stroke-like symptoms EXAM: CT HEAD WITHOUT CONTRAST TECHNIQUE: Contiguous axial images were obtained from the base of the skull through the vertex without intravenous contrast. RADIATION DOSE REDUCTION: This exam was performed according to the departmental dose-optimization program which includes automated exposure control, adjustment of the mA and/or kV according to patient size and/or use of iterative reconstruction technique. COMPARISON:  09/10/2023 FINDINGS: Brain: No evidence of acute infarction, hemorrhage, hydrocephalus, extra-axial collection or mass lesion/mass effect. Mild atrophic changes  are noted commensurate with the patient's given age. Vascular: No hyperdense vessel or unexpected calcification. Skull: Normal. Negative for fracture or focal lesion. Sinuses/Orbits: No acute finding. Other: None. IMPRESSION: Chronic atrophic changes without acute abnormality. Electronically Signed   By: Alcide Clever M.D.   On: 10/08/2023 19:18   CT ANGIO HEAD NECK W WO CM (CODE STROKE) Result Date: 10/08/2023 CLINICAL DATA:  Code stroke EXAM: CT ANGIOGRAPHY HEAD AND NECK WITH AND WITHOUT CONTRAST TECHNIQUE: Multidetector CT imaging of the head and neck was performed using the standard protocol during bolus administration of intravenous contrast. Multiplanar CT image reconstructions and MIPs were obtained to evaluate the vascular anatomy. Carotid stenosis measurements (when applicable) are obtained utilizing NASCET criteria, using the distal internal carotid diameter as the denominator. RADIATION DOSE REDUCTION: This exam was performed according to the departmental dose-optimization program which includes automated exposure control, adjustment of the mA and/or kV according to patient size and/or use of iterative reconstruction technique. CONTRAST:  75mL OMNIPAQUE IOHEXOL 350 MG/ML SOLN COMPARISON:  MR angio head 06/12/2022. FINDINGS: CTA NECK FINDINGS Aortic arch: A 3 vessel arch configuration is present. Minimal calcifications are present at the distal arch. No significant scratched at the great vessel origins are widely patent. No aneurysm or dissection is present. Right carotid system: The right common carotid artery is within normal limits. The bifurcation is unremarkable. Mild tortuosity is present in the distal cervical right ICA without focal stenosis. Left carotid system: Left common carotid artery is within normal limits.  Minimal atherosclerotic changes present at the proximal left ICA without significant stenosis. Cervical left ICA is otherwise normal. Vertebral arteries: The right vertebral artery is  dominant. Both vertebral arteries originate from the subclavian arteries without significant stenosis. No significant stenosis is present in either vertebral artery in the neck. Skeleton: Cervical fusion is noted at C4-5 and C5-6. Slight degenerative anterolisthesis is present at C2-3 and C3-4. Other neck: The soft tissues of the neck are otherwise unremarkable. Salivary glands are within normal limits. Thyroid is normal. No significant adenopathy is present. No focal mucosal or submucosal lesions are present. Upper chest: The lung apices are clear. The thoracic inlet is within normal limits. 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 The internal carotid arteries are otherwise within normal limits from the skull base to the ICA termini. The A1 and M1 segments are normal. The anterior communicating artery is patent. The MCA bifurcations are normal bilaterally. There is some attenuation of distal MCA branch vessels bilaterally without a significant proximal stenosis or occlusion. No other aneurysm is present. Posterior circulation: The right vertebral artery is dominant. The PICA origins are visualized and normal. The left vertebral artery is centrally terminates at the PICA. The basilar artery is within normal limits. The superior cerebellar arteries are patent bilaterally. The right posterior cerebral artery Jeanette some basilar tip. Left posterior cerebral artery is of fetal type. The PCA branch vessels are normal bilaterally. Venous sinuses: The dural sinuses are patent. The straight sinus and deep cerebral veins are intact. Cortical veins are within normal limits. No significant vascular malformation is evident. Anatomic variants: Fetal type left posterior cerebral artery. Review of the MIP images confirms the above findings IMPRESSION: 1. 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 2. No significant proximal stenosis, aneurysm, or branch vessel occlusion within the Circle of Willis. 3. Minimal atherosclerotic change at the proximal left ICA without significant stenosis. 4. Cervical fusion at C4-5 and C5-6. 5. Slight degenerative anterolisthesis at C2-3 and C3-4. 6.  Aortic Atherosclerosis (ICD10-I70.0). These results were called by telephone at the time of interpretation on 10/08/2023 at 5:33 pm to provider ERIC [facility] , who verbally acknowledged these results. Electronically Signed   By: Marin Roberts M.D.   On: 10/08/2023 17:46   CT HEAD CODE STROKE WO CONTRAST Result Date: 10/08/2023 CLINICAL DATA:  Code stroke. Neuro deficit, acute, stroke suspected. EXAM: CT HEAD WITHOUT CONTRAST TECHNIQUE: Contiguous axial images were obtained from the base of the skull through the vertex without intravenous contrast. RADIATION DOSE REDUCTION: This exam was performed according to the departmental dose-optimization program which includes automated exposure control, adjustment of the mA and/or kV according to patient size and/or use of iterative reconstruction technique. COMPARISON:  MR head without contrast 06/12/2022. FINDINGS: Brain: No acute infarct, hemorrhage, or mass lesion is present. No significant white matter lesions are present. Deep brain nuclei are within normal limits. The ventricles are of normal size. No significant extraaxial fluid collection is present. The brainstem and cerebellum are within normal limits. Midline structures are within normal limits. Vascular: Atherosclerotic calcifications are present within the cavernous internal carotid arteries bilaterally. No hyperdense vessel is present. Skull: Calvarium is intact. No focal lytic or blastic lesions are present. No significant extracranial soft tissue lesion is present. Sinuses/Orbits: The paranasal sinuses and mastoid air cells are clear. The globes and orbits are within  normal  limits. ASPECTS [facility] Stroke Program Early CT Score) - Ganglionic level infarction (caudate, lentiform nuclei, internal capsule, insula, M1-M3 cortex): 7/7 - Supraganglionic infarction (M4-M6 cortex): 3/3 Total score (0-10 with 10 being normal): 10/10 IMPRESSION: 1. Normal CT appearance of the brain. 2. Aspects is 10/10. These results were called by telephone at the time of interpretation on 10/08/2023 at 5:34 pm to provider Dr. Otelia Limes, who verbally acknowledged these results. Electronically Signed   By: Marin Roberts M.D.   On: 10/08/2023 17:35    Vitals:   10/09/23 0400 10/09/23 0500 10/09/23 0600 10/09/23 0700  BP: 117/65 104/66 116/75 117/82  Pulse: 76 80 84 89  Resp:      Temp: 98.9 F (37.2 C)     TempSrc: Axillary     SpO2: 100% 91% 98% 93%  Weight:      Height:         PHYSICAL EXAM General: Critically ill Psych: Unable to assess CV: Regular rate and rhythm on monitor Respiratory:  Regular, unlabored respirations on room air GI: Abdomen soft and nontender   NEURO:  Mental Status: She is lethargic, not following commands, she does move all 4 extremities appears to be equal with stimulation  Cranial Nerves:  II: PERRL. Visual fields f no blink to threat bilaterally III, IV, VI: EOMI. Eyelids elevate symmetrically.  V: Sensation is intact to light touch and symmetrical to face.  VII: Left facial droop VIII: hearing intact to voice. IX, X: Palate elevates symmetrically. Phonation is normal.  ZO:[id number] shrug 5/5. XII: tongue is midline without fasciculations. Motor: Does appear to be moving all extremities equally Tone: is normal and bulk is normal Sensation-sponsor noxious stimuli Coordination: Unable to assess Gait- deferred  Most Recent NIH 24   ASSESSMENT/PLAN  Ms. KATHY WAHID is a 73 y.o. female with history of ADHD, HTN, diverticulitis, HLD, hypothyroidism, low back pain, cervical neck fusion, mild cognitive impairment, low back pain  radiating to right leg, mild intermittent asthma, osteoarthritis, right knee pain and vitamin B12 deficiency who presents to the ED from home  admitted for acute onset of left sided weakness and dysphasia.  Received IV TNK at 1800 on 3/23.  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 ASPECTS 10.    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 No significant proximal stenosis, aneurysm, or branch vessel occlusion within the Circle of Willis. Minimal atherosclerotic change at the proximal left ICA without significant stenosis. Aortic atherosclerosis  24-hour CT head MRI ordered 2D Echo EF 30 to 35%.  LV with grade 1 diastolic dysfunction LDL 80 HgbA1c 6.2 VTE prophylaxis -SCDs No antithrombotic prior to admission, now on No antithrombotic until 24-hour brain imaging with no hemorrhage then will start aspirin 81 mg and Plavix 75 mg Therapy recommendations:  Pending Disposition: Pending  Hypertension CHF Home meds: HCTZ 25 mg Stable Blood Pressure Goal: BP less than 180/105  Will need cards  consult   Hyperlipidemia Home meds: Simvastatin 20 mg, Zetia 10 mg,  resumed in hospital LDL 80, goal < 70 Change simvastatin to atorvastatin 40 mg Continue statin at discharge  Diabetes melitis controlled  A1c 6.2 On metformin at home SSI Close follow-up with primary care  dysphagia Patient has post-stroke dysphagia, SLP consulted    Diet   Diet NPO time specified   Advance diet as tolerated  Hypothyroidism  Continue home levothyroxine 75 mcg  Other Stroke Risk Factors Congestive heart failure   Other Active Problems ADHD on Adderall B12 deficiency Mild cognitive impairment  Hospital day # 1  Gevena Mart DNP, ACNPC-AG  [facility]  I have personally obtained history,examined this patient, reviewed notes, independently viewed  imaging studies, participated in medical decision making and plan of care.ROS completed by me personally and pertinent positives fully documented  I have made any additions or clarifications directly to the above note. Agree with note above.  73 year old lady presented with sudden onset of speech difficulties and left hemiparesis with NIH stroke scale of 8.  She received IV TNK.  She got agitated last night and received Geodon for sedation and neurological exam is limited likely from that.  She is not speaking and following commands consistently but is moving all 4 extremities against gravity equally.  Recommend continue close neurological observation and strict blood pressure control as per post TNK protocol.  Check MRI scan of the brain later today and she may need some sedation for that.  Check EEG for seizures.  Continue ongoing stroke workup.  Long discussion with patient's husband and family at the bedside and answered questions about the plan of care This patient is critically ill and at significant risk of neurological worsening, death and care requires constant monitoring of vital signs, hemodynamics,respiratory and cardiac monitoring, extensive review of multiple databases, frequent neurological assessment, discussion with family, other specialists and medical decision making of high complexity.I have made any additions or clarifications directly to the above note.This critical care time does not reflect procedure time, or teaching time or supervisory time of PA/NP/Med Resident etc but could involve care discussion time.  I spent 30 minutes of neurocritical care time  in the care of  this patient.     Delia Heady, MD Medical Director [facility] Pager: [phone] 10/09/2023 3:58 PM   To contact Stroke Continuity provider, please refer to [email or web address]. After hours, contact General Neurology

## 2023-10-09 NOTE — Progress Notes (Signed)
  Echocardiogram 2D Echocardiogram has been performed.  Leda Roys RDCS 10/09/2023, 10:27 AM

## 2023-10-09 NOTE — Progress Notes (Signed)
 SLP Cancellation Note  Patient Details Name: Gabrielle Vargas MRN: [id number] DOB: July 09, 1951   Cancelled treatment:       Reason Eval/Treat Not Completed: Patient at procedure or test/unavailable. Pt in a procedure with staff and very restless. Will f/u.    Mariam Helbert, Riley Nearing 10/09/2023, 11:21 AM

## 2023-10-09 NOTE — Procedures (Signed)
 Patient Name: GWENDOLYNE WELFORD  MRN: [id number]  Epilepsy Attending: Charlsie Quest  Referring Physician/Provider: Mathews Argyle, NP  Date: 10/09/2023 Duration: 22.07 mins  Patient history: 73 year old lady presented with sudden onset of speech difficulties and left hemiparesis. EEG to evaluate for seizure  Level of alertness: comatose/ lethargic   AEDs during EEG study: None  Technical aspects: This EEG study was done with scalp electrodes positioned according to the 10-20 International system of electrode placement. Electrical activity was reviewed with band pass filter of 1-70Hz , sensitivity of 7 uV/mm, display speed of 82mm/sec with a 60Hz  notched filter applied as appropriate. EEG data were recorded continuously and digitally stored.  Video monitoring was available and reviewed as appropriate.  Description: EEG showed continuous generalized polymorphic 3 to 6 Hz theta-delta slowing. Hyperventilation and photic stimulation were not performed.     ABNORMALITY - Continuous slow, generalized  IMPRESSION: This study is suggestive of moderate diffuse encephalopathy. No seizures or epileptiform discharges were seen throughout the recording.  Kytzia Gienger Annabelle Harman

## 2023-10-09 NOTE — TOC CM/SW Note (Signed)
 Transition of Care [facility]) - Inpatient Brief Assessment   Patient Details  Name: Gabrielle Vargas MRN: [id number] Date of Birth: 1951-03-08  Transition of [facility] CM/SW Contact:    Glennon Mac, RN Phone Number: 10/09/2023, 4:39 PM   Clinical Narrative: Patient admitted on 10/08/23 s/p acute ischemic infarct of Rt MCA s/p TNK. Currently undergoing stroke workup.   TOC Team will continue to follow as patient progresses; please consult as needs arise.    Transition of Care Asessment: Insurance and Status: Insurance coverage has been reviewed Patient has primary care physician: Yes Shirline Frees, NP) Home environment has been reviewed: From home with spouse Prior level of function:: Independent Prior/Current Home Services: No current home services Social Drivers of Health Review: SDOH reviewed no interventions necessary Readmission risk has been reviewed: Yes Transition of care needs: transition of care needs identified, TOC will continue to follow  Quintella Baton, RN, BSN  Trauma/Neuro ICU Case Manager [phone]

## 2023-10-09 NOTE — Progress Notes (Signed)
 OT Cancellation Note  Patient Details Name: Gabrielle Vargas MRN: [id number] DOB: 05/03/1951   Cancelled Treatment:    Reason Eval/Treat Not Completed: Active bedrest order. Pt is currently on strict bedrest, Therapy team will be in communication with medical team (RN and MD) to determine appropriateness for therapy evaluation.   Evern Bio [name] 10/09/2023, 7:53 AM  [name] OTR/L Acute Rehabilitation Services Office: [phone]

## 2023-10-10 DIAGNOSIS — I509 Heart failure, unspecified: Secondary | ICD-10-CM

## 2023-10-10 DIAGNOSIS — R29702 NIHSS score 2: Secondary | ICD-10-CM

## 2023-10-10 DIAGNOSIS — I5020 Unspecified systolic (congestive) heart failure: Secondary | ICD-10-CM | POA: Diagnosis not present

## 2023-10-10 DIAGNOSIS — E785 Hyperlipidemia, unspecified: Secondary | ICD-10-CM | POA: Diagnosis not present

## 2023-10-10 DIAGNOSIS — I110 Hypertensive heart disease with heart failure: Secondary | ICD-10-CM

## 2023-10-10 DIAGNOSIS — I63411 Cerebral infarction due to embolism of right middle cerebral artery: Secondary | ICD-10-CM | POA: Diagnosis not present

## 2023-10-10 DIAGNOSIS — I69391 Dysphagia following cerebral infarction: Secondary | ICD-10-CM | POA: Diagnosis not present

## 2023-10-10 DIAGNOSIS — E119 Type 2 diabetes mellitus without complications: Secondary | ICD-10-CM | POA: Diagnosis not present

## 2023-10-10 HISTORY — DX: Unspecified systolic (congestive) heart failure: I50.20

## 2023-10-10 LAB — BASIC METABOLIC PANEL
Anion gap: 14 (ref 5–15)
BUN: 13 mg/dL (ref 8–23)
CO2: 20 mmol/L — ABNORMAL LOW (ref 22–32)
Calcium: 8.9 mg/dL (ref 8.9–10.3)
Chloride: 100 mmol/L (ref 98–111)
Creatinine, Ser: 0.63 mg/dL (ref 0.44–1.00)
GFR, Estimated: >60 mL/min (ref >60)
Glucose, Bld: 97 mg/dL (ref 70–99)
Potassium: 4 mmol/L (ref 3.5–5.1)
Sodium: 134 mmol/L — ABNORMAL LOW (ref 135–145)

## 2023-10-10 LAB — CBC
HCT: 42.6 % (ref 36.0–46.0)
Hemoglobin: 14.4 g/dL (ref 12.0–15.0)
MCH: 28.1 pg (ref 26.0–34.0)
MCHC: 33.8 g/dL (ref 30.0–36.0)
MCV: 83.2 fL (ref 80.0–100.0)
Platelets: 147 10*3/uL — ABNORMAL LOW (ref 150–400)
RBC: 5.12 MIL/uL — ABNORMAL HIGH (ref 3.87–5.11)
RDW: 12.5 % (ref 11.5–15.5)
WBC: 5.6 10*3/uL (ref 4.0–10.5)
nRBC: 0 % (ref 0.0–0.2)

## 2023-10-10 LAB — GLUCOSE, CAPILLARY
Glucose-Capillary: 86 mg/dL (ref 70–99)
Glucose-Capillary: 96 mg/dL (ref 70–99)
Glucose-Capillary: 74 mg/dL (ref 70–99)
Glucose-Capillary: 136 mg/dL — ABNORMAL HIGH (ref 70–99)

## 2023-10-10 MED ORDER — CLOPIDOGREL BISULFATE 75 MG PO TABS
75.0000 mg | ORAL_TABLET | Freq: Every day | ORAL | Status: DC
Start: 2023-10-10 — End: 2023-10-12
  Administered 2023-10-10 – 2023-10-12 (×3): 75 mg via ORAL
  Filled 2023-10-10 (×3): qty 1

## 2023-10-10 MED ORDER — ASPIRIN 81 MG PO CHEW
81.0000 mg | CHEWABLE_TABLET | Freq: Every day | ORAL | Status: DC
  Administered 2023-10-10 – 2023-10-12 (×3): 81 mg via ORAL
  Filled 2023-10-10 (×3): qty 1

## 2023-10-10 MED ORDER — ORAL CARE MOUTH RINSE: 15.0000 mL | OROMUCOSAL | Status: DC | PRN

## 2023-10-10 MED ORDER — ENOXAPARIN SODIUM 40 MG/0.4ML IJ SOSY
40.0000 mg | PREFILLED_SYRINGE | INTRAMUSCULAR | Status: DC
  Administered 2023-10-10 – 2023-10-12 (×3): 40 mg via SUBCUTANEOUS
  Filled 2023-10-10 (×3): qty 0.4

## 2023-10-10 MED ORDER — ASPIRIN 300 MG RE SUPP
300.0000 mg | Freq: Every day | RECTAL | Status: DC
  Filled 2023-10-10: qty 1

## 2023-10-10 NOTE — Evaluation (Addendum)
 Physical Therapy Evaluation Patient Details Name: Gabrielle Vargas MRN: [id number] DOB: 06-02-1951 Today's Date: 10/10/2023  History of Present Illness  73 y/o F presents to [facility] on 3/23 with CC of acute onset of L sided weakness, L facial droop, aphasia, L side visual cut, and dysphasia. 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 PMH includes: ADHD, HTN, diverticulitis, HLD, hypothyroidism, low back pain, cervical neck fusion, mild cognitive impairment, low back pain radiating to right leg, mild intermittent asthma, osteoarthritis, right knee pain and vitamin B12 deficiency.  Clinical Impression  Prior to recent admission for the above diagnosis, patient is independent for ADLs/IDLs -- ambulates 4-57mi daily and lives in a house with her husband. Patient currently presents with some cognitive impairments, inattention/neglect, motor apraxia L > R, and A&O to self and location. Patient demonstrates some inconsistencies with command following, and requires ModA +2 for bed mobility. Patient is able to perform sit > stand transfer and ambulate 17ft around the unit with HHA +2 to ensure safety. Patient will benefit from intensive inpatient follow-up therapy, >3 hours/day to maximize her functional independence. Acute PT will continue to follow-up to improve her tolerance to functional tasks. Thank you for this consult.     If plan is discharge home, recommend the following: A little help with walking and/or transfers;A little help with bathing/dressing/bathroom;Assistance with cooking/housework;Direct supervision/assist for medications management;Direct supervision/assist for financial management;Assist for transportation;Help with stairs or ramp for entrance;Supervision due to cognitive status   Can travel by private vehicle         Equipment Recommendations None recommended by PT  Recommendations for Other Services       Functional Status Assessment Patient has had a recent decline in their functional status and demonstrates the ability to make significant improvements in function in a reasonable and predictable amount of time.     Precautions / Restrictions Precautions Precautions: Fall Recall of Precautions/Restrictions: Intact Restrictions Weight Bearing Restrictions Per Provider Order: No      Mobility  Bed Mobility Overal bed mobility: Needs Assistance Bed Mobility: Supine to Sit     Supine to sit: Mod assist, +2 for physical assistance     General bed mobility comments: ModA +2 to transition hips and  legs EOB. Patient inconsistent with command following    Transfers Overall transfer level: Needs assistance Equipment used: 2 person hand held assist Transfers: Sit to/from Stand Sit to Stand: Contact guard assist           General transfer comment: CGA+2 to ensure safety with sit > stand. Patient uses LE support to power up from a seated position    Ambulation/Gait Ambulation/Gait assistance: Contact guard assist, +2 safety/equipment Gait Distance (Feet): 150 Feet Assistive device: 2 person hand held assist Gait Pattern/deviations: Decreased stride length Gait velocity: Decreased     General Gait Details: CGA +2; Cues to increase cadence, slow processing when dual-tasking  Stairs            Wheelchair Mobility     Tilt Bed    Modified Rankin (Stroke Patients Only) Modified Rankin (Stroke Patients Only) Pre-Morbid Rankin Score: No symptoms Modified Rankin: Moderately severe disability     Balance Overall balance assessment: Needs assistance Sitting-balance support: Feet supported Sitting balance-Leahy Scale: Fair Sitting balance - Comments: Requires some cues to maintain upright sitting posture Postural control: Posterior lean, Left  lateral lean Standing balance  support: Bilateral upper extremity supported Standing balance-Leahy Scale: Poor Standing balance comment: HHA +2 to ensure safety                             Pertinent Vitals/Pain Pain Assessment Pain Assessment: No/denies pain    Home Living Family/patient expects to be discharged to:: Private residence Living Arrangements: Spouse/significant other Available Help at Discharge: Family Type of Home: House Home Access: Stairs to enter Entrance Stairs-Rails: None Entrance Stairs-Number of Steps: 2   Home Layout: Multi-level;Able to live on main level with bedroom/bathroom Home Equipment: None      Prior Function Prior Level of Function : Independent/Modified Independent;Driving             Mobility Comments: no DME, walks 4-6 miles daily ADLs Comments: independent, does IADL     Extremity/Trunk Assessment   Upper Extremity Assessment Upper Extremity Assessment: Defer to OT evaluation LUE Deficits / Details: generally 4/5 strength    Lower Extremity Assessment Lower Extremity Assessment: LLE deficits/detail LLE Deficits / Details: L-sided inattention/neglect with inconsistent command follows. Pt demonstrates some motor apraxia (i.e marches and LAQs)    Cervical / Trunk Assessment Cervical / Trunk Assessment: Normal  Communication   Communication Communication: No apparent difficulties    Cognition Arousal: Alert Behavior During Therapy: Flat affect                             Following commands: Impaired Following commands impaired: Follows one step commands inconsistently     Cueing Cueing Techniques: Verbal cues, Gestural cues, Tactile cues, Visual cues     General Comments General comments (skin integrity, edema, etc.): VSS    Exercises     Assessment/Plan    PT Assessment Patient needs continued PT services  PT Problem List Decreased activity tolerance;Decreased balance;Decreased mobility;Decreased coordination;Decreased  cognition;Decreased safety awareness;Decreased knowledge of precautions       PT Treatment Interventions Gait training;Stair training;Functional mobility training;Therapeutic activities;Therapeutic exercise;Balance training;Neuromuscular re-education;Cognitive remediation;Patient/family education    PT Goals (Current goals can be found in the Care Plan section)  Acute Rehab PT Goals Patient Stated Goal: Family wants her to return to PLOF PT Goal Formulation: With family Time For Goal Achievement: 10/24/23 Potential to Achieve Goals: Good    Frequency Min 3X/week     Co-evaluation PT/OT/SLP Co-Evaluation/Treatment: Yes Reason for Co-Treatment: Necessary to address cognition/behavior during functional activity;To address functional/ADL transfers PT goals addressed during session: Mobility/safety with mobility;Balance;Strengthening/ROM OT goals addressed during session: ADL's and self-care;Strengthening/ROM;Other (comment) (vision)       AM-PAC PT "6 Clicks" Mobility  Outcome Measure Help needed turning from your back to your side while in a flat bed without using bedrails?: A Lot Help needed moving from lying on your back to sitting on the side of a flat bed without using bedrails?: A Lot Help needed moving to and from a bed to a chair (including a wheelchair)?: A Little Help needed standing up from a chair using your arms (e.g., wheelchair or bedside chair)?: A Little Help needed to walk in hospital room?: A Little Help needed climbing 3-5 steps with a railing? : A Lot 6 Click Score: 15    End of Session Equipment Utilized During Treatment: Gait belt Activity Tolerance: Patient tolerated treatment well;Patient limited by lethargy Patient left: in chair;with call bell/phone within reach;with chair alarm set;with family/visitor present Nurse Communication: Mobility  status PT Visit Diagnosis: Unsteadiness on feet (R26.81);History of falling (Z91.81);Apraxia (R48.2);Other symptoms  and signs involving the nervous system (R29.898)    Time: 1610-9604 PT Time Calculation (min) (ACUTE ONLY): 47 min   Charges:   PT Evaluation $PT Eval Moderate Complexity: 1 Mod PT Treatments $Therapeutic Activity: 8-22 mins PT General Charges $$ ACUTE PT VISIT: 1 Visit         Doreen Beam, SPT   Genola Yuille 10/10/2023, 1:55 PM

## 2023-10-10 NOTE — Progress Notes (Signed)
 Inpatient Rehab Admissions Coordinator Note:   Per therapy recommendations patient was screened for CIR candidacy by Stephania Fragmin, PT. At this time, pt appears to be a potential candidate for CIR. I will place an order for rehab consult for full assessment, per our protocol.  Please contact me any with questions.Estill Dooms, PT, DPT [phone] 10/10/23 2:13 PM

## 2023-10-10 NOTE — Progress Notes (Signed)
 SLP Cancellation Note  Patient Details Name: Gabrielle Vargas MRN: [id number] DOB: 12-08-1950   Cancelled treatment:       Reason Eval/Treat Not Completed: Fatigue/lethargy limiting ability to participate. Pt resting upon SLP arrival and family asking that she not be disturbed, as she had already had a lot of activity today. Will f/u on subsequent date.     [name]., M.A. CCC-SLP Acute Rehabilitation Services Office [phone]  Secure chat preferred  10/10/2023, 2:49 PM

## 2023-10-10 NOTE — Progress Notes (Addendum)
 STROKE TEAM PROGRESS NOTE    Received TNK 3/23 1800  INTERIM HISTORY/SUBJECTIVE Patient is doing much better.  She is awake alert interactive and cooperative.  MRI scan shows bilateral small embolic infarcts in anterior and posterior circulation.  Echocardiogram showed decreased ejection fraction of 3035%.  Left atrial size is normal. Husband and son are at the bedside Patient is sitting up in bed, improved from yesterday. Will transfer out of ICU.    EEG negative  24-hour brain imaging shows multiple punctate acute infarcts bilateral frontal and left parietal.   OBJECTIVE  CBC    Component Value Date/Time   WBC 5.2 10/09/2023 1149   RBC 4.89 10/09/2023 1149   HGB 14.0 10/09/2023 1149   HCT 39.7 10/09/2023 1149   PLT 167 10/09/2023 1149   MCV 81.2 10/09/2023 1149   MCH 28.6 10/09/2023 1149   MCHC 35.3 10/09/2023 1149   RDW 12.1 10/09/2023 1149   LYMPHSABS 2.6 10/08/2023 1710   MONOABS 0.5 10/08/2023 1710   EOSABS 0.0 10/08/2023 1710   BASOSABS 0.0 10/08/2023 1710    BMET    Component Value Date/Time   NA 129 (L) 10/09/2023 0937   K 3.5 10/09/2023 0937   CL 93 (L) 10/09/2023 0937   CO2 22 10/09/2023 0937   GLUCOSE 98 10/09/2023 0937   BUN 9 10/09/2023 0937   CREATININE 0.56 10/09/2023 0937   CREATININE 0.80 02/05/2020 0857   CALCIUM 8.7 (L) 10/09/2023 0937   GFRNONAA >60 10/09/2023 0937   GFRNONAA 74 02/05/2020 0857    IMAGING past 24 hours MR BRAIN WO CONTRAST Result Date: 10/09/2023 CLINICAL DATA:  Stroke, follow up EXAM: MRI HEAD WITHOUT CONTRAST TECHNIQUE: Multiplanar, multiecho pulse sequences of the brain and surrounding structures were obtained without intravenous contrast. COMPARISON:  CT head October 08, 2023. MRI head November 26, 23. FINDINGS: Significantly motion limited study. Brain: Multiple punctate foci of restricted diffusion in bilateral frontal lobes and the left parietal lobe. No evidence of acute hemorrhage, mass lesion, midline shift or  hydrocephalus. Cerebral atrophy. Vascular: Major arterial flow voids are maintained at the skull base. Skull and upper cervical spine: Normal marrow signal. Sinuses/Orbits: Mostly clear sinuses. IMPRESSION: 1. Findings compatible with multiple punctate acute infarcts in bilateral frontal and left parietal lobes. Given involvement of multiple vascular territories, consider an embolic etiology. 2. Significantly motion limited study. Electronically Signed   By: Feliberto Harts M.D.   On: 10/09/2023 22:53   EEG adult Result Date: 10/09/2023 Charlsie Quest, MD     10/09/2023  4:41 PM [name] MRN: [id number] Epilepsy Attending: Charlsie Quest Referring Physician/Provider: Mathews Argyle, NP Date: 10/09/2023 Duration: 22.07 mins Patient history: 73 year old lady presented with sudden onset of speech difficulties and left hemiparesis. EEG to evaluate for seizure Level of alertness: comatose/ lethargic AEDs during EEG study: None Technical aspects: This EEG study was done with scalp electrodes positioned according to the 10-20 International system of electrode placement. Electrical activity was reviewed with band pass filter of 1-70Hz , sensitivity of 7 uV/mm, display speed of 36mm/sec with a 60Hz  notched filter applied as appropriate. EEG data were recorded continuously and digitally stored.  Video monitoring was available and reviewed as appropriate. Description: EEG showed continuous generalized polymorphic 3 to 6 Hz theta-delta slowing. Hyperventilation and photic stimulation were not performed.   ABNORMALITY - Continuous slow, generalized IMPRESSION: This study is suggestive of moderate diffuse encephalopathy. No seizures or epileptiform discharges were seen throughout the recording. Priyanka Annabelle Harman  ECHOCARDIOGRAM COMPLETE Result Date: 10/09/2023    ECHOCARDIOGRAM REPORT   Patient Name:   ONI DIETZMAN Date of Exam: 10/09/2023 Medical Rec #:  [id number]            Height:        59.0 in Accession #:    [id number]           Weight:       109.3 lb Date of Birth:  [birth year]            BSA:          1.427 m Patient Age:    73 years             BP:           118/71 mmHg Patient Gender: F                    HR:           73 bpm. Exam Location:  Inpatient Procedure: 2D Echo, Cardiac Doppler, Color Doppler and Intracardiac            Opacification Agent (Both Spectral and Color Flow Doppler were            utilized during procedure). Indications:    Stroke I63.9  History:        Patient has no prior history of Echocardiogram examinations.                 Risk Factors:Hypertension and Dyslipidemia.  Sonographer:    Harriette Bouillon RDCS Referring Phys: [phone] ERIC LINDZEN IMPRESSIONS  1. Left ventricular ejection fraction, by estimation, is 30 to 35%. The left ventricle has moderately decreased function. The left ventricle demonstrates regional wall motion abnormalities (see scoring diagram/findings for description). The left ventricular internal cavity size was mildly dilated. Left ventricular diastolic parameters are consistent with Grade I diastolic dysfunction (impaired relaxation).  2. Right ventricular systolic function is normal. The right ventricular size is normal. There is normal pulmonary artery systolic pressure.  3. The mitral valve is normal in structure. Mild mitral valve regurgitation. No evidence of mitral stenosis.  4. The aortic valve is tricuspid. There is mild calcification of the aortic valve. Aortic valve regurgitation is mild. No aortic stenosis is present.  5. The inferior vena cava is normal in size with greater than 50% respiratory variability, suggesting right atrial pressure of 3 mmHg. Conclusion(s)/Recommendation(s): No obvious LV thrombus with Definity contrast however given apical and mid to distal septal WMAs patient seems to be at high risk for cardio-embolic events. FINDINGS  Left Ventricle: Left ventricular ejection fraction, by estimation, is 30 to 35%. The left  ventricle has moderately decreased function. The left ventricle demonstrates regional wall motion abnormalities. The left ventricular internal cavity size was mildly dilated. There is no left ventricular hypertrophy. Left ventricular diastolic parameters are consistent with Grade I diastolic dysfunction (impaired relaxation).  LV Wall Scoring: The mid and distal anterior septum and mid inferoseptal segment are dyskinetic. The apical inferior segment is akinetic. The apical anterior segment and apex are hypokinetic. Right Ventricle: The right ventricular size is normal. No increase in right ventricular wall thickness. Right ventricular systolic function is normal. There is normal pulmonary artery systolic pressure. The tricuspid regurgitant velocity is 1.88 m/s, and  with an assumed right atrial pressure of 3 mmHg, the estimated right ventricular systolic pressure is 17.1 mmHg. Left Atrium: Left atrial size was normal in size. Right Atrium: Right atrial size was normal  in size. Pericardium: There is no evidence of pericardial effusion. Mitral Valve: The mitral valve is normal in structure. Mild mitral valve regurgitation. No evidence of mitral valve stenosis. Tricuspid Valve: The tricuspid valve is normal in structure. Tricuspid valve regurgitation is trivial. No evidence of tricuspid stenosis. Aortic Valve: The aortic valve is tricuspid. There is mild calcification of the aortic valve. Aortic valve regurgitation is mild. No aortic stenosis is present. Pulmonic Valve: The pulmonic valve was normal in structure. Pulmonic valve regurgitation is not visualized. No evidence of pulmonic stenosis. Aorta: The aortic root is normal in size and structure. Venous: The inferior vena cava is normal in size with greater than 50% respiratory variability, suggesting right atrial pressure of 3 mmHg. IAS/Shunts: No atrial level shunt detected by color flow Doppler.  LEFT VENTRICLE PLAX 2D LVIDd:         4.10 cm   Diastology LVIDs:          3.30 cm   LV e' medial:    5.11 cm/s LV PW:         0.60 cm   LV E/e' medial:  13.0 LV IVS:        0.70 cm   LV e' lateral:   7.83 cm/s LVOT diam:     1.80 cm   LV E/e' lateral: 8.5 LV SV:         51 LV SV Index:   36 LVOT Area:     2.54 cm  RIGHT VENTRICLE         IVC TAPSE (M-mode): 1.9 cm  IVC diam: 1.00 cm LEFT ATRIUM           Index       RIGHT ATRIUM          Index LA diam:      2.60 cm 1.82 cm/m  RA Area:     9.00 cm LA Vol (A4C): 12.4 ml 8.69 ml/m  RA Volume:   20.20 ml 14.16 ml/m  AORTIC VALVE             PULMONIC VALVE LVOT Vmax:   107.00 cm/s PR End Diast Vel: 0.14 msec LVOT Vmean:  79.600 cm/s LVOT VTI:    0.202 m  AORTA Ao Root diam: 3.00 cm MITRAL VALVE                TRICUSPID VALVE MV Area (PHT): 4.29 cm     TR Peak grad:   14.1 mmHg MV Decel Time: 177 msec     TR Vmax:        188.00 cm/s MV E velocity: 66.60 cm/s MV A velocity: 107.00 cm/s  SHUNTS MV E/A ratio:  0.62         Systemic VTI:  0.20 m                             Systemic Diam: 1.80 cm Arvilla Meres MD Electronically signed by Arvilla Meres MD Signature Date/Time: 10/09/2023/11:20:20 AM    Final     Vitals:   10/10/23 0400 10/10/23 0500 10/10/23 0600 10/10/23 0700  BP:  105/68 116/73 120/67  Pulse: 63 60    Resp: 19 17 18 16   Temp: 97.8 F (36.6 C)     TempSrc: Oral     SpO2: 98% 97%    Weight:      Height:         PHYSICAL EXAM General: Critically ill Psych:  Unable to assess CV: Regular rate and rhythm on monitor Respiratory:  Regular, unlabored respirations on room air GI: Abdomen soft and nontender   NEURO:  Mental Status: She is drowsy, but responsive.  She is oriented to self and to place.  She follows simple commands. Speech/language: No dysarthria or aphasia noted.  Patient is quite drowsy and does have some slow responses  Cranial Nerves:  II: PERRL. Decreased left visual field.  III, IV, VI: EOMI. Eyelids elevate symmetrically.  V: Sensation is intact to light touch and symmetrical  to face.  VII: Left facial droop VIII: hearing intact to voice. IX, X: Palate elevates symmetrically. Phonation is normal.  ZO:[id number] shrug 5/5. XII: tongue is midline without fasciculations. Motor:  Generalized weakness with no drift.  No focal deficits Tone: is normal and bulk is normal Sensation- no deficits seen. Coordination: Unable to assess Gait- deferred  Most Recent NIH 2  ASSESSMENT/PLAN  Ms. ALYNAH SCHONE is a 73 y.o. female with history of ADHD, HTN, diverticulitis, HLD, hypothyroidism, low back pain, cervical neck fusion, mild cognitive impairment, low back pain radiating to right leg, mild intermittent asthma, osteoarthritis, right knee pain and vitamin B12 deficiency who presents to the ED from home  admitted for acute onset of left sided weakness and dysphasia.  Received IV TNK at 1800 on 3/23.  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 ASPECTS 10.    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 No significant proximal stenosis, aneurysm, or branch vessel occlusion within the Circle of Willis. Minimal atherosclerotic change at the proximal left ICA without significant stenosis. Aortic atherosclerosis  24-hour CT head: Chronic atrophy changes, no acute abnormality, no hemorrhage. MRI: Multiple punctate acute infarcts bilateral frontal and left parietal lobes Routine EEG: Suggestive of moderate diffuse encephalopathy. No seizures or epileptiform discharges were seen throughout the recording  2D Echo EF 30 to 35%.  LV with grade 1 diastolic dysfunction Will likely need heart monitor on discharge LDL 80 HgbA1c 6.2 VTE prophylaxis -SCDs No antithrombotic prior to admission, now on aspirin 81 mg and Plavix 75 mg for 3 weeks, then aspirin alone. Unless alternate anticoagulation perferred  by cardiology, pending their consult.  Therapy recommendations:  CIR Disposition: Pending  Hypertension CHF Home meds: HCTZ 25 mg Stable Blood Pressure Goal: BP less than 180/105  2D Echo EF 30 to 35% Cardiology consult ordered.   Hyperlipidemia Home meds: Simvastatin 20 mg, Zetia 10 mg,  resumed in hospital LDL 80, goal < 70 Change simvastatin to atorvastatin 40 mg Continue statin at discharge  Diabetes melitis controlled  A1c 6.2 On metformin at home SSI Close follow-up with primary care  Hypothyroidism Continue home levothyroxine 75 mcg  Other Stroke Risk Factors Congestive heart failure  Other Active Problems ADHD on Adderall B12 deficiency Mild cognitive impairment  Hospital day # 2  Ok to transfer out of ICU, order placed.    Pt seen by Neuro NP/APP and later by MD. Note/plan to be edited by MD as needed.    Lynnae January, DNP, AGACNP-BC [facility] Please use AMION for contact information & EPIC for messaging.  I have personally obtained history,examined this patient, reviewed notes, independently viewed imaging studies, participated in medical decision making and plan of care.ROS completed by me personally and pertinent positives fully documented  I have made  any additions or clarifications directly to the above note. Agree with note above.  Patient presented with sudden onset of left facial droop and speech difficulty and was treated with IV TNK and is doing very well and has shown significant improvement.  MRI however shows bilateral embolic infarcts likely from central cardiac source.  Recommend continue ongoing telemetry monitoring but will likely need loop recorder at discharge and this cardiology feels 30-day monitor will be adequate.  Recommend cardiology consult for her new onset heart failure and diminished ejection fraction.  Aspirin and Plavix for 3 weeks followed by aspirin alone and aggressive risk factor modification.  Mobilize out of  bed.  Therapy consults.  Transfer out of ICU later today when bed available.  Long discussion patient and multiple family members at the bedside and answered questions. Greater than 50% time during this 50-minute visit was spent in counseling and coordination of care and discussion patient and family and care team and answering questions  Delia Heady, MD Medical Director [facility] Pager: [phone] 10/10/2023 9:35 PM   To contact Stroke Continuity provider, please refer to [email or web address]. After hours, contact General Neurology

## 2023-10-10 NOTE — Plan of Care (Addendum)
  Problem: Acute Rehab PT Goals(only PT should resolve) Goal: Pt Will Go Supine/Side To Sit Flowsheets (Taken 10/10/2023 1352) Pt will go Supine/Side to Sit: with minimal assist Goal: Patient Will Transfer Sit To/From Stand Flowsheets (Taken 10/10/2023 1352) Patient will transfer sit to/from stand: with supervision Goal: Pt Will Ambulate Flowsheets (Taken 10/10/2023 1352) Pt will Ambulate:  > 125 feet  with contact guard assist  with least restrictive assistive device Goal: Pt Will Go Up/Down Stairs Flowsheets (Taken 10/10/2023 1352) Pt will Go Up / Down Stairs:  3-5 stairs  with contact guard assist Goal: Pt Will Verbalize and Adhere to Precautions While Description: PT Will Verbalize and Adhere to Precautions While Performing Mobility Flowsheets (Taken 10/10/2023 1352) Pt will verbalize and adhere to precautions while performing mobility: (Fall) --

## 2023-10-10 NOTE — Progress Notes (Signed)
 OT Evaluation:  Clinical Impression: Pt is typically independent in ADL and mobility. Drives, retired [employer] operational officer. Enjoys walking 4-6 miles daily with husband. Today Pt presents with deficits in strength, balance, cognition (attention, safety awareness, initiation, motor planning), displaying inconsistent inattention to L side and L side weakness. Visual deficits present in L field - cognition impacting assessment. Pt eager and willing to work with therapy. Overall Pt mod A for UB and LB ADL, min A for transfers and +2 safety for mobility with HHA (Pt actively reaching for external assist). Pt will benefit from skilled OT in the acute setting as well as afterwards at >3 hours daily therapy setting to maximize safety and independence in ADL and functional transfers. Pt's family present throughout session and very supportive - they are able to provide 24/7 support.     10/10/23 1219  OT Visit Information  Assistance Needed +1  Reason for Co-Treatment Necessary to address cognition/behavior during functional activity;To address functional/ADL transfers  PT goals addressed during session Mobility/safety with mobility;Balance;Strengthening/ROM  History of Present Illness 73 y/o F presents to [facility] on 3/23 with CC of acute onset of L sided weakness, L facial droop, aphasia, L side visual cut, and dysphasia. 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 PMH includes: ADHD, HTN, diverticulitis, HLD, hypothyroidism, low back pain, cervical neck fusion, mild cognitive impairment, low back pain radiating to right leg, mild intermittent asthma, osteoarthritis, right knee pain and vitamin B12 deficiency.  Precautions  Precautions Fall  Recall of Precautions/Restrictions Intact  Restrictions  Weight Bearing Restrictions Per Provider Order No   Home Living  Family/patient expects to be discharged to: Private residence  Living Arrangements Spouse/significant other  Available Help at Discharge Family  Type of Home House  Home Access Stairs to enter  Entrance Stairs-Number of Steps 2  Entrance Stairs-Rails None  Home Layout Multi-level;Able to live on main level with bedroom/bathroom  Catering manager  (Comfort)  Home Equipment None  Prior Function  Prior Level of Function  Independent/Modified Independent;Driving  Mobility Comments no DME, walks 4-6 miles daily  ADLs Comments independent, does IADL  Pain Assessment  Pain Assessment No/denies pain  Cognition  Arousal Alert  Behavior During Therapy Flat affect  Following Commands  Following commands Impaired  Following commands impaired Follows one step commands inconsistently  Cueing  Cueing Techniques Verbal cues;Gestural cues;Tactile cues;Visual cues  Communication  Communication No apparent difficulties  Upper Extremity Assessment  Upper Extremity Assessment Defer to OT evaluation  Lower Extremity Assessment  Lower Extremity Assessment LLE deficits/detail  LLE Deficits / Details L-sided inattention/neglect with inconsistent command follows. Pt demonstrates some motor apraxia (i.e marches and LAQs)  Cervical / Trunk Assessment  Cervical / Trunk Assessment Normal  Bed Mobility  Overal bed mobility Needs Assistance  Bed Mobility Supine to Sit  Supine to sit Mod assist;+2 for physical assistance  General bed mobility comments ModA +2 to transition hips and  legs EOB. Patient inconsistent with command following  Transfers  Overall transfer level Needs assistance  Equipment used 2 person hand held assist  Transfers Sit to/from Stand  Sit to Stand Contact guard assist  General transfer comment CGA+2 to ensure safety with sit > stand. Patient uses LE support to power up from a seated position  Balance  Overall balance assessment  Needs assistance  Sitting-balance  support Feet supported  Sitting balance-Leahy Scale Fair  Sitting balance - Comments Requires some cues to maintain upright sitting posture  Postural control Posterior lean;Left lateral lean  Standing balance support Bilateral upper extremity supported  Standing balance-Leahy Scale Poor  Standing balance comment HHA +2 to ensure safety  General Comments  General comments (skin integrity, edema, etc.) VSS  Progressive Mobility  What is the highest level of mobility based on the progressive mobility assessment? Level 5 (Walks with assist in room/hall) - Balance while stepping forward/back and can walk in room with assist - Complete  Activity Ambulated with assistance in hallway;Ambulated with assistance in room;Stood at bedside;Repositioned in chair;Dangled on edge of bed   [name] OTR/L Acute Rehabilitation Services Office: [phone]

## 2023-10-10 NOTE — Progress Notes (Signed)
   10/10/23 0930  Spiritual Encounters  Type of Visit Initial  Care provided to: Pt and family  Conversation partners present during encounter Nurse  Referral source Patient request  Reason for visit Routine spiritual support  OnCall Visit No  Spiritual Framework  Presenting Themes Coping tools;Community and relationships;Values and beliefs;Impactful experiences and emotions  Values/beliefs Christianity  Community/Connection Family;Friend(s);Faith community;Significant other  Strengths Lots of family/community support  Patient Stress Factors Major life changes;Health changes;Loss of control  Family Stress Factors Loss of control;Major life changes;Family relationships  Interventions  Spiritual Care Interventions Made Established relationship of care and support;Compassionate presence;Reflective listening;Explored values/beliefs/practices/strengths;Prayer;Encouragement  Intervention Outcomes  Outcomes Reduced fear;Awareness of support;Connection to spiritual care;Patient family open to resources    Chaplain responded to spiritual consult request for prayer. Husband Gabrielle Vargas and son Gabrielle Vargas present at bedside. Family states they have a lot of support from church community and others - many people praying for pt. They also report pt "doing much better this morning than she was even last night." Pt understood who I was and requested prayer.

## 2023-10-10 NOTE — Consult Note (Addendum)
 Cardiology Consultation   Patient ID: Gabrielle Vargas MRN: [id number]; DOB: [birth year]  Admit date: 10/08/2023 Date of Consult: 10/10/2023  PCP:  Shirline Frees, NP   [facility] Providers Cardiologist:  None   Patient Profile:   Gabrielle Vargas is a 73 y.o. female with a hx of hypertension, hyperlipidemia, prediabetes, hypothyroidism, ADHD who is being seen 10/10/2023 for the evaluation of new HFrEF at the request of neurology.  History of Present Illness:   Ms. Lintner is accompanied today with her daughter and husband.  Patient was sleeping during my interview.  They report no history of prior cardiac history or family history.  Does not smoke, drink, do drugs.   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  MRI showing multiple acute infarcts in the bilateral frontal/left parietal lobes.  Started on aspirin and Plavix.  Initially presented with new onset left-sided weakness and dysphagia.  She has newly reduced EF 30 to 35% with wall motion abnormalities in the apical and mid to distal distal septal walls concerning for cardioembolic origin.  Cardiology consulted to further manage this.  Patient today still very lethargic not alert for interview.  Husband reports that she was very functional at home and they would often walk 5 to 6 miles a day with no symptoms of chest pain, palpitations, peripheral edema, orthopnea, dizziness.  However husband does report that they had been noticing she was a little bit more fatigued recently for the last few weeks.  Husband is actually a patient of Dr. Anne Fu.  Potassium 4.0.  Creatinine 0.63.  Hemoglobin 14.4.  A1c 6.2.   Past Medical History:  Diagnosis Date   ADHD (attention deficit hyperactivity disorder) 05/09/2007   Diverticulitis of colon (without mention of hemorrhage) 09/17/2013   Essential hypertension 10/09/2017   History of COVID-19 2021   Hyperlipidemia     Hypothyroidism    Low back pain radiating to right leg 10/09/2017   Mild cognitive impairment of uncertain or unknown etiology 08/04/2022   Mild intermittent asthma with acute exacerbation 05/30/2017   Osteoarthritis    Postmenopausal atrophic vaginitis, severe 08/18/2008   Right knee pain 03/13/2014   S/P arthroscopy of right knee 06/03/2014   Vitamin B12 deficiency     Past Surgical History:  Procedure Laterality Date   ABDOMINAL HYSTERECTOMY     BREAST SURGERY     CARPAL TUNNEL RELEASE Bilateral    CERVICAL DISC ARTHROPLASTY     CESAREAN SECTION     x2   left shoulder surgery     MENISCUS REPAIR Right 2017   REDUCTION MAMMAPLASTY Bilateral    right shoulder     TONSILLECTOMY      Inpatient Medications: Scheduled Meds:  aspirin  81 mg Oral Daily   Or   aspirin  300 mg Rectal Daily   atorvastatin  40 mg Oral Daily   Chlorhexidine Gluconate Cloth  6 each Topical Daily   clopidogrel  75 mg Oral Daily   cyanocobalamin  1,000 mcg Oral Once per day on Monday Thursday   enoxaparin (LOVENOX) injection  40 mg Subcutaneous Q24H   insulin aspart  0-15 Units Subcutaneous TID WC   levothyroxine  75 mcg Oral Daily   Continuous Infusions:  PRN Meds: acetaminophen **OR** acetaminophen (TYLENOL) oral liquid 160 mg/5 mL **OR** acetaminophen, mouth rinse, senna-docusate  Allergies:    Allergies  Allergen Reactions   Oxycodone-Acetaminophen Nausea Only   Latex Hives and Rash  Social History:   Social History   Socioeconomic History   Marital status: Married    Spouse name: Not on file   Number of children: 2   Years of education: 13   Highest education level: Some college, no degree  Occupational History   Occupation: Retired  Tobacco Use   Smoking status: Never   Smokeless tobacco: Never  Vaping Use   Vaping status: Never Used  Substance and Sexual Activity   Alcohol use: No   Drug use: No   Sexual activity: Not on file  Other Topics Concern   Not on file   Social History Narrative   Retired   Married   2 children   Right handed   Live with husband   Social Drivers of Corporate investment banker Strain: Low Risk  (12/23/2022)   Overall Financial Resource Strain (CARDIA)    Difficulty of Paying Living Expenses: Not hard at all  Food Insecurity: Patient Unable To Answer (10/09/2023)   Hunger Vital Sign    Worried About Running Out of Food in the Last Year: Patient unable to answer    Ran Out of Food in the Last Year: Patient unable to answer  Transportation Needs: Patient Unable To Answer (10/09/2023)   PRAPARE - Transportation    Lack of Transportation (Medical): Patient unable to answer    Lack of Transportation (Non-Medical): Patient unable to answer  Physical Activity: Sufficiently Active (12/23/2022)   Exercise Vital Sign    Days of Exercise per Week: 7 days    Minutes of Exercise per Session: 60 min  Stress: No Stress Concern Present (12/23/2022)   [employer] of Occupational Health - Occupational Stress Questionnaire    Feeling of Stress : Not at all  Social Connections: Patient Unable To Answer (10/09/2023)   Social Connection and Isolation Panel [NHANES]    Frequency of Communication with Friends and Family: Patient unable to answer    Frequency of Social Gatherings with Friends and Family: Patient unable to answer    Attends Religious Services: Patient unable to answer    Active Member of Clubs or Organizations: Patient unable to answer    Attends Banker Meetings: Patient unable to answer    Marital Status: Patient unable to answer  Intimate Partner Violence: Patient Unable To Answer (10/09/2023)   Humiliation, Afraid, Rape, and Kick questionnaire    Fear of Current or Ex-Partner: Patient unable to answer    Emotionally Abused: Patient unable to answer    Physically Abused: Patient unable to answer    Sexually Abused: Patient unable to answer    Family History:   Family History  Problem Relation Age of  Onset   Melanoma Mother    Kidney disease Father    Diabetes Father    Heart disease Father    Breast cancer Paternal Grandmother    ADD / ADHD Other        multiple family members   Colon cancer Neg Hx    Esophageal cancer Neg Hx    Rectal cancer Neg Hx    Stomach cancer Neg Hx      ROS:  Please see the history of present illness.  All other ROS reviewed and negative.     Physical Exam/Data:   Vitals:   10/10/23 1100 10/10/23 1144 10/10/23 1300 10/10/23 1410  BP: 128/81  116/74 116/74  Pulse:   77   Resp:   20   Temp:  98 F (36.7 C)  TempSrc:  Axillary    SpO2:   96%   Weight:      Height:        Intake/Output Summary (Last 24 hours) at 10/10/2023 1441 Last data filed at 10/10/2023 0600 Gross per 24 hour  Intake 506.09 ml  Output 450 ml  Net 56.09 ml      10/08/2023    5:00 PM 10/04/2023    2:07 PM 09/05/2023    8:30 AM  Last 3 Weights  Weight (lbs) 109 lb 5.6 oz 110 lb 111 lb  Weight (kg) 49.6 kg 49.896 kg 50.349 kg     Body mass index is 22.09 kg/m.  General: Sleeping HEENT: normal Neck: Mild JVD Vascular: No carotid bruits; Distal pulses 2+ bilaterally Cardiac:  normal S1, S2; RRR; no murmur. Lungs:  clear to auscultation bilaterally, no wheezing, rhonchi or rales  Abd: soft, nontender, no hepatomegaly  Ext: no edema Musculoskeletal:  No deformities, BUE and BLE strength normal and equal Skin: warm and dry  Neuro:  CNs 2-12 intact, no focal abnormalities noted Psych:  Normal affect   EKG:  The EKG was personally reviewed and demonstrates: Sinus rhythm heart rate 68.  Sloped ST segment.  But no acute ST-T wave changes.  No significant changes from prior. Telemetry:  Telemetry was personally reviewed and demonstrates: Heart rates 60-70  Relevant CV Studies: Echocardiogram 10/09/2023 1. Left ventricular ejection fraction, by estimation, is 30 to 35%. The  left ventricle has moderately decreased function. The left ventricle  demonstrates regional  wall motion abnormalities (see scoring  diagram/findings for description). The left  ventricular internal cavity size was mildly dilated. Left ventricular  diastolic parameters are consistent with Grade I diastolic dysfunction  (impaired relaxation).   2. Right ventricular systolic function is normal. The right ventricular  size is normal. There is normal pulmonary artery systolic pressure.   3. The mitral valve is normal in structure. Mild mitral valve  regurgitation. No evidence of mitral stenosis.   4. The aortic valve is tricuspid. There is mild calcification of the  aortic valve. Aortic valve regurgitation is mild. No aortic stenosis is  present.   5. The inferior vena cava is normal in size with greater than 50%  respiratory variability, suggesting right atrial pressure of 3 mmHg.   Conclusion(s)/Recommendation(s): No obvious LV thrombus with Definity  contrast however given apical and mid to distal septal WMAs patient seems  to be at high risk for cardio-embolic events.    Laboratory Data:  High Sensitivity Troponin:  No results for input(s): "TROPONINIHS" in the last 720 hours.   Chemistry Recent Labs  Lab 10/08/23 1710 10/08/23 1717 10/09/23 0937 10/10/23 0911  NA 135 133* 129* 134*  K 2.9* 2.8* 3.5 4.0  CL 93* 93* 93* 100  CO2 30  --  22 20*  GLUCOSE 117* 110* 98 97  BUN 12 13 9 13   CREATININE 0.70 0.70 0.56 0.63  CALCIUM 9.2  --  8.7* 8.9  MG  --   --  1.7  --   GFRNONAA >60  --  >60 >60  ANIONGAP 12  --  14 14    Recent Labs  Lab 10/08/23 1710  PROT 6.4*  ALBUMIN 3.6  AST 24  ALT 17  ALKPHOS 73  BILITOT 0.5   Lipids  Recent Labs  Lab 10/09/23 0712  CHOL 102  TRIG 87  HDL 52  LDLCALC 33  CHOLHDL 2.0    Hematology Recent Labs  Lab 10/08/23  1710 10/08/23 1717 10/09/23 1149 10/10/23 0911  WBC 5.8  --  5.2 5.6  RBC 4.67  --  4.89 5.12*  HGB 13.4 12.9 14.0 14.4  HCT 39.6 38.0 39.7 42.6  MCV 84.8  --  81.2 83.2  MCH 28.7  --  28.6 28.1   MCHC 33.8  --  35.3 33.8  RDW 12.4  --  12.1 12.5  PLT 177  --  167 147*   Thyroid No results for input(s): "TSH", "FREET4" in the last 168 hours.  BNPNo results for input(s): "BNP", "PROBNP" in the last 168 hours.  DDimer No results for input(s): "DDIMER" in the last 168 hours.   Radiology/Studies:  MR BRAIN WO CONTRAST Result Date: 10/09/2023 CLINICAL DATA:  Stroke, follow up EXAM: MRI HEAD WITHOUT CONTRAST TECHNIQUE: Multiplanar, multiecho pulse sequences of the brain and surrounding structures were obtained without intravenous contrast. COMPARISON:  CT head October 08, 2023. MRI head November 26, 23. FINDINGS: Significantly motion limited study. Brain: Multiple punctate foci of restricted diffusion in bilateral frontal lobes and the left parietal lobe. No evidence of acute hemorrhage, mass lesion, midline shift or hydrocephalus. Cerebral atrophy. Vascular: Major arterial flow voids are maintained at the skull base. Skull and upper cervical spine: Normal marrow signal. Sinuses/Orbits: Mostly clear sinuses. IMPRESSION: 1. Findings compatible with multiple punctate acute infarcts in bilateral frontal and left parietal lobes. Given involvement of multiple vascular territories, consider an embolic etiology. 2. Significantly motion limited study. Electronically Signed   By: Feliberto Harts M.D.   On: 10/09/2023 22:53   EEG adult Result Date: 10/09/2023 Charlsie Quest, MD     10/09/2023  4:41 PM [name] MRN: [id number] Epilepsy Attending: Charlsie Quest Referring Physician/Provider: Mathews Argyle, NP Date: 10/09/2023 Duration: 22.07 mins Patient history: 73 year old lady presented with sudden onset of speech difficulties and left hemiparesis. EEG to evaluate for seizure Level of alertness: comatose/ lethargic AEDs during EEG study: None Technical aspects: This EEG study was done with scalp electrodes positioned according to the 10-20 International system of electrode  placement. Electrical activity was reviewed with band pass filter of 1-70Hz , sensitivity of 7 uV/mm, display speed of 69mm/sec with a 60Hz  notched filter applied as appropriate. EEG data were recorded continuously and digitally stored.  Video monitoring was available and reviewed as appropriate. Description: EEG showed continuous generalized polymorphic 3 to 6 Hz theta-delta slowing. Hyperventilation and photic stimulation were not performed.   ABNORMALITY - Continuous slow, generalized IMPRESSION: This study is suggestive of moderate diffuse encephalopathy. No seizures or epileptiform discharges were seen throughout the recording. Charlsie Quest   ECHOCARDIOGRAM COMPLETE Result Date: 10/09/2023    ECHOCARDIOGRAM REPORT   Patient Name:   KENISE BARRACO Date of Exam: 10/09/2023 Medical Rec #:  [id number]            Height:       59.0 in Accession #:    [id number]           Weight:       109.3 lb Date of Birth:  1951/03/22            BSA:          1.427 m Patient Age:    73 years             BP:           118/71 mmHg Patient Gender: F  HR:           73 bpm. Exam Location:  Inpatient Procedure: 2D Echo, Cardiac Doppler, Color Doppler and Intracardiac            Opacification Agent (Both Spectral and Color Flow Doppler were            utilized during procedure). Indications:    Stroke I63.9  History:        Patient has no prior history of Echocardiogram examinations.                 Risk Factors:Hypertension and Dyslipidemia.  Sonographer:    Harriette Bouillon RDCS Referring Phys: [phone] ERIC LINDZEN IMPRESSIONS  1. Left ventricular ejection fraction, by estimation, is 30 to 35%. The left ventricle has moderately decreased function. The left ventricle demonstrates regional wall motion abnormalities (see scoring diagram/findings for description). The left ventricular internal cavity size was mildly dilated. Left ventricular diastolic parameters are consistent with Grade I diastolic dysfunction  (impaired relaxation).  2. Right ventricular systolic function is normal. The right ventricular size is normal. There is normal pulmonary artery systolic pressure.  3. The mitral valve is normal in structure. Mild mitral valve regurgitation. No evidence of mitral stenosis.  4. The aortic valve is tricuspid. There is mild calcification of the aortic valve. Aortic valve regurgitation is mild. No aortic stenosis is present.  5. The inferior vena cava is normal in size with greater than 50% respiratory variability, suggesting right atrial pressure of 3 mmHg. Conclusion(s)/Recommendation(s): No obvious LV thrombus with Definity contrast however given apical and mid to distal septal WMAs patient seems to be at high risk for cardio-embolic events. FINDINGS  Left Ventricle: Left ventricular ejection fraction, by estimation, is 30 to 35%. The left ventricle has moderately decreased function. The left ventricle demonstrates regional wall motion abnormalities. The left ventricular internal cavity size was mildly dilated. There is no left ventricular hypertrophy. Left ventricular diastolic parameters are consistent with Grade I diastolic dysfunction (impaired relaxation).  LV Wall Scoring: The mid and distal anterior septum and mid inferoseptal segment are dyskinetic. The apical inferior segment is akinetic. The apical anterior segment and apex are hypokinetic. Right Ventricle: The right ventricular size is normal. No increase in right ventricular wall thickness. Right ventricular systolic function is normal. There is normal pulmonary artery systolic pressure. The tricuspid regurgitant velocity is 1.88 m/s, and  with an assumed right atrial pressure of 3 mmHg, the estimated right ventricular systolic pressure is 17.1 mmHg. Left Atrium: Left atrial size was normal in size. Right Atrium: Right atrial size was normal in size. Pericardium: There is no evidence of pericardial effusion. Mitral Valve: The mitral valve is normal in  structure. Mild mitral valve regurgitation. No evidence of mitral valve stenosis. Tricuspid Valve: The tricuspid valve is normal in structure. Tricuspid valve regurgitation is trivial. No evidence of tricuspid stenosis. Aortic Valve: The aortic valve is tricuspid. There is mild calcification of the aortic valve. Aortic valve regurgitation is mild. No aortic stenosis is present. Pulmonic Valve: The pulmonic valve was normal in structure. Pulmonic valve regurgitation is not visualized. No evidence of pulmonic stenosis. Aorta: The aortic root is normal in size and structure. Venous: The inferior vena cava is normal in size with greater than 50% respiratory variability, suggesting right atrial pressure of 3 mmHg. IAS/Shunts: No atrial level shunt detected by color flow Doppler.  LEFT VENTRICLE PLAX 2D LVIDd:         4.10 cm   Diastology LVIDs:  3.30 cm   LV e' medial:    5.11 cm/s LV PW:         0.60 cm   LV E/e' medial:  13.0 LV IVS:        0.70 cm   LV e' lateral:   7.83 cm/s LVOT diam:     1.80 cm   LV E/e' lateral: 8.5 LV SV:         51 LV SV Index:   36 LVOT Area:     2.54 cm  RIGHT VENTRICLE         IVC TAPSE (M-mode): 1.9 cm  IVC diam: 1.00 cm LEFT ATRIUM           Index       RIGHT ATRIUM          Index LA diam:      2.60 cm 1.82 cm/m  RA Area:     9.00 cm LA Vol (A4C): 12.4 ml 8.69 ml/m  RA Volume:   20.20 ml 14.16 ml/m  AORTIC VALVE             PULMONIC VALVE LVOT Vmax:   107.00 cm/s PR End Diast Vel: 0.14 msec LVOT Vmean:  79.600 cm/s LVOT VTI:    0.202 m  AORTA Ao Root diam: 3.00 cm MITRAL VALVE                TRICUSPID VALVE MV Area (PHT): 4.29 cm     TR Peak grad:   14.1 mmHg MV Decel Time: 177 msec     TR Vmax:        188.00 cm/s MV E velocity: 66.60 cm/s MV A velocity: 107.00 cm/s  SHUNTS MV E/A ratio:  0.62         Systemic VTI:  0.20 m                             Systemic Diam: 1.80 cm Arvilla Meres MD Electronically signed by Arvilla Meres MD Signature Date/Time: 10/09/2023/11:20:20  AM    Final    CT Head Wo Contrast Result Date: 10/08/2023 CLINICAL DATA:  Stroke-like symptoms EXAM: CT HEAD WITHOUT CONTRAST TECHNIQUE: Contiguous axial images were obtained from the base of the skull through the vertex without intravenous contrast. RADIATION DOSE REDUCTION: This exam was performed according to the departmental dose-optimization program which includes automated exposure control, adjustment of the mA and/or kV according to patient size and/or use of iterative reconstruction technique. COMPARISON:  09/10/2023 FINDINGS: Brain: No evidence of acute infarction, hemorrhage, hydrocephalus, extra-axial collection or mass lesion/mass effect. Mild atrophic changes are noted commensurate with the patient's given age. Vascular: No hyperdense vessel or unexpected calcification. Skull: Normal. Negative for fracture or focal lesion. Sinuses/Orbits: No acute finding. Other: None. IMPRESSION: Chronic atrophic changes without acute abnormality. Electronically Signed   By: Alcide Clever M.D.   On: 10/08/2023 19:18   CT ANGIO HEAD NECK W WO CM (CODE STROKE) Result Date: 10/08/2023 CLINICAL DATA:  Code stroke EXAM: CT ANGIOGRAPHY HEAD AND NECK WITH AND WITHOUT CONTRAST TECHNIQUE: Multidetector CT imaging of the head and neck was performed using the standard protocol during bolus administration of intravenous contrast. Multiplanar CT image reconstructions and MIPs were obtained to evaluate the vascular anatomy. Carotid stenosis measurements (when applicable) are obtained utilizing NASCET criteria, using the distal internal carotid diameter as the denominator. RADIATION DOSE REDUCTION: This exam was performed according to the departmental dose-optimization program which  includes automated exposure control, adjustment of the mA and/or kV according to patient size and/or use of iterative reconstruction technique. CONTRAST:  75mL OMNIPAQUE IOHEXOL 350 MG/ML SOLN COMPARISON:  MR angio head 06/12/2022. FINDINGS: CTA  NECK FINDINGS Aortic arch: A 3 vessel arch configuration is present. Minimal calcifications are present at the distal arch. No significant scratched at the great vessel origins are widely patent. No aneurysm or dissection is present. Right carotid system: The right common carotid artery is within normal limits. The bifurcation is unremarkable. Mild tortuosity is present in the distal cervical right ICA without focal stenosis. Left carotid system: Left common carotid artery is within normal limits. Minimal atherosclerotic changes present at the proximal left ICA without significant stenosis. Cervical left ICA is otherwise normal. Vertebral arteries: The right vertebral artery is dominant. Both vertebral arteries originate from the subclavian arteries without significant stenosis. No significant stenosis is present in either vertebral artery in the neck. Skeleton: Cervical fusion is noted at C4-5 and C5-6. Slight degenerative anterolisthesis is present at C2-3 and C3-4. Other neck: The soft tissues of the neck are otherwise unremarkable. Salivary glands are within normal limits. Thyroid is normal. No significant adenopathy is present. No focal mucosal or submucosal lesions are present. Upper chest: The lung apices are clear. The thoracic inlet is within normal limits. 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 The internal carotid arteries are otherwise within normal limits from the skull base to the ICA termini. The A1 and M1 segments are normal. The anterior communicating artery is patent. The MCA bifurcations are normal bilaterally. There is some attenuation of distal MCA branch vessels bilaterally without a significant proximal stenosis or occlusion. No other aneurysm is present. Posterior circulation: The right vertebral artery is dominant. The PICA origins are visualized and  normal. The left vertebral artery is centrally terminates at the PICA. The basilar artery is within normal limits. The superior cerebellar arteries are patent bilaterally. The right posterior cerebral artery Jeanette some basilar tip. Left posterior cerebral artery is of fetal type. The PCA branch vessels are normal bilaterally. Venous sinuses: The dural sinuses are patent. The straight sinus and deep cerebral veins are intact. Cortical veins are within normal limits. No significant vascular malformation is evident. Anatomic variants: Fetal type left posterior cerebral artery. Review of the MIP images confirms the above findings IMPRESSION: 1. 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 2. No significant proximal stenosis, aneurysm, or branch vessel occlusion within the Circle of Willis. 3. Minimal atherosclerotic change at the proximal left ICA without significant stenosis. 4. Cervical fusion at C4-5 and C5-6. 5. Slight degenerative anterolisthesis at C2-3 and C3-4. 6.  Aortic Atherosclerosis (ICD10-I70.0). These results were called by telephone at the time of interpretation on 10/08/2023 at 5:33 pm to provider ERIC [facility] , who verbally acknowledged these results. Electronically Signed   By: Marin Roberts M.D.   On: 10/08/2023 17:46   CT HEAD CODE STROKE WO CONTRAST Result Date: 10/08/2023 CLINICAL DATA:  Code stroke. Neuro deficit, acute, stroke suspected. EXAM: CT HEAD WITHOUT CONTRAST TECHNIQUE: Contiguous axial images were obtained from the base of the skull through the vertex without intravenous contrast. RADIATION DOSE REDUCTION: This exam was performed according to the departmental dose-optimization program which includes automated exposure control, adjustment of the mA and/or kV according to patient size and/or use of iterative reconstruction technique.  COMPARISON:  MR head without contrast 06/12/2022. FINDINGS: Brain: No acute infarct, hemorrhage, or mass lesion is  present. No significant white matter lesions are present. Deep brain nuclei are within normal limits. The ventricles are of normal size. No significant extraaxial fluid collection is present. The brainstem and cerebellum are within normal limits. Midline structures are within normal limits. Vascular: Atherosclerotic calcifications are present within the cavernous internal carotid arteries bilaterally. No hyperdense vessel is present. Skull: Calvarium is intact. No focal lytic or blastic lesions are present. No significant extracranial soft tissue lesion is present. Sinuses/Orbits: The paranasal sinuses and mastoid air cells are clear. The globes and orbits are within normal limits. ASPECTS [facility] Stroke Program Early CT Score) - Ganglionic level infarction (caudate, lentiform nuclei, internal capsule, insula, M1-M3 cortex): 7/7 - Supraganglionic infarction (M4-M6 cortex): 3/3 Total score (0-10 with 10 being normal): 10/10 IMPRESSION: 1. Normal CT appearance of the brain. 2. Aspects is 10/10. These results were called by telephone at the time of interpretation on 10/08/2023 at 5:34 pm to provider Dr. Otelia Limes, who verbally acknowledged these results. Electronically Signed   By: Marin Roberts M.D.   On: 10/08/2023 17:35     Assessment and Plan:   Newly diagnosed HFrEF Etiology unknown.  Echo shows LVEF 30 to 35% with distal anterior, apical, mid/distal inferoseptal, distal inferior hypokinesis/akinesis.  Question embolic, question Takotsubo's variant, question CAD.  Patient with no history of chest pain prior parentheses (per report she is currently resting.  EKG with no Q waves  Currently appears relatively euvolemic.   Recommendations: -Would begin goal-directed medical therapy for LV dysfunction once neurology gives okay.  Want to avoid hypotension now. -Would discontinue hydrochlorothiazide. -Will need repeat echocardiogram in a few months to assess for recovery.  If no coverage, further  assessment and testing will be decided at that time. -Would get troponin to see if elevated, possibly help in time course.  Again she is comfortable.  Hyperlipidemia LDL 33. On atorvastatin 40 mg and Zetia 10 mg now.  Check LDL in 6 to 8 weeks.  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  Patient currently in sinus rhythm.  No evidence of atrial fibrillation here. Normal sized LA.  No evidence for mural thrombus in LV, though this does not rule out previous thrombus.  Again timing of LV dysfunction is unknown. On aspirin and Plavix Question need for TEE and / or LINQ   WIll review with neurology Keep on telemetry     Hypertension.  BP is controlled.  Follow   Risk Assessment/Risk Scores:   New York Heart Association (NYHA) Functional Class NYHA Class II        For questions or updates, please contact  [facility] Please consult [email or web address] for contact info under    Signed, Abagail Kitchens, PA-C  10/10/2023 2:41 PM   Patient seen and examined.  I have amended the note above by Thamas Jaegers to reflect my findings.  Patient is a 73 year old woman with a history of hyperlipidemia, hypertension, prediabetes.  Presented with CVA.  Received lytic therapy.  Echocardiogram shows moderate to severe LV dysfunction with wall motion abnormalities as noted above.  Again per report no history of chest pain.  EKG without Q waves to suggest prior MI  On exam, patient is resting, does not want to talk. Comfortable laying flat in bed.  Neck: No obvious JVD Lungs: CTA.  No rales Cardiac exam regular rate and rhythm.  No S3.  No significant murmurs. Abdomen deferred. Extremities: Feet warm.  No lower extremity edema.   Impression: As noted above, newly documented cardiomyopathy.  Regional wall motion suggestive for some type of event.  Question of Takotsubo's varient in the setting of CVA, question embolic, question CAD    Recommendations as noted above    Dietrich Pates MD

## 2023-10-10 NOTE — Progress Notes (Signed)
  Inpatient Rehabilitation Admissions Coordinator   Met with spouse outside of room. He states she is exhausted and asks that I return tomorrow to discuss rehab venue. Please call me with any questions.   Ottie Glazier, RN, MSN Rehab Admissions Coordinator [phone]

## 2023-10-11 DIAGNOSIS — I63411 Cerebral infarction due to embolism of right middle cerebral artery: Secondary | ICD-10-CM | POA: Diagnosis not present

## 2023-10-11 DIAGNOSIS — I5041 Acute combined systolic (congestive) and diastolic (congestive) heart failure: Secondary | ICD-10-CM | POA: Diagnosis not present

## 2023-10-11 DIAGNOSIS — E119 Type 2 diabetes mellitus without complications: Secondary | ICD-10-CM | POA: Diagnosis not present

## 2023-10-11 DIAGNOSIS — E785 Hyperlipidemia, unspecified: Secondary | ICD-10-CM | POA: Diagnosis not present

## 2023-10-11 DIAGNOSIS — I639 Cerebral infarction, unspecified: Secondary | ICD-10-CM | POA: Diagnosis not present

## 2023-10-11 DIAGNOSIS — E039 Hypothyroidism, unspecified: Secondary | ICD-10-CM

## 2023-10-11 DIAGNOSIS — I69391 Dysphagia following cerebral infarction: Secondary | ICD-10-CM | POA: Diagnosis not present

## 2023-10-11 HISTORY — DX: Acute combined systolic (congestive) and diastolic (congestive) heart failure: I50.41

## 2023-10-11 LAB — GLUCOSE, CAPILLARY
Glucose-Capillary: 109 mg/dL — ABNORMAL HIGH (ref 70–99)
Glucose-Capillary: 165 mg/dL — ABNORMAL HIGH (ref 70–99)
Glucose-Capillary: 133 mg/dL — ABNORMAL HIGH (ref 70–99)
Glucose-Capillary: 153 mg/dL — ABNORMAL HIGH (ref 70–99)

## 2023-10-11 LAB — MAGNESIUM: Magnesium: 2.1 mg/dL (ref 1.7–2.4)

## 2023-10-11 MED ORDER — METOPROLOL SUCCINATE ER 25 MG PO TB24
25.0000 mg | ORAL_TABLET | Freq: Every day | ORAL | Status: DC
  Administered 2023-10-11 – 2023-10-12 (×2): 25 mg via ORAL
  Filled 2023-10-11 (×2): qty 1

## 2023-10-11 NOTE — Care Management Important Message (Signed)
 Important Message  Patient Details  Name: Gabrielle Vargas MRN: [id number] Date of Birth: [birth year]   Important Message Given:  Yes - Medicare IM     Dorena Bodo 10/11/2023, 2:45 PM

## 2023-10-11 NOTE — Plan of Care (Signed)
  Problem: Education: Goal: Knowledge of disease or condition will improve Outcome: Progressing   Problem: Self-Care: Goal: Ability to participate in self-care as condition permits will improve Outcome: Progressing   Problem: Nutrition: Goal: Risk of aspiration will decrease Outcome: Progressing

## 2023-10-11 NOTE — Progress Notes (Signed)
  Inpatient Rehabilitation Admissions Coordinator   Met with patient and spouse at bedside for rehab assessment. We discussed goals and expectations of a possible CIR admit. They prefer CIR for rehab. Family can provide expected caregiver support that is recommended . Dr Carlis Abbott has completed Rehab Consult. I have reviewed estimated cost of care of a possible CIR admit.  I will begin insurance Auth with [employer] for possible CIR admit pending approval. Please call me with any questions.   Ottie Glazier, RN, MSN Rehab Admissions Coordinator [phone]

## 2023-10-11 NOTE — PMR Pre-admission (Signed)
 PMR Admission Coordinator Pre-Admission Assessment  Patient: Gabrielle Vargas is an 73 y.o., female MRN: [id number] DOB: March 31, 1951 Height: 4\' 11"  (149.9 cm) Weight: 49.6 kg              Insurance Information HMO: ***    PPO: ***     PCP: ***     IPA: ***     80/20: ***     OTHER: *** PRIMARY: ***      Policy#: ***      Subscriber: *** CM Name: ***      Phone#: ***     Fax#: *** Pre-Cert#: ***      Employer: *** Benefits:  Phone #: ***     Name: *** Eff. Date: ***     Deduct: ***      Out of Pocket Max: ***      Life Max: ***  CIR: ***      SNF: *** Outpatient: ***     Co-Pay: *** Home Health: ***      Co-Pay: *** DME: ***     Co-Pay: *** Providers: *** SECONDARY: ***      Policy#: ***      Phone#: ***  Financial Counselor: ***      Phone#: ***  The "Data Collection Information Summary" for patients in Inpatient Rehabilitation Facilities with attached "Privacy Act Statement-Health Care Records" was provided and verbally reviewed with: Patient and Family  Emergency Contact Information Contact Information     Name Relation Home Work Mobile   Vinciguerra,George Spouse [phone]  [phone]      Other Contacts   None on File    Current Medical History  Patient Admitting Diagnosis: CVA  History of Present Illness: ***  Complete NIHSS TOTAL: 2 Glasgow Coma Scale Score: 14  Patient's medical record from *** has been reviewed by the rehabilitation admission coordinator and physician.  Past Medical History  Past Medical History:  Diagnosis Date   ADHD (attention deficit hyperactivity disorder) 05/09/2007   Diverticulitis of colon (without mention of hemorrhage) 09/17/2013   Essential hypertension 10/09/2017   History of COVID-19 2021   Hyperlipidemia    Hypothyroidism    Low back pain radiating to right leg 10/09/2017   Mild cognitive impairment of uncertain or unknown etiology 08/04/2022   Mild intermittent asthma with acute exacerbation 05/30/2017    Osteoarthritis    Postmenopausal atrophic vaginitis, severe 08/18/2008   Right knee pain 03/13/2014   S/P arthroscopy of right knee 06/03/2014   Vitamin B12 deficiency    Has the patient had major surgery during 100 days prior to admission? No  Family History  family history includes ADD / ADHD in an other family member; Breast cancer in her paternal grandmother; Diabetes in her father; Heart disease in her father; Kidney disease in her father; Melanoma in her mother.   Current Medications   Current Facility-Administered Medications:    acetaminophen (TYLENOL) tablet 650 mg, 650 mg, Oral, Q4H PRN **OR** acetaminophen (TYLENOL) 160 MG/5ML solution 650 mg, 650 mg, Per Tube, Q4H PRN **OR** acetaminophen (TYLENOL) suppository 650 mg, 650 mg, Rectal, Q4H PRN, Caryl Pina, MD   aspirin chewable tablet 81 mg, 81 mg, Oral, Daily, 81 mg at 10/11/23 1026 **OR** aspirin suppository 300 mg, 300 mg, Rectal, Daily, Richardo Priest, Erin C, NP   atorvastatin (LIPITOR) tablet 40 mg, 40 mg, Oral, Daily, Gevena Mart A, NP, 40 mg at 10/11/23 1027   Chlorhexidine Gluconate Cloth 2 % PADS 6 each, 6 each, Topical, Daily, Lindzen,  Minerva Areola, MD, 6 each at 10/11/23 1027   clopidogrel (PLAVIX) tablet 75 mg, 75 mg, Oral, Daily, Richardo Priest, Erin C, NP, 75 mg at 10/11/23 1026   cyanocobalamin (VITAMIN B12) tablet 1,000 mcg, 1,000 mcg, Oral, Once per day on Monday Thursday, Caryl Pina, MD   enoxaparin (LOVENOX) injection 40 mg, 40 mg, Subcutaneous, Q24H, Sethi, Pramod S, MD, 40 mg at 10/11/23 1027   insulin aspart (novoLOG) injection 0-15 Units, 0-15 Units, Subcutaneous, TID WC, Caryl Pina, MD   levothyroxine (SYNTHROID) tablet 75 mcg, 75 mcg, Oral, Daily, Caryl Pina, MD, 75 mcg at 10/11/23 1610   metoprolol succinate (TOPROL-XL) 24 hr tablet 25 mg, 25 mg, Oral, Daily, Little Ishikawa, MD, 25 mg at 10/11/23 1158   Oral care mouth rinse, 15 mL, Mouth Rinse, PRN, Micki Riley, MD   senna-docusate (Senokot-S) tablet  1 tablet, 1 tablet, Oral, QHS PRN, Caryl Pina, MD  Patients Current Diet:  Diet Order             Diet regular Room service appropriate? Yes with Assist; Fluid consistency: Thin  Diet effective now                   Precautions / Restrictions Precautions Precautions: Fall Restrictions Weight Bearing Restrictions Per Provider Order: No   Has the patient had 2 or more falls or a fall with injury in the past year?No  Prior Activity Level Community (5-7x/wk): independent, very active , walks 4 to 6 miles per day  Prior Functional Level Prior Function Prior Level of Function : Independent/Modified Independent, Driving Mobility Comments: no DME, walks 4-6 miles daily ADLs Comments: independent, does IADL  Self Care: Did the patient need help bathing, dressing, using the toilet or eating?  Independent  Indoor Mobility: Did the patient need assistance with walking from room to room (with or without device)? Independent  Stairs: Did the patient need assistance with internal or external stairs (with or without device)? Independent  Functional Cognition: Did the patient need help planning regular tasks such as shopping or remembering to take medications? Independent  Patient Information Are you of Hispanic, Latino/a,or Spanish origin?: A. No, not of Hispanic, Latino/a, or Spanish origin What is your race?: A. White Do you need or want an interpreter to communicate with a doctor or health care staff?: 0. No  Patient's Response To:  Health Literacy and Transportation Is the patient able to respond to health literacy and transportation needs?: Yes Health Literacy - How often do you need to have someone help you when you read instructions, pamphlets, or other written material from your doctor or pharmacy?: Never In the past 12 months, has lack of transportation kept you from medical appointments or from getting medications?: No In the past 12 months, has lack of transportation  kept you from meetings, work, or from getting things needed for daily living?: No  Home Assistive Devices / Equipment Home Equipment: None  Prior Device Use: Indicate devices/aids used by the patient prior to current illness, exacerbation or injury? None of the above  Current Functional Level Cognition  Orientation Level: Oriented to person, Oriented to place, Oriented to situation    Extremity Assessment (includes Sensation/Coordination)  Upper Extremity Assessment: Defer to OT evaluation LUE Deficits / Details: generally 4/5 strength, inconsistent awarness and use of LUE. MMT varied - limited by cognition LUE Sensation:  (need to assess) LUE Coordination: decreased gross motor  Lower Extremity Assessment: LLE deficits/detail LLE Deficits / Details: L-sided inattention/neglect with inconsistent  command follows. Pt demonstrates some motor apraxia (i.e marches and LAQs)    ADLs  Overall ADL's : Needs assistance/impaired Eating/Feeding: NPO Grooming: Minimal assistance, Wash/dry face, Cueing for sequencing, Cueing for compensatory techniques, Sitting Grooming Details (indicate cue type and reason): EOB, Pt able to bring hand to face for tasks, attempted to put on lip balm with lid on. Could not problem solve despite multimodal max cues - required physical assist Upper Body Bathing: Moderate assistance, Sitting Lower Body Bathing: Moderate assistance, Sitting/lateral leans Upper Body Dressing : Moderate assistance, Sitting Upper Body Dressing Details (indicate cue type and reason): to don additional gown like robe Lower Body Dressing: Maximal assistance, Sitting/lateral leans Lower Body Dressing Details (indicate cue type and reason): Pt unable to doff socks. When provided sock, Pt able to put on her R foot, but could not put it on L. Pt is flexible and able to perform figure 4 Toilet Transfer: Minimal assistance, Cueing for safety, Ambulation Toilet Transfer Details (indicate cue type  and reason): 1 person HHA Toileting- Clothing Manipulation and Hygiene: Maximal assistance, Sit to/from stand Toileting - Clothing Manipulation Details (indicate cue type and reason): Pt wet and unaware Functional mobility during ADLs: Minimal assistance, Cueing for safety, Cueing for sequencing (1 person hand held assist)    Mobility  Overal bed mobility: Needs Assistance Bed Mobility: Supine to Sit Supine to sit: Mod assist, +2 for physical assistance General bed mobility comments: ModA +2 to transition hips and  legs EOB. Patient inconsistent with command following    Transfers  Overall transfer level: Needs assistance Equipment used: 2 person hand held assist Transfers: Sit to/from Stand Sit to Stand: Contact guard assist Bed to/from chair/wheelchair/BSC transfer type:: Step pivot Step pivot transfers: Min assist, +2 safety/equipment General transfer comment: CGA+2 to ensure safety with sit > stand. Patient uses LE support to power up from a seated position    Ambulation / Gait / Stairs / Wheelchair Mobility  Ambulation/Gait Ambulation/Gait assistance: Contact guard assist, +2 safety/equipment Gait Distance (Feet): 150 Feet Assistive device: 2 person hand held assist Gait Pattern/deviations: Decreased stride length General Gait Details: CGA +2; Cues to increase cadence, slow processing when dual-tasking Gait velocity: Decreased    Posture / Balance Dynamic Sitting Balance Sitting balance - Comments: Requires some cues to maintain upright sitting posture Balance Overall balance assessment: Needs assistance Sitting-balance support: Feet supported Sitting balance-Leahy Scale: Fair Sitting balance - Comments: Requires some cues to maintain upright sitting posture Postural control: Posterior lean, Left lateral lean Standing balance support: Bilateral upper extremity supported Standing balance-Leahy Scale: Poor Standing balance comment: HHA +2 to ensure safety    Special  needs/care consideration LOOP to be placed prior to admit to CIR     Previous Home Environment  Living Arrangements: Spouse/significant other  Lives With: Spouse Available Help at Discharge: Family, Available 24 hours/day Type of Home: House Home Layout: Multi-level, Able to live on main level with bedroom/bathroom Home Access: Stairs to enter Entrance Stairs-Rails: None Entrance Stairs-Number of Steps: 2 Bathroom Shower/Tub: Health visitor: Standard Bathroom Accessibility: Yes How Accessible: Accessible via walker Home Care Services: No  Discharge Living Setting Plans for Discharge Living Setting: Patient's home, Lives with (comment) (spouse) Type of Home at Discharge: House Discharge Home Layout: Multi-level, Able to live on main level with bedroom/bathroom Discharge Home Access: Stairs to enter Entrance Stairs-Rails: None Entrance Stairs-Number of Steps: 2 Discharge Bathroom Shower/Tub: Walk-in shower Discharge Bathroom Toilet: Standard Discharge Bathroom Accessibility: Yes How Accessible: Accessible via  walker Does the patient have any problems obtaining your medications?: No  Social/Family/Support Systems Patient Roles: Spouse, Parent Contact Information: spouse Anticipated Caregiver: spouse and daughter Anticipated Caregiver's Contact Information: see contacts Ability/Limitations of Caregiver: no limitations; very involved Caregiver Availability: 24/7 Discharge Plan Discussed with Primary Caregiver: Yes Is Caregiver In Agreement with Plan?: Yes Does Caregiver/Family have Issues with Lodging/Transportation while Pt is in Rehab?: No  Goals Patient/Family Goal for Rehab: supervision with PT, OT and SLP Expected length of stay: ELOS 10 to 12 days Pt/Family Agrees to Admission and willing to participate: Yes Program Orientation Provided & Reviewed with Pt/Caregiver Including Roles  & Responsibilities: Yes  Decrease burden of Care through IP rehab  admission: n/a  Possible need for SNF placement upon discharge:not anticipated  Patient Condition: This patient's condition remains as documented in the consult dated 10/11/23, in which the Rehabilitation Physician determined and documented that the patient's condition is appropriate for intensive rehabilitative care in an inpatient rehabilitation facility. Will admit to inpatient rehab today.  Preadmission Screen Completed By:  Clois Dupes, RN MSN 10/11/2023 6:22 PM ______________________________________________________________________   Discussed status with Dr. Marland Kitchenon***at *** and received approval for admission today.  Admission Coordinator:  Clois Dupes, RN MSN time***/Date***

## 2023-10-11 NOTE — Evaluation (Signed)
 SLP Cancellation Note  Patient Details Name: Gabrielle Vargas MRN: [id number] DOB: 1951-04-15   Cancelled treatment:       Reason Eval/Treat Not Completed: Other (comment) (pt literally just received her meal tray, will continue efforts to allow her to obtain adequate nutrition, spouse report pt with much improved communication today)  Rolena Infante, MS [facility] SLP Acute Rehab Services Office [phone]  Chales Abrahams 10/11/2023, 9:12 AM

## 2023-10-11 NOTE — Progress Notes (Signed)
 Rounding Note    [name] Date of Encounter: 10/11/2023  [facility] Cardiologist: None   Subjective   Denies any chest pain or dyspnea  Inpatient Medications    Scheduled Meds:  aspirin  81 mg Oral Daily   Or   aspirin  300 mg Rectal Daily   atorvastatin  40 mg Oral Daily   Chlorhexidine Gluconate Cloth  6 each Topical Daily   clopidogrel  75 mg Oral Daily   cyanocobalamin  1,000 mcg Oral Once per day on Monday Thursday   enoxaparin (LOVENOX) injection  40 mg Subcutaneous Q24H   insulin aspart  0-15 Units Subcutaneous TID WC   levothyroxine  75 mcg Oral Daily   Continuous Infusions:  PRN Meds: acetaminophen **OR** acetaminophen (TYLENOL) oral liquid 160 mg/5 mL **OR** acetaminophen, mouth rinse, senna-docusate   Vital Signs    Vitals:   10/10/23 1810 10/10/23 1927 10/10/23 2316 10/11/23 0745  BP: 127/80 106/63 110/79 102/72  Pulse:  76 74 67  Resp:  18 17 16   Temp:  98.4 F (36.9 C) 98.3 F (36.8 C) (!) 97.5 F (36.4 C)  TempSrc:  Oral Oral Oral  SpO2:  96% 94% 93%  Weight:      Height:       No intake or output data in the 24 hours ending 10/11/23 0916    10/08/2023    5:00 PM 10/04/2023    2:07 PM 09/05/2023    8:30 AM  Last 3 Weights  Weight (lbs) 109 lb 5.6 oz 110 lb 111 lb  Weight (kg) 49.6 kg 49.896 kg 50.349 kg      Telemetry    NSR - Personally Reviewed  ECG    No new ECG - Personally Reviewed  Physical Exam   GEN: No acute distress.   Neck: No JVD Cardiac: RRR, no murmurs, rubs, or gallops.  Respiratory: Clear to auscultation bilaterally. GI: Soft, nontender, non-distended  MS: No edema; No deformity. Neuro:  Nonfocal  Psych: Normal affect   Labs    High Sensitivity Troponin:  No results for input(s): "TROPONINIHS" in the last 720 hours.   Chemistry Recent Labs  Lab 10/08/23 1710 10/08/23 1717 10/09/23 0937 10/10/23 0911  NA 135 133* 129* 134*  K 2.9* 2.8* 3.5 4.0  CL 93* 93* 93*  100  CO2 30  --  22 20*  GLUCOSE 117* 110* 98 97  BUN 12 13 9 13   CREATININE 0.70 0.70 0.56 0.63  CALCIUM 9.2  --  8.7* 8.9  MG  --   --  1.7  --   PROT 6.4*  --   --   --   ALBUMIN 3.6  --   --   --   AST 24  --   --   --   ALT 17  --   --   --   ALKPHOS 73  --   --   --   BILITOT 0.5  --   --   --   GFRNONAA >60  --  >60 >60  ANIONGAP 12  --  14 14    Lipids  Recent Labs  Lab 10/09/23 0712  CHOL 102  TRIG 87  HDL 52  LDLCALC 33  CHOLHDL 2.0    Hematology Recent Labs  Lab 10/08/23 1710 10/08/23 1717 10/09/23 1149 10/10/23 0911  WBC 5.8  --  5.2 5.6  RBC 4.67  --  4.89 5.12*  HGB 13.4 12.9 14.0  14.4  HCT 39.6 38.0 39.7 42.6  MCV 84.8  --  81.2 83.2  MCH 28.7  --  28.6 28.1  MCHC 33.8  --  35.3 33.8  RDW 12.4  --  12.1 12.5  PLT 177  --  167 147*   Thyroid No results for input(s): "TSH", "FREET4" in the last 168 hours.  BNPNo results for input(s): "BNP", "PROBNP" in the last 168 hours.  DDimer No results for input(s): "DDIMER" in the last 168 hours.   Radiology    MR BRAIN WO CONTRAST Result Date: 10/09/2023 CLINICAL DATA:  Stroke, follow up EXAM: MRI HEAD WITHOUT CONTRAST TECHNIQUE: Multiplanar, multiecho pulse sequences of the brain and surrounding structures were obtained without intravenous contrast. COMPARISON:  CT head October 08, 2023. MRI head November 26, 23. FINDINGS: Significantly motion limited study. Brain: Multiple punctate foci of restricted diffusion in bilateral frontal lobes and the left parietal lobe. No evidence of acute hemorrhage, mass lesion, midline shift or hydrocephalus. Cerebral atrophy. Vascular: Major arterial flow voids are maintained at the skull base. Skull and upper cervical spine: Normal marrow signal. Sinuses/Orbits: Mostly clear sinuses. IMPRESSION: 1. Findings compatible with multiple punctate acute infarcts in bilateral frontal and left parietal lobes. Given involvement of multiple vascular territories, consider an embolic etiology.  2. Significantly motion limited study. Electronically Signed   By: Feliberto Harts M.D.   On: 10/09/2023 22:53   EEG adult Result Date: 10/09/2023 Charlsie Quest, MD     10/09/2023  4:41 PM [name] MRN: [id number] Epilepsy Attending: Charlsie Quest Referring Physician/Provider: Mathews Argyle, NP Date: 10/09/2023 Duration: 22.07 mins Patient history: 73 year old lady presented with sudden onset of speech difficulties and left hemiparesis. EEG to evaluate for seizure Level of alertness: comatose/ lethargic AEDs during EEG study: None Technical aspects: This EEG study was done with scalp electrodes positioned according to the 10-20 International system of electrode placement. Electrical activity was reviewed with band pass filter of 1-70Hz , sensitivity of 7 uV/mm, display speed of 63mm/sec with a 60Hz  notched filter applied as appropriate. EEG data were recorded continuously and digitally stored.  Video monitoring was available and reviewed as appropriate. Description: EEG showed continuous generalized polymorphic 3 to 6 Hz theta-delta slowing. Hyperventilation and photic stimulation were not performed.   ABNORMALITY - Continuous slow, generalized IMPRESSION: This study is suggestive of moderate diffuse encephalopathy. No seizures or epileptiform discharges were seen throughout the recording. Charlsie Quest   ECHOCARDIOGRAM COMPLETE Result Date: 10/09/2023    ECHOCARDIOGRAM REPORT   Patient Name:   Gabrielle Vargas Date of Exam: 10/09/2023 Medical Rec #:  [id number]            Height:       59.0 in Accession #:    [id number]           Weight:       109.3 lb Date of Birth:  1950/10/13            BSA:          1.427 m Patient Age:    73 years             BP:           118/71 mmHg Patient Gender: F                    HR:           73 bpm. Exam Location:  Inpatient Procedure: 2D Echo, Cardiac  Doppler, Color Doppler and Intracardiac            Opacification Agent (Both Spectral and  Color Flow Doppler were            utilized during procedure). Indications:    Stroke I63.9  History:        Patient has no prior history of Echocardiogram examinations.                 Risk Factors:Hypertension and Dyslipidemia.  Sonographer:    Harriette Bouillon RDCS Referring Phys: [phone] ERIC LINDZEN IMPRESSIONS  1. Left ventricular ejection fraction, by estimation, is 30 to 35%. The left ventricle has moderately decreased function. The left ventricle demonstrates regional wall motion abnormalities (see scoring diagram/findings for description). The left ventricular internal cavity size was mildly dilated. Left ventricular diastolic parameters are consistent with Grade I diastolic dysfunction (impaired relaxation).  2. Right ventricular systolic function is normal. The right ventricular size is normal. There is normal pulmonary artery systolic pressure.  3. The mitral valve is normal in structure. Mild mitral valve regurgitation. No evidence of mitral stenosis.  4. The aortic valve is tricuspid. There is mild calcification of the aortic valve. Aortic valve regurgitation is mild. No aortic stenosis is present.  5. The inferior vena cava is normal in size with greater than 50% respiratory variability, suggesting right atrial pressure of 3 mmHg. Conclusion(s)/Recommendation(s): No obvious LV thrombus with Definity contrast however given apical and mid to distal septal WMAs patient seems to be at high risk for cardio-embolic events. FINDINGS  Left Ventricle: Left ventricular ejection fraction, by estimation, is 30 to 35%. The left ventricle has moderately decreased function. The left ventricle demonstrates regional wall motion abnormalities. The left ventricular internal cavity size was mildly dilated. There is no left ventricular hypertrophy. Left ventricular diastolic parameters are consistent with Grade I diastolic dysfunction (impaired relaxation).  LV Wall Scoring: The mid and distal anterior septum and mid  inferoseptal segment are dyskinetic. The apical inferior segment is akinetic. The apical anterior segment and apex are hypokinetic. Right Ventricle: The right ventricular size is normal. No increase in right ventricular wall thickness. Right ventricular systolic function is normal. There is normal pulmonary artery systolic pressure. The tricuspid regurgitant velocity is 1.88 m/s, and  with an assumed right atrial pressure of 3 mmHg, the estimated right ventricular systolic pressure is 17.1 mmHg. Left Atrium: Left atrial size was normal in size. Right Atrium: Right atrial size was normal in size. Pericardium: There is no evidence of pericardial effusion. Mitral Valve: The mitral valve is normal in structure. Mild mitral valve regurgitation. No evidence of mitral valve stenosis. Tricuspid Valve: The tricuspid valve is normal in structure. Tricuspid valve regurgitation is trivial. No evidence of tricuspid stenosis. Aortic Valve: The aortic valve is tricuspid. There is mild calcification of the aortic valve. Aortic valve regurgitation is mild. No aortic stenosis is present. Pulmonic Valve: The pulmonic valve was normal in structure. Pulmonic valve regurgitation is not visualized. No evidence of pulmonic stenosis. Aorta: The aortic root is normal in size and structure. Venous: The inferior vena cava is normal in size with greater than 50% respiratory variability, suggesting right atrial pressure of 3 mmHg. IAS/Shunts: No atrial level shunt detected by color flow Doppler.  LEFT VENTRICLE PLAX 2D LVIDd:         4.10 cm   Diastology LVIDs:         3.30 cm   LV e' medial:    5.11 cm/s LV  PW:         0.60 cm   LV E/e' medial:  13.0 LV IVS:        0.70 cm   LV e' lateral:   7.83 cm/s LVOT diam:     1.80 cm   LV E/e' lateral: 8.5 LV SV:         51 LV SV Index:   36 LVOT Area:     2.54 cm  RIGHT VENTRICLE         IVC TAPSE (M-mode): 1.9 cm  IVC diam: 1.00 cm LEFT ATRIUM           Index       RIGHT ATRIUM          Index LA diam:       2.60 cm 1.82 cm/m  RA Area:     9.00 cm LA Vol (A4C): 12.4 ml 8.69 ml/m  RA Volume:   20.20 ml 14.16 ml/m  AORTIC VALVE             PULMONIC VALVE LVOT Vmax:   107.00 cm/s PR End Diast Vel: 0.14 msec LVOT Vmean:  79.600 cm/s LVOT VTI:    0.202 m  AORTA Ao Root diam: 3.00 cm MITRAL VALVE                TRICUSPID VALVE MV Area (PHT): 4.29 cm     TR Peak grad:   14.1 mmHg MV Decel Time: 177 msec     TR Vmax:        188.00 cm/s MV E velocity: 66.60 cm/s MV A velocity: 107.00 cm/s  SHUNTS MV E/A ratio:  0.62         Systemic VTI:  0.20 m                             Systemic Diam: 1.80 cm Arvilla Meres MD Electronically signed by Arvilla Meres MD Signature Date/Time: 10/09/2023/11:20:20 AM    Final     Cardiac Studies     Patient Profile     73 y.o. female  73 y.o. female with a hx of hypertension, hyperlipidemia, prediabetes, hypothyroidism, ADHD who is being seen 10/10/2023 for the evaluation of new HFrEF at the request of neurology.  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  MRI showed multiple acute infarcts in bilateral frontal/left parietal lobes.  Echocardiogram shows EF 30 to 35%.  Assessment & Plan    Acute combined heart failure: Echocardiogram shows LVEF 30 to 35% with regional wall motion abnormalities.  Could represent CAD versus Takotsubo's variant as presented with acute CVA -Discussed with Dr. Pearlean Brownie, she is outside the window for permissive hypertension and okay to start GDMT.  BP on soft side, will add Toprol-XL today, plan to add losartan tomorrow if BP tolerating.  Can add SGLT2 and spironolactone as outpatient -Recommend repeat echocardiogram as outpatient in next 3 months.  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  MRI showed multiple acute infarcts in bilateral frontal/left parietal lobes. -Neurology recommending  loop recorder, will discuss with EP -Continue aspirin plus Plavix per neurology.  Did discuss anticoagulation empirically with neurology given low EF on echo but in absence of LV thrombus seen, recommendation is antiplatelets at this time  Hyperlipidemia: Continue atorvastatin 40 mg daily, Zetia 10 mg daily.  LDL 33   For questions or updates, please contact [facility] Please consult [email or web address] for contact info under        Signed, Little Ishikawa, MD  10/11/2023, 9:16 AM

## 2023-10-11 NOTE — Consult Note (Signed)
 Physical Medicine and Rehabilitation Consult Reason for Consult: Acute ischemic CVA Referring Physician: Stroke MD   HPI: Gabrielle Vargas is a 73 y.o. female with a history of HTN, HLD, prediabetes, hypothyroidism, ADHD, new HFrEF, who presented with an acute ischemic right MCA infarct s/p TNK. She was started on aspirin and Plavix. Deficits include new onset left sided weakness and dysphagia. Husband has noted improved function and states she was able to ambulate today.   ROS +left sided weakness and dsyphagia Past Medical History:  Diagnosis Date   ADHD (attention deficit hyperactivity disorder) 05/09/2007   Diverticulitis of colon (without mention of hemorrhage) 09/17/2013   Essential hypertension 10/09/2017   History of COVID-19 2021   Hyperlipidemia    Hypothyroidism    Low back pain radiating to right leg 10/09/2017   Mild cognitive impairment of uncertain or unknown etiology 08/04/2022   Mild intermittent asthma with acute exacerbation 05/30/2017   Osteoarthritis    Postmenopausal atrophic vaginitis, severe 08/18/2008   Right knee pain 03/13/2014   S/P arthroscopy of right knee 06/03/2014   Vitamin B12 deficiency    Past Surgical History:  Procedure Laterality Date   ABDOMINAL HYSTERECTOMY     BREAST SURGERY     CARPAL TUNNEL RELEASE Bilateral    CERVICAL DISC ARTHROPLASTY     CESAREAN SECTION     x2   left shoulder surgery     MENISCUS REPAIR Right 2017   REDUCTION MAMMAPLASTY Bilateral    right shoulder     TONSILLECTOMY     Family History  Problem Relation Age of Onset   Melanoma Mother    Kidney disease Father    Diabetes Father    Heart disease Father    Breast cancer Paternal Grandmother    ADD / ADHD Other        multiple family members   Colon cancer Neg Hx    Esophageal cancer Neg Hx    Rectal cancer Neg Hx    Stomach cancer Neg Hx    Social History:  reports that she has never smoked. She has never used smokeless tobacco. She  reports that she does not drink alcohol and does not use drugs. Allergies:  Allergies  Allergen Reactions   Oxycodone-Acetaminophen Nausea Only   Latex Hives and Rash   Medications Prior to Admission  Medication Sig Dispense Refill   Acetylcysteine (NAC) 500 MG CAPS Take 500 mg by mouth daily.     amphetamine-dextroamphetamine (ADDERALL) 20 MG tablet Take 1 tablet (20 mg total) by mouth 2 (two) times daily. (Patient taking differently: Take 20 mg by mouth daily.) 60 tablet 0   Ascorbic Acid (VITAMIN C) 1000 MG tablet Take 1,000 mg by mouth daily.     benzonatate (TESSALON) 200 MG capsule Take 1 capsule (200 mg total) by mouth 3 (three) times daily as needed. 30 capsule 1   Bioflavonoid Products (QUERCETIN COMPLEX IMMUNE PO) Take 500 mg by mouth daily.     Cholecalciferol (D3-50 PO) Take 1 capsule by mouth daily.     Evening Primrose Oil 1000 MG CAPS Take 1,000 mg by mouth in the morning and at bedtime.     ezetimibe (ZETIA) 10 MG tablet TAKE 1 TABLET BY MOUTH DAILY (Patient taking differently: Take 10 mg by mouth at bedtime.) 90 tablet 3   hydrochlorothiazide (HYDRODIURIL) 25 MG tablet TAKE 1 TABLET BY MOUTH DAILY 90 tablet 1   metFORMIN (GLUCOPHAGE) 500 MG tablet TAKE 2 TABLETS(1000 MG) BY  MOUTH TWICE DAILY WITH A MEAL 360 tablet 1   simvastatin (ZOCOR) 20 MG tablet TAKE 1 TABLET(20 MG) BY MOUTH EVERY OTHER DAY 45 tablet 3   SYNTHROID 75 MCG tablet TAKE 1 TABLET BY MOUTH DAILY 90 tablet 3   vitamin B-12 (CYANOCOBALAMIN) 1000 MCG tablet Take 1,000 mcg by mouth 2 (two) times a week.     Zinc 20 MG CAPS Take 20 mg by mouth daily.      Home: Home Living Family/patient expects to be discharged to:: Private residence Living Arrangements: Spouse/significant other Available Help at Discharge: Family Type of Home: House Home Access: Stairs to enter Secretary/administrator of Steps: 2 Entrance Stairs-Rails: None Home Layout: Multi-level, Able to live on main level with  bedroom/bathroom Bathroom Shower/Tub: Health visitor:  (Comfort) Home Equipment: None  Functional History: Prior Function Prior Level of Function : Independent/Modified Independent, Driving Mobility Comments: no DME, walks 4-6 miles daily ADLs Comments: independent, does IADL Functional Status:  Mobility: Bed Mobility Overal bed mobility: Needs Assistance Bed Mobility: Supine to Sit Supine to sit: Mod assist, +2 for physical assistance General bed mobility comments: ModA +2 to transition hips and  legs EOB. Patient inconsistent with command following Transfers Overall transfer level: Needs assistance Equipment used: 2 person hand held assist Transfers: Sit to/from Stand Sit to Stand: Contact guard assist Bed to/from chair/wheelchair/BSC transfer type:: Step pivot Step pivot transfers: Min assist, +2 safety/equipment General transfer comment: CGA+2 to ensure safety with sit > stand. Patient uses LE support to power up from a seated position Ambulation/Gait Ambulation/Gait assistance: Contact guard assist, +2 safety/equipment Gait Distance (Feet): 150 Feet Assistive device: 2 person hand held assist Gait Pattern/deviations: Decreased stride length General Gait Details: CGA +2; Cues to increase cadence, slow processing when dual-tasking Gait velocity: Decreased    ADL: ADL Overall ADL's : Needs assistance/impaired Eating/Feeding: NPO Grooming: Minimal assistance, Wash/dry face, Cueing for sequencing, Cueing for compensatory techniques, Sitting Grooming Details (indicate cue type and reason): EOB, Pt able to bring hand to face for tasks, attempted to put on lip balm with lid on. Could not problem solve despite multimodal max cues - required physical assist Upper Body Bathing: Moderate assistance, Sitting Lower Body Bathing: Moderate assistance, Sitting/lateral leans Upper Body Dressing : Moderate assistance, Sitting Upper Body Dressing Details (indicate cue type  and reason): to don additional gown like robe Lower Body Dressing: Maximal assistance, Sitting/lateral leans Lower Body Dressing Details (indicate cue type and reason): Pt unable to doff socks. When provided sock, Pt able to put on her R foot, but could not put it on L. Pt is flexible and able to perform figure 4 Toilet Transfer: Minimal assistance, Cueing for safety, Ambulation Toilet Transfer Details (indicate cue type and reason): 1 person HHA Toileting- Clothing Manipulation and Hygiene: Maximal assistance, Sit to/from stand Toileting - Clothing Manipulation Details (indicate cue type and reason): Pt wet and unaware Functional mobility during ADLs: Minimal assistance, Cueing for safety, Cueing for sequencing (1 person hand held assist)  Cognition: Cognition Orientation Level: Disoriented to time, Oriented to person, Oriented to place, Oriented to situation Cognition Arousal: Alert Behavior During Therapy: Flat affect  Blood pressure 102/72, pulse 67, temperature (!) 97.5 F (36.4 C), temperature source Oral, resp. rate 16, height 4\' 11"  (1.499 m), weight 49.6 kg, SpO2 93%. Physical Exam Gen: no distress, normal appearing HEENT: oral mucosa pink and moist, NCAT Cardio: Reg rate Chest: normal effort, normal rate of breathing Abd: soft, non-distended Ext: no edema Psych:  pleasant, normal affect Skin: intact Neuro: Alert and oriented x3, mild left sided weakness with facial droop. +left sided visual field cut, somnolent, sensation intact  Results for orders placed or performed during the hospital encounter of 10/08/23 (from the past 24 hours)  Glucose, capillary     Status: None   Collection Time: 10/10/23 11:41 AM  Result Value Ref Range   Glucose-Capillary 96 70 - 99 mg/dL  Glucose, capillary     Status: None   Collection Time: 10/10/23  4:20 PM  Result Value Ref Range   Glucose-Capillary 74 70 - 99 mg/dL   Comment 1 Notify RN    Comment 2 Document in Chart   Glucose,  capillary     Status: Abnormal   Collection Time: 10/10/23  9:11 PM  Result Value Ref Range   Glucose-Capillary 136 (H) 70 - 99 mg/dL   Comment 1 Notify RN    Comment 2 Document in Chart   Glucose, capillary     Status: Abnormal   Collection Time: 10/11/23  6:31 AM  Result Value Ref Range   Glucose-Capillary 109 (H) 70 - 99 mg/dL   Comment 1 Notify RN    Comment 2 Document in Chart    MR BRAIN WO CONTRAST Result Date: 10/09/2023 CLINICAL DATA:  Stroke, follow up EXAM: MRI HEAD WITHOUT CONTRAST TECHNIQUE: Multiplanar, multiecho pulse sequences of the brain and surrounding structures were obtained without intravenous contrast. COMPARISON:  CT head October 08, 2023. MRI head November 26, 23. FINDINGS: Significantly motion limited study. Brain: Multiple punctate foci of restricted diffusion in bilateral frontal lobes and the left parietal lobe. No evidence of acute hemorrhage, mass lesion, midline shift or hydrocephalus. Cerebral atrophy. Vascular: Major arterial flow voids are maintained at the skull base. Skull and upper cervical spine: Normal marrow signal. Sinuses/Orbits: Mostly clear sinuses. IMPRESSION: 1. Findings compatible with multiple punctate acute infarcts in bilateral frontal and left parietal lobes. Given involvement of multiple vascular territories, consider an embolic etiology. 2. Significantly motion limited study. Electronically Signed   By: Feliberto Harts M.D.   On: 10/09/2023 22:53   EEG adult Result Date: 10/09/2023 Charlsie Quest, MD     10/09/2023  4:41 PM Patient Name: SCHARLENE CATALINA MRN: [id number] Epilepsy Attending: Charlsie Quest Referring Physician/Provider: Mathews Argyle, NP Date: 10/09/2023 Duration: 22.07 mins Patient history: 73 year old lady presented with sudden onset of speech difficulties and left hemiparesis. EEG to evaluate for seizure Level of alertness: comatose/ lethargic AEDs during EEG study: None Technical aspects: This EEG study was done with  scalp electrodes positioned according to the 10-20 International system of electrode placement. Electrical activity was reviewed with band pass filter of 1-70Hz , sensitivity of 7 uV/mm, display speed of 24mm/sec with a 60Hz  notched filter applied as appropriate. EEG data were recorded continuously and digitally stored.  Video monitoring was available and reviewed as appropriate. Description: EEG showed continuous generalized polymorphic 3 to 6 Hz theta-delta slowing. Hyperventilation and photic stimulation were not performed.   ABNORMALITY - Continuous slow, generalized IMPRESSION: This study is suggestive of moderate diffuse encephalopathy. No seizures or epileptiform discharges were seen throughout the recording. Priyanka Annabelle Harman    Assessment/Plan: Diagnosis: Bilateral frontal and left parietal embolic infarcts Does the need for close, 24 hr/day medical supervision in concert with the patient's rehab needs make it unreasonable for this patient to be served in a less intensive setting? Yes Co-Morbidities requiring supervision/potential complications:  1) HTN, HLD, prediabetes, hypothyroidism, ADHD, B12 deficiency, mild  cognitive impairment, dysphagia Due to bladder management, bowel management, safety, skin/wound care, disease management, medication administration, pain management, and patient education, does the patient require 24 hr/day rehab nursing? Yes Does the patient require coordinated care of a physician, rehab nurse, therapy disciplines of PT, OT, SLP to address physical and functional deficits in the context of the above medical diagnosis(es)? Yes Addressing deficits in the following areas: balance, endurance, locomotion, strength, transferring, bowel/bladder control, bathing, dressing, feeding, grooming, toileting, and cognition Can the patient actively participate in an intensive therapy program of at least 3 hrs of therapy per day at least 5 days per week? Yes The potential for patient to  make measurable gains while on inpatient rehab is excellent Anticipated functional outcomes upon discharge from inpatient rehab are supervision  with PT, supervision with OT, supervision with SLP. Estimated rehab length of stay to reach the above functional goals is: 10-14 days Anticipated discharge destination: Home Overall Rehab/Functional Prognosis: excellent  POST ACUTE RECOMMENDATIONS: This patient's condition is appropriate for continued rehabilitative care in the following setting: CIR Patient has agreed to participate in recommended program. Yes Note that insurance prior authorization may be required for reimbursement for recommended care.    I have personally performed a face to face diagnostic evaluation of this patient. Additionally, I have examined the patient's medical record including any pertinent labs and radiographic images.    Thanks,  Horton Chin, MD 10/11/2023

## 2023-10-11 NOTE — Progress Notes (Addendum)
 STROKE TEAM PROGRESS NOTE    Received TNK 3/23 1800  INTERIM HISTORY/SUBJECTIVE  Husband is at the bedside.  No new neurological events overnight Patient is awake alert and following commands and speech is improving Neurological exam is improving  OBJECTIVE  CBC    Component Value Date/Time   WBC 5.6 10/10/2023 0911   RBC 5.12 (H) 10/10/2023 0911   HGB 14.4 10/10/2023 0911   HCT 42.6 10/10/2023 0911   PLT 147 (L) 10/10/2023 0911   MCV 83.2 10/10/2023 0911   MCH 28.1 10/10/2023 0911   MCHC 33.8 10/10/2023 0911   RDW 12.5 10/10/2023 0911   LYMPHSABS 2.6 10/08/2023 1710   MONOABS 0.5 10/08/2023 1710   EOSABS 0.0 10/08/2023 1710   BASOSABS 0.0 10/08/2023 1710    BMET    Component Value Date/Time   NA 134 (L) 10/10/2023 0911   K 4.0 10/10/2023 0911   CL 100 10/10/2023 0911   CO2 20 (L) 10/10/2023 0911   GLUCOSE 97 10/10/2023 0911   BUN 13 10/10/2023 0911   CREATININE 0.63 10/10/2023 0911   CREATININE 0.80 02/05/2020 0857   CALCIUM 8.9 10/10/2023 0911   GFRNONAA >60 10/10/2023 0911   GFRNONAA 74 02/05/2020 0857    IMAGING past 24 hours No results found.   Vitals:   10/10/23 1810 10/10/23 1927 10/10/23 2316 10/11/23 0745  BP: 127/80 106/63 110/79 102/72  Pulse:  76 74 67  Resp:  18 17 16   Temp:  98.4 F (36.9 C) 98.3 F (36.8 C) (!) 97.5 F (36.4 C)  TempSrc:  Oral Oral Oral  SpO2:  96% 94% 93%  Weight:      Height:         PHYSICAL EXAM General: Well-developed well-nourished female in no apparent distress Psych: Unable to assess CV: Regular rate and rhythm on monitor Respiratory:  Regular, unlabored respirations on room air GI: Abdomen soft and nontender   NEURO:  Mental Status: Awake alert and oriented.  She is oriented to self and to place.  She follows simple commands. Speech/language: No dysarthria or aphasia noted.    Cranial Nerves:  II: PERRL. Decreased left visual field.  III, IV, VI: EOMI. Eyelids elevate symmetrically.  V:  Sensation is intact to light touch and symmetrical to face.  VII: Left facial droop VIII: hearing intact to voice. IX, X: Palate elevates symmetrically. Phonation is normal.  IO:[id number] shrug 5/5. XII: tongue is midline without fasciculations. Motor:  Generalized weakness with no drift.  No focal deficits Tone: is normal and bulk is normal Sensation- no deficits seen. Coordination: Unable to assess Gait- deferred  Most Recent NIH 2  ASSESSMENT/PLAN  Gabrielle Vargas is a 73 y.o. female with history of ADHD, HTN, diverticulitis, HLD, hypothyroidism, low back pain, cervical neck fusion, mild cognitive impairment, low back pain radiating to right leg, mild intermittent asthma, osteoarthritis, right knee pain and vitamin B12 deficiency who presents to the ED from home  admitted for acute onset of left sided weakness and dysphasia.  Received IV TNK at 1800 on 3/23.  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 ASPECTS 10.    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 No significant proximal stenosis, aneurysm, or branch vessel occlusion within the Circle of Willis. Minimal atherosclerotic change at the proximal left ICA without significant stenosis. Aortic atherosclerosis  24-hour CT head: Chronic atrophy changes, no acute abnormality, no hemorrhage. MRI: Multiple punctate acute infarcts bilateral frontal and left parietal lobes Routine EEG: Suggestive of moderate diffuse encephalopathy. No seizures or epileptiform discharges were seen throughout the recording  2D Echo EF 30 to 35%.  LV with grade 1 diastolic dysfunction-cardiology following Will likely need loop recorder on discharge LDL 80 HgbA1c 6.2 VTE prophylaxis -SCDs No antithrombotic prior to admission, now on aspirin 81 mg and Plavix 75 mg for 3  weeks, then aspirin alone.  Discussed with cardiology no need for anticoagulation at this time Therapy recommendations:  CIR Disposition: Pending  Hypertension CHF Home meds: HCTZ 25 mg Stable Blood Pressure Goal: BP less than 180/105  2D Echo EF 30 to 35% Cardiology following begin GDMT.  Noted on Toprol XL 25 mg today  Hyperlipidemia Home meds: Simvastatin 20 mg, Zetia 10 mg,  resumed in hospital LDL 80, goal < 70 Change simvastatin to atorvastatin 40 mg Continue statin at discharge  Diabetes melitis controlled  A1c 6.2 On metformin at home SSI Close follow-up with primary care  Hypothyroidism Continue home levothyroxine 75 mcg  Other Stroke Risk Factors Congestive heart failure  Other Active Problems ADHD on Adderall B12 deficiency Mild cognitive impairment  Hospital day # 3   Pt seen by Neuro NP/APP and later by MD. Note/plan to be edited by MD as needed.      Gevena Mart DNP, ACNPC-AG  [facility]  I have personally obtained history,examined this patient, reviewed notes, independently viewed imaging studies, participated in medical decision making and plan of care.ROS completed by me personally and pertinent positives fully documented  I have made any additions or clarifications directly to the above note. Agree with note above.  Patient does show neurological improvement but still has mild slurred speech and facial weakness.  Mobilize out of bed.  Physical occupational speech therapy consults.  Appreciate cardiology help and will start goal-directed medical therapy for her new heart failure.  She will need loop recorder at discharge to look for paroxysmal A-fib.  Continue dual antiplatelet therapy for 3 weeks and then aspirin alone.  Discussed with cardiology.  Discussed with patient's family at the bedside and answered questions.  Greater than 50% time during this 35-minute visit was spent in counseling and coordination of care and discussion patient  and care team answering questions.  Delia Heady, MD Medical Director [facility] Pager: [phone] 10/11/2023 2:56 PM \   To contact Stroke Continuity provider, please refer to [email or web address]. After hours, contact General Neurology

## 2023-10-12 ENCOUNTER — Telehealth: Payer: Self-pay | Admitting: Adult Health

## 2023-10-12 ENCOUNTER — Encounter (HOSPITAL_COMMUNITY)
Admission: EM | Disposition: A | Discharge status: Rehabilitation Facility | Payer: Self-pay | Source: Home / Self Care | Attending: Neurology

## 2023-10-12 ENCOUNTER — Inpatient Hospital Stay (HOSPITAL_COMMUNITY)
Admission: AD | Admit: 2023-10-12 | Discharge: 2023-10-20 | DRG: 056 | Disposition: A | Discharge status: Home / Self Care | Source: Intra-hospital | Attending: Physical Medicine & Rehabilitation | Admitting: Physical Medicine & Rehabilitation

## 2023-10-12 DIAGNOSIS — I69392 Facial weakness following cerebral infarction: Secondary | ICD-10-CM

## 2023-10-12 DIAGNOSIS — Z79899 Other long term (current) drug therapy: Secondary | ICD-10-CM | POA: Diagnosis not present

## 2023-10-12 DIAGNOSIS — K5900 Constipation, unspecified: Secondary | ICD-10-CM | POA: Diagnosis not present

## 2023-10-12 DIAGNOSIS — I4729 Other ventricular tachycardia: Secondary | ICD-10-CM | POA: Diagnosis not present

## 2023-10-12 DIAGNOSIS — R059 Cough, unspecified: Secondary | ICD-10-CM | POA: Diagnosis present

## 2023-10-12 DIAGNOSIS — I491 Atrial premature depolarization: Secondary | ICD-10-CM | POA: Diagnosis present

## 2023-10-12 DIAGNOSIS — I69818 Other symptoms and signs involving cognitive functions following other cerebrovascular disease: Secondary | ICD-10-CM

## 2023-10-12 DIAGNOSIS — Z9889 Other specified postprocedural states: Secondary | ICD-10-CM

## 2023-10-12 DIAGNOSIS — I700 Atherosclerosis of aorta: Secondary | ICD-10-CM | POA: Diagnosis not present

## 2023-10-12 DIAGNOSIS — I69398 Other sequelae of cerebral infarction: Secondary | ICD-10-CM | POA: Diagnosis not present

## 2023-10-12 DIAGNOSIS — I63411 Cerebral infarction due to embolism of right middle cerebral artery: Secondary | ICD-10-CM | POA: Diagnosis not present

## 2023-10-12 DIAGNOSIS — I63133 Cerebral infarction due to embolism of bilateral carotid arteries: Secondary | ICD-10-CM

## 2023-10-12 DIAGNOSIS — J4520 Mild intermittent asthma, uncomplicated: Secondary | ICD-10-CM | POA: Diagnosis not present

## 2023-10-12 DIAGNOSIS — I5041 Acute combined systolic (congestive) and diastolic (congestive) heart failure: Secondary | ICD-10-CM | POA: Diagnosis not present

## 2023-10-12 DIAGNOSIS — Z98891 History of uterine scar from previous surgery: Secondary | ICD-10-CM

## 2023-10-12 DIAGNOSIS — Z803 Family history of malignant neoplasm of breast: Secondary | ICD-10-CM | POA: Diagnosis not present

## 2023-10-12 DIAGNOSIS — Z91040 Latex allergy status: Secondary | ICD-10-CM

## 2023-10-12 DIAGNOSIS — E538 Deficiency of other specified B group vitamins: Secondary | ICD-10-CM | POA: Diagnosis not present

## 2023-10-12 DIAGNOSIS — Z885 Allergy status to narcotic agent status: Secondary | ICD-10-CM

## 2023-10-12 DIAGNOSIS — F909 Attention-deficit hyperactivity disorder, unspecified type: Secondary | ICD-10-CM | POA: Diagnosis not present

## 2023-10-12 DIAGNOSIS — I6340 Cerebral infarction due to embolism of unspecified cerebral artery: Secondary | ICD-10-CM

## 2023-10-12 DIAGNOSIS — Z79890 Hormone replacement therapy (postmenopausal): Secondary | ICD-10-CM | POA: Diagnosis not present

## 2023-10-12 DIAGNOSIS — M1990 Unspecified osteoarthritis, unspecified site: Secondary | ICD-10-CM | POA: Diagnosis present

## 2023-10-12 DIAGNOSIS — I69354 Hemiplegia and hemiparesis following cerebral infarction affecting left non-dominant side: Secondary | ICD-10-CM

## 2023-10-12 DIAGNOSIS — I639 Cerebral infarction, unspecified: Secondary | ICD-10-CM | POA: Diagnosis not present

## 2023-10-12 DIAGNOSIS — E119 Type 2 diabetes mellitus without complications: Secondary | ICD-10-CM | POA: Diagnosis present

## 2023-10-12 DIAGNOSIS — I69393 Ataxia following cerebral infarction: Secondary | ICD-10-CM

## 2023-10-12 DIAGNOSIS — Z7984 Long term (current) use of oral hypoglycemic drugs: Secondary | ICD-10-CM | POA: Diagnosis not present

## 2023-10-12 DIAGNOSIS — Z818 Family history of other mental and behavioral disorders: Secondary | ICD-10-CM

## 2023-10-12 DIAGNOSIS — E039 Hypothyroidism, unspecified: Secondary | ICD-10-CM | POA: Diagnosis not present

## 2023-10-12 DIAGNOSIS — E785 Hyperlipidemia, unspecified: Secondary | ICD-10-CM | POA: Diagnosis not present

## 2023-10-12 DIAGNOSIS — D696 Thrombocytopenia, unspecified: Secondary | ICD-10-CM | POA: Diagnosis present

## 2023-10-12 DIAGNOSIS — Z8616 Personal history of COVID-19: Secondary | ICD-10-CM | POA: Diagnosis not present

## 2023-10-12 DIAGNOSIS — Z7902 Long term (current) use of antithrombotics/antiplatelets: Secondary | ICD-10-CM

## 2023-10-12 DIAGNOSIS — Z7982 Long term (current) use of aspirin: Secondary | ICD-10-CM

## 2023-10-12 DIAGNOSIS — Z8249 Family history of ischemic heart disease and other diseases of the circulatory system: Secondary | ICD-10-CM

## 2023-10-12 DIAGNOSIS — E876 Hypokalemia: Secondary | ICD-10-CM | POA: Diagnosis not present

## 2023-10-12 DIAGNOSIS — Z90710 Acquired absence of both cervix and uterus: Secondary | ICD-10-CM

## 2023-10-12 DIAGNOSIS — Z8719 Personal history of other diseases of the digestive system: Secondary | ICD-10-CM

## 2023-10-12 DIAGNOSIS — Z808 Family history of malignant neoplasm of other organs or systems: Secondary | ICD-10-CM

## 2023-10-12 DIAGNOSIS — Z833 Family history of diabetes mellitus: Secondary | ICD-10-CM

## 2023-10-12 DIAGNOSIS — Z841 Family history of disorders of kidney and ureter: Secondary | ICD-10-CM

## 2023-10-12 DIAGNOSIS — I69320 Aphasia following cerebral infarction: Secondary | ICD-10-CM | POA: Diagnosis not present

## 2023-10-12 DIAGNOSIS — Z981 Arthrodesis status: Secondary | ICD-10-CM

## 2023-10-12 DIAGNOSIS — Z888 Allergy status to other drugs, medicaments and biological substances status: Secondary | ICD-10-CM

## 2023-10-12 DIAGNOSIS — Z9089 Acquired absence of other organs: Secondary | ICD-10-CM

## 2023-10-12 DIAGNOSIS — I110 Hypertensive heart disease with heart failure: Secondary | ICD-10-CM | POA: Diagnosis present

## 2023-10-12 DIAGNOSIS — I69391 Dysphagia following cerebral infarction: Secondary | ICD-10-CM | POA: Diagnosis not present

## 2023-10-12 HISTORY — DX: Other ventricular tachycardia: I47.29

## 2023-10-12 HISTORY — PX: LOOP RECORDER INSERTION: EP1214

## 2023-10-12 LAB — BASIC METABOLIC PANEL WITH GFR
Anion gap: 11 (ref 5–15)
BUN: 13 mg/dL (ref 8–23)
CO2: 23 mmol/L (ref 22–32)
Calcium: 9.2 mg/dL (ref 8.9–10.3)
Chloride: 101 mmol/L (ref 98–111)
Creatinine, Ser: 0.68 mg/dL (ref 0.44–1.00)
GFR, Estimated: >60 mL/min (ref >60)
Glucose, Bld: 103 mg/dL — ABNORMAL HIGH (ref 70–99)
Potassium: 3.4 mmol/L — ABNORMAL LOW (ref 3.5–5.1)
Sodium: 135 mmol/L (ref 135–145)

## 2023-10-12 LAB — GLUCOSE, CAPILLARY
Glucose-Capillary: 96 mg/dL (ref 70–99)
Glucose-Capillary: 188 mg/dL — ABNORMAL HIGH (ref 70–99)
Glucose-Capillary: 103 mg/dL — ABNORMAL HIGH (ref 70–99)
Glucose-Capillary: 122 mg/dL — ABNORMAL HIGH (ref 70–99)
Glucose-Capillary: 117 mg/dL — ABNORMAL HIGH (ref 70–99)

## 2023-10-12 SURGERY — LOOP RECORDER INSERTION

## 2023-10-12 MED ORDER — ASPIRIN 300 MG RE SUPP
300.0000 mg | Freq: Every day | RECTAL | Status: DC
  Filled 2023-10-12 (×4): qty 1

## 2023-10-12 MED ORDER — VITAMIN B-12 1000 MCG PO TABS
1000.0000 ug | ORAL_TABLET | ORAL | Status: DC
  Administered 2023-10-16 – 2023-10-19 (×2): 1000 ug via ORAL
  Filled 2023-10-12 (×2): qty 1

## 2023-10-12 MED ORDER — MELATONIN 5 MG PO TABS: 5.0000 mg | ORAL_TABLET | Freq: Every evening | ORAL | Status: DC | PRN

## 2023-10-12 MED ORDER — GUAIFENESIN-DM 100-10 MG/5ML PO SYRP: 10.0000 mL | ORAL_SOLUTION | Freq: Four times a day (QID) | ORAL | Status: DC | PRN

## 2023-10-12 MED ORDER — LOSARTAN POTASSIUM 25 MG PO TABS
12.5000 mg | ORAL_TABLET | Freq: Every day | ORAL | Status: DC
Start: 2023-10-13 — End: 2023-10-19

## 2023-10-12 MED ORDER — POTASSIUM CHLORIDE CRYS ER 20 MEQ PO TBCR
40.0000 meq | EXTENDED_RELEASE_TABLET | Freq: Once | ORAL | Status: AC
  Administered 2023-10-12: 40 meq via ORAL
  Filled 2023-10-12: qty 2

## 2023-10-12 MED ORDER — METOPROLOL SUCCINATE ER 25 MG PO TB24: 25.0000 mg | ORAL_TABLET | Freq: Every day | ORAL | Status: DC

## 2023-10-12 MED ORDER — ENOXAPARIN SODIUM 40 MG/0.4ML IJ SOSY
40.0000 mg | PREFILLED_SYRINGE | INTRAMUSCULAR | Status: DC
  Administered 2023-10-13 – 2023-10-16 (×4): 40 mg via SUBCUTANEOUS
  Filled 2023-10-12 (×4): qty 0.4

## 2023-10-12 MED ORDER — LOSARTAN POTASSIUM 25 MG PO TABS
12.5000 mg | ORAL_TABLET | Freq: Every day | ORAL | Status: DC
  Administered 2023-10-12: 12.5 mg via ORAL
  Filled 2023-10-12: qty 1

## 2023-10-12 MED ORDER — LEVOTHYROXINE SODIUM 75 MCG PO TABS
75.0000 ug | ORAL_TABLET | Freq: Every day | ORAL | Status: DC
  Administered 2023-10-13 – 2023-10-20 (×8): 75 ug via ORAL
  Filled 2023-10-12 (×8): qty 1

## 2023-10-12 MED ORDER — SENNOSIDES-DOCUSATE SODIUM 8.6-50 MG PO TABS: 1.0000 | ORAL_TABLET | Freq: Every evening | ORAL | Status: DC | PRN

## 2023-10-12 MED ORDER — CLOPIDOGREL BISULFATE 75 MG PO TABS
75.0000 mg | ORAL_TABLET | Freq: Every day | ORAL | Status: DC
  Administered 2023-10-13 – 2023-10-20 (×8): 75 mg via ORAL
  Filled 2023-10-12 (×8): qty 1

## 2023-10-12 MED ORDER — METOPROLOL SUCCINATE ER 25 MG PO TB24
25.0000 mg | ORAL_TABLET | Freq: Every day | ORAL | Status: DC
  Administered 2023-10-13 – 2023-10-20 (×8): 25 mg via ORAL
  Filled 2023-10-12 (×8): qty 1

## 2023-10-12 MED ORDER — POLYETHYLENE GLYCOL 3350 17 G PO PACK: 17.0000 g | PACK | Freq: Every day | ORAL | Status: DC | PRN

## 2023-10-12 MED ORDER — ALUM & MAG HYDROXIDE-SIMETH 200-200-20 MG/5ML PO SUSP: 30.0000 mL | ORAL | Status: DC | PRN

## 2023-10-12 MED ORDER — ASPIRIN 81 MG PO CHEW: 81.0000 mg | CHEWABLE_TABLET | Freq: Every day | ORAL | Status: DC

## 2023-10-12 MED ORDER — ACETAMINOPHEN 325 MG PO TABS
325.0000 mg | ORAL_TABLET | ORAL | Status: DC | PRN
  Administered 2023-10-13: 650 mg via ORAL
  Filled 2023-10-12: qty 2

## 2023-10-12 MED ORDER — ONDANSETRON HCL 4 MG/2ML IJ SOLN: 4.0000 mg | Freq: Four times a day (QID) | INTRAMUSCULAR | Status: DC | PRN

## 2023-10-12 MED ORDER — LIDOCAINE-EPINEPHRINE 1 %-1:100000 IJ SOLN
INTRAMUSCULAR | Status: AC
  Filled 2023-10-12: qty 1

## 2023-10-12 MED ORDER — DIPHENHYDRAMINE HCL 25 MG PO CAPS: 25.0000 mg | ORAL_CAPSULE | Freq: Four times a day (QID) | ORAL | Status: DC | PRN

## 2023-10-12 MED ORDER — ATORVASTATIN CALCIUM 40 MG PO TABS: 40.0000 mg | ORAL_TABLET | Freq: Every day | ORAL | Status: DC

## 2023-10-12 MED ORDER — LIDOCAINE-EPINEPHRINE 1 %-1:100000 IJ SOLN
INTRAMUSCULAR | Status: DC | PRN
  Administered 2023-10-12: 10 mL

## 2023-10-12 MED ORDER — LOSARTAN POTASSIUM 25 MG PO TABS
12.5000 mg | ORAL_TABLET | Freq: Every day | ORAL | Status: DC
  Administered 2023-10-13 – 2023-10-20 (×8): 12.5 mg via ORAL
  Filled 2023-10-12 (×8): qty 1

## 2023-10-12 MED ORDER — ENOXAPARIN SODIUM 40 MG/0.4ML IJ SOSY: 40.0000 mg | PREFILLED_SYRINGE | INTRAMUSCULAR | Status: DC

## 2023-10-12 MED ORDER — ONDANSETRON HCL 4 MG PO TABS: 4.0000 mg | ORAL_TABLET | Freq: Four times a day (QID) | ORAL | Status: DC | PRN

## 2023-10-12 MED ORDER — BISACODYL 5 MG PO TBEC: 5.0000 mg | DELAYED_RELEASE_TABLET | Freq: Every day | ORAL | Status: DC | PRN

## 2023-10-12 MED ORDER — FLEET ENEMA RE ENEM: 1.0000 | ENEMA | Freq: Once | RECTAL | Status: DC | PRN

## 2023-10-12 MED ORDER — METHOCARBAMOL 500 MG PO TABS: 500.0000 mg | ORAL_TABLET | Freq: Four times a day (QID) | ORAL | Status: DC | PRN

## 2023-10-12 MED ORDER — ATORVASTATIN CALCIUM 40 MG PO TABS
40.0000 mg | ORAL_TABLET | Freq: Every day | ORAL | Status: DC
  Administered 2023-10-13 – 2023-10-20 (×8): 40 mg via ORAL
  Filled 2023-10-12 (×8): qty 1

## 2023-10-12 MED ORDER — CLOPIDOGREL BISULFATE 75 MG PO TABS: 75.0000 mg | ORAL_TABLET | Freq: Every day | ORAL | Status: DC

## 2023-10-12 MED ORDER — ASPIRIN 81 MG PO CHEW
81.0000 mg | CHEWABLE_TABLET | Freq: Every day | ORAL | Status: DC
  Administered 2023-10-13 – 2023-10-20 (×8): 81 mg via ORAL
  Filled 2023-10-12 (×8): qty 1

## 2023-10-12 SURGICAL SUPPLY — 2 items
MONITOR REVEAL LINQ II (Prosthesis & Implant Heart) IMPLANT
PACK LOOP INSERTION (CUSTOM PROCEDURE TRAY) ×2 IMPLANT

## 2023-10-12 NOTE — Evaluation (Signed)
 Speech Language Pathology Evaluation Patient Details Name: Gabrielle Vargas MRN: [id number] DOB: 17-Nov-1950 Today's Date: 10/12/2023 Time: 9147-8295 SLP Time Calculation (min) (ACUTE ONLY): 29 min  Problem List:  Patient Active Problem List   Diagnosis Date Noted   NSVT (nonsustained ventricular tachycardia) (HCC) 10/12/2023   Acute combined systolic and diastolic heart failure (HCC) 10/11/2023   HFrEF (heart failure with reduced ejection fraction) (HCC) 10/10/2023   Acute ischemic stroke (HCC) 10/08/2023   MCI (mild cognitive impairment) 08/04/2022   Vitamin B12 deficiency    Osteoarthritis 08/03/2022   Hyperlipidemia 08/03/2022   History of COVID-19 2021   Essential hypertension 10/09/2017   Low back pain radiating to right leg 10/09/2017   Mild intermittent asthma with acute exacerbation 05/30/2017   S/P arthroscopy of right knee 06/03/2014   Right knee pain 03/13/2014   Diverticulitis of colon (without mention of hemorrhage) 09/17/2013   Postmenopausal atrophic vaginitis, severe 08/18/2008   Hypothyroidism 05/09/2007   ADHD (attention deficit hyperactivity disorder) 05/09/2007   Past Medical History:  Past Medical History:  Diagnosis Date   ADHD (attention deficit hyperactivity disorder) 05/09/2007   Diverticulitis of colon (without mention of hemorrhage) 09/17/2013   Essential hypertension 10/09/2017   History of COVID-19 2021   Hyperlipidemia    Hypothyroidism    Low back pain radiating to right leg 10/09/2017   Mild cognitive impairment of uncertain or unknown etiology 08/04/2022   Mild intermittent asthma with acute exacerbation 05/30/2017   Osteoarthritis    Postmenopausal atrophic vaginitis, severe 08/18/2008   Right knee pain 03/13/2014   S/P arthroscopy of right knee 06/03/2014   Vitamin B12 deficiency    Past Surgical History:  Past Surgical History:  Procedure Laterality Date   ABDOMINAL HYSTERECTOMY     BREAST SURGERY     CARPAL TUNNEL RELEASE  Bilateral    CERVICAL DISC ARTHROPLASTY     CESAREAN SECTION     x2   left shoulder surgery     MENISCUS REPAIR Right 2017   REDUCTION MAMMAPLASTY Bilateral    right shoulder     TONSILLECTOMY     HPI:  AMYRIAH BURAS is a 73 y.o. female with a PMHx of ADHD, HTN, diverticulitis, HLD, hypothyroidism, low back pain, cervical neck fusion, mild cognitive impairment, low back pain radiating to right leg, mild intermittent asthma, osteoarthritis, right knee pain and vitamin B12 deficiency who presents to the ED on 10/08/23 from home via EMS as a Code Stroke for acute onset of left sided weakness and dysphasia. She had sudden onset of L facial droop and speech difficulty with onset witnessed by her husband at 52. When EMS arrived they noted L facial droop, aphasia, L arm and leg weakness and L side visual cut. 3/24 MRI revealed findings compatible with multiple punctate acute infarcts in  bilateral frontal and left parietal lobes. ST consulted for speech/language cognitive evaluation.   Assessment / Plan / Recommendation Clinical Impression  Pt seen for speech/language cognitive assessment with [employer] mental Status Examination (SLUMS) administered with a cumulative score of 19/30 achieved with a typical score being 27/30.  Deficits noted included: convergent/divergent naming, thought organization, decreased sustained attention (hx of ADHD), memory recall, and slightly decreased auditory processing.  Pt was Ox4 and speech intelligible within simple conversation, although her husband stated it deteriorates "when she is tired."  Simple calculation task, word fluency, memory recall with objects after a time delay and reversal of digit recall all impacted by current status.  Pt achieved  100% accuracy with paragraph retention and constructed clock with extra time requirement, but adequate for hand placement and spatial awareness.  Husband noted mild cognitive impairment c/b anomia/decreased  STM intermittently prior to CVA.  OME appeared adequate with oral motor tasks and pt denies dysphagia.  Recommend ST f/u for cognitive/linguistic changes and ongoing assessment of speech intelligibility during acute stay.  Thank you for this consult.    SLP Assessment  SLP Recommendation/Assessment: Patient needs continued Speech Language Pathology Services SLP Visit Diagnosis: Aphasia (R47.01);Attention and concentration deficit;Cognitive communication deficit (R41.841) Attention and concentration deficit following: Cerebral infarction    Recommendations for follow up therapy are one component of a multi-disciplinary discharge planning process, led by the attending physician.  Recommendations may be updated based on patient status, additional functional criteria and insurance authorization.    Follow Up Recommendations  Acute inpatient rehab (3hours/day)    Assistance Recommended at Discharge  Other (comment) (TBD)  Functional Status Assessment Patient has had a recent decline in their functional status and demonstrates the ability to make significant improvements in function in a reasonable and predictable amount of time.  Frequency and Duration min 2x/week  1 week      SLP Evaluation Cognition  Overall Cognitive Status: Impaired/Different from baseline Arousal/Alertness: Awake/alert Orientation Level: Oriented X4 Year: 2025 Month: March Day of Week: Correct Attention: Sustained Sustained Attention: Impaired Sustained Attention Impairment: Verbal basic;Functional basic Memory: Impaired Memory Impairment: Retrieval deficit;Decreased recall of new information;Decreased short term memory;Other (comment) Decreased Short Term Memory: Verbal basic;Functional basic Awareness: Appears intact Problem Solving: Appears intact Executive Function: Organizing Organizing: Impaired Organizing Impairment: Verbal basic;Functional basic Behaviors: Perseveration Safety/Judgment: Appears intact        Comprehension  Auditory Comprehension Overall Auditory Comprehension: Appears within functional limits for tasks assessed Commands: Not tested Conversation: Simple Interfering Components: Attention;Working Radio broadcast assistant: Repetition Counsellor: Within [employer] Reading Comprehension Reading Status: Within funtional limits (for simple environmental signs only)    Expression Expression Primary Mode of Expression: Verbal Verbal Expression Overall Verbal Expression: Impaired Level of Generative/Spontaneous Verbalization: Conversation Naming: Impairment Responsive: 51-75% accurate Confrontation: Not tested Convergent: 25-49% accurate Divergent: 25-49% accurate Verbal Errors: Perseveration;Aware of errors Pragmatics: No impairment Interfering Components: Attention;Premorbid deficit Non-Verbal Means of Communication: Not applicable Written Expression Dominant Hand: Right Written Expression: Exceptions to [facility] Interfering Components: Thought organization;Attention   Oral / Motor  Oral Motor/Sensory Function Overall Oral Motor/Sensory Function: Within functional limits Motor Speech Overall Motor Speech: Appears within functional limits for tasks assessed Respiration: Within functional limits Phonation: Normal Resonance: Within functional limits Articulation: Within functional limitis Intelligibility: Intelligible Motor Planning: Witnin functional limits Motor Speech Errors: Not applicable Interfering Components: Premorbid status            Pat Emidio Warrell,M.S.,CCC-SLP 10/12/2023, 10:39 AM

## 2023-10-12 NOTE — Progress Notes (Signed)
 Horton Chin, MD  Physician Physical Medicine and Rehabilitation   Consult Note    Signed   Date of Service: 10/11/2023 10:51 AM  Related encounter: ED to Hosp-Admission (Current) from 10/08/2023 in [facility]   Signed     Expand All Collapse All  Show:Clear all [x] Written[x] Templated[] Copied  Added by: [x] Raulkar, Drema Pry, MD  [] Hover for details          Physical Medicine and Rehabilitation Consult Reason for Consult: Acute ischemic CVA Referring Physician: Stroke MD     HPI: Gabrielle Vargas is a 73 y.o. female with a history of HTN, HLD, prediabetes, hypothyroidism, ADHD, new HFrEF, who presented with an acute ischemic right MCA infarct s/p TNK. She was started on aspirin and Plavix. Deficits include new onset left sided weakness and dysphagia. Husband has noted improved function and states she was able to ambulate today.     ROS +left sided weakness and dsyphagia     Past Medical History:  Diagnosis Date   ADHD (attention deficit hyperactivity disorder) 05/09/2007   Diverticulitis of colon (without mention of hemorrhage) 09/17/2013   Essential hypertension 10/09/2017   History of COVID-19 2021   Hyperlipidemia     Hypothyroidism     Low back pain radiating to right leg 10/09/2017   Mild cognitive impairment of uncertain or unknown etiology 08/04/2022   Mild intermittent asthma with acute exacerbation 05/30/2017   Osteoarthritis     Postmenopausal atrophic vaginitis, severe 08/18/2008   Right knee pain 03/13/2014   S/P arthroscopy of right knee 06/03/2014   Vitamin B12 deficiency               Past Surgical History:  Procedure Laterality Date   ABDOMINAL HYSTERECTOMY       BREAST SURGERY       CARPAL TUNNEL RELEASE Bilateral     CERVICAL DISC ARTHROPLASTY       CESAREAN SECTION        x2   left shoulder surgery       MENISCUS REPAIR Right 2017   REDUCTION MAMMAPLASTY Bilateral     right shoulder        TONSILLECTOMY                 Family History  Problem Relation Age of Onset   Melanoma Mother     Kidney disease Father     Diabetes Father     Heart disease Father     Breast cancer Paternal Grandmother     ADD / ADHD Other          multiple family members   Colon cancer Neg Hx     Esophageal cancer Neg Hx     Rectal cancer Neg Hx     Stomach cancer Neg Hx          Social History:  reports that she has never smoked. She has never used smokeless tobacco. She reports that she does not drink alcohol and does not use drugs. Allergies:  Allergies      Allergies  Allergen Reactions   Oxycodone-Acetaminophen Nausea Only   Latex Hives and Rash            Medications Prior to Admission  Medication Sig Dispense Refill   Acetylcysteine (NAC) 500 MG CAPS Take 500 mg by mouth daily.       amphetamine-dextroamphetamine (ADDERALL) 20 MG tablet Take 1 tablet (20 mg total) by mouth 2 (two) times daily. (Patient  taking differently: Take 20 mg by mouth daily.) 60 tablet 0   Ascorbic Acid (VITAMIN C) 1000 MG tablet Take 1,000 mg by mouth daily.       benzonatate (TESSALON) 200 MG capsule Take 1 capsule (200 mg total) by mouth 3 (three) times daily as needed. 30 capsule 1   Bioflavonoid Products (QUERCETIN COMPLEX IMMUNE PO) Take 500 mg by mouth daily.       Cholecalciferol (D3-50 PO) Take 1 capsule by mouth daily.       Evening Primrose Oil 1000 MG CAPS Take 1,000 mg by mouth in the morning and at bedtime.       ezetimibe (ZETIA) 10 MG tablet TAKE 1 TABLET BY MOUTH DAILY (Patient taking differently: Take 10 mg by mouth at bedtime.) 90 tablet 3   hydrochlorothiazide (HYDRODIURIL) 25 MG tablet TAKE 1 TABLET BY MOUTH DAILY 90 tablet 1   metFORMIN (GLUCOPHAGE) 500 MG tablet TAKE 2 TABLETS(1000 MG) BY MOUTH TWICE DAILY WITH A MEAL 360 tablet 1   simvastatin (ZOCOR) 20 MG tablet TAKE 1 TABLET(20 MG) BY MOUTH EVERY OTHER DAY 45 tablet 3   SYNTHROID 75 MCG tablet TAKE 1 TABLET BY MOUTH DAILY 90  tablet 3   vitamin B-12 (CYANOCOBALAMIN) 1000 MCG tablet Take 1,000 mcg by mouth 2 (two) times a week.       Zinc 20 MG CAPS Take 20 mg by mouth daily.              Home: Home Living Family/patient expects to be discharged to:: Private residence Living Arrangements: Spouse/significant other Available Help at Discharge: Family Type of Home: House Home Access: Stairs to enter Secretary/administrator of Steps: 2 Entrance Stairs-Rails: None Home Layout: Multi-level, Able to live on main level with bedroom/bathroom Bathroom Shower/Tub: Health visitor:  (Comfort) Home Equipment: None  Functional History: Prior Function Prior Level of Function : Independent/Modified Independent, Driving Mobility Comments: no DME, walks 4-6 miles daily ADLs Comments: independent, does IADL Functional Status:  Mobility: Bed Mobility Overal bed mobility: Needs Assistance Bed Mobility: Supine to Sit Supine to sit: Mod assist, +2 for physical assistance General bed mobility comments: ModA +2 to transition hips and  legs EOB. Patient inconsistent with command following Transfers Overall transfer level: Needs assistance Equipment used: 2 person hand held assist Transfers: Sit to/from Stand Sit to Stand: Contact guard assist Bed to/from chair/wheelchair/BSC transfer type:: Step pivot Step pivot transfers: Min assist, +2 safety/equipment General transfer comment: CGA+2 to ensure safety with sit > stand. Patient uses LE support to power up from a seated position Ambulation/Gait Ambulation/Gait assistance: Contact guard assist, +2 safety/equipment Gait Distance (Feet): 150 Feet Assistive device: 2 person hand held assist Gait Pattern/deviations: Decreased stride length General Gait Details: CGA +2; Cues to increase cadence, slow processing when dual-tasking Gait velocity: Decreased   ADL: ADL Overall ADL's : Needs assistance/impaired Eating/Feeding: NPO Grooming: Minimal assistance,  Wash/dry face, Cueing for sequencing, Cueing for compensatory techniques, Sitting Grooming Details (indicate cue type and reason): EOB, Pt able to bring hand to face for tasks, attempted to put on lip balm with lid on. Could not problem solve despite multimodal max cues - required physical assist Upper Body Bathing: Moderate assistance, Sitting Lower Body Bathing: Moderate assistance, Sitting/lateral leans Upper Body Dressing : Moderate assistance, Sitting Upper Body Dressing Details (indicate cue type and reason): to don additional gown like robe Lower Body Dressing: Maximal assistance, Sitting/lateral leans Lower Body Dressing Details (indicate cue type and reason): Pt unable to  doff socks. When provided sock, Pt able to put on her R foot, but could not put it on L. Pt is flexible and able to perform figure 4 Toilet Transfer: Minimal assistance, Cueing for safety, Ambulation Toilet Transfer Details (indicate cue type and reason): 1 person HHA Toileting- Clothing Manipulation and Hygiene: Maximal assistance, Sit to/from stand Toileting - Clothing Manipulation Details (indicate cue type and reason): Pt wet and unaware Functional mobility during ADLs: Minimal assistance, Cueing for safety, Cueing for sequencing (1 person hand held assist)   Cognition: Cognition Orientation Level: Disoriented to time, Oriented to person, Oriented to place, Oriented to situation Cognition Arousal: Alert Behavior During Therapy: Flat affect   Blood pressure 102/72, pulse 67, temperature (!) 97.5 F (36.4 C), temperature source Oral, resp. rate 16, height 4\' 11"  (1.499 m), weight 49.6 kg, SpO2 93%. Physical Exam Gen: no distress, normal appearing HEENT: oral mucosa pink and moist, NCAT Cardio: Reg rate Chest: normal effort, normal rate of breathing Abd: soft, non-distended Ext: no edema Psych: pleasant, normal affect Skin: intact Neuro: Alert and oriented x3, mild left sided weakness with facial droop.  +left sided visual field cut, somnolent, sensation intact   Lab Results Last 24 Hours       Results for orders placed or performed during the hospital encounter of 10/08/23 (from the past 24 hours)  Glucose, capillary     Status: None    Collection Time: 10/10/23 11:41 AM  Result Value Ref Range    Glucose-Capillary 96 70 - 99 mg/dL  Glucose, capillary     Status: None    Collection Time: 10/10/23  4:20 PM  Result Value Ref Range    Glucose-Capillary 74 70 - 99 mg/dL    Comment 1 Notify RN      Comment 2 Document in Chart    Glucose, capillary     Status: Abnormal    Collection Time: 10/10/23  9:11 PM  Result Value Ref Range    Glucose-Capillary 136 (H) 70 - 99 mg/dL    Comment 1 Notify RN      Comment 2 Document in Chart    Glucose, capillary     Status: Abnormal    Collection Time: 10/11/23  6:31 AM  Result Value Ref Range    Glucose-Capillary 109 (H) 70 - 99 mg/dL    Comment 1 Notify RN      Comment 2 Document in Chart         Imaging Results (Last 48 hours)  MR BRAIN WO CONTRAST Result Date: 10/09/2023 CLINICAL DATA:  Stroke, follow up EXAM: MRI HEAD WITHOUT CONTRAST TECHNIQUE: Multiplanar, multiecho pulse sequences of the brain and surrounding structures were obtained without intravenous contrast. COMPARISON:  CT head October 08, 2023. MRI head November 26, 23. FINDINGS: Significantly motion limited study. Brain: Multiple punctate foci of restricted diffusion in bilateral frontal lobes and the left parietal lobe. No evidence of acute hemorrhage, mass lesion, midline shift or hydrocephalus. Cerebral atrophy. Vascular: Major arterial flow voids are maintained at the skull base. Skull and upper cervical spine: Normal marrow signal. Sinuses/Orbits: Mostly clear sinuses. IMPRESSION: 1. Findings compatible with multiple punctate acute infarcts in bilateral frontal and left parietal lobes. Given involvement of multiple vascular territories, consider an embolic etiology. 2. Significantly  motion limited study. Electronically Signed   By: Feliberto Harts M.D.   On: 10/09/2023 22:53    EEG adult Result Date: 10/09/2023 Charlsie Quest, MD     10/09/2023  4:41 PM Patient Name:  Gabrielle Vargas MRN: [id number] Epilepsy Attending: Charlsie Quest Referring Physician/Provider: Mathews Argyle, NP Date: 10/09/2023 Duration: 22.07 mins Patient history: 73 year old lady presented with sudden onset of speech difficulties and left hemiparesis. EEG to evaluate for seizure Level of alertness: comatose/ lethargic AEDs during EEG study: None Technical aspects: This EEG study was done with scalp electrodes positioned according to the 10-20 International system of electrode placement. Electrical activity was reviewed with band pass filter of 1-70Hz , sensitivity of 7 uV/mm, display speed of 64mm/sec with a 60Hz  notched filter applied as appropriate. EEG data were recorded continuously and digitally stored.  Video monitoring was available and reviewed as appropriate. Description: EEG showed continuous generalized polymorphic 3 to 6 Hz theta-delta slowing. Hyperventilation and photic stimulation were not performed.   ABNORMALITY - Continuous slow, generalized IMPRESSION: This study is suggestive of moderate diffuse encephalopathy. No seizures or epileptiform discharges were seen throughout the recording. Priyanka Annabelle Harman       Assessment/Plan: Diagnosis: Bilateral frontal and left parietal embolic infarcts Does the need for close, 24 hr/day medical supervision in concert with the patient's rehab needs make it unreasonable for this patient to be served in a less intensive setting? Yes Co-Morbidities requiring supervision/potential complications:  1) HTN, HLD, prediabetes, hypothyroidism, ADHD, B12 deficiency, mild cognitive impairment, dysphagia Due to bladder management, bowel management, safety, skin/wound care, disease management, medication administration, pain management, and patient education, does  the patient require 24 hr/day rehab nursing? Yes Does the patient require coordinated care of a physician, rehab nurse, therapy disciplines of PT, OT, SLP to address physical and functional deficits in the context of the above medical diagnosis(es)? Yes Addressing deficits in the following areas: balance, endurance, locomotion, strength, transferring, bowel/bladder control, bathing, dressing, feeding, grooming, toileting, and cognition Can the patient actively participate in an intensive therapy program of at least 3 hrs of therapy per day at least 5 days per week? Yes The potential for patient to make measurable gains while on inpatient rehab is excellent Anticipated functional outcomes upon discharge from inpatient rehab are supervision  with PT, supervision with OT, supervision with SLP. Estimated rehab length of stay to reach the above functional goals is: 10-14 days Anticipated discharge destination: Home Overall Rehab/Functional Prognosis: excellent   POST ACUTE RECOMMENDATIONS: This patient's condition is appropriate for continued rehabilitative care in the following setting: CIR Patient has agreed to participate in recommended program. Yes Note that insurance prior authorization may be required for reimbursement for recommended care.       I have personally performed a face to face diagnostic evaluation of this patient. Additionally, I have examined the patient's medical record including any pertinent labs and radiographic images.     Thanks,   Horton Chin, MD 10/11/2023          Routing History

## 2023-10-12 NOTE — Telephone Encounter (Signed)
 Spoke to pt husband and he stated that they were in the (ICU stroke unit). Pt is having PT while there but stated that left hand and left leg function has decreased. Pt husband is worried about pt fluctuating glucose.

## 2023-10-12 NOTE — Consult Note (Signed)
 ELECTROPHYSIOLOGY CONSULT NOTE   Patient ID: Gabrielle Vargas MRN: [id number], DOB/AGE: October 22, 1950   Admit date: 10/08/2023 Date of Consult: 10/12/2023  Primary Physician: Shirline Frees, NP Primary Cardiologist: none Reason for Consultation: Cryptogenic stroke - recommendations regarding Implantable Loop Recorder, requested by   History of Present Illness Gabrielle Vargas was admitted on 10/08/2023 with L sided weakness, found with stroke.    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  she has undergone workup for stroke including echocardiogram and carotid dopplers.  The patient has been monitored on telemetry which has demonstrated sinus rhythm with no arrhythmias.  neurology has deferred TEE  Cardiology service was brought on board when her echo came back with reduced LVEF, GDMT has been initiated with plans for out patient follow up "LVEF 30 to 35% with regional wall motion abnormalities. Could represent CAD versus Takotsubo's variant as presented with acute CVA" Recommended repeat echocardiogram as outpatient in next 3 months.  If EF remains reduced, would plan ischemic evaluation.  Not felt to be volume OL With no clear or noted thrombus LV did not think she had indication for anticoagulation and DAPT continued  Echocardiogram this admission demonstrated   1. Left ventricular ejection fraction, by estimation, is 30 to 35%. The  left ventricle has moderately decreased function. The left ventricle  demonstrates regional wall motion abnormalities (see scoring  diagram/findings for description). The left  ventricular internal cavity size was mildly dilated. Left ventricular  diastolic parameters are consistent with Grade I diastolic dysfunction  (impaired relaxation).   2. Right ventricular  systolic function is normal. The right ventricular  size is normal. There is normal pulmonary artery systolic pressure.   3. The mitral valve is normal in structure. Mild mitral valve  regurgitation. No evidence of mitral stenosis.   4. The aortic valve is tricuspid. There is mild calcification of the  aortic valve. Aortic valve regurgitation is mild. No aortic stenosis is  present.   5. The inferior vena cava is normal in size with greater than 50%  respiratory variability, suggesting right atrial pressure of 3 mmHg.   Conclusion(s)/Recommendation(s): No obvious LV thrombus with Definity  contrast however given apical and mid to distal septal WMAs patient seems  to be at high risk for cardio-embolic events.    Lab work is reviewed.  Prior to admission, the patient denies chest pain, shortness of breath, dizziness, palpitations, or syncope.  They are recovering from their stroke with plans to CIR at discharge.   Past Medical History:  Diagnosis Date   ADHD (attention deficit hyperactivity disorder) 05/09/2007   Diverticulitis of colon (without mention of hemorrhage) 09/17/2013   Essential hypertension 10/09/2017   History of COVID-19 2021   Hyperlipidemia    Hypothyroidism    Low back pain radiating to right leg 10/09/2017   Mild cognitive impairment of uncertain or unknown etiology 08/04/2022   Mild intermittent asthma with acute exacerbation 05/30/2017   Osteoarthritis    Postmenopausal atrophic vaginitis, severe 08/18/2008   Right knee pain 03/13/2014   S/P arthroscopy of right knee 06/03/2014   Vitamin B12 deficiency      Surgical History:  Past Surgical History:  Procedure Laterality Date   ABDOMINAL HYSTERECTOMY     BREAST SURGERY     CARPAL TUNNEL RELEASE Bilateral    CERVICAL DISC ARTHROPLASTY  CESAREAN SECTION     x2   left shoulder surgery     MENISCUS REPAIR Right 2017   REDUCTION MAMMAPLASTY Bilateral    right shoulder     TONSILLECTOMY        Medications Prior to Admission  Medication Sig Dispense Refill Last Dose/Taking   Acetylcysteine (NAC) 500 MG CAPS Take 500 mg by mouth daily.   Past Week   amphetamine-dextroamphetamine (ADDERALL) 20 MG tablet Take 1 tablet (20 mg total) by mouth 2 (two) times daily. (Patient taking differently: Take 20 mg by mouth daily.) 60 tablet 0 10/08/2023   Ascorbic Acid (VITAMIN C) 1000 MG tablet Take 1,000 mg by mouth daily.   Past Week   benzonatate (TESSALON) 200 MG capsule Take 1 capsule (200 mg total) by mouth 3 (three) times daily as needed. 30 capsule 1 10/08/2023   Bioflavonoid Products (QUERCETIN COMPLEX IMMUNE PO) Take 500 mg by mouth daily.   Past Week   Cholecalciferol (D3-50 PO) Take 1 capsule by mouth daily.   Past Week   Evening Primrose Oil 1000 MG CAPS Take 1,000 mg by mouth in the morning and at bedtime.   Past Week   ezetimibe (ZETIA) 10 MG tablet TAKE 1 TABLET BY MOUTH DAILY (Patient taking differently: Take 10 mg by mouth at bedtime.) 90 tablet 3 10/07/2023   hydrochlorothiazide (HYDRODIURIL) 25 MG tablet TAKE 1 TABLET BY MOUTH DAILY 90 tablet 1 10/08/2023   metFORMIN (GLUCOPHAGE) 500 MG tablet TAKE 2 TABLETS(1000 MG) BY MOUTH TWICE DAILY WITH A MEAL 360 tablet 1 10/08/2023   simvastatin (ZOCOR) 20 MG tablet TAKE 1 TABLET(20 MG) BY MOUTH EVERY OTHER DAY 45 tablet 3 Past Week   SYNTHROID 75 MCG tablet TAKE 1 TABLET BY MOUTH DAILY 90 tablet 3 10/08/2023   vitamin B-12 (CYANOCOBALAMIN) 1000 MCG tablet Take 1,000 mcg by mouth 2 (two) times a week.   10/05/2023   Zinc 20 MG CAPS Take 20 mg by mouth daily.   Past Week    Inpatient Medications:   aspirin  81 mg Oral Daily   Or   aspirin  300 mg Rectal Daily   atorvastatin  40 mg Oral Daily   Chlorhexidine Gluconate Cloth  6 each Topical Daily   clopidogrel  75 mg Oral Daily   cyanocobalamin  1,000 mcg Oral Once per day on Monday Thursday   enoxaparin (LOVENOX) injection  40 mg Subcutaneous Q24H   insulin aspart  0-15 Units Subcutaneous  TID WC   levothyroxine  75 mcg Oral Daily   losartan  12.5 mg Oral Daily   metoprolol succinate  25 mg Oral Daily    Allergies:  Allergies  Allergen Reactions   Oxycodone-Acetaminophen Nausea Only   Latex Hives and Rash    Social History   Socioeconomic History   Marital status: Married    Spouse name: Not on file   Number of children: 2   Years of education: 13   Highest education level: Some college, no degree  Occupational History   Occupation: Retired  Tobacco Use   Smoking status: Never   Smokeless tobacco: Never  Vaping Use   Vaping status: Never Used  Substance and Sexual Activity   Alcohol use: No   Drug use: No   Sexual activity: Not on file  Other Topics Concern   Not on file  Social History Narrative   Retired   Married   2 children   Right handed   Live with husband   Social  Drivers of Health   Financial Resource Strain: Low Risk  (12/23/2022)   Overall Financial Resource Strain (CARDIA)    Difficulty of Paying Living Expenses: Not hard at all  Food Insecurity: Patient Unable To Answer (10/09/2023)   Hunger Vital Sign    Worried About Running Out of Food in the Last Year: Patient unable to answer    Ran Out of Food in the Last Year: Patient unable to answer  Transportation Needs: Patient Unable To Answer (10/09/2023)   PRAPARE - Transportation    Lack of Transportation (Medical): Patient unable to answer    Lack of Transportation (Non-Medical): Patient unable to answer  Physical Activity: Sufficiently Active (12/23/2022)   Exercise Vital Sign    Days of Exercise per Week: 7 days    Minutes of Exercise per Session: 60 min  Stress: No Stress Concern Present (12/23/2022)   [employer] of Occupational Health - Occupational Stress Questionnaire    Feeling of Stress : Not at all  Social Connections: Patient Unable To Answer (10/09/2023)   Social Connection and Isolation Panel [NHANES]    Frequency of Communication with Friends and Family: Patient  unable to answer    Frequency of Social Gatherings with Friends and Family: Patient unable to answer    Attends Religious Services: Patient unable to answer    Active Member of Clubs or Organizations: Patient unable to answer    Attends Banker Meetings: Patient unable to answer    Marital Status: Patient unable to answer  Intimate Partner Violence: Patient Unable To Answer (10/09/2023)   Humiliation, Afraid, Rape, and Kick questionnaire    Fear of Current or Ex-Partner: Patient unable to answer    Emotionally Abused: Patient unable to answer    Physically Abused: Patient unable to answer    Sexually Abused: Patient unable to answer     Family History  Problem Relation Age of Onset   Melanoma Mother    Kidney disease Father    Diabetes Father    Heart disease Father    Breast cancer Paternal Grandmother    ADD / ADHD Other        multiple family members   Colon cancer Neg Hx    Esophageal cancer Neg Hx    Rectal cancer Neg Hx    Stomach cancer Neg Hx       Review of Systems: All other systems reviewed and are otherwise negative except as noted above.  Physical Exam: Vitals:   10/11/23 2316 10/12/23 0407 10/12/23 0757 10/12/23 1114  BP: 111/70 111/75 102/69 116/76  Pulse: (!) 59 (!) 59 (!) 58 (!) 58  Resp: 18 16 16    Temp: 98.3 F (36.8 C) 98.2 F (36.8 C) 98 F (36.7 C) (!) 97.3 F (36.3 C)  TempSrc: Axillary Oral Oral Oral  SpO2: 94% 94% 94% 94%  Weight:      Height:        GEN- The patient is well appearing, alert and oriented x 3 today.   Head- normocephalic, atraumatic Eyes-  Sclera clear, conjunctiva pink Ears- hearing intact Oropharynx- clear Neck- supple Lungs- CTA b/l, normal work of breathing Heart- RRR, no murmurs, rubs or gallops  GI- soft, NT, ND Extremities- no clubbing, cyanosis, or edema MS- no significant deformity or atrophy Skin- no rash or lesion Psych- euthymic mood, full affect   Labs:   Lab Results  Component Value  Date   WBC 5.6 10/10/2023   HGB 14.4 10/10/2023   HCT 42.6  10/10/2023   MCV 83.2 10/10/2023   PLT 147 (L) 10/10/2023    Recent Labs  Lab 10/08/23 1710 10/08/23 1717 10/12/23 0628  NA 135   < > 135  K 2.9*   < > 3.4*  CL 93*   < > 101  CO2 30   < > 23  BUN 12   < > 13  CREATININE 0.70   < > 0.68  CALCIUM 9.2   < > 9.2  PROT 6.4*  --   --   BILITOT 0.5  --   --   ALKPHOS 73  --   --   ALT 17  --   --   AST 24  --   --   GLUCOSE 117*   < > 103*   < > = values in this interval not displayed.   No results found for: "CKTOTAL", "CKMB", "CKMBINDEX", "TROPONINI" Lab Results  Component Value Date   CHOL 102 10/09/2023   CHOL 156 09/05/2023   CHOL 152 05/31/2022   Lab Results  Component Value Date   HDL 52 10/09/2023   HDL 58.50 09/05/2023   HDL 65.70 05/31/2022   Lab Results  Component Value Date   LDLCALC 33 10/09/2023   LDLCALC 80 09/05/2023   LDLCALC 64 05/31/2022   Lab Results  Component Value Date   TRIG 87 10/09/2023   TRIG 86.0 09/05/2023   TRIG 113.0 05/31/2022   Lab Results  Component Value Date   CHOLHDL 2.0 10/09/2023   CHOLHDL 3 09/05/2023   CHOLHDL 2 05/31/2022   Lab Results  Component Value Date   LDLDIRECT 158.7 06/19/2014   LDLDIRECT 184.4 06/18/2013   LDLDIRECT 169.0 02/06/2012    No results found for: "DDIMER"   Radiology/Studies:   MR BRAIN WO CONTRAST Result Date: 10/09/2023 CLINICAL DATA:  Stroke, follow up EXAM: MRI HEAD WITHOUT CONTRAST TECHNIQUE: Multiplanar, multiecho pulse sequences of the brain and surrounding structures were obtained without intravenous contrast. COMPARISON:  CT head October 08, 2023. MRI head November 26, 23. FINDINGS: Significantly motion limited study. Brain: Multiple punctate foci of restricted diffusion in bilateral frontal lobes and the left parietal lobe. No evidence of acute hemorrhage, mass lesion, midline shift or hydrocephalus. Cerebral atrophy. Vascular: Major arterial flow voids are maintained at the  skull base. Skull and upper cervical spine: Normal marrow signal. Sinuses/Orbits: Mostly clear sinuses. IMPRESSION: 1. Findings compatible with multiple punctate acute infarcts in bilateral frontal and left parietal lobes. Given involvement of multiple vascular territories, consider an embolic etiology. 2. Significantly motion limited study. Electronically Signed   By: Feliberto Harts M.D.   On: 10/09/2023 22:53   EEG adult Result Date: 10/09/2023 Charlsie Quest, MD     10/09/2023  4:41 PM Patient Name: ADRIJANA HAROS MRN: [id number] Epilepsy Attending: Charlsie Quest Referring Physician/Provider: Mathews Argyle, NP Date: 10/09/2023 Duration: 22.07 mins Patient history: 73 year old lady presented with sudden onset of speech difficulties and left hemiparesis. EEG to evaluate for seizure Level of alertness: comatose/ lethargic AEDs during EEG study: None Technical aspects: This EEG study was done with scalp electrodes positioned according to the 10-20 International system of electrode placement. Electrical activity was reviewed with band pass filter of 1-70Hz , sensitivity of 7 uV/mm, display speed of 69mm/sec with a 60Hz  notched filter applied as appropriate. EEG data were recorded continuously and digitally stored.  Video monitoring was available and reviewed as appropriate. Description: EEG showed continuous generalized polymorphic 3 to 6 Hz theta-delta slowing. Hyperventilation  and photic stimulation were not performed.   ABNORMALITY - Continuous slow, generalized IMPRESSION: This study is suggestive of moderate diffuse encephalopathy. No seizures or epileptiform discharges were seen throughout the recording. Charlsie Quest    CT Head Wo Contrast Result Date: 10/08/2023 CLINICAL DATA:  Stroke-like symptoms EXAM: CT HEAD WITHOUT CONTRAST TECHNIQUE: Contiguous axial images were obtained from the base of the skull through the vertex without intravenous contrast. RADIATION DOSE REDUCTION: This exam  was performed according to the departmental dose-optimization program which includes automated exposure control, adjustment of the mA and/or kV according to patient size and/or use of iterative reconstruction technique. COMPARISON:  09/10/2023 FINDINGS: Brain: No evidence of acute infarction, hemorrhage, hydrocephalus, extra-axial collection or mass lesion/mass effect. Mild atrophic changes are noted commensurate with the patient's given age. Vascular: No hyperdense vessel or unexpected calcification. Skull: Normal. Negative for fracture or focal lesion. Sinuses/Orbits: No acute finding. Other: None. IMPRESSION: Chronic atrophic changes without acute abnormality. Electronically Signed   By: Alcide Clever M.D.   On: 10/08/2023 19:18   CT ANGIO HEAD NECK W WO CM (CODE STROKE) Result Date: 10/08/2023 CLINICAL DATA:  Code stroke EXAM: CT ANGIOGRAPHY HEAD AND NECK WITH AND WITHOUT CONTRAST TECHNIQUE: Multidetector CT imaging of the head and neck was performed using the standard protocol during bolus administration of intravenous contrast. Multiplanar CT image reconstructions and MIPs were obtained to evaluate the vascular anatomy. Carotid stenosis measurements (when applicable) are obtained utilizing NASCET criteria, using the distal internal carotid diameter as the denominator. RADIATION DOSE REDUCTION: This exam was performed according to the departmental dose-optimization program which includes automated exposure control, adjustment of the mA and/or kV according to patient size and/or use of iterative reconstruction technique. CONTRAST:  75mL OMNIPAQUE IOHEXOL 350 MG/ML SOLN COMPARISON:  MR angio head 06/12/2022. FINDINGS: CTA NECK FINDINGS Aortic arch: A 3 vessel arch configuration is present. Minimal calcifications are present at the distal arch. No significant scratched at the great vessel origins are widely patent. No aneurysm or dissection is present. Right carotid system: The right common carotid artery is  within normal limits. The bifurcation is unremarkable. Mild tortuosity is present in the distal cervical right ICA without focal stenosis. Left carotid system: Left common carotid artery is within normal limits. Minimal atherosclerotic changes present at the proximal left ICA without significant stenosis. Cervical left ICA is otherwise normal. Vertebral arteries: The right vertebral artery is dominant. Both vertebral arteries originate from the subclavian arteries without significant stenosis. No significant stenosis is present in either vertebral artery in the neck. Skeleton: Cervical fusion is noted at C4-5 and C5-6. Slight degenerative anterolisthesis is present at C2-3 and C3-4. Other neck: The soft tissues of the neck are otherwise unremarkable. Salivary glands are within normal limits. Thyroid is normal. No significant adenopathy is present. No focal mucosal or submucosal lesions are present. Upper chest: The lung apices are clear. The thoracic inlet is within normal limits. 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 The internal carotid arteries are otherwise within normal limits from the skull base to the ICA termini. The A1 and M1 segments are normal. The anterior communicating artery is patent. The MCA bifurcations are normal bilaterally. There is some attenuation of distal MCA branch vessels bilaterally without a significant proximal stenosis or occlusion. No other aneurysm is present. Posterior circulation: The right vertebral artery is dominant. The PICA origins are  visualized and normal. The left vertebral artery is centrally terminates at the PICA. The basilar artery is within normal limits. The superior cerebellar arteries are patent bilaterally. The right posterior cerebral artery Jeanette some basilar tip. Left posterior cerebral artery is of fetal type. The PCA  branch vessels are normal bilaterally. Venous sinuses: The dural sinuses are patent. The straight sinus and deep cerebral veins are intact. Cortical veins are within normal limits. No significant vascular malformation is evident. Anatomic variants: Fetal type left posterior cerebral artery. Review of the MIP images confirms the above findings IMPRESSION: 1. 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 2. No significant proximal stenosis, aneurysm, or branch vessel occlusion within the Circle of Willis. 3. Minimal atherosclerotic change at the proximal left ICA without significant stenosis. 4. Cervical fusion at C4-5 and C5-6. 5. Slight degenerative anterolisthesis at C2-3 and C3-4. 6.  Aortic Atherosclerosis (ICD10-I70.0). These results were called by telephone at the time of interpretation on 10/08/2023 at 5:33 pm to provider ER[facility] , who verbally acknowledged these results. Electronically Signed   By: Marin Roberts M.D.   On: 10/08/2023 17:46   CT HEAD CODE STROKE WO CONTRAST Result Date: 10/08/2023 CLINICAL DATA:  Code stroke. Neuro deficit, acute, stroke suspected. EXAM: CT HEAD WITHOUT CONTRAST TECHNIQUE: Contiguous axial images were obtained from the base of the skull through the vertex without intravenous contrast. RADIATION DOSE REDUCTION: This exam was performed according to the departmental dose-optimization program which includes automated exposure control, adjustment of the mA and/or kV according to patient size and/or use of iterative reconstruction technique. COMPARISON:  MR head without contrast 06/12/2022. FINDINGS: Brain: No acute infarct, hemorrhage, or mass lesion is present. No significant white matter lesions are present. Deep brain nuclei are within normal limits. The ventricles are of normal size. No significant extraaxial fluid collection is present. The brainstem and cerebellum are within normal limits. Midline structures are within normal limits.  Vascular: Atherosclerotic calcifications are present within the cavernous internal carotid arteries bilaterally. No hyperdense vessel is present. Skull: Calvarium is intact. No focal lytic or blastic lesions are present. No significant extracranial soft tissue lesion is present. Sinuses/Orbits: The paranasal sinuses and mastoid air cells are clear. The globes and orbits are within normal limits. ASPECTS [facility] Stroke Program Early CT Score) - Ganglionic level infarction (caudate, lentiform nuclei, internal capsule, insula, M1-M3 cortex): 7/7 - Supraganglionic infarction (M4-M6 cortex): 3/3 Total score (0-10 with 10 being normal): 10/10 IMPRESSION: 1. Normal CT appearance of the brain. 2. Aspects is 10/10. These results were called by telephone at the time of interpretation on 10/08/2023 at 5:34 pm to provider Dr. Otelia Limes, who verbally acknowledged these results. Electronically Signed   By: Marin Roberts M.D.   On: 10/08/2023 17:35    12-lead ECG SR All prior EKG's in EPIC reviewed with no documented atrial fibrillation  Telemetry SR One PAT No AFib She has had intermittent flurries of frequent PCVs, and PMNSVTs >> these do seem to have simmered down in the last 24 hours  Assessment and Plan:  1. Cryptogenic stroke The patient presents with cryptogenic stroke.   I spoke at length with the patient as well as her husband, son, daughter all at bedside about monitoring for afib with either a 30 day event monitor or an implantable loop recorder.  Risks, benefits, and alteratives to implantable loop recorder were discussed with the patient today.     We also discussed her CM and possible need to discuss  different devices in the future should her heart muscle function not improve, and still utilizing loop monitor and rational for that  At this time, the patient her husband and family are all in agreement in their decision for her to proceed with implantable loop recorder.   Wound care was  reviewed with the patient (keep incision clean and dry for 3 days).  Wound check follow up will be scheduled for the patient.  Please call with questions.   Saragrace Selke Norberto Sorenson, PA-C 10/12/2023

## 2023-10-12 NOTE — Progress Notes (Signed)
 Inpatient Rehabilitation Admissions Coordinator   I await insurance approval for possible CIR admit. Noted need for LOOP prior to admit.  Ottie Glazier, RN, MSN Rehab Admissions Coordinator [phone] 10/12/2023 11:37 AM

## 2023-10-12 NOTE — Discharge Instructions (Signed)
 Care After Your Loop Recorder   Monitor your cardiac device site for redness, swelling, and drainage. Call the device clinic at [phone] if you experience these symptoms or fever/chills.  If you notice bleeding from your site, hold firm, but gently pressure with two fingers for 5 minutes. Dried blood on the steri-strips when removing the outer bandage is normal.   Keep the large square bandage on your site for 24 hours and then you may remove it yourself. Keep the steri-strips underneath in place.   You may shower after 72 hours / 3 days from your procedure with the steri-strips in place. They will usually fall off on their own, or may be removed after 10 days. Pat dry.   Avoid lotions, ointments, or perfumes over your incision until it is well-healed.  Please do not submerge in water until your site is completely healed.   Your device is MRI compatible.   Remote monitoring is used to monitor your cardiac device from home. This monitoring is scheduled every month by our office. It allows [place] to keep an eye on the function of your device to ensure it is working properly.

## 2023-10-12 NOTE — Progress Notes (Signed)
 Relative noticed patient had stuttered speech as compared to her speech today, assessed the pt with NIH score of 4. Informed MD Derry Lory, advised to close monitor the pt.

## 2023-10-12 NOTE — Telephone Encounter (Signed)
 Copied from CRM [id number]. Topic: General - Other >> Oct 12, 2023 11:33 AM Pascal Lux wrote: Reason for CRM: Patient spouse called and stated that his wife is at [facility] because she recently had a stroke and he wanted to let her provider know and also speak with him or his nurse. Call back: [phone]

## 2023-10-12 NOTE — Progress Notes (Signed)
 Rounding Note    Patient Name: MYSTIC LABO Date of Encounter: 10/12/2023  [facility] Cardiologist: None   Subjective   BP 102/69, pulse 58.  Creatinine 0.68  Inpatient Medications    Scheduled Meds:  aspirin  81 mg Oral Daily   Or   aspirin  300 mg Rectal Daily   atorvastatin  40 mg Oral Daily   Chlorhexidine Gluconate Cloth  6 each Topical Daily   clopidogrel  75 mg Oral Daily   cyanocobalamin  1,000 mcg Oral Once per day on Monday Thursday   enoxaparin (LOVENOX) injection  40 mg Subcutaneous Q24H   insulin aspart  0-15 Units Subcutaneous TID WC   levothyroxine  75 mcg Oral Daily   metoprolol succinate  25 mg Oral Daily   Continuous Infusions:  PRN Meds: acetaminophen **OR** acetaminophen (TYLENOL) oral liquid 160 mg/5 mL **OR** acetaminophen, mouth rinse, senna-docusate   Vital Signs    Vitals:   10/11/23 2014 10/11/23 2316 10/12/23 0407 10/12/23 0757  BP: 111/63 111/70 111/75 102/69  Pulse: (!) 59 (!) 59 (!) 59 (!) 58  Resp: 17 18 16 16   Temp: 98.6 F (37 C) 98.3 F (36.8 C) 98.2 F (36.8 C) 98 F (36.7 C)  TempSrc: Oral Axillary Oral Oral  SpO2: 95% 94% 94% 94%  Weight:      Height:       No intake or output data in the 24 hours ending 10/12/23 0947    10/08/2023    5:00 PM 10/04/2023    2:07 PM 09/05/2023    8:30 AM  Last 3 Weights  Weight (lbs) 109 lb 5.6 oz 110 lb 111 lb  Weight (kg) 49.6 kg 49.896 kg 50.349 kg      Telemetry    NSR - Personally Reviewed  ECG    No new ECG - Personally Reviewed  Physical Exam   GEN: No acute distress.   Neck: No JVD Cardiac: RRR, no murmurs, rubs, or gallops.  Respiratory: Clear to auscultation bilaterally. GI: Soft, nontender, non-distended  MS: No edema; No deformity. Neuro:  Nonfocal  Psych: Normal affect   Labs    High Sensitivity Troponin:  No results for input(s): "TROPONINIHS" in the last 720 hours.   Chemistry Recent Labs  Lab 10/08/23 1710 10/08/23 1717  10/09/23 4132 10/10/23 0911 10/11/23 0911 10/12/23 0628  NA 135   < > 129* 134*  --  135  K 2.9*   < > 3.5 4.0  --  3.4*  CL 93*   < > 93* 100  --  101  CO2 30  --  22 20*  --  23  GLUCOSE 117*   < > 98 97  --  103*  BUN 12   < > 9 13  --  13  CREATININE 0.70   < > 0.56 0.63  --  0.68  CALCIUM 9.2  --  8.7* 8.9  --  9.2  MG  --   --  1.7  --  2.1  --   PROT 6.4*  --   --   --   --   --   ALBUMIN 3.6  --   --   --   --   --   AST 24  --   --   --   --   --   ALT 17  --   --   --   --   --   ALKPHOS 73  --   --   --   --   --  BILITOT 0.5  --   --   --   --   --   GFRNONAA >60  --  >60 >60  --  >60  ANIONGAP 12  --  14 14  --  11   < > = values in this interval not displayed.    Lipids  Recent Labs  Lab 10/09/23 0712  CHOL 102  TRIG 87  HDL 52  LDLCALC 33  CHOLHDL 2.0    Hematology Recent Labs  Lab 10/08/23 1710 10/08/23 1717 10/09/23 1149 10/10/23 0911  WBC 5.8  --  5.2 5.6  RBC 4.67  --  4.89 5.12*  HGB 13.4 12.9 14.0 14.4  HCT 39.6 38.0 39.7 42.6  MCV 84.8  --  81.2 83.2  MCH 28.7  --  28.6 28.1  MCHC 33.8  --  35.3 33.8  RDW 12.4  --  12.1 12.5  PLT 177  --  167 147*   Thyroid No results for input(s): "TSH", "FREET4" in the last 168 hours.  BNPNo results for input(s): "BNP", "PROBNP" in the last 168 hours.  DDimer No results for input(s): "DDIMER" in the last 168 hours.   Radiology    No results found.   Cardiac Studies     Patient Profile     73 y.o. female  73 y.o. female with a hx of hypertension, hyperlipidemia, prediabetes, hypothyroidism, ADHD who is being seen 10/10/2023 for the evaluation of new HFrEF at the request of neurology.  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  MRI showed multiple acute infarcts in bilateral frontal/left parietal lobes.  Echocardiogram shows EF 30 to 35%.  Assessment & Plan    Acute combined heart failure: Echocardiogram shows LVEF 30 to 35% with regional wall motion abnormalities.   Could represent CAD versus Takotsubo's variant as presented with acute CVA -Discussed with Dr. Pearlean Brownie, she is outside the window for permissive hypertension and okay to start GDMT.  Started Toprol-XL 25 mg daily.  BP soft, no room to add Entresto.  Will add low-dose losartan 12.5 mg daily.  Can add SGLT2 and spironolactone as outpatient -Recommend repeat echocardiogram as outpatient in next 3 months.  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  MRI showed multiple acute infarcts in bilateral frontal/left parietal lobes. -Loop recorder planned with EP -Continue aspirin plus Plavix per neurology.  Did discuss anticoagulation empirically with neurology given low EF on echo but in absence of LV thrombus seen, recommendation is antiplatelets at this time  Hyperlipidemia: Continue atorvastatin 40 mg daily, Zetia 10 mg daily.  LDL 33  NSVT: 15 beats noted on telemetry.  Low K+, would maintain K>4, mag>2   For questions or updates, please contact [facility] Please consult [email or web address] for contact info under        Signed, Little Ishikawa, MD  10/12/2023, 9:47 AM

## 2023-10-12 NOTE — Progress Notes (Signed)
 Gabrielle Sheriff, DO  Physician Physical Medicine and Rehabilitation   PMR Pre-admission    Signed   Date of Service: 10/11/2023  6:22 PM  Related encounter: ED to Hosp-Admission (Current) from 10/08/2023 in [place] [facility]   Signed     Expand All Collapse All  Show:Clear all [x] Written[x] Templated[x] Copied  Added by: [x] Standley Brooking, RN  [] Hover for details PMR Admission Coordinator Pre-Admission Assessment   Patient: Gabrielle Vargas is an 73 y.o., female MRN: [id number] DOB: [birth year] Height: 4\' 11"  (149.9 cm) Weight: 49.6 kg                                                                                                                       Insurance Information HMO:     PPO: yes     PCP:      IPA:      80/20:      OTHER:  PRIMARY: [employer] PPO      Policy#: [id number]; Medicare [id number]      Subscriber: pt CM Name: Tammy      Phone#: [phone]     Fax#: [phone] and EMR access Pre-Cert#: [id number] approved for 7 days 3/27 until 4/3      Employer:  Benefits:  Phone #: [phone]     Name: 3/26 Eff. Date: 07/19/23     Deduct: none      Out of Pocket Max: $3400      Life Max: none  CIR: $325 co pay per day days 1 until 6      SNF: no co pay per day days 1 until 20; $214 co pay per day days 21 until 100 Outpatient: $15 per visit     Co-Pay:  Home Health: 100%      Co-Pay:  DME: 75%     Co-Pay: 25% Providers: in network  SECONDARY: none      Policy#:       Phone#:    Artist:       Phone#:    The Data processing manager" for patients in Inpatient Rehabilitation Facilities with attached "Privacy Act Statement-Health Care Records" was provided and verbally reviewed with: Patient and Family   Emergency Contact Information Contact Information       Name Relation Home Work Mobile    Seckinger,George Spouse [phone]   [phone]         Other Contacts   None on File      Current Medical  History  Patient Admitting Diagnosis: CVA   History of Present Illness: Gabrielle Vargas is a 73 year old female who presented to [facility] ED on 10/08/2023 with sudden onset of left facial droop and left-sided weakness.    Code stroke activated. 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 No significant proximal stenosis, aneurysm, or branch vessel occlusion within the Circle of Willis. Minimal atherosclerotic change at the  proximal left ICA without significant stenosis. Aortic atherosclerosis. Incidentally imaged spine findings on CTA: Cervical fusion at C4-5 and C5-6. Slight degenerative anterolisthesis at C2-3 and C3-4. EKG: Sinus rhythm; Atrial premature complex; Nonspecific T abnormalities, lateral leads. Labs: Na 133, K 2.8, glucose 110, ionized calcium 1.08, LFTs normal, BUN and Cr normal, CBC normal, U/A normal, UDS positive for amphetamine (on Adderall at home).   Received TNK at ~1800 hours. Neurology consulted and started on aspirin and Plavix. MRI: Multiple punctate acute infarcts bilateral frontal and left parietal lobes.   2D Echo EF 30 to 35%.  LV with grade 1 diastolic dysfunction. LDL 80. Hgb A1c 6.2%. Cardiology consulted. Per cardiology assessment: Acute combined heart failure: Echocardiogram shows LVEF 30 to 35% with regional wall motion abnormalities.  Could represent CAD versus Takotsubo's variant as presented with acute CVA. Discussed with Dr. Pearlean Brownie, she is outside the window for permissive hypertension and okay to start GDMT.  Started Toprol-XL 25 mg daily.  BP soft, no room to add Entresto.  Will add low-dose losartan 12.5 mg daily.  Can add SGLT2 and spironolactone as outpatient. Recommend repeat echocardiogram as outpatient in next 3 months.  If EF remains reduced, would plan ischemic evaluation. Currently euvolemic, no diuresis recommended. Now on aspirin 81 mg and Plavix 75 mg for 3 weeks, then aspirin alone.   Discussed with cardiology no need for anticoagulation at this time. Loop recorder to be placed today, 3/27   EEG suggestive of moderate diffuse encephalopathy. No seizures or epileptiform discharges were seen throughout the recording. Tolerating regular diet with thin liquids. Overall she requires mod A for UB and LB ADL, min A for transfers and +2 safety for mobility with HHA. 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    She is typically independent in ADL and mobility. Drives, retired Armed forces operational officer. Enjoys walking 4-6 miles daily with husband.    Complete NIHSS TOTAL: 4 Glasgow Coma Scale Score: 14   Patient's medical record from [facility] has been reviewed by the rehabilitation admission coordinator and physician.   Past Medical History      Past Medical History:  Diagnosis Date   ADHD (attention deficit hyperactivity disorder) 05/09/2007   Diverticulitis of colon (without mention of hemorrhage) 09/17/2013   Essential hypertension 10/09/2017   History of COVID-19 2021   Hyperlipidemia     Hypothyroidism     Low back pain radiating to right leg 10/09/2017   Mild cognitive impairment of uncertain or unknown etiology 08/04/2022   Mild intermittent asthma with acute exacerbation 05/30/2017   Osteoarthritis     Postmenopausal atrophic vaginitis, severe 08/18/2008   Right knee pain 03/13/2014   S/P arthroscopy of right knee 06/03/2014   Vitamin B12 deficiency          Has the patient had major surgery during 100 days prior to admission? No   Family History  family history includes ADD / ADHD in an other family member; Breast cancer in her paternal grandmother; Diabetes in her father; Heart disease in her father; Kidney disease in her father; Melanoma in her mother.   Current Medications   Current Medications    Current  Facility-Administered Medications:    acetaminophen (TYLENOL) tablet 650 mg, 650 mg, Oral, Q4H PRN **OR** acetaminophen (TYLENOL) 160 MG/5ML solution 650 mg, 650 mg, Per Tube, Q4H PRN **OR** acetaminophen (TYLENOL) suppository 650 mg, 650 mg, Rectal, Q4H PRN, Lindzen,  Minerva Areola, MD   aspirin chewable tablet 81 mg, 81 mg, Oral, Daily, 81 mg at 10/12/23 0849 **OR** aspirin suppository 300 mg, 300 mg, Rectal, Daily, Richardo Priest, Erin C, NP   atorvastatin (LIPITOR) tablet 40 mg, 40 mg, Oral, Daily, Gevena Mart A, NP, 40 mg at 10/12/23 1610   Chlorhexidine Gluconate Cloth 2 % PADS 6 each, 6 each, Topical, Daily, Caryl Pina, MD, 6 each at 10/12/23 1258   clopidogrel (PLAVIX) tablet 75 mg, 75 mg, Oral, Daily, Richardo Priest, Erin C, NP, 75 mg at 10/12/23 9604   cyanocobalamin (VITAMIN B12) tablet 1,000 mcg, 1,000 mcg, Oral, Once per day on Monday Thursday, Otelia Limes, Minerva Areola, MD, 1,000 mcg at 10/12/23 0849   enoxaparin (LOVENOX) injection 40 mg, 40 mg, Subcutaneous, Q24H, Sethi, Pramod S, MD, 40 mg at 10/12/23 1241   insulin aspart (novoLOG) injection 0-15 Units, 0-15 Units, Subcutaneous, TID WC, Caryl Pina, MD   levothyroxine (SYNTHROID) tablet 75 mcg, 75 mcg, Oral, Daily, Caryl Pina, MD, 75 mcg at 10/12/23 0543   losartan (COZAAR) tablet 12.5 mg, 12.5 mg, Oral, Daily, Little Ishikawa, MD, 12.5 mg at 10/12/23 1258   metoprolol succinate (TOPROL-XL) 24 hr tablet 25 mg, 25 mg, Oral, Daily, Little Ishikawa, MD, 25 mg at 10/12/23 0848   Oral care mouth rinse, 15 mL, Mouth Rinse, PRN, Micki Riley, MD   senna-docusate (Senokot-S) tablet 1 tablet, 1 tablet, Oral, QHS PRN, Caryl Pina, MD     Patients Current Diet:  Diet Order                  Diet regular Room service appropriate? Yes with Assist; Fluid consistency: Thin  Diet effective now                       Precautions / Restrictions Precautions Precautions: Fall Restrictions Weight Bearing Restrictions Per Provider Order: No     Has the patient had 2 or more falls or a fall with injury in the past year?No   Prior Activity Level Community (5-7x/wk): independent, very active , walks 4 to 6 miles per day   Prior Functional Level Prior Function Prior Level of Function : Independent/Modified Independent, Driving Mobility Comments: no DME, walks 4-6 miles daily ADLs Comments: independent, does IADL   Self Care: Did the patient need help bathing, dressing, using the toilet or eating?  Independent   Indoor Mobility: Did the patient need assistance with walking from room to room (with or without device)? Independent   Stairs: Did the patient need assistance with internal or external stairs (with or without device)? Independent   Functional Cognition: Did the patient need help planning regular tasks such as shopping or remembering to take medications? Independent   Patient Information Are you of Hispanic, Latino/a,or Spanish origin?: A. No, not of Hispanic, Latino/a, or Spanish origin What is your race?: A. White Do you need or want an interpreter to communicate with a doctor or health care staff?: 0. No   Patient's Response To:  Health Literacy and Transportation Is the patient able to respond to health literacy and transportation needs?: Yes Health Literacy - How often do you need to have someone help you when you read instructions, pamphlets, or other written material from your doctor or pharmacy?: Never In the past 12 months, has lack of transportation kept you from medical appointments or from getting medications?: No In the past 12 months, has lack of transportation kept you from meetings, work, or from getting things  needed for daily living?: No   Home Assistive Devices / Equipment Home Equipment: None   Prior Device Use: Indicate devices/aids used by the patient prior to current illness, exacerbation or injury? None of the above   Current Functional Level Cognition   Arousal/Alertness:  Awake/alert Overall Cognitive Status: Impaired/Different from baseline Orientation Level: Oriented X4 Attention: Sustained Sustained Attention: Impaired Sustained Attention Impairment: Verbal basic, Functional basic Memory: Impaired Memory Impairment: Retrieval deficit, Decreased recall of new information, Decreased short term memory, Other (comment) Decreased Short Term Memory: Verbal basic, Functional basic Awareness: Appears intact Problem Solving: Appears intact Executive Function: Landscape architect: Impaired Organizing Impairment: Verbal basic, Functional basic Behaviors: Perseveration Safety/Judgment: Appears intact    Extremity Assessment (includes Sensation/Coordination)   Upper Extremity Assessment: Defer to OT evaluation LUE Deficits / Details: generally 4/5 strength, inconsistent awarness and use of LUE. MMT varied - limited by cognition LUE Sensation:  (need to assess) LUE Coordination: decreased gross motor  Lower Extremity Assessment: LLE deficits/detail LLE Deficits / Details: L-sided inattention/neglect with inconsistent command follows. Pt demonstrates some motor apraxia (i.e marches and LAQs)     ADLs   Overall ADL's : Needs assistance/impaired Eating/Feeding: NPO Grooming: Minimal assistance, Wash/dry face, Cueing for sequencing, Cueing for compensatory techniques, Sitting Grooming Details (indicate cue type and reason): EOB, Pt able to bring hand to face for tasks, attempted to put on lip balm with lid on. Could not problem solve despite multimodal max cues - required physical assist Upper Body Bathing: Moderate assistance, Sitting Lower Body Bathing: Moderate assistance, Sitting/lateral leans Upper Body Dressing : Moderate assistance, Sitting Upper Body Dressing Details (indicate cue type and reason): to don additional gown like robe Lower Body Dressing: Maximal assistance, Sitting/lateral leans Lower Body Dressing Details (indicate cue type and reason):  Pt unable to doff socks. When provided sock, Pt able to put on her R foot, but could not put it on L. Pt is flexible and able to perform figure 4 Toilet Transfer: Minimal assistance, Cueing for safety, Ambulation Toilet Transfer Details (indicate cue type and reason): 1 person HHA Toileting- Clothing Manipulation and Hygiene: Maximal assistance, Sit to/from stand Toileting - Clothing Manipulation Details (indicate cue type and reason): Pt wet and unaware Functional mobility during ADLs: Minimal assistance, Cueing for safety, Cueing for sequencing (1 person hand held assist)     Mobility   Overal bed mobility: Needs Assistance Bed Mobility: Supine to Sit Supine to sit: Mod assist, +2 for physical assistance General bed mobility comments: Up in chair eating lunch     Transfers   Overall transfer level: Needs assistance Equipment used: 2 person hand held assist Transfers: Sit to/from Stand Sit to Stand: Min assist Bed to/from chair/wheelchair/BSC transfer type:: Step pivot Step pivot transfers: Min assist, +2 safety/equipment General transfer comment: +2 Min A to ensure safety with sit > stand. Patient uses LE support to power up from a seated position.     Ambulation / Gait / Stairs / Wheelchair Mobility   Ambulation/Gait Ambulation/Gait assistance: Editor, commissioning (Feet): 200 Feet Assistive device: Rolling walker (2 wheels) Gait Pattern/deviations: Drifts right/left, Trunk flexed, Decreased stride length, Step-through pattern General Gait Details: Min A due to pt requiring constant cues for promixity and safety with RW. Pt still with L sided neglect and needed Min A to navigate obstacles in L side. Gait velocity: Decreased     Posture / Balance Dynamic Sitting Balance Sitting balance - Comments: Requires some cues to maintain upright sitting posture Balance Overall  balance assessment: Needs assistance Sitting-balance support: Feet supported Sitting balance-Leahy Scale:  Fair Sitting balance - Comments: Requires some cues to maintain upright sitting posture Postural control: Posterior lean, Left lateral lean Standing balance support: Bilateral upper extremity supported Standing balance-Leahy Scale: Poor Standing balance comment: RW     Special needs/care consideration LOOP to be placed prior to admit to CIR Hgb A1c 6.2 Prefers females for all dressing and bathing      Previous Home Environment  Living Arrangements: Spouse/significant other  Lives With: Spouse Available Help at Discharge: Available 24 hours/day Type of Home: House Home Layout: Multi-level, Able to live on main level with bedroom/bathroom Home Access: Stairs to enter Entrance Stairs-Rails: None Entrance Stairs-Number of Steps: 2 Bathroom Shower/Tub: Health visitor: Standard Bathroom Accessibility: Yes How Accessible: Accessible via walker Home Care Services: No   Discharge Living Setting Plans for Discharge Living Setting: Patient's home, Lives with (comment) (spouse) Type of Home at Discharge: House Discharge Home Layout: Multi-level, Able to live on main level with bedroom/bathroom Discharge Home Access: Stairs to enter Entrance Stairs-Rails: None Entrance Stairs-Number of Steps: 2 Discharge Bathroom Shower/Tub: Walk-in shower Discharge Bathroom Toilet: Standard Discharge Bathroom Accessibility: Yes How Accessible: Accessible via walker Does the patient have any problems obtaining your medications?: No   Social/Family/Support Systems Patient Roles: Spouse, Parent Contact Information: spouse Anticipated Caregiver: spouse and daughter Anticipated Caregiver's Contact Information: see contacts Ability/Limitations of Caregiver: no limitations; very involved Caregiver Availability: 24/7 Discharge Plan Discussed with Primary Caregiver: Yes Is Caregiver In Agreement with Plan?: Yes Does Caregiver/Family have Issues with Lodging/Transportation while Pt is in  Rehab?: No   Goals Patient/Family Goal for Rehab: supervision with PT, OT and SLP Expected length of stay: ELOS 10 to 12 days Pt/Family Agrees to Admission and willing to participate: Yes Program Orientation Provided & Reviewed with Pt/Caregiver Including Roles  & Responsibilities: Yes   Decrease burden of Care through IP rehab admission: n/a   Possible need for SNF placement upon discharge:not anticipated   Patient Condition: This patient's condition remains as documented in the consult dated 10/11/23, in which the Rehabilitation Physician determined and documented that the patient's condition is appropriate for intensive rehabilitative care in an inpatient rehabilitation facility. Will admit to inpatient rehab today.   Preadmission Screen Completed By:  Clois Dupes, RN MSN 10/12/2023 4:02 PM ______________________________________________________________________   Discussed status with Dr. Shearon Stalls on 10/12/23 at 1601 and received approval for admission today.   Admission Coordinator:  Syniyah, Bourne, RN MSN time 1610 Date 10/12/23            Revision History

## 2023-10-12 NOTE — Discharge Summary (Addendum)
 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 s/p TNK Etiology: Likely embolic of cryptogenic source  Patient Active Problem List   Diagnosis Date Noted   NSVT (nonsustained ventricular tachycardia) (HCC) 10/12/2023   Acute combined systolic and diastolic heart failure (HCC) 10/11/2023   HFrEF (heart failure with reduced ejection fraction) (HCC) 10/10/2023   Acute ischemic stroke (HCC) 10/08/2023   MCI (mild cognitive impairment) 08/04/2022   Vitamin B12 deficiency    Osteoarthritis 08/03/2022   Hyperlipidemia 08/03/2022   History of COVID-19 2021   Essential hypertension 10/09/2017   Low back pain radiating to right leg 10/09/2017   Mild intermittent asthma with acute exacerbation 05/30/2017   S/P arthroscopy of right knee 06/03/2014   Right knee pain 03/13/2014   Diverticulitis of colon (without mention of hemorrhage) 09/17/2013   Postmenopausal atrophic vaginitis, severe 08/18/2008   Hypothyroidism 05/09/2007   ADHD (attention deficit hyperactivity disorder) 05/09/2007     Allergies as of 10/12/2023       Reactions   Ativan [lorazepam]    Agitation/paradoxical reaction.   Oxycodone-acetaminophen Nausea Only   Latex Hives, Rash        Medication List     STOP taking these medications    simvastatin 20 MG tablet Commonly known as: ZOCOR       TAKE these medications    amphetamine-dextroamphetamine 20 MG tablet Commonly known as: ADDERALL Take 1 tablet (20 mg total) by mouth 2 (two) times daily. What changed: when to take this   aspirin 81 MG chewable tablet Chew 1 tablet (81 mg total) by mouth daily. Start taking  on: October 13, 2023   atorvastatin 40 MG tablet Commonly known as: LIPITOR Take 1 tablet (40 mg total) by mouth daily. Start taking on: October 13, 2023   benzonatate 200 MG capsule Commonly known as: TESSALON Take 1 capsule (200 mg total) by mouth 3 (three) times daily as needed.   clopidogrel 75 MG tablet Commonly known as: PLAVIX Take 1 tablet (75 mg total) by mouth daily. Start taking on: October 13, 2023   cyanocobalamin 1000 MCG tablet Commonly known as: VITAMIN B12 Take 1,000 mcg by mouth 2 (two) times a week.   D3-50 PO Take 1 capsule by mouth daily.   Evening Primrose Oil 1000 MG Caps Take 1,000 mg by mouth in the morning and at bedtime.   ezetimibe 10 MG tablet Commonly known as: ZETIA TAKE 1 TABLET BY MOUTH DAILY What changed: when to take this   hydrochlorothiazide 25 MG tablet Commonly known as: HYDRODIURIL TAKE 1 TABLET BY MOUTH DAILY   losartan 25 MG tablet Commonly known as: COZAAR Take 0.5 tablets (12.5 mg total) by mouth daily. Start taking on: October 13, 2023   metFORMIN 500 MG tablet Commonly known as: GLUCOPHAGE TAKE 2 TABLETS(1000 MG) BY MOUTH TWICE DAILY WITH A MEAL   metoprolol succinate 25 MG 24 hr tablet Commonly known as: TOPROL-XL Take 1 tablet (25 mg total) by mouth daily. Start taking on: October 13, 2023   NAC 500 MG Caps Generic drug: Acetylcysteine Take 500 mg by mouth daily.   QUERCETIN COMPLEX IMMUNE PO Take 500 mg by  mouth daily.   senna-docusate 8.6-50 MG tablet Commonly known as: Senokot-S Take 1 tablet by mouth at bedtime as needed for mild constipation.   Synthroid 75 MCG tablet Generic drug: levothyroxine TAKE 1 TABLET BY MOUTH DAILY   vitamin C 1000 MG tablet Take 1,000 mg by mouth daily.   Zinc 20 MG Caps Take 20 mg by mouth daily.        LABORATORY STUDIES CBC    Component Value Date/Time   WBC 5.6 10/10/2023 0911   RBC 5.12 (H) 10/10/2023 0911   HGB 14.4 10/10/2023 0911   HCT 42.6 10/10/2023 0911    PLT 147 (L) 10/10/2023 0911   MCV 83.2 10/10/2023 0911   MCH 28.1 10/10/2023 0911   MCHC 33.8 10/10/2023 0911   RDW 12.5 10/10/2023 0911   LYMPHSABS 2.6 10/08/2023 1710   MONOABS 0.5 10/08/2023 1710   EOSABS 0.0 10/08/2023 1710   BASOSABS 0.0 10/08/2023 1710   CMP    Component Value Date/Time   NA 135 10/12/2023 0628   K 3.4 (L) 10/12/2023 0628   CL 101 10/12/2023 0628   CO2 23 10/12/2023 0628   GLUCOSE 103 (H) 10/12/2023 0628   BUN 13 10/12/2023 0628   CREATININE 0.68 10/12/2023 0628   CREATININE 0.80 02/05/2020 0857   CALCIUM 9.2 10/12/2023 0628   PROT 6.4 (L) 10/08/2023 1710   ALBUMIN 3.6 10/08/2023 1710   AST 24 10/08/2023 1710   ALT 17 10/08/2023 1710   ALKPHOS 73 10/08/2023 1710   BILITOT 0.5 10/08/2023 1710   GFRNONAA >60 10/12/2023 0628   GFRNONAA 74 02/05/2020 0857   GFRAA 86 02/05/2020 0857   COAGS Lab Results  Component Value Date   INR 1.0 10/08/2023   Lipid Panel    Component Value Date/Time   CHOL 102 10/09/2023 0712   TRIG 87 10/09/2023 0712   HDL 52 10/09/2023 0712   CHOLHDL 2.0 10/09/2023 0712   VLDL 17 10/09/2023 0712   LDLCALC 33 10/09/2023 0712   LDLCALC 110 (H) 02/05/2020 0857   HgbA1C  Lab Results  Component Value Date   HGBA1C 6.2 09/05/2023   Alcohol Level    Component Value Date/Time   ETH <10 10/08/2023 1710     SIGNIFICANT DIAGNOSTIC STUDIES MR BRAIN WO CONTRAST Result Date: 10/09/2023 CLINICAL DATA:  Stroke, follow up EXAM: MRI HEAD WITHOUT CONTRAST TECHNIQUE: Multiplanar, multiecho pulse sequences of the brain and surrounding structures were obtained without intravenous contrast. COMPARISON:  CT head October 08, 2023. MRI head November 26, 23. FINDINGS: Significantly motion limited study. Brain: Multiple punctate foci of restricted diffusion in bilateral frontal lobes and the left parietal lobe. No evidence of acute hemorrhage, mass lesion, midline shift or hydrocephalus. Cerebral atrophy. Vascular: Major arterial flow voids are  maintained at the skull base. Skull and upper cervical spine: Normal marrow signal. Sinuses/Orbits: Mostly clear sinuses. IMPRESSION: 1. Findings compatible with multiple punctate acute infarcts in bilateral frontal and left parietal lobes. Given involvement of multiple vascular territories, consider an embolic etiology. 2. Significantly motion limited study. Electronically Signed   By: Feliberto Harts M.D.   On: 10/09/2023 22:53   EEG adult Result Date: 10/09/2023 Charlsie Quest, MD     10/09/2023  4:41 PM [name] MRN: [id number] Epilepsy Attending: Charlsie Quest Referring Physician/Provider: Mathews Argyle, NP Date: 10/09/2023 Duration: 22.07 mins Patient history: 73 year old lady presented with sudden onset of speech difficulties and left hemiparesis. EEG to evaluate for seizure Level of alertness: comatose/  lethargic AEDs during EEG study: None Technical aspects: This EEG study was done with scalp electrodes positioned according to the 10-20 International system of electrode placement. Electrical activity was reviewed with band pass filter of 1-70Hz , sensitivity of 7 uV/mm, display speed of 3mm/sec with a 60Hz  notched filter applied as appropriate. EEG data were recorded continuously and digitally stored.  Video monitoring was available and reviewed as appropriate. Description: EEG showed continuous generalized polymorphic 3 to 6 Hz theta-delta slowing. Hyperventilation and photic stimulation were not performed.   ABNORMALITY - Continuous slow, generalized IMPRESSION: This study is suggestive of moderate diffuse encephalopathy. No seizures or epileptiform discharges were seen throughout the recording. Charlsie Quest   ECHOCARDIOGRAM COMPLETE Result Date: 10/09/2023    ECHOCARDIOGRAM REPORT   Patient Name:   Gabrielle Vargas Date of Exam: 10/09/2023 Medical Rec #:  [id number]            Height:       59.0 in Accession #:    [id number]           Weight:       109.3 lb  Date of Birth:  Jun 07, 1951            BSA:          1.427 m Patient Age:    73 years             BP:           118/71 mmHg Patient Gender: F                    HR:           73 bpm. Exam Location:  Inpatient Procedure: 2D Echo, Cardiac Doppler, Color Doppler and Intracardiac            Opacification Agent (Both Spectral and Color Flow Doppler were            utilized during procedure). Indications:    Stroke I63.9  History:        Patient has no prior history of Echocardiogram examinations.                 Risk Factors:Hypertension and Dyslipidemia.  Sonographer:    Harriette Bouillon RDCS Referring Phys: [phone] ERIC LINDZEN IMPRESSIONS  1. Left ventricular ejection fraction, by estimation, is 30 to 35%. The left ventricle has moderately decreased function. The left ventricle demonstrates regional wall motion abnormalities (see scoring diagram/findings for description). The left ventricular internal cavity size was mildly dilated. Left ventricular diastolic parameters are consistent with Grade I diastolic dysfunction (impaired relaxation).  2. Right ventricular systolic function is normal. The right ventricular size is normal. There is normal pulmonary artery systolic pressure.  3. The mitral valve is normal in structure. Mild mitral valve regurgitation. No evidence of mitral stenosis.  4. The aortic valve is tricuspid. There is mild calcification of the aortic valve. Aortic valve regurgitation is mild. No aortic stenosis is present.  5. The inferior vena cava is normal in size with greater than 50% respiratory variability, suggesting right atrial pressure of 3 mmHg. Conclusion(s)/Recommendation(s): No obvious LV thrombus with Definity contrast however given apical and mid to distal septal WMAs patient seems to be at high risk for cardio-embolic events. FINDINGS  Left Ventricle: Left ventricular ejection fraction, by estimation, is 30 to 35%. The left ventricle has moderately decreased function. The left ventricle  demonstrates regional wall motion abnormalities. The left ventricular internal cavity size was mildly dilated. There  is no left ventricular hypertrophy. Left ventricular diastolic parameters are consistent with Grade I diastolic dysfunction (impaired relaxation).  LV Wall Scoring: The mid and distal anterior septum and mid inferoseptal segment are dyskinetic. The apical inferior segment is akinetic. The apical anterior segment and apex are hypokinetic. Right Ventricle: The right ventricular size is normal. No increase in right ventricular wall thickness. Right ventricular systolic function is normal. There is normal pulmonary artery systolic pressure. The tricuspid regurgitant velocity is 1.88 m/s, and  with an assumed right atrial pressure of 3 mmHg, the estimated right ventricular systolic pressure is 17.1 mmHg. Left Atrium: Left atrial size was normal in size. Right Atrium: Right atrial size was normal in size. Pericardium: There is no evidence of pericardial effusion. Mitral Valve: The mitral valve is normal in structure. Mild mitral valve regurgitation. No evidence of mitral valve stenosis. Tricuspid Valve: The tricuspid valve is normal in structure. Tricuspid valve regurgitation is trivial. No evidence of tricuspid stenosis. Aortic Valve: The aortic valve is tricuspid. There is mild calcification of the aortic valve. Aortic valve regurgitation is mild. No aortic stenosis is present. Pulmonic Valve: The pulmonic valve was normal in structure. Pulmonic valve regurgitation is not visualized. No evidence of pulmonic stenosis. Aorta: The aortic root is normal in size and structure. Venous: The inferior vena cava is normal in size with greater than 50% respiratory variability, suggesting right atrial pressure of 3 mmHg. IAS/Shunts: No atrial level shunt detected by color flow Doppler.  LEFT VENTRICLE PLAX 2D LVIDd:         4.10 cm   Diastology LVIDs:         3.30 cm   LV e' medial:    5.11 cm/s LV PW:         0.60  cm   LV E/e' medial:  13.0 LV IVS:        0.70 cm   LV e' lateral:   7.83 cm/s LVOT diam:     1.80 cm   LV E/e' lateral: 8.5 LV SV:         51 LV SV Index:   36 LVOT Area:     2.54 cm  RIGHT VENTRICLE         IVC TAPSE (M-mode): 1.9 cm  IVC diam: 1.00 cm LEFT ATRIUM           Index       RIGHT ATRIUM          Index LA diam:      2.60 cm 1.82 cm/m  RA Area:     9.00 cm LA Vol (A4C): 12.4 ml 8.69 ml/m  RA Volume:   20.20 ml 14.16 ml/m  AORTIC VALVE             PULMONIC VALVE LVOT Vmax:   107.00 cm/s PR End Diast Vel: 0.14 msec LVOT Vmean:  79.600 cm/s LVOT VTI:    0.202 m  AORTA Ao Root diam: 3.00 cm MITRAL VALVE                TRICUSPID VALVE MV Area (PHT): 4.29 cm     TR Peak grad:   14.1 mmHg MV Decel Time: 177 msec     TR Vmax:        188.00 cm/s MV E velocity: 66.60 cm/s MV A velocity: 107.00 cm/s  SHUNTS MV E/A ratio:  0.62         Systemic VTI:  0.20 m  Systemic Diam: 1.80 cm Arvilla Meres MD Electronically signed by Arvilla Meres MD Signature Date/Time: 10/09/2023/11:20:20 AM    Final    CT Head Wo Contrast Result Date: 10/08/2023 CLINICAL DATA:  Stroke-like symptoms EXAM: CT HEAD WITHOUT CONTRAST TECHNIQUE: Contiguous axial images were obtained from the base of the skull through the vertex without intravenous contrast. RADIATION DOSE REDUCTION: This exam was performed according to the departmental dose-optimization program which includes automated exposure control, adjustment of the mA and/or kV according to patient size and/or use of iterative reconstruction technique. COMPARISON:  09/10/2023 FINDINGS: Brain: No evidence of acute infarction, hemorrhage, hydrocephalus, extra-axial collection or mass lesion/mass effect. Mild atrophic changes are noted commensurate with the patient's given age. Vascular: No hyperdense vessel or unexpected calcification. Skull: Normal. Negative for fracture or focal lesion. Sinuses/Orbits: No acute finding. Other: None. IMPRESSION:  Chronic atrophic changes without acute abnormality. Electronically Signed   By: Alcide Clever M.D.   On: 10/08/2023 19:18   CT ANGIO HEAD NECK W WO CM (CODE STROKE) Result Date: 10/08/2023 CLINICAL DATA:  Code stroke EXAM: CT ANGIOGRAPHY HEAD AND NECK WITH AND WITHOUT CONTRAST TECHNIQUE: Multidetector CT imaging of the head and neck was performed using the standard protocol during bolus administration of intravenous contrast. Multiplanar CT image reconstructions and MIPs were obtained to evaluate the vascular anatomy. Carotid stenosis measurements (when applicable) are obtained utilizing NASCET criteria, using the distal internal carotid diameter as the denominator. RADIATION DOSE REDUCTION: This exam was performed according to the departmental dose-optimization program which includes automated exposure control, adjustment of the mA and/or kV according to patient size and/or use of iterative reconstruction technique. CONTRAST:  75mL OMNIPAQUE IOHEXOL 350 MG/ML SOLN COMPARISON:  MR angio head 06/12/2022. FINDINGS: CTA NECK FINDINGS Aortic arch: A 3 vessel arch configuration is present. Minimal calcifications are present at the distal arch. No significant scratched at the great vessel origins are widely patent. No aneurysm or dissection is present. Right carotid system: The right common carotid artery is within normal limits. The bifurcation is unremarkable. Mild tortuosity is present in the distal cervical right ICA without focal stenosis. Left carotid system: Left common carotid artery is within normal limits. Minimal atherosclerotic changes present at the proximal left ICA without significant stenosis. Cervical left ICA is otherwise normal. Vertebral arteries: The right vertebral artery is dominant. Both vertebral arteries originate from the subclavian arteries without significant stenosis. No significant stenosis is present in either vertebral artery in the neck. Skeleton: Cervical fusion is noted at C4-5 and  C5-6. Slight degenerative anterolisthesis is present at C2-3 and C3-4. Other neck: The soft tissues of the neck are otherwise unremarkable. Salivary glands are within normal limits. Thyroid is normal. No significant adenopathy is present. No focal mucosal or submucosal lesions are present. Upper chest: The lung apices are clear. The thoracic inlet is within normal limits. 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 The internal carotid arteries are otherwise within normal limits from the skull base to the ICA termini. The A1 and M1 segments are normal. The anterior communicating artery is patent. The MCA bifurcations are normal bilaterally. There is some attenuation of distal MCA branch vessels bilaterally without a significant proximal stenosis or occlusion. No other aneurysm is present. Posterior circulation: The right vertebral artery is dominant. The PICA origins are visualized and normal. The left vertebral artery is centrally terminates at the PICA. The  basilar artery is within normal limits. The superior cerebellar arteries are patent bilaterally. The right posterior cerebral artery Jeanette some basilar tip. Left posterior cerebral artery is of fetal type. The PCA branch vessels are normal bilaterally. Venous sinuses: The dural sinuses are patent. The straight sinus and deep cerebral veins are intact. Cortical veins are within normal limits. No significant vascular malformation is evident. Anatomic variants: Fetal type left posterior cerebral artery. Review of the MIP images confirms the above findings IMPRESSION: 1. 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 2. No significant proximal stenosis, aneurysm, or branch vessel occlusion within the Circle of Willis. 3. Minimal atherosclerotic change at the proximal left ICA without significant  stenosis. 4. Cervical fusion at C4-5 and C5-6. 5. Slight degenerative anterolisthesis at C2-3 and C3-4. 6.  Aortic Atherosclerosis (ICD10-I70.0). These results were called by telephone at the time of interpretation on 10/08/2023 at 5:33 pm to provider ERIC [facility] , who verbally acknowledged these results. Electronically Signed   By: Marin Roberts M.D.   On: 10/08/2023 17:46   CT HEAD CODE STROKE WO CONTRAST Result Date: 10/08/2023 CLINICAL DATA:  Code stroke. Neuro deficit, acute, stroke suspected. EXAM: CT HEAD WITHOUT CONTRAST TECHNIQUE: Contiguous axial images were obtained from the base of the skull through the vertex without intravenous contrast. RADIATION DOSE REDUCTION: This exam was performed according to the departmental dose-optimization program which includes automated exposure control, adjustment of the mA and/or kV according to patient size and/or use of iterative reconstruction technique. COMPARISON:  MR head without contrast 06/12/2022. FINDINGS: Brain: No acute infarct, hemorrhage, or mass lesion is present. No significant white matter lesions are present. Deep brain nuclei are within normal limits. The ventricles are of normal size. No significant extraaxial fluid collection is present. The brainstem and cerebellum are within normal limits. Midline structures are within normal limits. Vascular: Atherosclerotic calcifications are present within the cavernous internal carotid arteries bilaterally. No hyperdense vessel is present. Skull: Calvarium is intact. No focal lytic or blastic lesions are present. No significant extracranial soft tissue lesion is present. Sinuses/Orbits: The paranasal sinuses and mastoid air cells are clear. The globes and orbits are within normal limits. ASPECTS [facility] Stroke Program Early CT Score) - Ganglionic level infarction (caudate, lentiform nuclei, internal capsule, insula, M1-M3 cortex): 7/7 - Supraganglionic infarction (M4-M6 cortex): 3/3 Total score (0-10  with 10 being normal): 10/10 IMPRESSION: 1. Normal CT appearance of the brain. 2. Aspects is 10/10. These results were called by telephone at the time of interpretation on 10/08/2023 at 5:34 pm to provider Dr. Otelia Limes, who verbally acknowledged these results. Electronically Signed   By: Marin Roberts M.D.   On: 10/08/2023 17:35       HISTORY OF PRESENT ILLNESS 73 y.o. patient with history of ADHD, HTN, diverticulitis, HLD, hypothyroidism, low back pain, cervical neck fusion, mild cognitive impairment, low back pain radiating to right leg, mild intermittent asthma, osteoarthritis, right knee pain and vitamin B12 deficiency who presents to the ED from home admitted for acute onset of left sided weakness and dysphasia. Received IV TNK at 1800 on 3/23. NIH on Admission 8. NIH at discharge 2.  HOSPITAL COURSE Code Stroke CT head No acute abnormality. ASPECTS 10.    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 No significant proximal stenosis, aneurysm, or branch vessel occlusion within the Circle of Willis. Minimal atherosclerotic change at the proximal left ICA without significant stenosis. Aortic atherosclerosis  24-hour  CT head: Chronic atrophy changes, no acute abnormality, no hemorrhage. MRI: Multiple punctate acute infarcts bilateral frontal and left parietal lobes Routine EEG: Suggestive of moderate diffuse encephalopathy. No seizures or epileptiform discharges were seen throughout the recording  2D Echo EF 30 to 35%.  LV with grade 1 diastolic dysfunction-cardiology following  need loop recorder placed on discharge LDL 80 HgbA1c 6.2 VTE prophylaxis -SCDs No antithrombotic prior to admission, now on aspirin 81 mg and Plavix 75 mg for 3 weeks, then aspirin alone.  Discussed with cardiology no need for anticoagulation at this time Therapy recommendations:  CIR   Hypertension CHF Home meds: HCTZ 25 mg Stable Blood Pressure Goal: BP less than  180/105  2D Echo EF 30 to 35% Cardiology following begin GDMT.  Noted on Toprol XL 25 mg today   Hyperlipidemia Home meds: Simvastatin 20 mg, Zetia 10 mg,  resumed in hospital LDL 80, goal < 70 Change simvastatin to atorvastatin 40 mg Continue statin at discharge   Diabetes melitis controlled  A1c 6.2 On metformin at home SSI Close follow-up with primary care   Hypothyroidism Continue home levothyroxine 75 mcg  Other Active Problems ADHD on Adderall B12 deficiency Mild cognitive impairment   DISCHARGE EXAM  PHYSICAL EXAM General:  Alert, well-nourished, well-developed patient in no acute distress Psych:  Mood and affect appropriate for situation CV: Regular rate and rhythm on monitor Respiratory:  Regular, unlabored respirations on room air GI: Abdomen soft and nontender  NEURO:  Mental Status: Awake alert and oriented.  She is oriented to self and to place.  She follows simple commands. Speech/language: No dysarthria or aphasia noted.     Cranial Nerves:  II: PERRL. Decreased left visual field.  III, IV, VI: EOMI. Eyelids elevate symmetrically.  V: Sensation is intact to light touch and symmetrical to face.  VII: Left facial droop VIII: hearing intact to voice. IX, X: Palate elevates symmetrically. Phonation is normal.  MW:[id number] shrug 5/5. XII: tongue is midline without fasciculations. Motor:  Generalized weakness with no drift.  No focal deficits Tone: is normal and bulk is normal Sensation- no deficits seen. Coordination: Unable to assess Gait- deferred  1a Level of Conscious.: 0 1b LOC Questions: 0 1c LOC Commands: 0 2 Best Gaze: 0 3 Visual: 1 4 Facial Palsy: 1 5a Motor Arm - left: 0 5b Motor Arm - Right: 0 6a Motor Leg - Left: 0 6b Motor Leg - Right: 0 7 Limb Ataxia: 0 8 Sensory: 0 9 Best Language: 0 10 Dysarthria: 0 11 Extinct. and Inatten.: 0 TOTAL: 2   Discharge Diet       Diet   Diet regular Room service appropriate? Yes with  Assist; Fluid consistency: Thin   liquids  DISCHARGE PLAN Disposition: Rehab aspirin 81 mg daily and clopidogrel 75 mg daily for secondary stroke prevention for 3 weeks then aspirin 81 mg daily alone. Ongoing stroke risk factor control by Primary Care Physician at time of discharge Follow-up PCP Nafziger, Kandee Keen, NP in 2 weeks. Follow up with Cardiology outpatient  Follow-up in [facility] in 8 weeks, office to schedule an appointment. Able to see NP in clinic.  33 minutes were spent preparing discharge.  Gevena Mart DNP, ACNPC-AG  [facility]  I have personally obtained history,examined this patient, reviewed notes, independently viewed imaging studies, participated in medical decision making and plan of care.ROS completed by me personally and pertinent positives fully documented  I have made any additions or clarifications directly to  the above note. Agree with note above.    Delia Heady, MD Medical Director [facility] Pager: [phone] 10/12/2023 5:42 PM

## 2023-10-12 NOTE — Progress Notes (Signed)
 STROKE TEAM PROGRESS NOTE    Received TNK 3/23 1800  INTERIM HISTORY/SUBJECTIVE  Husband is at the bedside.  No new neurological events overnight Patient is awake alert and following commands and speech is improving Neurological exam is improving vital signs stable.  Therapies recommend inpatient rehab  OBJECTIVE  CBC    Component Value Date/Time   WBC 5.6 10/10/2023 0911   RBC 5.12 (H) 10/10/2023 0911   HGB 14.4 10/10/2023 0911   HCT 42.6 10/10/2023 0911   PLT 147 (L) 10/10/2023 0911   MCV 83.2 10/10/2023 0911   MCH 28.1 10/10/2023 0911   MCHC 33.8 10/10/2023 0911   RDW 12.5 10/10/2023 0911   LYMPHSABS 2.6 10/08/2023 1710   MONOABS 0.5 10/08/2023 1710   EOSABS 0.0 10/08/2023 1710   BASOSABS 0.0 10/08/2023 1710    BMET    Component Value Date/Time   NA 135 10/12/2023 0628   K 3.4 (L) 10/12/2023 0628   CL 101 10/12/2023 0628   CO2 23 10/12/2023 0628   GLUCOSE 103 (H) 10/12/2023 0628   BUN 13 10/12/2023 0628   CREATININE 0.68 10/12/2023 0628   CREATININE 0.80 02/05/2020 0857   CALCIUM 9.2 10/12/2023 0628   GFRNONAA >60 10/12/2023 0628   GFRNONAA 74 02/05/2020 0857    IMAGING past 24 hours No results found.   Vitals:   10/11/23 2316 10/12/23 0407 10/12/23 0757 10/12/23 1114  BP: 111/70 111/75 102/69 116/76  Pulse: (!) 59 (!) 59 (!) 58 (!) 58  Resp: 18 16 16    Temp: 98.3 F (36.8 C) 98.2 F (36.8 C) 98 F (36.7 C) (!) 97.3 F (36.3 C)  TempSrc: Axillary Oral Oral Oral  SpO2: 94% 94% 94% 94%  Weight:      Height:         PHYSICAL EXAM General: Well-developed well-nourished female in no apparent distress Psych: Unable to assess CV: Regular rate and rhythm on monitor Respiratory:  Regular, unlabored respirations on room air GI: Abdomen soft and nontender   NEURO:  Mental Status: Awake alert and oriented.  She is oriented to self and to place.  She follows simple commands. Speech/language: No dysarthria or aphasia noted.    Cranial Nerves:  II:  PERRL. Decreased left visual field.  III, IV, VI: EOMI. Eyelids elevate symmetrically.  V: Sensation is intact to light touch and symmetrical to face.  VII: Left facial droop VIII: hearing intact to voice. IX, X: Palate elevates symmetrically. Phonation is normal.  UJ:[id number] shrug 5/5. XII: tongue is midline without fasciculations. Motor:  Generalized weakness with no drift.  No focal deficits Tone: is normal and bulk is normal Sensation- no deficits seen. Coordination: Unable to assess Gait- deferred  Most Recent NIH 2  ASSESSMENT/PLAN  Gabrielle Vargas is a 73 y.o. female with history of ADHD, HTN, diverticulitis, HLD, hypothyroidism, low back pain, cervical neck fusion, mild cognitive impairment, low back pain radiating to right leg, mild intermittent asthma, osteoarthritis, right knee pain and vitamin B12 deficiency who presents to the ED from home  admitted for acute onset of left sided weakness and dysphasia.  Received IV TNK at 1800 on 3/23.  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 ASPECTS 10.    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 No significant proximal stenosis, aneurysm, or branch vessel occlusion within the Circle  of Willis. Minimal atherosclerotic change at the proximal left ICA without significant stenosis. Aortic atherosclerosis  24-hour CT head: Chronic atrophy changes, no acute abnormality, no hemorrhage. MRI: Multiple punctate acute infarcts bilateral frontal and left parietal lobes Routine EEG: Suggestive of moderate diffuse encephalopathy. No seizures or epileptiform discharges were seen throughout the recording  2D Echo EF 30 to 35%.  LV with grade 1 diastolic dysfunction-cardiology following Will likely need loop recorder on discharge LDL 80 HgbA1c 6.2 VTE  prophylaxis -SCDs No antithrombotic prior to admission, now on aspirin 81 mg and Plavix 75 mg for 3 weeks, then aspirin alone.  Discussed with cardiology no need for anticoagulation at this time Therapy recommendations:  CIR Disposition: Pending  Hypertension CHF Home meds: HCTZ 25 mg Stable Blood Pressure Goal: BP less than 180/105  2D Echo EF 30 to 35% Cardiology following begin GDMT.  Noted on Toprol XL 25 mg today  Hyperlipidemia Home meds: Simvastatin 20 mg, Zetia 10 mg,  resumed in hospital LDL 80, goal < 70 Change simvastatin to atorvastatin 40 mg Continue statin at discharge  Diabetes melitis controlled  A1c 6.2 On metformin at home SSI Close follow-up with primary care  Hypothyroidism Continue home levothyroxine 75 mcg  Other Stroke Risk Factors Congestive heart failure  Other Active Problems ADHD on Adderall B12 deficiency Mild cognitive impairment  Hospital day # 4   Pt seen by Neuro NP/APP and later by MD. Note/plan to be edited by MD as needed.       Patient does show neurological improvement but still has mild slurred speech and facial weakness.  Mobilize out of bed.  Physical occupational speech therapy consults.  Transfer to inpatient rehab when bed available.  She will need loop recorder at discharge to look for paroxysmal A-fib.  Continue dual antiplatelet therapy for 3 weeks and then aspirin alone.  Discussed with cardiology.  Discussed with patient's family at the bedside and answered questions.  Greater than 50% time during this 35-minute visit was spent in counseling and coordination of care and discussion patient and care team answering questions.  Delia Heady, MD Medical Director [facility] Pager: [phone] 10/12/2023 1:48 PM \   To contact Stroke Continuity provider, please refer to [email or web address]. After hours, contact General Neurology

## 2023-10-12 NOTE — Progress Notes (Signed)
 Physical Therapy Treatment Patient Details Name: Gabrielle Vargas MRN: [id number] DOB: 26-Aug-1950 Today's Date: 10/12/2023   History of Present Illness 73 y/o F presents to [facility] on 3/23 with CC of acute onset of L sided weakness, L facial droop, aphasia, L side visual cut, and dysphasia. 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 PMH includes: ADHD, HTN, diverticulitis, HLD, hypothyroidism, low back pain, cervical neck fusion, mild cognitive impairment, low back pain radiating to right leg, mild intermittent asthma, osteoarthritis, right knee pain and vitamin B12 deficiency.    PT Comments  Pt tolerated treatment well today. Trialed RW for ambulation today which pt had fair tolerance however pt required Min A for safety with constant cues for proximity to RW and navigating obstacles on L side. No change in DC/DME recs at this time. Pt anticipates DC to CIR today.     If plan is discharge home, recommend the following: A little help with walking and/or transfers;A little help with bathing/dressing/bathroom;Assistance with cooking/housework;Direct supervision/assist for medications management;Direct supervision/assist for financial management;Assist for transportation;Help with stairs or ramp for entrance;Supervision due to cognitive status   Can travel by private vehicle        Equipment Recommendations  None recommended by PT    Recommendations for Other Services       Precautions / Restrictions Precautions Precautions: Fall Recall of Precautions/Restrictions: Intact Restrictions Weight Bearing Restrictions Per Provider Order: No     Mobility  Bed Mobility               General bed mobility comments: Up in chair eating lunch    Transfers Overall transfer level: Needs assistance Equipment used: 2  person hand held assist Transfers: Sit to/from Stand Sit to Stand: Min assist           General transfer comment: +2 Min A to ensure safety with sit > stand. Patient uses LE support to power up from a seated position.    Ambulation/Gait Ambulation/Gait assistance: Min assist Gait Distance (Feet): 200 Feet Assistive device: Rolling walker (2 wheels) Gait Pattern/deviations: Drifts right/left, Trunk flexed, Decreased stride length, Step-through pattern Gait velocity: Decreased     General Gait Details: Min A due to pt requiring constant cues for promixity and safety with RW. Pt still with L sided neglect and needed Min A to navigate obstacles in L side.   Stairs             Wheelchair Mobility     Tilt Bed    Modified Rankin (Stroke Patients Only) Modified Rankin (Stroke Patients Only) Pre-Morbid Rankin Score: No symptoms Modified Rankin: Moderately severe disability     Balance Overall balance assessment: Needs assistance Sitting-balance support: Feet supported Sitting balance-Leahy Scale: Fair     Standing balance support: Bilateral upper extremity supported Standing balance-Leahy Scale: Poor Standing balance comment: RW                            Communication Communication Communication: No apparent difficulties  Cognition Arousal: Alert Behavior During Therapy: Flat affect                           PT - Cognition Comments: Pt noted with flat affect and some slow processing. Upon returning to room pt stated "this isnt  my room but whatever you say" Following commands: Impaired Following commands impaired: Follows one step commands inconsistently    Cueing Cueing Techniques: Verbal cues, Gestural cues, Tactile cues, Visual cues  Exercises      General Comments General comments (skin integrity, edema, etc.): VSS      Pertinent Vitals/Pain Pain Assessment Pain Assessment: No/denies pain    Home Living                           Prior Function            PT Goals (current goals can now be found in the care plan section) Progress towards PT goals: Progressing toward goals    Frequency    Min 3X/week      PT Plan      Co-evaluation              AM-PAC PT "6 Clicks" Mobility   Outcome Measure  Help needed turning from your back to your side while in a flat bed without using bedrails?: A Lot Help needed moving from lying on your back to sitting on the side of a flat bed without using bedrails?: A Lot Help needed moving to and from a bed to a chair (including a wheelchair)?: A Little Help needed standing up from a chair using your arms (e.g., wheelchair or bedside chair)?: A Little Help needed to walk in hospital room?: A Little Help needed climbing 3-5 steps with a railing? : A Lot 6 Click Score: 15    End of Session Equipment Utilized During Treatment: Gait belt Activity Tolerance: Patient tolerated treatment well;Patient limited by lethargy Patient left: in chair;with call bell/phone within reach;with chair alarm set;with family/visitor present Nurse Communication: Mobility status PT Visit Diagnosis: Unsteadiness on feet (R26.81);History of falling (Z91.81);Apraxia (R48.2);Other symptoms and signs involving the nervous system (R29.898)     Time: 0981-1914 PT Time Calculation (min) (ACUTE ONLY): 14 min  Charges:    $Gait Training: 8-22 mins PT General Charges $$ ACUTE PT VISIT: 1 Visit                     Gabrielle Vargas, PT, DPT Acute Rehab Services [phone]    Gabrielle Vargas 10/12/2023, 3:44 PM

## 2023-10-12 NOTE — TOC Transition Note (Signed)
 Transition of Care [facility]) - Discharge Note   Patient Details  Name: ALLEE BUSK MRN: [id number] Date of Birth: 1951-03-22  [facility] CM/SW Contact:  Kermit Balo, RN Phone Number: 10/12/2023, 3:43 PM   Clinical Narrative:     Pt is discharging to CIR today. No further needs per TOC.  Final next level of care: IP Rehab Facility Barriers to Discharge: No Barriers Identified   Patient Goals and CMS Choice   CMS Medicare.gov Compare Post Acute Care list provided to:: Patient Choice offered to / list presented to : Patient      Discharge Placement                       Discharge Plan and Services Additional resources added to the After Visit Summary for                                       Social Drivers of Health (SDOH) Interventions SDOH Screenings   Food Insecurity: Patient Unable To Answer (10/09/2023)  Housing: Patient Unable To Answer (10/09/2023)  Transportation Needs: Patient Unable To Answer (10/09/2023)  Utilities: Patient Unable To Answer (10/09/2023)  Alcohol Screen: Low Risk  (12/23/2022)  Depression (PHQ2-9): Low Risk  (12/23/2022)  Financial Resource Strain: Low Risk  (12/23/2022)  Physical Activity: Sufficiently Active (12/23/2022)  Social Connections: Patient Unable To Answer (10/09/2023)  Stress: No Stress Concern Present (12/23/2022)  Tobacco Use: Low Risk  (10/08/2023)     Readmission Risk Interventions     No data to display

## 2023-10-12 NOTE — Evaluation (Signed)
 Occupational Therapy Assessment and Plan  Patient Details  Name: Gabrielle Vargas MRN: [id number] Date of Birth: [birth year]  OT Diagnosis: {diagnoses:3041644} Rehab Potential:   ELOS:     {CHL IP REHAB OT TIME CALCULATIONS:304400400}    Hospital Problem: Principal Problem:   CVA (cerebral vascular accident) [facility])   Past Medical History:  Past Medical History:  Diagnosis Date   ADHD (attention deficit hyperactivity disorder) 05/09/2007   Diverticulitis of colon (without mention of hemorrhage) 09/17/2013   Essential hypertension 10/09/2017   History of COVID-19 2021   Hyperlipidemia    Hypothyroidism    Low back pain radiating to right leg 10/09/2017   Mild cognitive impairment of uncertain or unknown etiology 08/04/2022   Mild intermittent asthma with acute exacerbation 05/30/2017   Osteoarthritis    Postmenopausal atrophic vaginitis, severe 08/18/2008   Right knee pain 03/13/2014   S/P arthroscopy of right knee 06/03/2014   Vitamin B12 deficiency    Past Surgical History:  Past Surgical History:  Procedure Laterality Date   ABDOMINAL HYSTERECTOMY     BREAST SURGERY     CARPAL TUNNEL RELEASE Bilateral    CERVICAL DISC ARTHROPLASTY     CESAREAN SECTION     x2   left shoulder surgery     MENISCUS REPAIR Right 2017   REDUCTION MAMMAPLASTY Bilateral    right shoulder     TONSILLECTOMY      Assessment & Plan Clinical Impression: Patient is a 73 y.o. year old female with recent admission to the hospital on *** with ***.  Patient transferred to CIR on 10/12/2023 .    Patient currently requires {WUJ:8119147} with {WGN:5621308} secondary to {MVHQIONGEXB:2841324}.  Prior to hospitalization, patient could complete *** with {MWN:0272536}.  Patient will benefit from skilled intervention to {benefit of skilled intervention:3041641} prior to discharge {discharge:3041642}.  Anticipate patient will require {supervision/assistance:22779} and {follow UY:4034742}.      OT  Evaluation Precautions/Restrictions    Pain Pain Assessment Pain Score: 0-No pain Home Living/Prior Functioning   Vision   Perception    Praxis   Cognition   Sensation   Motor     Trunk/Postural Assessment     Balance   Extremity/Trunk Assessment      Care Tool Care Tool Self Care Eating        Oral Care         Bathing              Upper Body Dressing(including orthotics)            Lower Body Dressing (excluding footwear)          Putting on/Taking off footwear             Care Tool Toileting Toileting activity         Care Tool Bed Mobility Roll left and right activity        Sit to lying activity        Lying to sitting on side of bed activity         Care Tool Transfers Sit to stand transfer        Chair/bed transfer         Toilet transfer         Care Tool Cognition  Expression of Ideas and Wants    Understanding Verbal and Non-Verbal Content     Memory/Recall Ability     Refer to Care Plan for Long Term Goals  SHORT TERM GOAL WEEK 1    Recommendations  for other services: {RECOMMENDATIONS FOR OTHER SERVICES:3049016}   Skilled Therapeutic Intervention ADL   Mobility    Skilled Intervention  Discharge Criteria: Patient will be discharged from OT if patient refuses treatment 3 consecutive times without medical reason, if treatment goals not met, if there is a change in medical status, if patient makes no progress towards goals or if patient is discharged from hospital.  The above assessment, treatment plan, treatment alternatives and goals were discussed and mutually agreed upon: {Assessment/Treatment Plan Discussed/Agreed:3049017}  Limmie Patricia, OTR/L,CBIS  Supplemental OT - [facility] and [facility] Secure Chat Preferred   10/12/2023, 9:41 PM

## 2023-10-12 NOTE — Progress Notes (Signed)
 Inpatient Rehabilitation Admissions Coordinator   I have insurance approval and CIR bed to admit her today after LOOP placed. Cardiology, Dr Pearlean Brownie, acute team and [facility] made aware. I met with patient and family at bedside and I will make the arrangements.  Ottie Glazier, RN, MSN Rehab Admissions Coordinator [phone] 10/12/2023 3:58 PM

## 2023-10-12 NOTE — Plan of Care (Signed)
  Problem: Education: Goal: Knowledge of disease or condition will improve Outcome: Progressing   Problem: Ischemic Stroke/TIA Tissue Perfusion: Goal: Complications of ischemic stroke/TIA will be minimized Outcome: Progressing   Problem: Self-Care: Goal: Ability to communicate needs accurately will improve Outcome: Progressing   Problem: Nutrition: Goal: Risk of aspiration will decrease Outcome: Progressing   Problem: Nutrition: Goal: Dietary intake will improve Outcome: Progressing   Problem: Activity: Goal: Risk for activity intolerance will decrease Outcome: Progressing   Problem: Safety: Goal: Non-violent Restraint(s) Outcome: Progressing

## 2023-10-13 ENCOUNTER — Encounter (HOSPITAL_COMMUNITY): Payer: Self-pay | Admitting: Cardiology

## 2023-10-13 ENCOUNTER — Other Ambulatory Visit: Payer: Self-pay

## 2023-10-13 DIAGNOSIS — I6340 Cerebral infarction due to embolism of unspecified cerebral artery: Secondary | ICD-10-CM

## 2023-10-13 LAB — GLUCOSE, CAPILLARY
Glucose-Capillary: 99 mg/dL (ref 70–99)
Glucose-Capillary: 107 mg/dL — ABNORMAL HIGH (ref 70–99)
Glucose-Capillary: 125 mg/dL — ABNORMAL HIGH (ref 70–99)
Glucose-Capillary: 143 mg/dL — ABNORMAL HIGH (ref 70–99)

## 2023-10-13 LAB — CBC WITH DIFFERENTIAL/PLATELET
Abs Immature Granulocytes: 0.04 10*3/uL (ref 0.00–0.07)
Basophils Absolute: 0 10*3/uL (ref 0.0–0.1)
Basophils Relative: 1 %
Eosinophils Absolute: 0.1 10*3/uL (ref 0.0–0.5)
Eosinophils Relative: 1 %
HCT: 39.9 % (ref 36.0–46.0)
Hemoglobin: 13.8 g/dL (ref 12.0–15.0)
Immature Granulocytes: 1 %
Lymphocytes Relative: 38 %
Lymphs Abs: 2.3 10*3/uL (ref 0.7–4.0)
MCH: 28.3 pg (ref 26.0–34.0)
MCHC: 34.6 g/dL (ref 30.0–36.0)
MCV: 81.8 fL (ref 80.0–100.0)
Monocytes Absolute: 0.5 10*3/uL (ref 0.1–1.0)
Monocytes Relative: 8 %
Neutro Abs: 3.2 10*3/uL (ref 1.7–7.7)
Neutrophils Relative %: 51 %
Platelets: 187 10*3/uL (ref 150–400)
RBC: 4.88 MIL/uL (ref 3.87–5.11)
RDW: 12.6 % (ref 11.5–15.5)
WBC: 6.2 10*3/uL (ref 4.0–10.5)
nRBC: 0 % (ref 0.0–0.2)

## 2023-10-13 LAB — COMPREHENSIVE METABOLIC PANEL WITH GFR
ALT: 29 U/L (ref 0–44)
AST: 29 U/L (ref 15–41)
Albumin: 3.2 g/dL — ABNORMAL LOW (ref 3.5–5.0)
Alkaline Phosphatase: 70 U/L (ref 38–126)
Anion gap: 11 (ref 5–15)
BUN: 13 mg/dL (ref 8–23)
CO2: 23 mmol/L (ref 22–32)
Calcium: 9 mg/dL (ref 8.9–10.3)
Chloride: 103 mmol/L (ref 98–111)
Creatinine, Ser: 0.6 mg/dL (ref 0.44–1.00)
GFR, Estimated: >60 mL/min (ref >60)
Glucose, Bld: 103 mg/dL — ABNORMAL HIGH (ref 70–99)
Potassium: 4 mmol/L (ref 3.5–5.1)
Sodium: 137 mmol/L (ref 135–145)
Total Bilirubin: 0.7 mg/dL (ref 0.0–1.2)
Total Protein: 6.1 g/dL — ABNORMAL LOW (ref 6.5–8.1)

## 2023-10-13 LAB — URINALYSIS, ROUTINE W REFLEX MICROSCOPIC
Bilirubin Urine: NEGATIVE
Glucose, UA: NEGATIVE mg/dL
Hgb urine dipstick: NEGATIVE
Ketones, ur: NEGATIVE mg/dL
Leukocytes,Ua: NEGATIVE
Nitrite: NEGATIVE
Protein, ur: NEGATIVE mg/dL
Specific Gravity, Urine: 1.021 (ref 1.005–1.030)
pH: 7 (ref 5.0–8.0)

## 2023-10-13 LAB — MAGNESIUM: Magnesium: 2.1 mg/dL (ref 1.7–2.4)

## 2023-10-13 MED ORDER — GLUCERNA SHAKE PO LIQD
237.0000 mL | Freq: Two times a day (BID) | ORAL | Status: DC
  Administered 2023-10-14 – 2023-10-19 (×10): 237 mL via ORAL

## 2023-10-13 MED ORDER — ADULT MULTIVITAMIN W/MINERALS CH
1.0000 | ORAL_TABLET | Freq: Every day | ORAL | Status: DC
  Administered 2023-10-13 – 2023-10-18 (×6): 1
  Filled 2023-10-13 (×5): qty 1

## 2023-10-13 NOTE — Progress Notes (Signed)
 Initial Nutrition Assessment  DOCUMENTATION CODES:   Not applicable  INTERVENTION:   Pre-diabetes diet education provided. Glucerna Shake po BID, each supplement provides 220 kcal and 10 grams of protein. MVI with minerals daily.  NUTRITION DIAGNOSIS:   Increased nutrient needs related to acute illness (rehab therapies) as evidenced by estimated needs.  GOAL:   Patient will meet greater than or equal to 90% of their needs  MONITOR:   PO intake, Supplement acceptance  REASON FOR ASSESSMENT:   Consult Diet education  ASSESSMENT:   73 yo female admitted with functional deficits d/t bilateral frontal and left parietal embolic infarcts. PMH includes osteoarthritis, HLD, hypothyroidism, HTN, asthma, ADHD, diverticulitis, vitamin B-12 deficiency, mild cognitive impairment.  Patient was working with PT outside of room, so RD spoke with patient's husband. RD provided "Carbohydrate Counting for People with Diabetes" and "Plate Method" handouts from the Academy of Nutrition and Dietetics. Discussed different food groups and their effects on blood sugar, emphasizing carbohydrate-containing foods. Provided list of carbohydrates and recommended serving sizes of common foods. Discussed importance of controlled and consistent carbohydrate intake throughout the day. Provided examples of ways to balance meals/snacks and encouraged intake of high-fiber, whole grain complex carbohydrates.   Husband reports that patient likes to eat sweets. She has a good appetite at home. She typically eats 2 meals daily. They drink Premier Protein shakes at home and sometimes this is her breakfast. She also likes to eat apples with caramel dip.   Weight history reviewed. Within the past month, patient has had ~8% weight loss. Suspect she is malnourished. Unable to obtain enough information at this time for identification of malnutrition.    Currently on a carb modified diet. Meal intakes: 75% of breakfast  today  Labs reviewed. A1C 6.2 (09/05/23) CBG: [phone]  Medications reviewed and include vitamin B-12.  NUTRITION - FOCUSED PHYSICAL EXAM:  Unable to complete, patient out of room working with PT  Diet Order:   Diet Order             Diet Carb Modified Fluid consistency: Thin; Room service appropriate? Yes  Diet effective now                   EDUCATION NEEDS:   Education needs have been addressed  Skin:  Skin Assessment: Reviewed RN Assessment  Last BM:  3/25  Height:   Ht Readings from Last 1 Encounters:  10/08/23 4\' 11"  (1.499 m)    Weight:   Wt Readings from Last 1 Encounters:  10/13/23 45.6 kg    Ideal Body Weight:  44.7 kg  BMI:  Body mass index is 20.3 kg/m.  Estimated Nutritional Needs:   Kcal:  1400-1600  Protein:  60-70 gm  Fluid:  1.4-1.6 L   Gabriel Rainwater RD, LDN, CNSC Contact via secure chat. If unavailable, use group chat "RD Inpatient."

## 2023-10-13 NOTE — Progress Notes (Addendum)
 Inpatient Rehabilitation Admission Medication Review by a Pharmacist  A complete drug regimen review was completed for this patient to identify any potential clinically significant medication issues.  High Risk Drug Classes Is patient taking? Indication by Medication  Antipsychotic No   Anticoagulant Yes Enoxaparin- VTE prophylaxis  Antibiotic No   Opioid No   Antiplatelet Yes Aspirin and Plavix for 3 weeks then Aspirin alone-  CVA. DAPT started 10/10/23.  End date /last day to take Plavix is 10/30/2023.  Hypoglycemics/insulin No   Vasoactive Medication Yes Metoprolol XL, Losartan- HTN, acute combined HF  Chemotherapy No   Other Yes Acetaminophen- pain  management  Atorvastatin- HLD Cyanocabolamine- B12 supplement Levothyroxine-hypothyroid Melatonin- sleep Methocarbamol- muscle spasms Miralax, bisacodyl, fleets enema- constipation Ondansetron IV/PO- nausea and vomiting       Type of Medication Issue Identified Description of Issue Recommendation(s)  Drug Interaction(s) (clinically significant)     Duplicate Therapy     Allergy     No Medication Administration End Date  Aspirin and Plavix for 3 weeks then Aspirin alone. DAPT started 10/10/23. End date /last day to take Plavix is 10/30/2023. Will ask Dr. Wynn Banker for entry of stop date of 10/30/23 for plavix.  Communicate to patient /family caregiver prior to discharge amd on AVS.  >> update:placed End date of 4/14 for plavix.  Incorrect Dose     Additional Drug Therapy Needed     Significant med changes from prior encounter (inform family/care partners about these prior to discharge). PTA medications:   Simvastatin 20 mg changed to Atorvastatin 40mg  by Neurologist.  Simvastatin discontinued on [facility] acute discharge orders 10/12/23.   Adderall, metformin  benzonatate, Vitamin D3,  ezetimibe, hydrochlorothiazide, acetylcysteine,   were continued on [facility] acute discharge 10/12/23, but not resumed on CIR admission.  Communicate to patient  /family/ caregiver prior to discharge.      Restart PTA meds when and if necessary during CIR admission or at time of discharge, if warranted   Other       Clinically significant medication issues were identified that warrant physician communication and completion of prescribed/recommended actions by midnight of the next day:  Yes>>>  RESOLVED, see ADDENEDUM below.   Name of provider notified for urgent issues identified: Dr. Wynn Banker  Provider Method of Notification:  secure chat message sent   Pharmacist comments:   Neurologist indicated plan for Aspirin and Plavix for 3 weeks then Aspirin alone. DAPT started 10/10/23.  End date /last day to take Plavix is 10/30/2023. >. RESOLVED, see ADDENEDUM below.   Time spent performing this drug regimen review (minutes):  30   Thank you for allowing pharmacy to be part of this patients care team. Noah Delaine, RPh Clinical Pharmacist 10/13/2023 .10:21 AM  ADDENDUM:    Dr. Wynn Banker approved to place end date for plavix to end after dose on 10/30/2023.   MAR updated.   Noah Delaine, RPh Clinical Pharmacist 10/13/2023 11:04 AM

## 2023-10-13 NOTE — Progress Notes (Signed)
 Admission note  Patient arrived to department via bed from department 3W and escorted to room 4W-2,family members present, placed in bed and made as comfortable as possible, VS obtained, denies pain or discomfort, Staff present ., patient was connected to the Loop recorder , site assess in upper left chest area, dressing to site CDI. Made as comfortable as possible , No acute distress noted.

## 2023-10-13 NOTE — Evaluation (Signed)
 Speech Language Pathology Assessment and Plan  Patient Details  Name: Gabrielle Vargas MRN: [id number] Date of Birth: [birth year]  SLP Diagnosis: Aphasia;Cognitive Impairments;Speech and Language deficits  Rehab Potential: Fair ELOS: 10-12 days    Today's Date: 10/13/2023 SLP Individual Time: [id number]     Hospital Problem: Principal Problem:   CVA (cerebral vascular accident) [facility])  Past Medical History:  Past Medical History:  Diagnosis Date   ADHD (attention deficit hyperactivity disorder) 05/09/2007   Diverticulitis of colon (without mention of hemorrhage) 09/17/2013   Essential hypertension 10/09/2017   History of COVID-19 2021   Hyperlipidemia    Hypothyroidism    Low back pain radiating to right leg 10/09/2017   Mild cognitive impairment of uncertain or unknown etiology 08/04/2022   Mild intermittent asthma with acute exacerbation 05/30/2017   Osteoarthritis    Postmenopausal atrophic vaginitis, severe 08/18/2008   Right knee pain 03/13/2014   S/P arthroscopy of right knee 06/03/2014   Vitamin B12 deficiency    Past Surgical History:  Past Surgical History:  Procedure Laterality Date   ABDOMINAL HYSTERECTOMY     BREAST SURGERY     CARPAL TUNNEL RELEASE Bilateral    CERVICAL DISC ARTHROPLASTY     CESAREAN SECTION     x2   left shoulder surgery     LOOP RECORDER INSERTION N/A 10/12/2023   Procedure: LOOP RECORDER INSERTION;  Surgeon: Lanier Prude, MD;  Location: [facility];  Service: Cardiovascular;  Laterality: N/A;   MENISCUS REPAIR Right 2017   REDUCTION MAMMAPLASTY Bilateral    right shoulder     TONSILLECTOMY      Assessment / Plan / Recommendation Clinical Impression   [name] is a 73 year old female who presented to [facility] ED on 10/08/2023 with sudden onset of left facial droop and left-sided weakness. Code stroke activated. 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 No significant proximal stenosis, aneurysm, or branch vessel occlusion within the Circle of Willis. Minimal atherosclerotic change at the proximal left ICA without significant stenosis. Aortic atherosclerosis. Incidentally imaged spine findings on CTA: Cervical fusion at C4-5 and C5-6. Slight degenerative anterolisthesis at C2-3 and C3-4.  EKG: Sinus rhythm; Atrial premature complex; Nonspecific T abnormalities, lateral leads. Labs: Na 133, K 2.8, glucose 110, ionized calcium 1.08, LFTs normal, BUN and Cr normal, CBC normal, U/A normal, UDS positive for amphetamine (on Adderall at home).   Received TNK at ~1800 hours. Neurology consulted and started on aspirin and Plavix. MRI: Multiple punctate acute infarcts bilateral frontal and left parietal lobes.   2D Echo EF 30 to 35%.  LV with grade 1 diastolic dysfunction. LDL 80. Hgb A1c 6.2%. Cardiology consulted. Per cardiology assessment: Acute combined heart failure: Echocardiogram shows LVEF 30 to 35% with regional wall motion abnormalities.  Could represent CAD versus Takotsubo's variant as presented with acute CVA. Discussed with Dr. Pearlean Brownie, she is outside the window for permissive hypertension and okay to start GDMT.  Started Toprol-XL 25 mg daily.  BP soft, no room to add Entresto.  Will add low-dose losartan 12.5 mg daily.  Can add SGLT2 and spironolactone as outpatient. Recommend repeat echocardiogram as outpatient in next 3 months.  If EF remains reduced, would plan ischemic evaluation. Currently euvolemic, no diuresis recommended. Now on aspirin 81 mg and Plavix 75 mg for 3 weeks, then aspirin alone.  Discussed with cardiology no need for anticoagulation at this time.  Loop recorder to be placed today, 3/27   EEG suggestive of moderate diffuse encephalopathy. No seizures or epileptiform discharges were seen throughout the recording. Tolerating regular diet with thin liquids. Overall she requires mod A for UB and LB ADL, min A for  transfers and +2 safety for mobility with HHA. 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    She is typically independent in ADL and mobility. Drives, retired [employer] operational officer. Enjoys walking 4-6 miles daily with husband.    Skilled Therapeutic Interventions          Pt presents with nonfluent expressive and receptive language deficits consistent with global aphasia per informal speech-language assessment conducted today with portions of the Bedside WAB. Pt performance on assessment today did not align with family description of her communication or SLP cognitive-linguistic assessment in acute care on 10/13/23.  It has been noted that pt's cognition and communication can fluctuate with fatigue; however, significant deficits were observed with simple object naming, following simple directions, and repetition beyond the word level today. Results outlined below:   -Orientation: Oriented to self and DOB (though not year) only. Disoriented to place, situation, and date.  -Spontaneous speech content: No initiation observed. Pt unable to tell me her address. Significant hesitation and inability to describe picture.  -Fluency: 2- single words, effortful and hesitant -Y/N questions: ~50% for simple and abstract. Primarily answered "yes" so accuracy likely less than 50% -Commands: able to identify objets f=3, <25% for simple commands -Repetition: word level only; delayed response time -object naming: ~50% with delayed response time  Of note, pt scored 19/30 on SLUMS assessment with SLP yesterday. Though this score highlights cognitive deficits, it demonstrates better performance that results of today's language assessment.   Speech: assessment limited due to minimal verbalizations, though pt's responses did appear clear and intelligible without concern for dysarthria. Plan for  ongoing assessment.   Pt left in room with family members and nursing present to assist her to the bathroom. Continue SLP PoC.     SLP Assessment  Patient will need skilled Speech Lanaguage Pathology Services during CIR admission    Recommendations  Patient destination: Home Follow up Recommendations: 24 hour supervision/assistance;Home Health SLP Equipment Recommended: None recommended by SLP    SLP Frequency 3 to 5 out of 7 days   SLP Duration  SLP Intensity  SLP Treatment/Interventions 10-12 days  Minumum of 1-2 x/day, 30 to 90 minutes  Speech/Language facilitation;Cueing hierarchy;Functional tasks;Patient/family education;Therapeutic Exercise;Therapeutic Activities    Pain Pain Assessment Pain Scale: 0-10 Pain Score: 0-No pain  Prior Functioning Cognitive/Linguistic Baseline: Baseline deficits Baseline deficit details: Memory recall per report Type of Home: House  Lives With: Spouse Available Help at Discharge: Available 24 hours/day;Family Vocation: Retired  SLP Evaluation Cognition Overall Cognitive Status: Impaired/Different from baseline Arousal/Alertness: Awake/alert Orientation Level: Oriented to person Month: March Day of Week: Incorrect Attention: Sustained Sustained Attention: Impaired Sustained Attention Impairment: Verbal basic;Functional basic Memory: Impaired Memory Impairment: Retrieval deficit;Decreased recall of new information;Decreased short term memory Decreased Short Term Memory: Verbal basic;Functional basic Awareness: Appears intact Problem Solving: Appears intact Organizing: Impaired Behaviors: Perseveration Safety/Judgment: Appears intact  Comprehension Auditory Comprehension Overall Auditory Comprehension: Impaired Yes/No Questions: Impaired Basic Immediate Environment Questions: 50-74% accurate Commands: Impaired Two Step Basic Commands: 50-74% accurate Conversation: Simple Interfering Components: Attention;Working  memory EffectiveTechniques: Repetition;Pausing;Stressing words;Extra processing time Visual Recognition/Discrimination Discrimination: Within Function Limits Reading Comprehension Reading Status: Not tested  Expression Expression Primary Mode of Expression: Verbal Verbal Expression Overall Verbal Expression: Impaired Initiation: Impaired Level of Generative/Spontaneous Verbalization: Word Repetition: Impaired Level of Impairment: Phrase level;Sentence level Naming: Impairment Responsive: 51-75% accurate Verbal Errors: Perseveration;Not aware of errors Pragmatics: Impairment Impairments: Abnormal affect;Eye contact Interfering Components: Attention;Premorbid deficit Effective Techniques: Phonemic cues;Sentence completion Non-Verbal Means of Communication: Not applicable Written Expression Dominant Hand: Right Oral Motor Oral Motor/Sensory Function Overall Oral Motor/Sensory Function: Within functional limits Motor Speech Overall Motor Speech: Appears within functional limits for tasks assessed Respiration: Within functional limits Phonation: Normal Resonance: Within functional limits Articulation: Within functional limitis Intelligibility: Intelligible Motor Planning: Witnin functional limits Motor Speech Errors: Not applicable Interfering Components: Premorbid status  Care Tool Care Tool Cognition Ability to hear (with hearing aid or hearing appliances if normally used Ability to hear (with hearing aid or hearing appliances if normally used): 0. Adequate - no difficulty in normal conservation, social interaction, listening to TV   Expression of Ideas and Wants Expression of Ideas and Wants: 2. Frequent difficulty - frequently exhibits difficulty with expressing needs and ideas   Understanding Verbal and Non-Verbal Content Understanding Verbal and Non-Verbal Content: 2. Sometimes understands - understands only basic conversations or simple, direct phrases. Frequently  requires cues to understand  Memory/Recall Ability Memory/Recall Ability : That he or she is in a hospital/hospital unit   PMSV Assessment  PMSV Trial Intelligibility: Intelligible  Bedside Swallowing Assessment General    Oral Care Assessment Oral Assessment  (WDL): Within Defined Limits Lips: Symmetrical Teeth: Intact Tongue: Pink;Moist Mucous Membrane(s): Moist;Pink Level of Consciousness: Alert Is patient on any of following O2 devices?: None of the above Nutritional status: No high risk factors Oral Assessment Risk : Low Risk   Short Term Goals: Week 1: SLP Short Term Goal 1 (Week 1): Pt will name common objects and their functions with >75% accuracy given min cues. SLP Short Term Goal 2 (Week 1): Pt will follow 1-2 step directions with >75% accuracy given min cues. SLP Short Term Goal 3 (Week 1): Pt will produce sponatenous verbalizations at the phrase and short sentence level in more than 50% of opportunities given min cues. SLP Short Term Goal 4 (Week 1): Pt will provide orientation information with >75% accuracy given min cues and supportive materials (calendar, etc). SLP Short Term Goal 5 (Week 1): Pt will maintain attention to structured tasks for >3 minutes given min cues.  Refer to Care Plan for Long Term Goals  Recommendations for other services: None   Discharge Criteria: Patient will be discharged from SLP if patient refuses treatment 3 consecutive times without medical reason, if treatment goals not met, if there is a change in medical status, if patient makes no progress towards goals or if patient is discharged from hospital.  The above assessment, treatment plan, treatment alternatives and goals were discussed and mutually agreed upon: by patient and by family  Ellery Plunk 10/13/2023, 11:57 AM

## 2023-10-13 NOTE — H&P (Signed)
 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 Code stroke activated. 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 No significant proximal stenosis, aneurysm, or branch vessel occlusion within the Circle of Willis. Minimal atherosclerotic change at the proximal left ICA without significant stenosis. Aortic atherosclerosis. Incidentally imaged spine findings on CTA: Cervical fusion at C4-5 and C5-6. Slight degenerative anterolisthesis at C2-3 and C3-4.  EKG: Sinus rhythm; Atrial premature complex; Nonspecific T abnormalities, lateral leads. Labs: Na 133, K 2.8, glucose 110, ionized calcium 1.08, LFTs normal, BUN and Cr normal, CBC normal, U/A normal, UDS positive for amphetamine (on Adderall at home).   Received TNK at ~1800 hours. Neurology consulted and started on aspirin and Plavix. MRI: Multiple punctate acute infarcts bilateral frontal and left parietal lobes.   2D Echo EF 30 to 35%.  LV with grade 1 diastolic dysfunction. LDL 80. Hgb A1c 6.2%. Cardiology consulted. Per cardiology assessment: Acute combined heart failure: Echocardiogram shows LVEF 30 to 35% with regional wall motion abnormalities.  Could represent CAD versus Takotsubo's variant as presented with acute CVA. Discussed with Dr. Pearlean Brownie, she is outside the window for permissive hypertension and okay to start GDMT.  Started Toprol-XL 25 mg daily.  BP soft, no room to add Entresto.  Will add low-dose losartan 12.5 mg daily.  Can add SGLT2 and spironolactone as outpatient. Recommend repeat echocardiogram as outpatient in next 3 months.  If EF remains reduced, would plan ischemic evaluation. Currently euvolemic, no  diuresis recommended. Now on aspirin 81 mg and Plavix 75 mg for 3 weeks, then aspirin alone.  Discussed with cardiology no need for anticoagulation at this time. Loop recorder to be placed today, 3/27   EEG suggestive of moderate diffuse encephalopathy. No seizures or epileptiform discharges were seen throughout the recording. Tolerating regular diet with thin liquids. Overall she requires mod A for UB and LB ADL, min A for transfers and +2 safety for mobility with HHA. 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    She is typically independent in ADL and mobility. Drives, retired Armed forces operational officer. Enjoys walking 4-6 miles daily with husband.          Review of Systems  Constitutional:  Negative for chills and fever.  HENT:  Negative for sore throat.        Reports chronic nasal drip  Respiratory:  Positive for cough. Negative for shortness of breath.   Cardiovascular:  Negative for chest pain and palpitations.  Gastrointestinal:  Positive for constipation. Negative for abdominal pain, nausea and vomiting.  Genitourinary:  Negative for dysuria and urgency.  Musculoskeletal:  Negative for back pain.       Chronic neck pain and decreased range of motion  Neurological:  Negative for dizziness and headaches.  Psychiatric/Behavioral:  Negative for depression. The patient is not nervous/anxious.         Past Medical History:  Diagnosis Date   ADHD (attention deficit hyperactivity disorder) 05/09/2007   Diverticulitis of colon (without mention of hemorrhage) 09/17/2013   Essential hypertension 10/09/2017   History of COVID-19 2021   Hyperlipidemia  Hypothyroidism     Low back pain radiating to right leg 10/09/2017   Mild cognitive impairment of uncertain or unknown etiology 08/04/2022   Mild intermittent asthma with acute exacerbation 05/30/2017    Osteoarthritis     Postmenopausal atrophic vaginitis, severe 08/18/2008   Right knee pain 03/13/2014   S/P arthroscopy of right knee 06/03/2014   Vitamin B12 deficiency               Past Surgical History:  Procedure Laterality Date   ABDOMINAL HYSTERECTOMY       BREAST SURGERY       CARPAL TUNNEL RELEASE Bilateral     CERVICAL DISC ARTHROPLASTY       CESAREAN SECTION        x2   left shoulder surgery       MENISCUS REPAIR Right 2017   REDUCTION MAMMAPLASTY Bilateral     right shoulder       TONSILLECTOMY                 Family History  Problem Relation Age of Onset   Melanoma Mother     Kidney disease Father     Diabetes Father     Heart disease Father     Breast cancer Paternal Grandmother     ADD / ADHD Other          multiple family members   Colon cancer Neg Hx     Esophageal cancer Neg Hx     Rectal cancer Neg Hx     Stomach cancer Neg Hx          Social History:  reports that she has never smoked. She has never used smokeless tobacco. She reports that she does not drink alcohol and does not use drugs. Allergies:  Allergies      Allergies  Allergen Reactions   Oxycodone-Acetaminophen Nausea Only   Latex Hives and Rash            Medications Prior to Admission  Medication Sig Dispense Refill   Acetylcysteine (NAC) 500 MG CAPS Take 500 mg by mouth daily.       amphetamine-dextroamphetamine (ADDERALL) 20 MG tablet Take 1 tablet (20 mg total) by mouth 2 (two) times daily. (Patient taking differently: Take 20 mg by mouth daily.) 60 tablet 0   Ascorbic Acid (VITAMIN C) 1000 MG tablet Take 1,000 mg by mouth daily.       benzonatate (TESSALON) 200 MG capsule Take 1 capsule (200 mg total) by mouth 3 (three) times daily as needed. 30 capsule 1   Bioflavonoid Products (QUERCETIN COMPLEX IMMUNE PO) Take 500 mg by mouth daily.       Cholecalciferol (D3-50 PO) Take 1 capsule by mouth daily.       Evening Primrose Oil 1000 MG CAPS Take 1,000 mg by mouth in the  morning and at bedtime.       ezetimibe (ZETIA) 10 MG tablet TAKE 1 TABLET BY MOUTH DAILY (Patient taking differently: Take 10 mg by mouth at bedtime.) 90 tablet 3   hydrochlorothiazide (HYDRODIURIL) 25 MG tablet TAKE 1 TABLET BY MOUTH DAILY 90 tablet 1   metFORMIN (GLUCOPHAGE) 500 MG tablet TAKE 2 TABLETS(1000 MG) BY MOUTH TWICE DAILY WITH A MEAL 360 tablet 1   simvastatin (ZOCOR) 20 MG tablet TAKE 1 TABLET(20 MG) BY MOUTH EVERY OTHER DAY 45 tablet 3   SYNTHROID 75 MCG tablet TAKE 1 TABLET BY MOUTH DAILY 90 tablet 3   vitamin B-12 (CYANOCOBALAMIN)  1000 MCG tablet Take 1,000 mcg by mouth 2 (two) times a week.       Zinc 20 MG CAPS Take 20 mg by mouth daily.                  Home: Home Living Family/patient expects to be discharged to:: Private residence Living Arrangements: Spouse/significant other Available Help at Discharge: Family, Available 24 hours/day Type of Home: House Home Access: Stairs to enter Secretary/administrator of Steps: 2 Entrance Stairs-Rails: None Home Layout: Multi-level, Able to live on main level with bedroom/bathroom Bathroom Shower/Tub: Health visitor: Standard Bathroom Accessibility: Yes Home Equipment: None  Lives With: Spouse   Functional History: Prior Function Prior Level of Function : Independent/Modified Independent, Driving Mobility Comments: no DME, walks 4-6 miles daily ADLs Comments: independent, does IADL   Functional Status:  Mobility: Bed Mobility Overal bed mobility: Needs Assistance Bed Mobility: Supine to Sit Supine to sit: Mod assist, +2 for physical assistance General bed mobility comments: ModA +2 to transition hips and  legs EOB. Patient inconsistent with command following Transfers Overall transfer level: Needs assistance Equipment used: 2 person hand held assist Transfers: Sit to/from Stand Sit to Stand: Contact guard assist Bed to/from chair/wheelchair/BSC transfer type:: Step pivot Step pivot transfers:  Min assist, +2 safety/equipment General transfer comment: CGA+2 to ensure safety with sit > stand. Patient uses LE support to power up from a seated position Ambulation/Gait Ambulation/Gait assistance: Contact guard assist, +2 safety/equipment Gait Distance (Feet): 150 Feet Assistive device: 2 person hand held assist Gait Pattern/deviations: Decreased stride length General Gait Details: CGA +2; Cues to increase cadence, slow processing when dual-tasking Gait velocity: Decreased   ADL: ADL Overall ADL's : Needs assistance/impaired Eating/Feeding: NPO Grooming: Minimal assistance, Wash/dry face, Cueing for sequencing, Cueing for compensatory techniques, Sitting Grooming Details (indicate cue type and reason): EOB, Pt able to bring hand to face for tasks, attempted to put on lip balm with lid on. Could not problem solve despite multimodal max cues - required physical assist Upper Body Bathing: Moderate assistance, Sitting Lower Body Bathing: Moderate assistance, Sitting/lateral leans Upper Body Dressing : Moderate assistance, Sitting Upper Body Dressing Details (indicate cue type and reason): to don additional gown like robe Lower Body Dressing: Maximal assistance, Sitting/lateral leans Lower Body Dressing Details (indicate cue type and reason): Pt unable to doff socks. When provided sock, Pt able to put on her R foot, but could not put it on L. Pt is flexible and able to perform figure 4 Toilet Transfer: Minimal assistance, Cueing for safety, Ambulation Toilet Transfer Details (indicate cue type and reason): 1 person HHA Toileting- Clothing Manipulation and Hygiene: Maximal assistance, Sit to/from stand Toileting - Clothing Manipulation Details (indicate cue type and reason): Pt wet and unaware Functional mobility during ADLs: Minimal assistance, Cueing for safety, Cueing for sequencing (1 person hand held assist)   Cognition: Cognition Orientation Level: Oriented to person, Oriented to  place, Oriented to time, Oriented to situation Cognition Arousal: Alert Behavior During Therapy: Flat affect   Physical Exam: Blood pressure 102/69, pulse (!) 58, temperature 98 F (36.7 C), temperature source Oral, resp. rate 16, height 4\' 11"  (1.499 m), weight 49.6 kg, SpO2 94%. Physical Exam Constitutional: No apparent distress. Appropriate appearance for age.  HENT: No JVD. Neck Supple. Trachea midline. Atraumatic, normocephalic. Eyes: PERRLA. EOMI. + ?L field cut   Cardiovascular: RRR, no murmurs/rub/gallops. No Edema. Peripheral pulses 2+  Respiratory: CTAB. No rales, rhonchi, or wheezing. On RA.  Abdomen: +  bowel sounds, normoactive. No distention or tenderness.  Skin: C/D/I. No apparent lesions. PIV intact  MSK:      No apparent deformity.       Neurologic exam:  Cognition: AAO to person, place, time and event.  Language: Fluent, No substitutions or neoglisms. No dysarthria. Names 3/3 objects correctly.  Memory: Recalls 0/3 objects at 5 minutes.   + Difficulty with attention - cannot add change or spell WORLD backwards  Insight: Poor insight into current condition.  Mood: Pleasant affect, appropriate mood.  Sensation: To light touch intact in BL UEs and LEs  Reflexes: 2+ in BL UE and LEs. -hoffman's, babinski CN: Subtle L facial droop Coordination: No apparent tremors. Mild RUE ataxia Spasticity: MAS 0 in all extremities.       Strength:                RUE: 5/5 SA, 5/5 EF, 5/5 EE, 5/5 WE, 5/5 FF, 5/5 FA                LUE:  5-/5 SA, 5-/5 EF, 5-/5 EE, 5/5 WE, 5/5 FF, 5/5 FA                RLE: 5/5 HF, 5/5 KE, 5/5  DF, 5/5  EHL, 5/5  PF                 LLE:  5/5 HF, 5/5 KE, 5/5  DF, 5/5  EHL, 5/5  PF           Lab Results Last 48 Hours        Results for orders placed or performed during the hospital encounter of 10/08/23 (from the past 48 hours)  Glucose, capillary     Status: None    Collection Time: 10/10/23 11:41 AM  Result Value Ref Range     Glucose-Capillary 96 70 - 99 mg/dL      Comment: Glucose reference range applies only to samples taken after fasting for at least 8 hours.  Glucose, capillary     Status: None    Collection Time: 10/10/23  4:20 PM  Result Value Ref Range    Glucose-Capillary 74 70 - 99 mg/dL      Comment: Glucose reference range applies only to samples taken after fasting for at least 8 hours.    Comment 1 Notify RN      Comment 2 Document in Chart    Glucose, capillary     Status: Abnormal    Collection Time: 10/10/23  9:11 PM  Result Value Ref Range    Glucose-Capillary 136 (H) 70 - 99 mg/dL      Comment: Glucose reference range applies only to samples taken after fasting for at least 8 hours.    Comment 1 Notify RN      Comment 2 Document in Chart    Glucose, capillary     Status: Abnormal    Collection Time: 10/11/23  6:31 AM  Result Value Ref Range    Glucose-Capillary 109 (H) 70 - 99 mg/dL      Comment: Glucose reference range applies only to samples taken after fasting for at least 8 hours.    Comment 1 Notify RN      Comment 2 Document in Chart    Magnesium     Status: None    Collection Time: 10/11/23  9:11 AM  Result Value Ref Range    Magnesium 2.1 1.7 - 2.4 mg/dL      Comment:  Performed at [facility], [address], [place], [place] [address]  Glucose, capillary     Status: Abnormal    Collection Time: 10/11/23 11:32 AM  Result Value Ref Range    Glucose-Capillary 165 (H) 70 - 99 mg/dL      Comment: Glucose reference range applies only to samples taken after fasting for at least 8 hours.    Comment 1 Notify RN      Comment 2 Document in Chart    Glucose, capillary     Status: Abnormal    Collection Time: 10/11/23  4:13 PM  Result Value Ref Range    Glucose-Capillary 133 (H) 70 - 99 mg/dL      Comment: Glucose reference range applies only to samples taken after fasting for at least 8 hours.    Comment 1 Notify RN      Comment 2 Document in Chart    Glucose, capillary      Status: Abnormal    Collection Time: 10/11/23  9:16 PM  Result Value Ref Range    Glucose-Capillary 153 (H) 70 - 99 mg/dL      Comment: Glucose reference range applies only to samples taken after fasting for at least 8 hours.    Comment 1 Notify RN    Glucose, capillary     Status: None    Collection Time: 10/12/23  6:23 AM  Result Value Ref Range    Glucose-Capillary 96 70 - 99 mg/dL      Comment: Glucose reference range applies only to samples taken after fasting for at least 8 hours.  Basic metabolic panel     Status: Abnormal    Collection Time: 10/12/23  6:28 AM  Result Value Ref Range    Sodium 135 135 - 145 mmol/L    Potassium 3.4 (L) 3.5 - 5.1 mmol/L    Chloride 101 98 - 111 mmol/L    CO2 23 22 - 32 mmol/L    Glucose, Bld 103 (H) 70 - 99 mg/dL      Comment: Glucose reference range applies only to samples taken after fasting for at least 8 hours.    BUN 13 8 - 23 mg/dL    Creatinine, Ser 6.29 0.44 - 1.00 mg/dL    Calcium 9.2 8.9 - 52.8 mg/dL    GFR, Estimated >41 >32 mL/min      Comment: (NOTE) Calculated using the CKD-EPI Creatinine Equation (2021)      Anion gap 11 5 - 15      Comment: Performed at [facility], [address], [place], [place] [address]      Imaging Results (Last 48 hours)  No results found.         Blood pressure 102/69, pulse (!) 58, temperature 98 F (36.7 C), temperature source Oral, resp. rate 16, height 4\' 11"  (1.499 m), weight 49.6 kg, SpO2 94%.   Medical Problem List and Plan: 1. 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  Antithrombotics: -DVT/anticoagulation:  Pharmaceutical: Lovenox             -antiplatelet therapy: Aspirin and Plavix for three weeks followed by aspirin alone   3. Pain Management: Tylenol as needed  4. Mood/Behavior/Sleep: LCSW to  evaluate and provide emotional support             -antipsychotic agents: n/a   5. Neuropsych/cognition: This patient is not capable of making decisions on her own behalf.    -  Notable Hx ADHD and mild cognitive impairment - per family deficits are currently worse than baseline  6. Skin/Wound Care: Routine skin care checks   7. 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  BP soft, no room to add Entresto. Add low-dose losartan 12.5 mg daily.  Can add SGLT2 and spironolactone as outpatient              -daily weight             -maintain K>4, mag>2              -follow-up [facility] as outpt   12: DM: on metformin 1000 mg BID at home>>not restarted; A1c = 6.2%             -monitor CBGs BID             -not requiring insulin coverage>>discontinue SSI   13: History of mild cognitive impairment   14: Hypokalemia: given supplement             -follow-up BMP   15: Mild thrombocytopenia: follow-up CBC   16: Constipation: prefers prunes/fruits/veg    Milinda Antis, PA-C 10/12/2023  I have examined the patient independently and edited the note for HPI, ROS, exam, assessment, and plan as appropriate. I am in agreement with the above recommendations.   [name], DO 10/12/2023

## 2023-10-13 NOTE — Progress Notes (Signed)
 Patient ID: Gabrielle Vargas, female   DOB: October 06, 1950, 73 y.o.   MRN: [id number] Met with the patient, spouse and daughter to review current medical situation, rehab process, team conference and plan of care. Discussed secondary risk  management including  DM (A1C 6.2), HLD (LDL 110/Trig 87) on  Lipitor, HTN, HF with CAD, and new loop recorder implant/monitor at bedside. Discussed DAPT x 3 weeks then ASA solo.  Patient with urinary urgency/little output, continent of bowel w constipation. Able to eliminate bowels but unable to void for UA+C sample.  Reviewed CMM diet and daily weight recommendations. Family asking about CMM dietary modifications; dietician consult for more information related to refined sugar/starch intake and whole food options. Continue to follow along to address educational needs to facilitate preparation for discharge. Pamelia Hoit

## 2023-10-13 NOTE — Progress Notes (Signed)
 Inpatient Rehabilitation Center Individual Statement of Services  Patient Name:  Gabrielle Vargas  Date:  10/13/2023  Welcome to the Inpatient Rehabilitation Center.  Our goal is to provide you with an individualized program based on your diagnosis and situation, designed to meet your specific needs.  With this comprehensive rehabilitation program, you will be expected to participate in at least 3 hours of rehabilitation therapies Monday-Friday, with modified therapy programming on the weekends.  Your rehabilitation program will include the following services:  Physical Therapy (PT), Occupational Therapy (OT), Speech Therapy (ST), 24 hour per day rehabilitation nursing, Therapeutic Recreaction (TR), Neuropsychology, Care Coordinator, Rehabilitation Medicine, Nutrition Services, and Pharmacy Services  Weekly team conferences will be held on Wednesday to discuss your progress.  Your Inpatient Rehabilitation Care Coordinator will talk with you frequently to get your input and to update you on team discussions.  Team conferences with you and your family in attendance may also be held.  Expected length of stay: 7-10 days  Overall anticipated outcome: supervision with cues  Depending on your progress and recovery, your program may change. Your Inpatient Rehabilitation Care Coordinator will coordinate services and will keep you informed of any changes. Your Inpatient Rehabilitation Care Coordinator's name and contact numbers are listed  below.  The following services may also be recommended but are not provided by the Inpatient Rehabilitation Center:  Driving Evaluations Home Health Rehabiltiation Services Outpatient Rehabilitation Services    Arrangements will be made to provide these services after discharge if needed.  Arrangements include referral to agencies that provide these services.  Your insurance has been verified to be:  [employer] Your primary doctor is:  Engineer, materials  Pertinent information will be shared with your doctor and your insurance company.  Inpatient Rehabilitation Care Coordinator:  Dossie Der, [place] [phone] or Luna Glasgow  Information discussed with and copy given to patient by: Lucy Chris, 10/13/2023, 9:19 AM

## 2023-10-13 NOTE — Progress Notes (Signed)
 Inpatient Rehabilitation Care Coordinator Assessment and Plan Patient Details  Name: Gabrielle Vargas MRN: [id number] Date of Birth: 1951/06/13  Today's Date: 10/13/2023  Hospital Problems: Principal Problem:   CVA (cerebral vascular accident) [facility])  Past Medical History:  Past Medical History:  Diagnosis Date   ADHD (attention deficit hyperactivity disorder) 05/09/2007   Diverticulitis of colon (without mention of hemorrhage) 09/17/2013   Essential hypertension 10/09/2017   History of COVID-19 2021   Hyperlipidemia    Hypothyroidism    Low back pain radiating to right leg 10/09/2017   Mild cognitive impairment of uncertain or unknown etiology 08/04/2022   Mild intermittent asthma with acute exacerbation 05/30/2017   Osteoarthritis    Postmenopausal atrophic vaginitis, severe 08/18/2008   Right knee pain 03/13/2014   S/P arthroscopy of right knee 06/03/2014   Vitamin B12 deficiency    Past Surgical History:  Past Surgical History:  Procedure Laterality Date   ABDOMINAL HYSTERECTOMY     BREAST SURGERY     CARPAL TUNNEL RELEASE Bilateral    CERVICAL DISC ARTHROPLASTY     CESAREAN SECTION     x2   left shoulder surgery     LOOP RECORDER INSERTION N/A 10/12/2023   Procedure: LOOP RECORDER INSERTION;  Surgeon: Lanier Prude, MD;  Location: [facility] INVASIVE CV LAB;  Service: Cardiovascular;  Laterality: N/A;   MENISCUS REPAIR Right 2017   REDUCTION MAMMAPLASTY Bilateral    right shoulder     TONSILLECTOMY     Social History:  reports that she has never smoked. She has never used smokeless tobacco. She reports that she does not drink alcohol and does not use drugs.  Family / Support Systems Marital Status: Married Patient Roles: Spouse, Parent, Other (Comment) (retiree) Spouse/Significant Other: Greggory Stallion [phone] Children: Two children who are local-daughter and a son boith are involved Other Supports: Friends and neighbors Anticipated Caregiver:  Husband Ability/Limitations of Caregiver: no limitations in good health and here daily Caregiver Availability: 24/7 Family Dynamics: Close knit with family and neighbors. Both are very acitve and walk daily for 4-6 miles. Pt hopes to be mod/i level by discharge not used to relying on others  Social History Preferred language: English Religion: Non-Denominational Cultural Background: NA Education: IT trainer - How often do you need to have someone help you when you read instructions, pamphlets, or other written material from your doctor or pharmacy?: Never Writes: Yes Employment Status: Retired Marine scientist Issues: NA Guardian/Conservator: NA according to MD pt is not fully capable of making her own decisions while here. Will look toward her husband for any decisions while here   Abuse/Neglect Abuse/Neglect Assessment Can Be Completed: Yes Physical Abuse: Denies Verbal Abuse: Denies Sexual Abuse: Denies Exploitation of patient/patient's resources: Denies Self-Neglect: Denies  Patient response to: Social Isolation - How often do you feel lonely or isolated from those around you?: Never  Emotional Status Pt's affect, behavior and adjustment status: Pt has been very active and independent, both she and husband are very active. No ones to sit still. At times pt has issues with her knee and may ask about an injection since it worked for 2 years last time. Will eventually need TKR Recent Psychosocial Issues: other health issues-prior back surgery Psychiatric History: No history-issues may benefit from seeing neuro-psych once her speech improves. Substance Abuse History: NA  Patient / Family Perceptions, Expectations & Goals Pt/Family understanding of illness & functional limitations: Pt and husband can explain her stroke and deficits, both were surprised  that this happened and want to prevent another one. Both talk with the MD's involved and know her  plan moving forward. Pt looks toward her husband due to her speech deficits. Premorbid pt/family roles/activities: wife, mom, retiree, neighbor, etc Anticipated changes in roles/activities/participation: resume Pt/family expectations/goals: Pt states: " I want to do well."  Husband states: " We hope she improves and does well here."  [employer]: None Premorbid Home Care/DME Agencies: None Transportation available at discharge: both Is the patient able to respond to transportation needs?: Yes In the past 12 months, has lack of transportation kept you from medical appointments or from getting medications?: No In the past 12 months, has lack of transportation kept you from meetings, work, or from getting things needed for daily living?: No Resource referrals recommended: Neuropsychology  Discharge Planning Living Arrangements: Spouse/significant other Support Systems: Spouse/significant other, Children, Manufacturing engineer, Psychologist, clinical community Type of Residence: Private residence Community education officer Resources: Media planner (specify) ([employer]) Financial Resources: Social Security, Family Support Financial Screen Referred: No Living Expenses: Own Money Management: Patient, Spouse Does the patient have any problems obtaining your medications?: No Home Management: both Patient/Family Preliminary Plans: Return home with husband who is in good health and can assist if needed. Both are hopeful she will do well here and make good progress. Aware being evaluated today and goals being set for her stay here. Care Coordinator Anticipated Follow Up Needs: HH/OP  Clinical Impression Pleasant couple who are very active walking 4-6 miles daily. Both hopeful pt will do well and make good progress while here. Will await team's evaluations and work on discharge needs.  Lucy Chris 10/13/2023, 9:17 AM

## 2023-10-13 NOTE — Progress Notes (Deleted)
 Nutrition Education Note  RD consulted for nutrition education regarding pre-diabetes.   Lab Results  Component Value Date   HGBA1C 6.2 09/05/2023    Patient was working with PT outside of room, so RD spoke with patient's husband.  RD provided "Carbohydrate Counting for People with Diabetes" and "Plate Method" handouts from the Academy of Nutrition and Dietetics. Discussed different food groups and their effects on blood sugar, emphasizing carbohydrate-containing foods. Provided list of carbohydrates and recommended serving sizes of common foods.  Discussed importance of controlled and consistent carbohydrate intake throughout the day. Provided examples of ways to balance meals/snacks and encouraged intake of high-fiber, whole grain complex carbohydrates.   Body mass index is 20.3 kg/m. Pt meets criteria for normal weight based on current BMI.  Current diet order is Carbohydrate modified, patient is consuming approximately 75% of meals at this time. Labs and medications reviewed. No further nutrition interventions warranted at this time. RD contact information provided. If additional nutrition issues arise, please re-consult RD.   Gabriel Rainwater RD, LDN, CNSC Contact via secure chat. If unavailable, use group chat "RD Inpatient."

## 2023-10-13 NOTE — Evaluation (Signed)
 Physical Therapy Assessment and Plan  Patient Details  Name: Gabrielle Vargas MRN: [id number] Date of Birth: 31-Dec-1950  PT Diagnosis: Difficulty walking, Hemiparesis non-dominant, and Muscle weakness Rehab Potential: Good ELOS: 7 days   Today's Date: 10/13/2023 PT Individual Time: 1324-4010 PT Individual Time Calculation (min): 69 min    Hospital Problem: Principal Problem:   CVA (cerebral vascular accident) [facility])   Past Medical History:  Past Medical History:  Diagnosis Date   ADHD (attention deficit hyperactivity disorder) 05/09/2007   Diverticulitis of colon (without mention of hemorrhage) 09/17/2013   Essential hypertension 10/09/2017   History of COVID-19 2021   Hyperlipidemia    Hypothyroidism    Low back pain radiating to right leg 10/09/2017   Mild cognitive impairment of uncertain or unknown etiology 08/04/2022   Mild intermittent asthma with acute exacerbation 05/30/2017   Osteoarthritis    Postmenopausal atrophic vaginitis, severe 08/18/2008   Right knee pain 03/13/2014   S/P arthroscopy of right knee 06/03/2014   Vitamin B12 deficiency    Past Surgical History:  Past Surgical History:  Procedure Laterality Date   ABDOMINAL HYSTERECTOMY     BREAST SURGERY     CARPAL TUNNEL RELEASE Bilateral    CERVICAL DISC ARTHROPLASTY     CESAREAN SECTION     x2   left shoulder surgery     LOOP RECORDER INSERTION N/A 10/12/2023   Procedure: LOOP RECORDER INSERTION;  Surgeon: Lanier Prude, MD;  Location: [facility] INVASIVE CV LAB;  Service: Cardiovascular;  Laterality: N/A;   MENISCUS REPAIR Right 2017   REDUCTION MAMMAPLASTY Bilateral    right shoulder     TONSILLECTOMY      Assessment & Plan Clinical Impression: Gabrielle Vargas is a 73 year old female who [facility] ED on 10/08/2023 with sudden onset of left facial droop and left-sided weakness. Code stroke activated. 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 No significant proximal stenosis, aneurysm, or branch vessel occlusion within the Circle of Willis. Minimal atherosclerotic change at the proximal left ICA without significant stenosis. Aortic atherosclerosis. Incidentally imaged spine findings on CTA: Cervical fusion at C4-5 and C5-6. Slight degenerative anterolisthesis at C2-3 and C3-4.  EKG: Sinus rhythm; Atrial premature complex; Nonspecific T abnormalities, lateral leads. Labs: Na 133, K 2.8, glucose 110, ionized calcium 1.08, LFTs normal, BUN and Cr normal, CBC normal, U/A normal, UDS positive for amphetamine (on Adderall at home).   Received TNK at ~1800 hours. Neurology consulted and started on aspirin and Plavix. MRI: Multiple punctate acute infarcts bilateral frontal and left parietal lobes.   2D Echo EF 30 to 35%.  LV with grade 1 diastolic dysfunction. LDL 80. Hgb A1c 6.2%. Cardiology consulted. Per cardiology assessment: Acute combined heart failure: Echocardiogram shows LVEF 30 to 35% with regional wall motion abnormalities.  Could represent CAD versus Takotsubo's variant as presented with acute CVA. Discussed with Dr. Pearlean Brownie, she is outside the window for permissive hypertension and okay to start GDMT.  Started Toprol-XL 25 mg daily.  BP soft, no room to add Entresto.  Will add low-dose losartan 12.5 mg daily.  Can add SGLT2 and spironolactone as outpatient. Recommend repeat echocardiogram as outpatient in next 3 months.  If EF remains reduced, would plan ischemic evaluation. Currently euvolemic, no diuresis recommended. Now on aspirin 81 mg and Plavix 75 mg for 3 weeks, then aspirin alone.  Discussed with cardiology no need for anticoagulation at  this time. Loop recorder to be placed today, 3/27   EEG suggestive of moderate diffuse encephalopathy. No seizures or epileptiform discharges were seen throughout the recording. Tolerating regular diet with thin liquids. Overall she requires mod A for UB and LB ADL, min  A for transfers and +2 safety for mobility with HHA. 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    She is typically independent in ADL and mobility. Drives, retired Armed forces operational officer. Enjoys walking 4-6 miles daily with husband.   Patient currently requires min with mobility secondary to muscle weakness and decreased coordination.  Prior to hospitalization, patient was independent  with mobility and lived with Spouse in a House home.  Home access is 2-3 depending on front or garageStairs to enter.  Patient will benefit from skilled PT intervention to maximize safe functional mobility, minimize fall risk, and decrease caregiver burden for planned discharge home with 24 hour supervision.  Anticipate patient will benefit from follow up OP at discharge.  PT - End of Session Activity Tolerance: Tolerates 10 - 20 min activity with multiple rests Endurance Deficit: Yes Endurance Deficit Description: impaired PT Assessment Rehab Potential (ACUTE/IP ONLY): Good PT Patient demonstrates impairments in the following area(s): Balance;Safety;Endurance;Motor PT Transfers Functional Problem(s): Bed Mobility;Bed to Chair;Car;Furniture;Floor PT Locomotion Functional Problem(s): Ambulation;Stairs PT Plan PT Intensity: Minimum of 1-2 x/day ,45 to 90 minutes PT Frequency: 5 out of 7 days PT Duration Estimated Length of Stay: 7 days PT Treatment/Interventions: Ambulation/gait training;Community reintegration;Neuromuscular re-education;Stair training;UE/LE Strength taining/ROM;Balance/vestibular training;Discharge planning;Therapeutic Activities;UE/LE Coordination activities;Therapeutic Exercise;Patient/family education;Functional mobility training PT Transfers Anticipated Outcome(s): supervision, mod I bed mobility. PT Locomotion Anticipated Outcome(s): supervision PT  Recommendation Recommendations for Other Services: None Follow Up Recommendations: Outpatient PT Patient destination: Home Equipment Recommended: To be determined Equipment Details: more than likely, no AD   PT Evaluation Precautions/Restrictions Precautions Precautions: Fall Precaution/Restrictions Comments: Slight L neglect Restrictions Weight Bearing Restrictions Per Provider Order: No General Chart Reviewed: Yes PT Amount of Missed Time (min): 6 Minutes PT Missed Treatment Reason: Other (Comment) (pt in BR.) Family/Caregiver Present: Yes Vital SignsTherapy Vitals Temp: 97.8 F (36.6 C) Temp Source: Oral Pulse Rate: 60 Resp: 18 BP: 91/63 Patient Position (if appropriate): Lying Oxygen Therapy SpO2: 100 % O2 Device: Room Air Pain Pain Assessment Pain Scale: 0-10 Pain Score: 0-No pain Pain Interference Pain Interference Pain Effect on Sleep: 1. Rarely or not at all Pain Interference with Therapy Activities: 1. Rarely or not at all (L knee pain) Pain Interference with Day-to-Day Activities: 1. Rarely or not at all Home Living/Prior Functioning Home Living Available Help at Discharge: Available 24 hours/day;Family Type of Home: House Home Access: Stairs to enter [employer] of Steps: 2-3 depending on front or garage Entrance Stairs-Rails: None Home Layout: Multi-level;Able to live on main level with bedroom/bathroom Bathroom Shower/Tub: Walk-in shower  Lives With: Spouse Prior Function Level of Independence: Independent with transfers;Independent with gait  Able to Take Stairs?: Yes Driving: Yes Vocation: Retired Museum/gallery exhibitions officer Overall Cognitive Status: History of cognitive impairments - at baseline (Spouse states decline recently w/ STM.) Arousal/Alertness: Awake/alert Orientation Level: Oriented to person Month: March Day of Week: Incorrect Attention: Sustained Sustained Attention: Impaired Sustained Attention Impairment:  Verbal basic;Functional basic Memory: Impaired Memory Impairment: Retrieval deficit;Decreased recall of new information;Decreased short term memory Decreased Short Term Memory: Verbal basic;Functional basic Awareness: Appears intact Behaviors: Perseveration Safety/Judgment: Appears intact Sensation Sensation Light Touch:  Appears Intact Coordination Gross Motor Movements are Fluid and Coordinated: No Fine Motor Movements are Fluid and Coordinated: No Heel Shin Test: able to complete R > L. Motor  Motor Motor: Hemiplegia Motor - Skilled Clinical Observations: mild.   Trunk/Postural Assessment  Cervical Assessment Cervical Assessment: Exceptions to [facility] (forward head) Thoracic Assessment Thoracic Assessment: Within Functional Limits Lumbar Assessment Lumbar Assessment: Within Functional Limits Postural Control Postural Control: Deficits on evaluation  Balance Balance Balance Assessed: Yes Static Sitting Balance Static Sitting - Level of Assistance: 5: Stand by assistance Dynamic Sitting Balance Dynamic Sitting - Balance Support: Feet supported Dynamic Sitting - Level of Assistance: 5: Stand by assistance Static Standing Balance Static Standing - Balance Support: During functional activity Static Standing - Level of Assistance: 4: Min assist Dynamic Standing Balance Dynamic Standing - Balance Support: During functional activity;No upper extremity supported Dynamic Standing - Level of Assistance: 4: Min assist Extremity Assessment  RUE Assessment RUE Assessment: Within Functional Limits Active Range of Motion (AROM) Comments: A/ROM WFL In all ranges General Strength Comments: 5/5  strength throughout LUE Assessment LUE Assessment: Within Functional Limits Active Range of Motion (AROM) Comments: WFL in all ranges. General Strength Comments: 5/5 strength throughout RLE Assessment RLE Assessment: Within Functional Limits General Strength Comments: grossly 5/5 LLE  Assessment LLE Assessment: Within Functional Limits General Strength Comments: grossly 5/5  Care Tool Care Tool Bed Mobility Roll left and right activity   Roll left and right assist level: Supervision/Verbal cueing    Sit to lying activity   Sit to lying assist level: Supervision/Verbal cueing    Lying to sitting on side of bed activity   Lying to sitting on side of bed assist level: the ability to move from lying on the back to sitting on the side of the bed with no back support.: Supervision/Verbal cueing     Care Tool Transfers Sit to stand transfer   Sit to stand assist level: Contact Guard/Touching assist    Chair/bed transfer   Chair/bed transfer assist level: Contact Guard/Touching assist    Car transfer   Car transfer assist level: Contact Guard/Touching assist      Care Tool Locomotion Ambulation   Assist level: Minimal Assistance - Patient > 75% Assistive device: No Device Max distance: 150  Walk 10 feet activity   Assist level: Minimal Assistance - Patient > 75% Assistive device: No Device   Walk 50 feet with 2 turns activity   Assist level: Minimal Assistance - Patient > 75% Assistive device: No Device  Walk 150 feet activity   Assist level: Minimal Assistance - Patient > 75% Assistive device: No Device  Walk 10 feet on uneven surfaces activity   Assist level: Minimal Assistance - Patient > 75% Assistive device: Other (comment) (no device.)  Stairs   Assist level: Minimal Assistance - Patient > 75% Stairs assistive device: 1 hand rail Max number of stairs: 12  Walk up/down 1 step activity   Walk up/down 1 step (curb) assist level: Minimal Assistance - Patient > 75% Walk up/down 1 step or curb assistive device: 1 hand rail  Walk up/down 4 steps activity   Walk up/down 4 steps assist level: Minimal Assistance - Patient > 75% Walk up/down 4 steps assistive device: 1 hand rail  Walk up/down 12 steps activity   Walk up/down 12 steps assist level:  Minimal Assistance - Patient > 75% Walk up/down 12 steps assistive device: 1 hand rail  Pick up small objects from floor   Pick up  small object from the floor assist level: Minimal Assistance - Patient > 75% Pick up small object from the floor assistive device: no device.  Wheelchair Is the patient using a wheelchair?: No          Wheel 50 feet with 2 turns activity      Wheel 150 feet activity        Refer to Care Plan for Long Term Goals  SHORT TERM GOAL WEEK 1 PT Short Term Goal 1 (Week 1): STG = LTG 2/2 ELOS  Recommendations for other services: None   Skilled Therapeutic Intervention  Evaluation completed (see details above and below) with education on PT POC and goals and individual treatment initiated with focus on NMR, safety, gait, transfers, balance, endurance.  Pt handed off from NT in BR.  Pt agreeable to participate w/ therapy.  Pt continent of small amt bladder , NT notified that it is not enough for specimen.  Pt wheeled to small gym in w/c as pt unable to continue 2/2 L inattention.  Pt amb w/o AD and min/CGA to SUV height car and CGA in/out of car even though higher surface.  Pt amb up/down ramped surface w/ min/CGA.  Pt negotiated 12 steps w/ 1 rail and min A, 4 steps w/o UE support and min A.  Pt amb x 150' w/o AD and min/CGA only, cues for visual scanning for room number.  Pt performed all bed mobility w/ supervision.  Pt remained supine in bed w/ bed alarm on, all needs in reach and spouse present.  Missed time of 6' 2/2 toileting.  Mobility Bed Mobility Bed Mobility: Rolling Left;Supine to Sit;Sit to Supine Rolling Left: Supervision/Verbal cueing Supine to Sit: Supervision/Verbal cueing Sit to Supine: Supervision/Verbal cueing Transfers Transfers: Sit to Stand;Stand to Sit;Stand Pivot Transfers Sit to Stand: Contact Guard/Touching assist Stand to Sit: Contact Guard/Touching assist Stand Pivot Transfers: Contact Guard/Touching assist Transfer (Assistive  device): None Locomotion  Gait Ambulation: Yes Gait Assistance: Contact Guard/Touching assist;Minimal Assistance - Patient > 75% Gait Distance (Feet): 150 Feet Assistive device: None Gait Assistance Details: Verbal cues for precautions/safety Gait Gait velocity: Decreased Stairs / Additional Locomotion Stairs: Yes Stairs Assistance: Minimal Assistance - Patient > 75% Stair Management Technique: One rail Right Number of Stairs: 12 Height of Stairs: 6 Curb: Minimal Assistance - Patient >75% Wheelchair Mobility Wheelchair Mobility: No   Discharge Criteria: Patient will be discharged from PT if patient refuses treatment 3 consecutive times without medical reason, if treatment goals not met, if there is a change in medical status, if patient makes no progress towards goals or if patient is discharged from hospital.  The above assessment, treatment plan, treatment alternatives and goals were discussed and mutually agreed upon: by patient and by family  Lucio Edward 10/13/2023, 3:37 PM

## 2023-10-13 NOTE — Progress Notes (Signed)
 Inpatient Rehabilitation  Patient information reviewed and entered into eRehab system by Cheri Rous, OTR/L, Rehab Quality Coordinator.   Information including medical coding, functional ability and quality indicators will be reviewed and updated through discharge.

## 2023-10-13 NOTE — Plan of Care (Signed)
  Problem: RH Cognition - SLP Goal: RH LTG Patient will demonstrate orientation with cues Description:  LTG:  Patient will demonstrate orientation to person/place/time/situation with cues (SLP)   Flowsheets (Taken 10/13/2023 1146) LTG: Patient will demonstrate orientation using cueing (SLP): Moderate Assistance - Patient 50 - 74%   Problem: RH Comprehension Communication Goal: LTG Patient will comprehend basic/complex auditory (SLP) Description: LTG: Patient will comprehend basic/complex auditory information with cues (SLP). Flowsheets (Taken 10/13/2023 1146) LTG: Patient will comprehend auditory information with cueing (SLP): Moderate Assistance - Patient 50 - 74%   Problem: RH Expression Communication Goal: LTG Patient will express needs/wants via multi-modal(SLP) Description: LTG:  Patient will express needs/wants via multi-modal communication (gestures/written, etc) with cues (SLP) Flowsheets (Taken 10/13/2023 1146) LTG: Patient will express needs/wants via multimodal communication (gestures/written, etc) with cueing (SLP): Moderate Assistance - Patient 50 - 74% Goal: LTG Patient will increase word finding of common (SLP) Description: LTG:  Patient will increase word finding of common objects/daily info/abstract thoughts with cues using compensatory strategies (SLP). Flowsheets (Taken 10/13/2023 1146) LTG: Patient will increase word finding of common (SLP): Minimal Assistance - Patient > 75%   Problem: RH Memory Goal: LTG Patient will follow step by step directions w/cues (SLP) Description: LTG: Patient will follow step by step directions with cues (SLP). Flowsheets (Taken 10/13/2023 1146) LTG: Patient will follow step by step directions: 2 steps LTG: Patient will follow step by step directions w/cues: Minimal Assistance - Patient > 75%   Problem: RH Attention Goal: LTG Patient will demonstrate this level of attention during functional activites (SLP) Description: LTG:  Patient will  will demonstrate this level of attention during functional activites (SLP) Flowsheets (Taken 10/13/2023 1146) Patient will demonstrate during cognitive/linguistic activities the attention type of:  Focused  Sustained Patient will demonstrate this level of attention during cognitive/linguistic activities in: Controlled LTG: Patient will demonstrate this level of attention during cognitive/linguistic activities with assistance of (SLP): Moderate Assistance - Patient 50 - 74%

## 2023-10-13 NOTE — Progress Notes (Signed)
 PROGRESS NOTE   Subjective/Complaints:  Daughter and husband at bedside during OT eval  Patient can sit with legs dangling over bed but tends to lose balance going backwards during manual muscle testing. Speech without aphasia but has slow processing delayed responses mild dysarthria  ROS negative chest pain shortness of breath nausea vomiting diarrhea positive constipation  Objective:   EP PPM/ICD IMPLANT Result Date: 10/12/2023 CONCLUSIONS:  1. 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 No early apparent complications.   Recent Labs    10/13/23 0654  WBC 6.2  HGB 13.8  HCT 39.9  PLT 187   Recent Labs    10/12/23 0628 10/13/23 0654  NA 135 137  K 3.4* 4.0  CL 101 103  CO2 23 23  GLUCOSE 103* 103*  BUN 13 13  CREATININE 0.68 0.60  CALCIUM 9.2 9.0    Intake/Output Summary (Last 24 hours) at 10/13/2023 0920 Last data filed at 10/13/2023 0735 Gross per 24 hour  Intake 240 ml  Output --  Net 240 ml        Physical Exam: Vital Signs Blood pressure 118/78, pulse 61, temperature 97.8 F (36.6 C), temperature source Oral, resp. rate 18, weight 45.6 kg, SpO2 98%.   General: No acute distress Mood and affect are appropriate Heart: Regular rate and rhythm no rubs murmurs or extra sounds Lungs: Clear to auscultation, breathing unlabored, no rales or wheezes Abdomen: Positive bowel sounds, soft nontender to palpation, nondistended Extremities: No clubbing, cyanosis, or edema Skin: No evidence of breakdown, no evidence of rash Neurologic: Cranial nerves II through XII intact, motor strength is 5-/5 in bilateral deltoid, bicep, tricep, grip, hip flexor, knee extensors, ankle dorsiflexor and plantar flexor Sensory exam normal sensation to light touch  in bilateral upper and lower extremities but patient loses attention during task and is difficult to assess Cerebellar exam  normal finger to nose to finger  in bilateral upper and lower extremities Musculoskeletal: Full range of motion in all 4 extremities. No joint swelling   Assessment/Plan: 1. Functional deficits which require 3+ hours per day of interdisciplinary therapy in a comprehensive inpatient rehab setting. Physiatrist is providing close team supervision and 24 hour management of active medical problems listed below. Physiatrist and rehab team continue to assess barriers to discharge/monitor patient progress toward functional and medical goals  Care Tool:  Bathing              Bathing assist       Upper Body Dressing/Undressing Upper body dressing        Upper body assist      Lower Body Dressing/Undressing Lower body dressing            Lower body assist       Toileting Toileting    Toileting assist       Transfers Chair/bed transfer  Transfers assist           Locomotion Ambulation   Ambulation assist              Walk 10 feet activity   Assist           Walk 50 feet  activity   Assist           Walk 150 feet activity   Assist           Walk 10 feet on uneven surface  activity   Assist           Wheelchair     Assist               Wheelchair 50 feet with 2 turns activity    Assist            Wheelchair 150 feet activity     Assist          Blood pressure 118/78, pulse 61, temperature 97.8 F (36.6 C), temperature source Oral, resp. rate 18, weight 45.6 kg, SpO2 98%.  Medical Problem List and Plan: 1. 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  Antithrombotics: -DVT/anticoagulation:  Pharmaceutical: Lovenox             -antiplatelet therapy: Aspirin and Plavix for three weeks followed by aspirin alone    3. Pain Management: Tylenol as needed   4. Mood/Behavior/Sleep: LCSW to evaluate and provide emotional support             -antipsychotic agents: n/a   5. Neuropsych/cognition: This patient is not capable of making decisions on her own behalf.               -  Notable Hx ADHD and mild cognitive impairment - per family deficits are currently worse than baseline   6. Skin/Wound Care: Routine skin care checks   7. 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  BP soft, no room to add Entresto. Add low-dose losartan 12.5 mg daily.  Can add SGLT2 and spironolactone as outpatient              -daily weight             -maintain K>4, mag>2              -follow-up [facility] as outpt   12: DM: on metformin 1000 mg BID at home>>not restarted; A1c = 6.2%             -monitor CBGs BID             -not requiring insulin coverage>>discontinue SSI   13: History of mild cognitive impairment   14: Hypokalemia: given supplement             -follow-up BMP   15: Mild thrombocytopenia:  follow-up CBC   16: Constipation: prefers prunes/fruits/veg      LOS: 1 days A FACE TO FACE EVALUATION WAS PERFORMED  Erick Colace 10/13/2023, 9:20 AM

## 2023-10-14 DIAGNOSIS — I639 Cerebral infarction, unspecified: Secondary | ICD-10-CM

## 2023-10-14 LAB — GLUCOSE, CAPILLARY
Glucose-Capillary: 113 mg/dL — ABNORMAL HIGH (ref 70–99)
Glucose-Capillary: 95 mg/dL (ref 70–99)
Glucose-Capillary: 107 mg/dL — ABNORMAL HIGH (ref 70–99)

## 2023-10-14 MED ORDER — AMPHETAMINE-DEXTROAMPHETAMINE 10 MG PO TABS
10.0000 mg | ORAL_TABLET | Freq: Every day | ORAL | Status: DC
  Administered 2023-10-15 – 2023-10-20 (×6): 10 mg via ORAL
  Filled 2023-10-14 (×6): qty 1

## 2023-10-14 NOTE — Progress Notes (Signed)
 Prn tylenol given at 2036 for complaint of "tenderness" at loop recorder site. Slept good. Daughter stayed night. Gabrielle Vargas A

## 2023-10-14 NOTE — Progress Notes (Signed)
 Occupational Therapy Session Note  Patient Details  Name: JAMMI MORRISSETTE MRN: [id number] Date of Birth: 1951-02-23  Today's Date: 10/14/2023 OT Individual Time: [phone] [id number] OT Individual Time Calculation (min): 51 min  9 mins missed; daughter assisting pt with hair care   Short Term Goals: Week 1:  OT Short Term Goal 1 (Week 1): STG = LTG (d/t ELOS)  Skilled Therapeutic Interventions/Progress Updates:  Session 1: Pt greeted supine in bed, pt agreeable to OT intervention.   Daughter present during session    Transfers/bed mobility/functional mobility: pt completed supine>sit with supervision. Pt completed all functional ambulation with no AD and CGA.    ADLs:  Grooming:  UB dressing:donned OH shirt with MIN A for orientation of shirt and MIN cues for sequencing.  LB dressing: pt donned pants/brief with CGA for balance Bathing: pt completed bathing with MINA with assist needed for sequencing. Pt benefits from direct cues such as "wash your arm."   Ended session with pt seated in w/c at sink with daughter blow drying her hair.   Session 2: Pt greeted asleep in supine, pt easily able to arouse and pt agreeable to OT intervention.      Transfers/bed mobility/functional mobility: pt completed all functional ambulation with no AD and CGA.   Therapeutic activity:  -pt completed steps ups on airex balance pad with LUE supported by OTA with pt then instructed to reach across midline to retrieve bean bags with LUE and then place in hoop of basketball goal. Pt completed task with CGA with no LOB. Graded task up with pt then instructed to complete a squat on airex pad while reaching across midline to retrieve bean bags with LUE and toss to target on R side with an emphasis on improving LB strength and endurance and improving LUE coordination. Pt completed task with CGA and no LOB.  -pt completed seated visual perceptual task with pt shown visual aid of shape structure with pt then  instructed to rearrange shapes in same structure. Pt completed task with LUE with no coordination deficits noted.  - pt completed functional ambulation task with a focus on dual tasking and working  memory. Pt shown 4 cards on a table with pt instructed to remember numbers and walk to next table to retrieve all matching numbers. Pt completed task with CGA for balance with pt completing task with 100% accuracy.   Cognition: Overall, pt presents with a flat affect during session but did respond to specific questions asked but had a hard time elaborating without prompts. I.e when asking pt what she liked to do in her free time pt needed suggestions such as do you like to read?                  Ended session with pt supine in bed with all needs within reach and bed alarm activated.  Husband present.            Therapy Documentation Precautions:  Precautions Precautions: Fall Recall of Precautions/Restrictions: Impaired Precaution/Restrictions Comments: Slight L neglect Restrictions Weight Bearing Restrictions Per Provider Order: No  Pain: No pain reported during session    Therapy/Group: Individual Therapy  Pollyann Glen [facility] 10/14/2023, 12:21 PM

## 2023-10-14 NOTE — Progress Notes (Signed)
 PROGRESS NOTE   Subjective/Complaints:  BG well controlled Vitals stable PO 25-75% meals Discussed patient's prior diagnosis of ADHD; daughter endorses that they felt her memory and attention was much better when she was still on her home Adderall.  Concern regarding resumption of this causing hypertension, tachycardia.  Agreeable to start at low-dose.  ROS negative chest pain shortness of breath nausea vomiting diarrhea positive constipation  Objective:   EP PPM/ICD IMPLANT Result Date: 10/12/2023 CONCLUSIONS:  1. 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 No early apparent complications.   Recent Labs    10/13/23 0654  WBC 6.2  HGB 13.8  HCT 39.9  PLT 187   Recent Labs    10/12/23 0628 10/13/23 0654  NA 135 137  K 3.4* 4.0  CL 101 103  CO2 23 23  GLUCOSE 103* 103*  BUN 13 13  CREATININE 0.68 0.60  CALCIUM 9.2 9.0    Intake/Output Summary (Last 24 hours) at 10/14/2023 0837 Last data filed at 10/13/2023 2300 Gross per 24 hour  Intake 360 ml  Output --  Net 360 ml        Physical Exam: Vital Signs Blood pressure 126/82, pulse 65, temperature 97.7 F (36.5 C), resp. rate 18, weight 47.6 kg, SpO2 97%.   General: No acute distress.  Sitting up at bedside. Mood and affect are appropriate Heart: Regular rate and rhythm no rubs murmurs or extra sounds Lungs: Clear to auscultation, breathing unlabored, no rales or wheezes Abdomen: Positive bowel sounds, soft nontender to palpation, nondistended Extremities: No clubbing, cyanosis, or edema Skin: No evidence of breakdown, no evidence of rash Neurologic:  Awake, alert, oriented to self and time.  Moderate attention and memory deficits. Cranial nerves II through XII intact, motor strength is 5-/5 in bilateral deltoid, bicep, tricep, grip, hip flexor, knee extensors, ankle dorsiflexor and plantar flexor Sensory exam normal  sensation to light touch  in bilateral upper and lower extremities but patient loses attention during task and is difficult to assess Cerebellar exam normal finger to nose to finger  in bilateral upper and lower extremities Musculoskeletal: Full range of motion in all 4 extremities. No joint swelling   Unchanged 3-29  Assessment/Plan: 1. Functional deficits which require 3+ hours per day of interdisciplinary therapy in a comprehensive inpatient rehab setting. Physiatrist is providing close team supervision and 24 hour management of active medical problems listed below. Physiatrist and rehab team continue to assess barriers to discharge/monitor patient progress toward functional and medical goals  Care Tool:  Bathing    Body parts bathed by patient: Right arm, Left arm, Chest, Abdomen, Front perineal area, Right upper leg, Left upper leg, Face   Body parts bathed by helper: Buttocks, Left lower leg, Right lower leg     Bathing assist Assist Level: Minimal Assistance - Patient > 75%     Upper Body Dressing/Undressing Upper body dressing   What is the patient wearing?: Pull over shirt    Upper body assist Assist Level: Minimal Assistance - Patient > 75%    Lower Body Dressing/Undressing Lower body dressing      What is the patient wearing?: Incontinence brief,  Pants     Lower body assist Assist for lower body dressing: Moderate Assistance - Patient 50 - 74%     Toileting Toileting    Toileting assist Assist for toileting: Moderate Assistance - Patient 50 - 74%     Transfers Chair/bed transfer  Transfers assist     Chair/bed transfer assist level: Contact Guard/Touching assist     Locomotion Ambulation   Ambulation assist      Assist level: Minimal Assistance - Patient > 75% Assistive device: No Device Max distance: 150   Walk 10 feet activity   Assist     Assist level: Minimal Assistance - Patient > 75% Assistive device: No Device   Walk 50 feet  activity   Assist    Assist level: Minimal Assistance - Patient > 75% Assistive device: No Device    Walk 150 feet activity   Assist    Assist level: Minimal Assistance - Patient > 75% Assistive device: No Device    Walk 10 feet on uneven surface  activity   Assist     Assist level: Minimal Assistance - Patient > 75% Assistive device: Other (comment) (no device.)   Wheelchair     Assist Is the patient using a wheelchair?: No             Wheelchair 50 feet with 2 turns activity    Assist            Wheelchair 150 feet activity     Assist          Blood pressure 126/82, pulse 65, temperature 97.7 F (36.5 C), resp. rate 18, weight 47.6 kg, SpO2 97%.  Medical Problem List and Plan: 1. 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  Antithrombotics: -DVT/anticoagulation:  Pharmaceutical: Lovenox             -antiplatelet therapy: Aspirin and Plavix for three weeks followed by aspirin alone   3. Pain Management: Tylenol as needed   4. Mood/Behavior/Sleep: LCSW to evaluate and provide emotional support             -antipsychotic agents: n/a   5. Neuropsych/cognition: This patient is not capable of making decisions on her own behalf.               -  Notable Hx ADHD and mild cognitive impairment - per family deficits are currently worse than baseline   -3-29: At home, takes Adderall 20 mg twice daily; resume 10 mg daily in AM for concentration and memory  6. Skin/Wound Care: Routine skin care checks   -DC peripheral IV 7. Fluids/Electrolytes/Nutrition: strict Is and Os and follow-up chemistries             -start carb modified diet             -continue B12 weekly for history of deficiency   -Admission labs stable 8: Hypertension: monitor TID and prn (home  hydrochlorothiazide 25 mg held)             -continue losartan 12.5 mg daily             -continue metoprolol succ 25 mg daily  -  Vital stable, continue current regimen       10/14/2023    7:28 PM 10/14/2023    1:51 PM 10/14/2023    5:00 AM  Vitals with BMI  Weight   104 lbs 15 oz  BMI   21.18  Systolic 105 99   Diastolic 57 62   Pulse 76 63     9: Hyperlipidemia: continue statin, Zetia   10: Hypothyroidism: continue Synthroid   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  BP soft, no room to add Entresto. Add low-dose losartan 12.5 mg daily.  Can add SGLT2 and spironolactone as outpatient              -daily weight             -maintain K>4, mag>2              -follow-up [facility] as outpt   -3-29: Weight slightly up, however euvolemic appearance, no respiratory distress.  Monitor.  Filed Weights   10/12/23 1941 10/13/23 0630 10/14/23 0500  Weight: 45.7 kg 45.6 kg 47.6 kg    12: DM: on metformin 1000 mg BID at home>>not restarted; A1c = 6.2%             -monitor CBGs BID             -not requiring insulin coverage>>discontinue SSI   13: History of mild cognitive impairment   14: Hypokalemia: given supplement             -follow-up BMP--stable   15: Mild thrombocytopenia: follow-up CBC--stable   16: Constipation: prefers prunes/fruits/veg   -Last bowel movement 3-28    LOS: 2 days A FACE TO FACE EVALUATION WAS PERFORMED  [name] 10/14/2023, 8:37 AM

## 2023-10-15 DIAGNOSIS — I6340 Cerebral infarction due to embolism of unspecified cerebral artery: Secondary | ICD-10-CM | POA: Diagnosis not present

## 2023-10-15 LAB — GLUCOSE, CAPILLARY
Glucose-Capillary: 115 mg/dL — ABNORMAL HIGH (ref 70–99)
Glucose-Capillary: 87 mg/dL (ref 70–99)
Glucose-Capillary: 115 mg/dL — ABNORMAL HIGH (ref 70–99)

## 2023-10-15 NOTE — IPOC Note (Signed)
 Overall Plan of Care [facility]) Patient Details Name: Gabrielle Vargas MRN: [id number] DOB: [birth year]  Admitting Diagnosis: CVA (cerebral vascular accident) [facility])  Hospital Problems: Principal Problem:   CVA (cerebral vascular accident) [facility])     Functional Problem List: Nursing Safety, Endurance, Medication Management  PT Balance, Safety, Endurance, Motor  OT Balance, Sensory, Cognition, Endurance, Motor, Safety, Perception  SLP Cognition, Linguistic  TR         Basic ADL's: OT Eating, Grooming, Bathing, Dressing, Toileting     Advanced  ADL's: OT       Transfers: PT Bed Mobility, Bed to Chair, Car, [employer], Dietitian, Technical brewer: PT Ambulation, Stairs     Additional Impairments: OT Fuctional Use of Upper Extremity  SLP Communication, Social Cognition comprehension, expression Social Interaction, Memory, Attention  TR      Anticipated Outcomes Item Anticipated Outcome  Self Feeding    Swallowing  n/a   Basic self-care  SBA  Toileting  SBA   Bathroom Transfers SBA  Bowel/Bladder  n/a  Transfers  supervision, mod I bed mobility.  Locomotion  supervision  Communication  min-mod assist  Cognition  min-mod assist  Pain  n/a  Safety/Judgment  manage w cues   Therapy Plan: PT Intensity: Minimum of 1-2 x/day ,45 to 90 minutes PT Frequency: 5 out of 7 days PT Duration Estimated Length of Stay: 7 days OT Intensity: Minimum of 1-2 x/day, 45 to 90 minutes OT Frequency: 5 out of 7 days OT Duration/Estimated Length of Stay: 7 days SLP Intensity: Minumum of 1-2 x/day, 30 to 90 minutes SLP Frequency: 3 to 5 out of 7 days SLP Duration/Estimated Length of Stay: 10-12 days   Team Interventions: Nursing Interventions Patient/Family Education, Medication Management, Discharge Planning, Disease Management/Prevention  PT interventions Ambulation/gait training, Community reintegration, Neuromuscular re-education, Stair training, UE/LE  Strength taining/ROM, Warden/ranger, Discharge planning, Therapeutic Activities, UE/LE Coordination activities, Therapeutic Exercise, Patient/family education, Functional mobility training  OT Interventions Balance/vestibular training, Disease mangement/prevention, Neuromuscular re-education, Self Care/advanced ADL retraining, Therapeutic Exercise, Cognitive remediation/compensation, DME/adaptive equipment instruction, UE/LE Strength taining/ROM, Community reintegration, Equities trader education, UE/LE Coordination activities, Discharge planning, Functional mobility training, Therapeutic Activities, Psychosocial support, Visual/perceptual remediation/compensation  SLP Interventions Speech/Language facilitation, Cueing hierarchy, Functional tasks, Patient/family education, Therapeutic Exercise, Therapeutic Activities  TR Interventions    SW/CM Interventions Discharge Planning, Psychosocial Support, Patient/Family Education   Barriers to Discharge MD  Medical stability, Home enviroment access/loayout, Incontinence, and Behavior  Nursing Decreased caregiver support, Home environment access/layout multi level 2 ste; w spouse,Able to live on main level with bedroom/bathroom  PT      OT      SLP      SW       Team Discharge Planning: Destination: PT-Home ,OT- Home , SLP-Home Projected Follow-up: PT-Outpatient PT, OT-  Outpatient OT, SLP-24 hour supervision/assistance, Home Health SLP Projected Equipment Needs: PT-To be determined, OT- Tub/shower seat, To be determined, SLP-None recommended by SLP Equipment Details: PT-more than likely, no AD, OT-Owns no equipment Patient/family involved in discharge planning: PT- Family member/caregiver,  OT-Patient, Family member/caregiver, SLP-Patient, Family member/caregiver  MD ELOS: 10-12 days Medical Rehab Prognosis:  Good Assessment: The patient has been admitted for CIR therapies with the diagnosis of bilateral frontal and left parietal  embolic infarcts s/p TNK . The team will be addressing functional mobility, strength, stamina, balance, safety, adaptive techniques and equipment, self-care, bowel and bladder mgt, patient and caregiver education. Goals have been set at supervision PT,  OT, and SLP. Anticipated discharge destination is home.       See Team Conference Notes for weekly updates to the plan of care

## 2023-10-15 NOTE — Progress Notes (Signed)
 PROGRESS NOTE   Subjective/Complaints:  Pt doing well, slept well, denies pain, LBM this morning per pt but not documented since 3/28, urinating fine. Denies any other complaints or concerns today.   ROS: negative chest pain, shortness of breath, abd pain, nausea, vomiting, diarrhea positive constipation-?improved  Objective:   No results found.  Recent Labs    10/13/23 0654  WBC 6.2  HGB 13.8  HCT 39.9  PLT 187   Recent Labs    10/13/23 0654  NA 137  K 4.0  CL 103  CO2 23  GLUCOSE 103*  BUN 13  CREATININE 0.60  CALCIUM 9.0    Intake/Output Summary (Last 24 hours) at 10/15/2023 1149 Last data filed at 10/15/2023 1000 Gross per 24 hour  Intake 957 ml  Output 0 ml  Net 957 ml        Physical Exam: Vital Signs Blood pressure 108/74, pulse 61, temperature 97.7 F (36.5 C), resp. rate 18, weight 47.2 kg, SpO2 99%.   General: No acute distress.  Resting comfortably in bed. Mood and affect are appropriate Heart: Regular rate and rhythm no rubs murmurs or extra sounds Lungs: Clear to auscultation, breathing unlabored, no rales or wheezes Abdomen: Positive bowel sounds, soft nontender to palpation, nondistended Extremities: No clubbing, cyanosis, or edema Skin: No evidence of breakdown, no evidence of rash over exposed surfaces  PRIOR EXAMS: Neurologic:  Awake, alert, oriented to self and time.  Moderate attention and memory deficits. Cranial nerves II through XII intact, motor strength is 5-/5 in bilateral deltoid, bicep, tricep, grip, hip flexor, knee extensors, ankle dorsiflexor and plantar flexor Sensory exam normal sensation to light touch  in bilateral upper and lower extremities but patient loses attention during task and is difficult to assess Cerebellar exam normal finger to nose to finger  in bilateral upper and lower extremities Musculoskeletal: Full range of motion in all 4 extremities. No joint  swelling    Assessment/Plan: 1. Functional deficits which require 3+ hours per day of interdisciplinary therapy in a comprehensive inpatient rehab setting. Physiatrist is providing close team supervision and 24 hour management of active medical problems listed below. Physiatrist and rehab team continue to assess barriers to discharge/monitor patient progress toward functional and medical goals  Care Tool:  Bathing    Body parts bathed by patient: Right arm, Left arm, Chest, Abdomen, Front perineal area, Right upper leg, Left upper leg, Face   Body parts bathed by helper: Buttocks, Left lower leg, Right lower leg     Bathing assist Assist Level: Minimal Assistance - Patient > 75%     Upper Body Dressing/Undressing Upper body dressing   What is the patient wearing?: Pull over shirt    Upper body assist Assist Level: Minimal Assistance - Patient > 75%    Lower Body Dressing/Undressing Lower body dressing      What is the patient wearing?: Incontinence brief, Pants     Lower body assist Assist for lower body dressing: Moderate Assistance - Patient 50 - 74%     Toileting Toileting    Toileting assist Assist for toileting: Moderate Assistance - Patient 50 - 74%     Transfers Chair/bed transfer  Transfers assist     Chair/bed transfer assist level: Contact Guard/Touching assist     Locomotion Ambulation   Ambulation assist      Assist level: Minimal Assistance - Patient > 75% Assistive device: No Device Max distance: 150   Walk 10 feet activity   Assist     Assist level: Minimal Assistance - Patient > 75% Assistive device: No Device   Walk 50 feet activity   Assist    Assist level: Minimal Assistance - Patient > 75% Assistive device: No Device    Walk 150 feet activity   Assist    Assist level: Minimal Assistance - Patient > 75% Assistive device: No Device    Walk 10 feet on uneven surface  activity   Assist     Assist level:  Minimal Assistance - Patient > 75% Assistive device: Other (comment) (no device.)   Wheelchair     Assist Is the patient using a wheelchair?: No             Wheelchair 50 feet with 2 turns activity    Assist            Wheelchair 150 feet activity     Assist          Blood pressure 108/74, pulse 61, temperature 97.7 F (36.5 C), resp. rate 18, weight 47.2 kg, SpO2 99%.  Medical Problem List and Plan: 1. 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  Antithrombotics: -DVT/anticoagulation:  Pharmaceutical: Lovenox 40mg  daily -antiplatelet therapy: Aspirin and Plavix for three weeks followed by aspirin alone   3. Pain Management: Tylenol and robaxin as needed   4. Mood/Behavior/Sleep: LCSW to evaluate and provide emotional support             -antipsychotic agents: n/a   5. Neuropsych/cognition: This patient is not capable of making decisions on her own behalf. -  Notable Hx ADHD and mild cognitive impairment - per family deficits are currently worse than baseline -3-29: At home, takes Adderall 20 mg twice daily; resume 10 mg daily in AM for concentration and memory  6. Skin/Wound Care: Routine skin care checks   -DC peripheral IV 7. Fluids/Electrolytes/Nutrition: strict Is and Os and follow-up chemistries             -start carb modified diet             -continue B12 weekly for history of deficiency   -Admission labs stable 8: Hypertension: monitor TID and prn (home hydrochlorothiazide 25 mg held)             -continue losartan 12.5 mg daily             -continue metoprolol succ 25 mg daily  -10/15/23 BPs stable, continue    Vitals:   10/13/23 0438 10/13/23 1432 10/13/23 1921 10/14/23 0448  BP: 118/78 91/63 118/87 126/82   10/14/23 1351 10/14/23 1928 10/15/23 0507  BP: 99/62  (!) 105/57 108/74     9: Hyperlipidemia: continue atorvastatin 40mg  daily, Zetia--zetia not reordered, weekday team to assess if this is still needed?   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  BP soft, no room to add Entresto. Add low-dose losartan 12.5 mg daily.  Can add SGLT2 and spironolactone as outpatient              -daily weight             -maintain K>4, mag>2              -follow-up [facility] as outpt -3-29: Weight slightly up, however euvolemic appearance, no respiratory distress.  Monitor. Stable 10/15/23  Filed Weights   10/13/23 0630 10/14/23 0500 10/15/23 0500  Weight: 45.6 kg 47.6 kg 47.2 kg    12: DM: on metformin 1000 mg BID at home>>not restarted; A1c = 6.2%             -monitor CBGs BID             -not requiring insulin coverage>>discontinue SSI  -10/15/23 CBGs fine, may consider d/c'ing CBG checks if continued  CBG (last 3)  Recent Labs    10/14/23 1615 10/15/23 0606 10/15/23 1123  GLUCAP 107* 115* 87      13: History of mild cognitive impairment-- see #5   14: Hypokalemia: given supplement             -follow-up BMP--stable   15: Mild thrombocytopenia: follow-up CBC--stable   16: Constipation: prefers prunes/fruits/veg -10/15/23 LBM this morning per pt but not documented since 3/28; monitor    LOS: 3 days A FACE TO FACE EVALUATION WAS PERFORMED  [address] 10/15/2023, 11:49 AM

## 2023-10-15 NOTE — Progress Notes (Addendum)
 Slept good.  Loop recorder site with gauze & transparent dressing. Family stays the night. Gabrielle Vargas

## 2023-10-16 DIAGNOSIS — I6340 Cerebral infarction due to embolism of unspecified cerebral artery: Secondary | ICD-10-CM | POA: Diagnosis not present

## 2023-10-16 LAB — BASIC METABOLIC PANEL WITH GFR
Anion gap: 7 (ref 5–15)
BUN: 13 mg/dL (ref 8–23)
CO2: 26 mmol/L (ref 22–32)
Calcium: 9.1 mg/dL (ref 8.9–10.3)
Chloride: 106 mmol/L (ref 98–111)
Creatinine, Ser: 0.79 mg/dL (ref 0.44–1.00)
GFR, Estimated: >60 mL/min (ref >60)
Glucose, Bld: 102 mg/dL — ABNORMAL HIGH (ref 70–99)
Potassium: 4.1 mmol/L (ref 3.5–5.1)
Sodium: 139 mmol/L (ref 135–145)

## 2023-10-16 LAB — CBC
HCT: 39.2 % (ref 36.0–46.0)
Hemoglobin: 13.1 g/dL (ref 12.0–15.0)
MCH: 28.1 pg (ref 26.0–34.0)
MCHC: 33.4 g/dL (ref 30.0–36.0)
MCV: 83.9 fL (ref 80.0–100.0)
Platelets: 255 10*3/uL (ref 150–400)
RBC: 4.67 MIL/uL (ref 3.87–5.11)
RDW: 13 % (ref 11.5–15.5)
WBC: 7 10*3/uL (ref 4.0–10.5)
nRBC: 0 % (ref 0.0–0.2)

## 2023-10-16 LAB — GLUCOSE, CAPILLARY
Glucose-Capillary: 111 mg/dL — ABNORMAL HIGH (ref 70–99)
Glucose-Capillary: 124 mg/dL — ABNORMAL HIGH (ref 70–99)

## 2023-10-16 NOTE — Progress Notes (Signed)
 Occupational Therapy Session Note  Patient Details  Name: Gabrielle Vargas MRN: [id number] Date of Birth: [birth year]  Today's Date: 10/16/2023 OT Individual Time: [phone] OT Individual Time Calculation (min): 70 min    Short Term Goals: Week 1:  OT Short Term Goal 1 (Week 1): STG = LTG (d/t ELOS)  Skilled Therapeutic Interventions/Progress Updates:     Pt received semi-reclined in bed with husband, Greggory Stallion, present in room. Pt presenting to be in good spirits receptive to skilled OT session reporting 0/10 pain- OT offering intermittent rest breaks, repositioning, and therapeutic support to optimize participation in therapy session. Focused this session on functional cognition, L side attention, and activity tolerance.   Pt's husband inquiring about being able to take Pt to bathroom without help of therapy or nursing staff. Provided education on hospital policies including use of bed/chair alarms, gait belt use, call bell, and use of non-slip socks with Pt and Pt's husband receptive to education. Engaged Pt's husband in assisting Pt to bathroom with Pt completing supine>EOB with supervision using bed features and ambulatory transfer to bathroom with CGA-light min HHA with verbal cues required for safety to attend to L visual field. Pt requiring CGA to complete 3/3 toileting tasks. Pt able to maintain standing balance while washing hands at sink with CGA no AD. Informed RN and nursing staff Pt's husband has participated in training and is safe to assist Pt to bathroom between therapy sessions. Education also provided on "call don't fall" policy if any assistance is needed, Pt or Pt's husband should utilize call bell.   Engaged Pt in completing functional mobility to therapy gym no AD for endurance training with CGA provided for balance.   While seated at table top, engaged Pt in completing visual scanning matching activity to work on visual scanning to L, working memory, following single step  commands, and attention. Pt tasked with retrieving cards from L side of table and matching them to same card positioned on R side of table. Pt able to complete task following demonstration with min verbal cues and increased time with 100% accuracy.   Graded up cognitive challenge of task with Pt instructed to complete dual tasking functional cognition activity while standing at table top. Pt provided with deck of cards and instructed to sort cards by colors (green, red, and purple), number (1-12), and sequence cards in correct order from 1-12. Incorporated FM translation skills and L side attention into activity by having Pt hold cards in R hand and utilize L hand to sort cards into piles. Pt required min-mod questioning cues to complete activity and increased time d/t delayed processing skills and slow task initiation.   Provided education on medication management skills and utilizing a timer and pill box to stay organized when taking medications. Also provided education on adverse side effects of not adhering to medication regimen with Pt receptive to education. Engaged Pt in simulated medication management task with Pt instructed to organize 4 medications into pill box by following single step commands written on pill bottles. Pt requiring max multimodal cues +increased time to initiate task and Pt noted to become overwhelmed by cognitive challenge so task was terminated after organizing 1/4 medications to support moral. Recommended Pt receive assistance with medication management at d/c with Pt and Pt's husband receptive to plan.   Pt ambulated back to room with CGA and min verbal cues for L side attention. EOB > supine CGA. Pt was left resting in bed with call bell in reach, bed  alarm on, and all needs met.    Therapy Documentation Precautions:  Precautions Precautions: Fall Recall of Precautions/Restrictions: Impaired Precaution/Restrictions Comments: Slight L neglect Restrictions Weight Bearing  Restrictions Per Provider Order: No  Therapy/Group: Individual Therapy  Clide Deutscher 10/16/2023, 7:48 AM

## 2023-10-16 NOTE — Plan of Care (Signed)
 Goals upgraded due to progress since evaluation Problem: RH Cognition - SLP Goal: RH LTG Patient will demonstrate orientation with cues Description:  LTG:  Patient will demonstrate orientation to person/place/time/situation with cues (SLP)   Flowsheets (Taken 10/16/2023 1220) LTG Patient will demonstrate orientation to:  Person  Place  Time  Situation LTG: Patient will demonstrate orientation using cueing (SLP): Modified Independent   Problem: RH Comprehension Communication Goal: LTG Patient will comprehend basic/complex auditory (SLP) Description: LTG: Patient will comprehend basic/complex auditory information with cues (SLP). Flowsheets (Taken 10/16/2023 1220) LTG: Patient will comprehend: Complex auditory information LTG: Patient will comprehend auditory information with cueing (SLP): Minimal Assistance - Patient > 75%   Problem: RH Expression Communication Goal: LTG Patient will express needs/wants via multi-modal(SLP) Description: LTG:  Patient will express needs/wants via multi-modal communication (gestures/written, etc) with cues (SLP) Flowsheets (Taken 10/16/2023 1220) LTG: Patient will express needs/wants via multimodal communication (gestures/written, etc) with cueing (SLP): Modified Independent Goal: LTG Patient will increase word finding of common (SLP) Description: LTG:  Patient will increase word finding of common objects/daily info/abstract thoughts with cues using compensatory strategies (SLP). Flowsheets (Taken 10/16/2023 1220) LTG: Patient will increase word finding of common (SLP): Minimal Assistance - Patient > 75% Patient will use compensatory strategies to increase word finding of:  Abstract thoughts  Daily info   Problem: RH Memory Goal: LTG Patient will follow step by step directions w/cues (SLP) Description: LTG: Patient will follow step by step directions with cues (SLP). Flowsheets (Taken 10/16/2023 1220) LTG: Patient will follow step by step directions: 2  steps LTG: Patient will follow step by step directions w/cues: Modified Independent   Problem: RH Attention Goal: LTG Patient will demonstrate this level of attention during functional activites (SLP) Description: LTG:  Patient will will demonstrate this level of attention during functional activites (SLP) Flowsheets (Taken 10/16/2023 1220) LTG: Patient will demonstrate this level of attention during cognitive/linguistic activities with assistance of (SLP): Modified Independent

## 2023-10-16 NOTE — Progress Notes (Signed)
 Physical Therapy Session Note  Patient Details  Name: Gabrielle Vargas MRN: [id number] Date of Birth: Oct 07, 1950  Today's Date: 10/14/2023 PT Individual Time: 1018-1130  PT Individual Time Calculation (min): 72 min  Short Term Goals: Week 1:  PT Short Term Goal 1 (Week 1): STG = LTG 2/2 ELOS  Skilled Therapeutic Interventions/Progress Updates:  Patient standing at sink with daughter next to her on entrance to room. Patient alert and agreeable to PT session. She is brushing her teeth and takes an extensive time to complete task. 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   Patient with no pain complaint at start of session.  Pt is slow to respond throughout session with any question.   Therapeutic Activity: Transfers: Pt performed sit<>stand and stand pivot transfers throughout session with up to MinA d/t anterior bias in static standing. Provided vc/ tc for bringing weight off toes and back to heels with pt unable to self correct.  Gait Training:  Pt ambulated 250' x2 using no AD with variable MinA d/t pt's severe anterior bias when upright. Demonstrated increased pace d/t anterior lean and weight shifted over toes. Pt also very visually stimulated and consistently looking around and into rooms and at other activities without regard to balance. Provided vc/ tc for upright positioning, level gaze, slowing pace. Is only able to correct for very brief periods.   Neuromuscular Re-ed: NMR facilitated during session with focus on standing balance. Pt guided in standing reach to object behind her to either side  and then stood on wedge in order to promote posterior balance. Guided pt in collection of small objects on floor and pt initially losing balance forward with reach. Pt is able to catch self with MinA from therapist. Pt also guided in pull of [place]igz from mirror. Pt does require hand hold to mirror to maintain balance. Is able to name and collect all  colors correctly. Continued forward lean  noted with return ambulation to room. NMR performed for improvements in motor control and coordination, balance, sequencing, judgement, and self confidence/ efficacy in performing all aspects of mobility at highest level of independence.   Patient supine in bed at end of session with brakes locked, bed alarm set, and all needs within reach.   Therapy Documentation Precautions:  Precautions Precautions: Fall Recall of Precautions/Restrictions: Impaired Precaution/Restrictions Comments: Slight L neglect Restrictions Weight Bearing Restrictions Per Provider Order: No  Pain:  No pain related this session.   Therapy/Group: Individual Therapy  Loel Dubonnet PT, DPT, CSRS 10/14/2023, 3:12 PM

## 2023-10-16 NOTE — Progress Notes (Incomplete)
 Physical Therapy Session Note  Patient Details  Name: Gabrielle Vargas MRN: [id number] Date of Birth: 1951-04-03  Today's Date: 10/16/2023 PT Individual Time:  -      Short Term Goals: Week 1:  PT Short Term Goal 1 (Week 1): STG = LTG 2/2 ELOS  Skilled Therapeutic Interventions/Progress Updates:      Therapy Documentation Precautions:  Precautions Precautions: Fall Recall of Precautions/Restrictions: Impaired Precaution/Restrictions Comments: Slight L neglect Restrictions Weight Bearing Restrictions Per Provider Order: No General:   Vital Signs:   Pain:   Mobility:   Locomotion :    Trunk/Postural Assessment :    Balance:   Exercises:   Other Treatments:      Therapy/Group: Individual Therapy  Loel Dubonnet PT, DPT, CSRS 10/16/2023, 12:41 PM

## 2023-10-16 NOTE — Progress Notes (Signed)
 PROGRESS NOTE   Subjective/Complaints:  Has pain with lovenox injection , per daughter has ambulated to gym.  Also have reviewed PT notes   ROS: negative chest pain, shortness of breath, abd pain, nausea, vomiting, diarrhea positive constipation-?improved  Objective:   No results found.  Recent Labs    10/16/23 0536  WBC 7.0  HGB 13.1  HCT 39.2  PLT 255   Recent Labs    10/16/23 0536  NA 139  K 4.1  CL 106  CO2 26  GLUCOSE 102*  BUN 13  CREATININE 0.79  CALCIUM 9.1    Intake/Output Summary (Last 24 hours) at 10/16/2023 1016 Last data filed at 10/16/2023 0700 Gross per 24 hour  Intake 960 ml  Output 0 ml  Net 960 ml        Physical Exam: Vital Signs Blood pressure 120/75, pulse 62, temperature 97.9 F (36.6 C), resp. rate 18, weight 48.7 kg, SpO2 98%.   General: No acute distress.  Resting comfortably in bed. Mood and affect are appropriate Heart: Regular rate and rhythm no rubs murmurs or extra sounds Lungs: Clear to auscultation, breathing unlabored, no rales or wheezes Abdomen: Positive bowel sounds, soft nontender to palpation, nondistended Extremities: No clubbing, cyanosis, or edema Skin: No evidence of breakdown, no evidence of rash over exposed surfaces  PRIOR EXAMS: Neurologic:  Awake, alert, oriented to self and time.  Moderate attention and memory deficits. Cranial nerves II through XII intact, motor strength is 5-/5 in bilateral deltoid, bicep, tricep, grip, hip flexor, knee extensors, ankle dorsiflexor and plantar flexor Sensory exam normal sensation to light touch  in bilateral upper and lower extremities but patient loses attention during task and is difficult to assess Cerebellar exam normal finger to nose to finger  in bilateral upper and lower extremities Musculoskeletal: Full range of motion in all 4 extremities. No joint swelling    Assessment/Plan: 1. Functional deficits  which require 3+ hours per day of interdisciplinary therapy in a comprehensive inpatient rehab setting. Physiatrist is providing close team supervision and 24 hour management of active medical problems listed below. Physiatrist and rehab team continue to assess barriers to discharge/monitor patient progress toward functional and medical goals  Care Tool:  Bathing    Body parts bathed by patient: Right arm, Left arm, Chest, Abdomen, Front perineal area, Right upper leg, Left upper leg, Face   Body parts bathed by helper: Buttocks, Left lower leg, Right lower leg     Bathing assist Assist Level: Minimal Assistance - Patient > 75%     Upper Body Dressing/Undressing Upper body dressing   What is the patient wearing?: Pull over shirt    Upper body assist Assist Level: Minimal Assistance - Patient > 75%    Lower Body Dressing/Undressing Lower body dressing      What is the patient wearing?: Incontinence brief, Pants     Lower body assist Assist for lower body dressing: Moderate Assistance - Patient 50 - 74%     Toileting Toileting    Toileting assist Assist for toileting: Moderate Assistance - Patient 50 - 74%     Transfers Chair/bed transfer  Transfers assist     Chair/bed  transfer assist level: Contact Guard/Touching assist     Locomotion Ambulation   Ambulation assist      Assist level: Minimal Assistance - Patient > 75% Assistive device: No Device Max distance: 150   Walk 10 feet activity   Assist     Assist level: Minimal Assistance - Patient > 75% Assistive device: No Device   Walk 50 feet activity   Assist    Assist level: Minimal Assistance - Patient > 75% Assistive device: No Device    Walk 150 feet activity   Assist    Assist level: Minimal Assistance - Patient > 75% Assistive device: No Device    Walk 10 feet on uneven surface  activity   Assist     Assist level: Minimal Assistance - Patient > 75% Assistive device:  Other (comment) (no device.)   Wheelchair     Assist Is the patient using a wheelchair?: No             Wheelchair 50 feet with 2 turns activity    Assist            Wheelchair 150 feet activity     Assist          Blood pressure 120/75, pulse 62, temperature 97.9 F (36.6 C), resp. rate 18, weight 48.7 kg, SpO2 98%.  Medical Problem List and Plan: 1. 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  Antithrombotics: -DVT/anticoagulation:  Pharmaceutical: Lovenox 40mg  daily -antiplatelet therapy: Aspirin and Plavix for three weeks followed by aspirin alone   3. Pain Management: Tylenol and robaxin as needed   4. Mood/Behavior/Sleep: LCSW to evaluate and provide emotional support             -antipsychotic agents: n/a   5. Neuropsych/cognition: This patient is not capable of making decisions on her own behalf. -  Notable Hx ADHD and mild cognitive impairment - per family deficits are currently worse than baseline -3-29: At home, takes Adderall 20 mg twice daily; resume 10 mg daily in AM for concentration and memory  6. Skin/Wound Care: Routine skin care checks   -DC peripheral IV 7. Fluids/Electrolytes/Nutrition: strict Is and Os and follow-up chemistries             -start carb modified diet             -continue B12 weekly for history of deficiency   -Admission labs stable 8: Hypertension: monitor TID and prn (home hydrochlorothiazide 25 mg held)             -continue losartan 12.5 mg daily             -continue metoprolol succ 25 mg daily  -10/15/23 BPs stable, continue    Vitals:   10/13/23 0438 10/13/23 1432 10/13/23 1921 10/14/23 0448  BP: 118/78 91/63 118/87 126/82   10/14/23 1351 10/14/23 1928 10/15/23 0507 10/15/23 1329  BP: 99/62 (!) 105/57 108/74 106/70   10/15/23  1913 10/16/23 0457  BP: 116/68 120/75     9: Hyperlipidemia: continue atorvastatin 40mg  daily, Zetia--zetia not reordered, weekday team to assess if this is still needed?  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  BP soft, no room to add Entresto. Add low-dose losartan 12.5 mg daily.  Can add SGLT2 and spironolactone as outpatient              -daily weight             -maintain K>4, mag>2              -follow-up [facility] as outpt -3-29: Weight slightly up, however euvolemic appearance, no respiratory distress.  Monitor. Stable 10/15/23  Filed Weights   10/14/23 0500 10/15/23 0500 10/16/23 0500  Weight: 47.6 kg 47.2 kg 48.7 kg    12: DM: on metformin 1000 mg BID at home>>not restarted; A1c = 6.2%             -monitor CBGs BID             -not requiring insulin coverage>>discontinue SSI  3/31  CBG (last 3)  Recent Labs    10/15/23 1123 10/15/23 1639 10/16/23 0621  GLUCAP 87 115* 111*      13: History of mild cognitive impairment-- see #5   14: Hypokalemia: given supplement- resolved     Latest Ref Rng & Units 10/16/2023    5:36 AM 10/13/2023    6:54 AM 10/12/2023    6:28 AM  BMP  Glucose 70 - 99 mg/dL 147  829  562   BUN 8 - 23 mg/dL 13  13  13    Creatinine 0.44 - 1.00 mg/dL 1.30  8.65  7.84   Sodium 135 - 145 mmol/L 139  137  135   Potassium 3.5 - 5.1 mmol/L 4.1  4.0  3.4   Chloride 98 - 111 mmol/L 106  103  101   CO2 22 - 32 mmol/L 26  23  23    Calcium 8.9 - 10.3 mg/dL 9.1  9.0  9.2       15: Mild thrombocytopenia: follow-up CBC--stable   16: Constipation: prefers prunes/fruits/veg -10/15/23 LBM this morning per pt but not documented since 3/28; monitor    LOS: 4 days A FACE TO FACE EVALUATION WAS PERFORMED  Erick Colace 10/16/2023, 10:16 AM

## 2023-10-16 NOTE — Progress Notes (Signed)
 Speech Language Pathology Daily Session Note  Patient Details  Name: AILEENA IGLESIA MRN: [id number] Date of Birth: Jul 06, 1951  Today's Date: 10/16/2023 SLP Individual Time: 0931-1030 SLP Individual Time Calculation (min): 59 min  Short Term Goals: Week 1: SLP Short Term Goal 1 (Week 1): Pt will name common objects and their functions with >75% accuracy given min cues. SLP Short Term Goal 2 (Week 1): Pt will follow 1-2 step directions with >75% accuracy given min cues. SLP Short Term Goal 3 (Week 1): Pt will produce sponatenous verbalizations at the phrase and short sentence level in more than 50% of opportunities given min cues. SLP Short Term Goal 4 (Week 1): Pt will provide orientation information with >75% accuracy given min cues and supportive materials (calendar, etc). SLP Short Term Goal 5 (Week 1): Pt will maintain attention to structured tasks for >3 minutes given min cues.  Skilled Therapeutic Interventions: Skilled therapy session focused on communication and cognitive goals. SLP facilitated session by prompting patient to complete automatic speech tasks. When given initial day/month, patient was able to complete remainder of tasks independently. To target expressive language tasks, SLP completed confrontational naming task. Patient with 85% accuracy independently, a significant increase since evaluation. Patient able to verbalize life PTA, job, and family independently, however with more difficulty when it came to generational naming (naming items she grows in the garden). When targeting receptive language, patient with 100% accuracy during simple yes/no, 80% accuracy during complex yes/no and 33% accuracy during paragraph yes/no. This is a significant improvement from evaluation, where patient was approximately 50% accurate. During orientation task, patient independently oriented to self, situation, location, and time. Patient left in bed with alarm set and visitors present.  Continue POC.   Pain None reported  Therapy/Group: Individual Therapy  Kimberley Speece M.A., CCC-SLP 10/16/2023, 7:43 AM

## 2023-10-17 DIAGNOSIS — I6340 Cerebral infarction due to embolism of unspecified cerebral artery: Secondary | ICD-10-CM | POA: Diagnosis not present

## 2023-10-17 LAB — GLUCOSE, CAPILLARY
Glucose-Capillary: 96 mg/dL (ref 70–99)
Glucose-Capillary: 122 mg/dL — ABNORMAL HIGH (ref 70–99)

## 2023-10-17 NOTE — Progress Notes (Addendum)
 PROGRESS NOTE   Subjective/Complaints:  No issues overnite , discussed bed transfers with PT and well as pt and daughter   ROS: negative chest pain, shortness of breath, abd pain, nausea, vomiting, diarrhea positive constipation-?improved  Objective:   No results found.  Recent Labs    10/16/23 0536  WBC 7.0  HGB 13.1  HCT 39.2  PLT 255   Recent Labs    10/16/23 0536  NA 139  K 4.1  CL 106  CO2 26  GLUCOSE 102*  BUN 13  CREATININE 0.79  CALCIUM 9.1    Intake/Output Summary (Last 24 hours) at 10/17/2023 0836 Last data filed at 10/16/2023 1700 Gross per 24 hour  Intake 240 ml  Output --  Net 240 ml        Physical Exam: Vital Signs Blood pressure 131/88, pulse 65, temperature 97.8 F (36.6 C), resp. rate 16, weight 48.1 kg, SpO2 97%.   General: No acute distress.  Resting comfortably in bed. Mood and affect are appropriate Heart: Regular rate and rhythm no rubs murmurs or extra sounds Lungs: Clear to auscultation, breathing unlabored, no rales or wheezes Abdomen: Positive bowel sounds, soft nontender to palpation, nondistended Extremities: No clubbing, cyanosis, or edema Skin: No evidence of breakdown, no evidence of rash over exposed surfaces  PRIOR EXAMS: Neurologic:  Awake, alert, oriented to self and time.  Moderate attention and memory deficits. Cranial nerves II through XII intact, motor strength is 5-/5 in bilateral deltoid, bicep, tricep, grip, hip flexor, knee extensors, ankle dorsiflexor and plantar flexor Sensory exam normal sensation to light touch  in bilateral upper and lower extremities but patient loses attention during task and is difficult to assess Cerebellar exam normal finger to nose to finger  in bilateral upper and lower extremities Musculoskeletal: Full range of motion in all 4 extremities. No joint swelling    Assessment/Plan: 1. Functional deficits which require 3+ hours  per day of interdisciplinary therapy in a comprehensive inpatient rehab setting. Physiatrist is providing close team supervision and 24 hour management of active medical problems listed below. Physiatrist and rehab team continue to assess barriers to discharge/monitor patient progress toward functional and medical goals  Care Tool:  Bathing    Body parts bathed by patient: Right arm, Left arm, Chest, Abdomen, Front perineal area, Right upper leg, Left upper leg, Face   Body parts bathed by helper: Buttocks, Left lower leg, Right lower leg     Bathing assist Assist Level: Minimal Assistance - Patient > 75%     Upper Body Dressing/Undressing Upper body dressing   What is the patient wearing?: Pull over shirt    Upper body assist Assist Level: Minimal Assistance - Patient > 75%    Lower Body Dressing/Undressing Lower body dressing      What is the patient wearing?: Incontinence brief, Pants     Lower body assist Assist for lower body dressing: Moderate Assistance - Patient 50 - 74%     Toileting Toileting    Toileting assist Assist for toileting: Moderate Assistance - Patient 50 - 74%     Transfers Chair/bed transfer  Transfers assist     Chair/bed transfer assist level: Contact  Guard/Touching assist     Locomotion Ambulation   Ambulation assist      Assist level: Minimal Assistance - Patient > 75% Assistive device: No Device Max distance: 150   Walk 10 feet activity   Assist     Assist level: Minimal Assistance - Patient > 75% Assistive device: No Device   Walk 50 feet activity   Assist    Assist level: Minimal Assistance - Patient > 75% Assistive device: No Device    Walk 150 feet activity   Assist    Assist level: Minimal Assistance - Patient > 75% Assistive device: No Device    Walk 10 feet on uneven surface  activity   Assist     Assist level: Minimal Assistance - Patient > 75% Assistive device: Other (comment) (no  device.)   Wheelchair     Assist Is the patient using a wheelchair?: No             Wheelchair 50 feet with 2 turns activity    Assist            Wheelchair 150 feet activity     Assist          Blood pressure 131/88, pulse 65, temperature 97.8 F (36.6 C), resp. rate 16, weight 48.1 kg, SpO2 97%.  Medical Problem List and Plan: 1. 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  Antithrombotics: -DVT/anticoagulation:  Pharmaceutical: Lovenox 40mg  daily -antiplatelet therapy: Aspirin and Plavix for three weeks followed by aspirin alone   3. Pain Management: Tylenol and robaxin as needed   4. Mood/Behavior/Sleep: LCSW to evaluate and provide emotional support             -antipsychotic agents: n/a   5. Neuropsych/cognition: This patient is not capable of making decisions on her own behalf. -  Notable Hx ADHD and mild cognitive impairment - per family deficits are currently worse than baseline -3-29: At home, takes Adderall 20 mg twice daily; resume 10 mg daily in AM for concentration and memory  6. Skin/Wound Care: Routine skin care checks   -DC peripheral IV 7. Fluids/Electrolytes/Nutrition: strict Is and Os and follow-up chemistries             -start carb modified diet             -continue B12 weekly for history of deficiency   -Admission labs stable 8: Hypertension: monitor TID and prn (home hydrochlorothiazide 25 mg held)             -continue losartan 12.5 mg daily             -continue metoprolol succ 25 mg daily  -10/15/23 BPs stable, continue    Vitals:   10/13/23 1432 10/13/23 1921 10/14/23 0448 10/14/23 1351  BP: 91/63 118/87 126/82 99/62   10/14/23 1928 10/15/23 0507 10/15/23 1329 10/15/23 1913  BP: (!) 105/57 108/74 106/70 116/68   10/16/23 0457 10/16/23 1341  10/16/23 1919 10/17/23 0505  BP: 120/75 117/74 116/69 131/88     9: Hyperlipidemia: continue atorvastatin 40mg  daily, Zetia--zetia not reordered, weekday team to assess if this is still needed?  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  BP soft, no room to add Entresto. Add low-dose losartan 12.5 mg daily.  Can add SGLT2 and spironolactone as outpatient              -daily weight             -maintain K>4, mag>2              -follow-up [facility] as outpt -3-29: Weight slightly up, however euvolemic appearance, no respiratory distress.  Monitor. Stable 10/15/23  Filed Weights   10/15/23 0500 10/16/23 0500 10/17/23 0505  Weight: 47.2 kg 48.7 kg 48.1 kg    12: DM: on metformin 1000 mg BID at home>>not restarted; A1c = 6.2%             -monitor CBGs BID             -not requiring insulin coverage>>discontinue SSI  3/31  CBG (last 3)  Recent Labs    10/16/23 0621 10/16/23 1638 10/17/23 0504  GLUCAP 111* 124* 96      13: History of mild cognitive impairment-- see #5   14: Hypokalemia: given supplement- resolved     Latest Ref Rng & Units 10/16/2023    5:36 AM 10/13/2023    6:54 AM 10/12/2023    6:28 AM  BMP  Glucose 70 - 99 mg/dL 161  096  045   BUN 8 - 23 mg/dL 13  13  13    Creatinine 0.44 - 1.00 mg/dL 4.09  8.11  9.14   Sodium 135 - 145 mmol/L 139  137  135   Potassium 3.5 - 5.1 mmol/L 4.1  4.0  3.4   Chloride 98 - 111 mmol/L 106  103  101   CO2 22 - 32 mmol/L 26  23  23    Calcium 8.9 - 10.3 mg/dL 9.1  9.0  9.2       15: Mild thrombocytopenia: follow-up CBC--stable   16: Constipation: prefers prunes/fruits/veg -10/15/23 LBM this morning per pt but not documented since 3/28; monitor    LOS: 5 days A FACE TO FACE EVALUATION WAS PERFORMED  Erick Colace 10/17/2023, 8:36 AM

## 2023-10-17 NOTE — Progress Notes (Signed)
 Speech Language Pathology Daily Session Note  Patient Details  Name: Gabrielle Vargas MRN: [id number] Date of Birth: [birth year]  Today's Date: 10/17/2023 SLP Individual Time: 1001-1059 SLP Individual Time Calculation (min): 58 min  Short Term Goals: Week 1: SLP Short Term Goal 1 (Week 1): Pt will name common objects and their functions with >75% accuracy given min cues. SLP Short Term Goal 2 (Week 1): Pt will follow 1-2 step directions with >75% accuracy given min cues. SLP Short Term Goal 3 (Week 1): Pt will produce sponatenous verbalizations at the phrase and short sentence level in more than 50% of opportunities given min cues. SLP Short Term Goal 4 (Week 1): Pt will provide orientation information with >75% accuracy given min cues and supportive materials (calendar, etc). SLP Short Term Goal 5 (Week 1): Pt will maintain attention to structured tasks for >3 minutes given min cues.  Skilled Therapeutic Interventions: Skilled therapy session focused on cognitive and communication goals. Given patients significant improvement in language, portions of a standardized assessment was utilized to evaluate patients higher level language and cognitive skills. Patient independently oriented to self, situation, location (except city) and date. Patient with significant difficulties during immediate recall of words and numbers due to poor sustained attention. SLP attempted to reduce environmental distractions (turning off TV, closing door), though this was unsuccessul in increasing patients attention to task. During receptive language tasks, patient able to follow up to 3 step commands and name 18/25 abtract objects given extra processing time, repetition of instructions and occasional phonemic/semantic cues. Patient with significant difficulties during verbal problem solving tasks, though likely due to receptive language deficits and poor sustainted attention. Patient left in bed with alarm set and call  bell in reach. Continue POC.    Pain None reported   Therapy/Group: Individual Therapy  Lee-Ann Gal M.A., CCC-SLP 10/17/2023, 7:44 AM

## 2023-10-17 NOTE — Progress Notes (Signed)
 Occupational Therapy Session Note  Patient Details  Name: Gabrielle Vargas MRN: [id number] Date of Birth: 1950-09-30  Today's Date: 10/17/2023 OT Individual Time: 8295-6213 OT Individual Time Calculation (min): 40 min  Session 2 OT Individual Time: 1400-1445 OT Individual Time Calculation (min): 45 min   Short Term Goals: Week 1:  OT Short Term Goal 1 (Week 1): STG = LTG (d/t ELOS)  Skilled Therapeutic Interventions/Progress Updates:     AM Session: Pt received sitting up in bed with husband present in room. Pt presenting to be in good spirits receptive to skilled OT session reporting 0/10 pain- OT offering intermittent rest breaks, repositioning, and therapeutic support to optimize participation in therapy session. Pt dressed for the day politely declining need for shower and toileting this session. Focused this session on functional cognition, BADL retraining, and activity tolerance. Pt transitioned to EOB with supervision and donned shoes sitting EOB with supervision. Engaged Pt in completing grooming/hygiene task in standing position at sink for increased balance and endurance challenge. Pt able to locate 3/3 toiletry items sitting on L side of sink with min questioning cues and brush teeth and groom hair with CGA provided for balance +increased time and min verbal cues for attention to task. Pt ambulated to therapy gym no AD with CGA +increased time with min verbal cues provided for safety with Pt easily distracted by external stimuli during functional mobility and unaware of obstacles in her environment. Engaged Pt in completing seated picture replication task using medium sized pegs with picture and pegs located in Pt's L visual field to facilitate visual scanning to L. Pt requiring max questioning cues to maintain attention to task, to initiate next step, and for problem solving during task. Pt easily distracted by external stimuli and presenting with delayed processing time during  activity. Pt completed functional mobility back to room CGA no AD. Pt was left resting in wc with call bell in reach, husband present in room, and all needs met.    PM Session:  Pt received lightly sleeping in bed waking upon OT arrival. Pt presenting to be in good spirits receptive to skilled OT session reporting 0/10 pain- OT offering intermittent rest breaks, repositioning, and therapeutic support to optimize participation in therapy session. Focus this session on L side attention, activity tolerance, and dual tasking. Pt transitioned to EOB supervision and donned shoes EOB with set-up assist. Engaged Pt in completing functional mobility to therapy gym no AD with CGA provided for balance +verbal cues for attention to task and environment as Pt becomes easily distracted by external stimuli. Conducted therapy session in quiet gym with minimal distractions to support improved attention. Engaged Pt in completing series of activities on BITS system to address functional cognition (attention, awareness, task initiation, sequencing) deficits and facilitate visual scanning to L.  -Visual scanning > single target/user paced: Pt tasked with maintaining standing balance while utilizing B UEs to locate and touch moving stimuli on screen. Pt able to complete activity with 90% accuracy with reaction time of 4.37 sec with 27 hits completed in 2 minutes. Mod verbal cues required for attention +increased time to locate initiate touching stimuli.  -Bell cancellation: Pt tasked with maintaining standing balance while visually scanning to locate bells on screen from a collage with distractor objects present. Pt required mod verbal cues for attention and task initiation +significantly increased amount of time (9:34) with Pt able to locate 17/35 bells with 18 misses total.  Pt completed functional mobility back to room CGA no  AD. Pt was left resting in bed with call bell in reach, bed alarm on, and all needs met.    Therapy  Documentation Precautions:  Precautions Precautions: Fall Recall of Precautions/Restrictions: Impaired Precaution/Restrictions Comments: Slight L neglect Restrictions Weight Bearing Restrictions Per Provider Order: No   Therapy/Group: Individual Therapy  Clide Deutscher 10/17/2023, 7:54 AM

## 2023-10-17 NOTE — Progress Notes (Signed)
 Physical Therapy Session Note  Patient Details  Name: Gabrielle Vargas MRN: [id number] Date of Birth: [birth year]  Today's Date: 10/16/2023 PT Individual Time: 9562-1308 PT Individual Time Calculation (min): 70 min   Short Term Goals: Week 1:  PT Short Term Goal 1 (Week 1): STG = LTG 2/2 ELOS  Skilled Therapeutic Interventions/Progress Updates:  Patient supine in bed on entrance to room. Patient alert and agreeable to PT session. Dtr present.   Patient with no pain complaint at start of session. Pt taken outside for change in mood as pt slightly depressed in bed. Dtr relates a "down day". Pt requests dtr to join session.   Therapeutic Activity: Bed Mobility: Pt performed supine > sit with supervision/ Mod I. Extra time required for balance.  Transfers: Pt performed sit<>stand and stand pivot transfers throughout session from various seat heights and surfaces. Provided vc/ tc for forward lean and use of hands to push off.  Taken to entrance of [facility] in w/c for energy conservation and time.   Gait Training:  Pt ambulated >600 ft using no AD with intermittent HHA from dtr. Able to ambulate down ramped entry on concrete surface, up four concrete steps using no HR or phsyical assist and back up ramp with minimal brief standing rest breaks to look at blooming flowers. Demonstrated good balance throughout with minimal forward lean and quick but slower pace than previous week.   Pt then guided in lateral monster-walks over 40 ft in each direction. Pt provided with vc and visual cues prior to performance and then requires multimodal cues for technique throughout. Facilitate pelvic positioning as pt turns into direction of travel throughout. Also guided in backward stepping and high knee stepping. Is very visually distracted and requires vc throughout in order to regain attn to task.   Husband joins group outside and following seated rest break, is able to provide CGA to gait belt on pt in order to  complete return trip to unit. Husband and pt perform well with good technique. Pt completes distance>800' back to room from patio on [facility].   Patient supine at end of session with brakes locked, bed alarm set, and all needs within reach.   Therapy Documentation Precautions:  Precautions Precautions: Fall Recall of Precautions/Restrictions: Impaired Precaution/Restrictions Comments: Slight L neglect Restrictions Weight Bearing Restrictions Per Provider Order: No  Pain:  No pain related or demonstrated this session.   Therapy/Group: Individual Therapy  Loel Dubonnet PT, DPT, CSRS 10/16/2023, 12:41 PM

## 2023-10-17 NOTE — Progress Notes (Signed)
 Physical Therapy Session Note  Patient Details  Name: Gabrielle Vargas MRN: [id number] Date of Birth: July 22, 1950  Today's Date: 10/17/2023 PT Individual Time: 0807-0852 PT Individual Time Calculation (min): 45 min   Short Term Goals: Week 1:  PT Short Term Goal 1 (Week 1): STG = LTG 2/2 ELOS  Skilled Therapeutic Interventions/Progress Updates:  Patient supine in bed on entrance to room. Patient alert and agreeable to PT session. Dtr present.  Patient with no pain complaint at start of session.  RN present and has provided morning medications. Pt holds medications for long time turning small cup of meds as though she is going to dump them out on the tray table. Pt does not initiate taking meds with cues from dtr or RN. RN provides info on medications. Pt dumps on tray table and pushes them around. Vc for pt to take them according to size. Pt starts to take meds and requires vc for attn to task.   Pt performed supine > sit with Mod I/ supervision. She dons socks with supervision and shoes with light MinA for holding backs of shoes up. She is able to ambulate to sink and perform tooth brushing and washing of face. Is able to change shirt with MinA for LUE sequencing.   Husband has provided height measurement for bed height at home at 34". Hospital bed flattened and elevate to 34". Pt is able to leap into bed onto flexed LLE and then roll to L side to enter bed. Then slides out to standing to exit on pt's L side.  Performs x2 with no difficulty. Continue to encourage lowering bed height. Family relates they can provide her with a step to improve ability to enter bed.   Patient supine in bed at end of session with brakes locked, bed alarm set, and all needs within reach. Discussed need to perform stairs and floor transfer in next session with PT.    Therapy Documentation Precautions:  Precautions Precautions: Fall Recall of Precautions/Restrictions: Impaired Precaution/Restrictions  Comments: Slight L neglect Restrictions Weight Bearing Restrictions Per Provider Order: No  Pain:  No pain related this session.    Therapy/Group: Individual Therapy  Loel Dubonnet PT, DPT, CSRS 10/17/2023, 11:15 AM

## 2023-10-18 DIAGNOSIS — I6340 Cerebral infarction due to embolism of unspecified cerebral artery: Secondary | ICD-10-CM | POA: Diagnosis not present

## 2023-10-18 LAB — GLUCOSE, CAPILLARY
Glucose-Capillary: 93 mg/dL (ref 70–99)
Glucose-Capillary: 107 mg/dL — ABNORMAL HIGH (ref 70–99)

## 2023-10-18 MED ORDER — ADULT MULTIVITAMIN W/MINERALS CH
1.0000 | ORAL_TABLET | Freq: Every day | ORAL | Status: DC
  Administered 2023-10-19 – 2023-10-20 (×2): 1 via ORAL
  Filled 2023-10-18 (×2): qty 1

## 2023-10-18 NOTE — Discharge Instructions (Signed)
 Inpatient Rehab Discharge Instructions  Gabrielle Vargas Discharge date and time: 10/20/2023   Activities/Precautions/ Functional Status: Activity: no lifting, driving, or strenuous exercise until cleared by MD Diet: diabetic diet Wound Care: none needed Functional status:  ___ No restrictions     ___ Walk up steps independently ___ 24/7 supervision/assistance   ___ Walk up steps with assistance _x__ Intermittent supervision/assistance  ___ Bathe/dress independently ___ Walk with walker     ___ Bathe/dress with assistance ___ Walk Independently    ___ Shower independently ___ Walk with assistance    __x_ Shower with assistance _x__ No alcohol     ___ Return to work/school ________  Special Instructions: No driving, alcohol consumption or tobacco use.  COMMUNITY REFERRALS UPON DISCHARGE:    Outpatient: PT    OT     SP             Agency: [facility] Phone: [phone]              Appointment Date/Time:WILL CALL HUSBAND TO SET UP FOLLOW UP APPOINTMENTS  Medical Equipment/Items Ordered:TUB SEAT                                                 Agency/Supplier:[facility]  [phone]  STROKE/TIA DISCHARGE INSTRUCTIONS SMOKING Cigarette smoking nearly doubles your risk of having a stroke & is the single most alterable risk factor  If you smoke or have smoked in the last 12 months, you are advised to quit smoking for your health. Most of the excess cardiovascular risk related to smoking disappears within a year of stopping. Ask you doctor about anti-smoking medications [facility] Quit Line: 1-800-QUIT NOW Free Smoking Cessation Classes [phone]  CHOLESTEROL Know your levels; limit fat & cholesterol in your diet  Lipid Panel     Component Value Date/Time   CHOL 102 10/09/2023 0712   TRIG 87 10/09/2023 0712   HDL 52 10/09/2023 0712   CHOLHDL 2.0 10/09/2023 0712   VLDL 17 10/09/2023 0712   LDLCALC 33 10/09/2023 0712   LDLCALC 110 (H) 02/05/2020 0857     Many  patients benefit from treatment even if their cholesterol is at goal. Goal: Total Cholesterol (CHOL) less than 160 Goal:  Triglycerides (TRIG) less than 150 Goal:  HDL greater than 40 Goal:  LDL (LDLCALC) less than 100   BLOOD PRESSURE American Stroke Association blood pressure target is less that 120/80 mm/Hg  Your discharge blood pressure is:  BP: 115/67 Monitor your blood pressure Limit your salt and alcohol intake Many individuals will require more than one medication for high blood pressure  DIABETES (A1c is a blood sugar average for last 3 months) Goal HGBA1c is under 7% (HBGA1c is blood sugar average for last 3 months)  Diabetes: No known diagnosis of diabetes    Lab Results  Component Value Date   HGBA1C 6.2 09/05/2023    Your HGBA1c can be lowered with medications, healthy diet, and exercise. Check your blood sugar as directed by your physician Call your physician if you experience unexplained or low blood sugars.  PHYSICAL ACTIVITY/REHABILITATION Goal is 30 minutes at least 4 days per week  Activity: Increase activity slowly, Therapies: Physical Therapy: Outpatient, Occupational Therapy: Outpatient, and Speech Therapy: Outpatient Return to work: when cleared by MD Activity decreases your risk of heart attack and stroke and makes your  heart stronger.  It helps control your weight and blood pressure; helps you relax and can improve your mood. Participate in a regular exercise program. Talk with your doctor about the best form of exercise for you (dancing, walking, swimming, cycling).  DIET/WEIGHT Goal is to maintain a healthy weight  Your discharge diet is:  Diet Order             Diet Carb Modified Fluid consistency: Thin; Room service appropriate? Yes  Diet effective now                  thin liquids Your height is:    Your current weight is: Weight: 46.8 kg Your Body Mass Index (BMI) is:  BMI (Calculated): 20.83 Following the type of diet specifically designed for  you will help prevent another stroke. Your goal weight range is:   Your goal Body Mass Index (BMI) is 19-24. Healthy food habits can help reduce 3 risk factors for stroke:  High cholesterol, hypertension, and excess weight.  RESOURCES Stroke/Support Group:  Call [phone]   STROKE EDUCATION PROVIDED/REVIEWED AND GIVEN TO PATIENT Stroke warning signs and symptoms How to activate emergency medical system (call 911). Medications prescribed at discharge. Need for follow-up after discharge. Personal risk factors for stroke. Pneumonia vaccine given: No Flu vaccine given: No My questions have been answered, the writing is legible, and I understand these instructions.  I will adhere to these goals & educational materials that have been provided to me after my discharge from the hospital.     My questions have been answered and I understand these instructions. I will adhere to these goals and the provided educational materials after my discharge from the hospital.  Patient/Caregiver Signature _______________________________ Date __________  Clinician Signature _______________________________________ Date __________  Please bring this form and your medication list with you to all your follow-up doctor's appointments.

## 2023-10-18 NOTE — Progress Notes (Signed)
 Speech Language Pathology Daily Session Note  Patient Details  Name: Gabrielle Vargas MRN: [id number] Date of Birth: 1950/10/02  Today's Date: 10/18/2023 SLP Individual Time: 1100-1159 SLP Individual Time Calculation (min): 59 min  Short Term Goals: Week 1: SLP Short Term Goal 1 (Week 1): Pt will name common objects and their functions with >75% accuracy given min cues. SLP Short Term Goal 2 (Week 1): Pt will follow 1-2 step directions with >75% accuracy given min cues. SLP Short Term Goal 3 (Week 1): Pt will produce sponatenous verbalizations at the phrase and short sentence level in more than 50% of opportunities given min cues. SLP Short Term Goal 4 (Week 1): Pt will provide orientation information with >75% accuracy given min cues and supportive materials (calendar, etc). SLP Short Term Goal 5 (Week 1): Pt will maintain attention to structured tasks for >3 minutes given min cues.  Skilled Therapeutic Interventions: Skilled therapy session focused on communication goals. To address expressive language, SLP educated patient on utilizing "description" strategy during moments of word finding difficulty. Patient required mod-maxA to describe vocabulary words presented by SLP and minA to complete responsive naming task. SLP then targeted receptive language through mildly complex yes/no. Patient with 90% accuracy independently. Patient continues to present with decreased sustained attention requiring complete elimination of external distractions during completion of task. At the end of the session, patient continent of bladder. Patient left in bed with alarm set and call bell in reach. Continue POC.    Pain None reported   Therapy/Group: Individual Therapy  Amaria Mundorf M.A., CCC-SLP 10/18/2023, 7:45 AM

## 2023-10-18 NOTE — Progress Notes (Signed)
 Physical Therapy Session Note  Patient Details  Name: Gabrielle Vargas MRN: [id number] Date of Birth: Sep 19, 1950  Today's Date: 10/18/2023 PT Individual Time: 1335-1445 PT Individual Time Calculation (min): 70 min   Short Term Goals: Week 1:  PT Short Term Goal 1 (Week 1): STG = LTG 2/2 ELOS  Skilled Therapeutic Interventions/Progress Updates:  Patient supine in bed on entrance to room. Patient alert and agreeable to PT session. Husband and twin sister present.   Patient with no pain complaint at start of session.  Therapeutic Activity: Bed Mobility: Pt performed supine <> sit with IND. No cueing required.  Transfers: Pt performed sit<>stand and stand pivot transfers throughout session with IND/ supervision. No cueing required for technique.  Gait Training:  Pt ambulated >200 ft using no AD with supervision/ IND. Demonstrated significantly improved quality of gait since initial eval. Provided vc/ tc for attn to task.   6 Min Walk Test:  Instructed patient to ambulate as quickly and as safely as possible for 6 minutes using LRAD. Patient was allowed to take standing rest breaks without stopping the test, but if the patient required a sitting rest break the clock would be stopped and the test would be over.  Results: 866 feet (Avg speed 0.8 m/s) using no AD with IND/ sup. Results indicate that the patient has reduced endurance with ambulation compared to age matched norms.  Age Matched Norms: 36-69 yo M: 25 F: 62, 3-79 yo M: 64 F: 471, 60-89 yo M: 417 F: 392 MDC: 58.21 meters (190.98 feet) or 50 meters (ANPTA Core Set of Outcome Measures for Adults with Neurologic Conditions, 2018)   Neuromuscular Re-ed: NMR facilitated during session with focus on standing balance and dynamic gait. Pt guided in re-assessment of Berg Balance Test.   Patient demonstrates reduced fall risk as noted by score of  54 /56 on Berg Balance Scale.  (<36= high risk for falls, close to 100%; 37-45  significant >80%; 46-51 moderate >50%; 52-55 lower >25%) Pt has difficulty with forward reach but has also had cervical fixation surgery in past that may limit her ability to perform since she is able to easily pick up item from floor.   Patient demonstrates low fall risk as noted by score of 27/30 on  Functional Gait Assessment.   <22/30 = predictive of falls, <20/30 = fall in 6 months, <18/30 = predictive of falls in PD MCID: 5 points stroke population, 4 points geriatric population (ANPTA Core Set of Outcome Measures for Adults with Neurologic Conditions, 2018)   NMR performed for improvements in motor control and coordination, balance, sequencing, judgement, and self confidence/ efficacy in performing all aspects of mobility at highest level of independence.   Patient supine in bed at end of session with brakes locked, bed alarm set, and all needs within reach.   Therapy Documentation Precautions:  Precautions Precautions: Fall Recall of Precautions/Restrictions: Impaired Precaution/Restrictions Comments: Slight L neglect Restrictions Weight Bearing Restrictions Per Provider Order: No  Pain: Pain Assessment Pain Scale: 0-10 Pain Score: 0-No pain related this session  Therapy/Group: Individual Therapy  Loel Dubonnet PT, DPT, CSRS 10/18/2023, 12:49 PM

## 2023-10-18 NOTE — Discharge Summary (Signed)
 Physician Discharge Summary  Patient ID: Gabrielle Vargas MRN: [id number] DOB/AGE: 03/06/1951 73 y.o.  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 Hypertension Hyperlipidemia Hypothyroidism Acute combined heart failure History of mild cognitive impairment Hypokalemia Mild thrombocytopenia Constipation  Discharged Condition: stable  Significant Diagnostic Studies: none  Labs:  Basic Metabolic Panel: Recent Labs  Lab 10/16/23 0536  NA 139  K 4.1  CL 106  CO2 26  GLUCOSE 102*  BUN 13  CREATININE 0.79  CALCIUM 9.1    CBC: Recent Labs  Lab 10/16/23 0536  WBC 7.0  HGB 13.1  HCT 39.2  MCV 83.9  PLT 255    CBG: Recent Labs  Lab 10/18/23 1631 10/19/23 0554 10/19/23 1711 10/19/23 2156 10/20/23 0638  GLUCAP 107* 98 128* 119* 98    Brief HPI:   Gabrielle Vargas is a 73 y.o. female  who presented to [facility] ED on 10/08/2023 with sudden onset of left facial droop and left-sided weakness. Code stroke activated. 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 No significant proximal stenosis, aneurysm, or branch vessel occlusion within the Circle of Willis. Minimal atherosclerotic change at the proximal left ICA without significant stenosis. Aortic atherosclerosis. Incidentally imaged spine findings on CTA: Cervical fusion at C4-5 and C5-6. Slight degenerative anterolisthesis at C2-3 and C3-4.  EKG: Sinus rhythm; Atrial premature complex; Nonspecific T abnormalities, lateral leads. Labs: Na 133, K 2.8, glucose 110, ionized calcium 1.08, LFTs normal, BUN and Cr normal, CBC normal, U/A normal, UDS positive for amphetamine (on Adderall at home).   Received TNK at ~1800 hours. Neurology consulted and started on  aspirin and Plavix. MRI: Multiple punctate acute infarcts bilateral frontal and left parietal lobes.   2D Echo EF 30 to 35%.  LV with grade 1 diastolic dysfunction. LDL 80. Hgb A1c 6.2%. Cardiology consulted. Per cardiology assessment: Acute combined heart failure: Echocardiogram shows LVEF 30 to 35% with regional wall motion abnormalities.  Could represent CAD versus Takotsubo's variant as presented with acute CVA. Discussed with Dr. Pearlean Brownie, she is outside the window for permissive hypertension and okay to start GDMT.  Started Toprol-XL 25 mg daily.  BP soft, no room to add Entresto.  Will add low-dose losartan 12.5 mg daily.  Can add SGLT2 and spironolactone as outpatient. Recommend repeat echocardiogram as outpatient in next 3 months.  If EF remains reduced, would plan ischemic evaluation. Currently euvolemic, no diuresis recommended. Now on aspirin 81 mg and Plavix 75 mg for 3 weeks, then aspirin alone.  Discussed with cardiology no need for anticoagulation at this time. Loop recorder to be placed today, 3/27   EEG suggestive of moderate diffuse encephalopathy. No seizures or epileptiform discharges were seen throughout the recording. Tolerating regular diet with thin liquids. Overall she requires mod A for UB and LB ADL, min A for transfers and +2 safety for mobility with HHA.    Hospital Course: Gabrielle Vargas was admitted to rehab 10/12/2023 for inpatient therapies to consist of PT, ST and OT at least three hours five days a week. Past admission physiatrist, therapy team and rehab RN have worked together to provide customized collaborative inpatient rehab.  Follow-up labs performed 3/28 with normal serum potassium and normal CBC with differential. Restarted home Adderrall at lower dose (10 mg every  day) to avoid possible elevated BP and tachycardia.   Blood pressures were monitored on TID basis and losartan 12.5 mg daily and metoprolol succ 25 mg daily continued.   History of elevated A1c  without diagnosis of DM. glucose has been monitored with ac/hs CBG checks and SSI discontinued. Home metformin held.  Rehab course: During patient's stay in rehab weekly team conferences were held to monitor patient's progress, set goals and discuss barriers to discharge. At admission, patient required min with mobility and min with basic self-care skills.  She  has had improvement in activity tolerance, balance, postural control as well as ability to compensate for deficits. She has had improvement in functional use RUE/LUE  and RLE/LLE as well as improvement in awareness.  Patient to discharge at overall Modified Independent;Mod;Min level. Patient has met 10 of 10 long term goals due to improved activity tolerance, improved balance, postural control, ability to compensate for deficits,  improved attention, improved awareness, and improved coordination.  Patient to discharge at overall Supervision level.  Patient's care partner is independent to provide the necessary physical and cognitive assistance at discharge.    Outpatient PT, OT and SLP arranged at [facility].  Discharge disposition: 01-Home or Self Care     Diet: heart healthy/carb modified  Special Instructions: No driving, alcohol consumption or tobacco use.  Discharge Instructions     Ambulatory referral to Neurology   Complete by: As directed    An appointment is requested in approximately: 4 weeks   Ambulatory referral to Physical Medicine Rehab   Complete by: As directed    Hospital follow-up      Allergies as of 10/20/2023       Reactions   Ativan [lorazepam]    Agitation/paradoxical reaction.   Oxycodone-acetaminophen Nausea Only   Latex Hives, Rash        Medication List     STOP taking these medications    amphetamine-dextroamphetamine 20 MG tablet Commonly known as: ADDERALL Replaced by: amphetamine-dextroamphetamine 10 MG tablet   benzonatate 200 MG capsule Commonly known as: TESSALON    hydrochlorothiazide 25 MG tablet Commonly known as: HYDRODIURIL   metFORMIN 500 MG tablet Commonly known as: GLUCOPHAGE   senna-docusate 8.6-50 MG tablet Commonly known as: Senokot-S   simvastatin 20 MG tablet Commonly known as: ZOCOR       TAKE these medications    amphetamine-dextroamphetamine 10 MG tablet Commonly known as: ADDERALL Take 1 tablet (10 mg total) by mouth daily with breakfast. This dose is decreased as before admission. Replaces: amphetamine-dextroamphetamine 20 MG tablet   Aspirin Low Dose 81 MG chewable tablet Generic drug: aspirin Chew 1 tablet (81 mg total) by mouth daily.   atorvastatin 40 MG tablet Commonly known as: LIPITOR Take 1 tablet (40 mg total) by mouth daily.   clopidogrel 75 MG tablet Commonly known as: PLAVIX Take 1 tablet (75 mg total) by mouth daily for 11 days. Start taking on: October 21, 2023   cyanocobalamin 1000 MCG tablet Commonly known as: VITAMIN B12 Take 1,000 mcg by mouth 2 (two) times a week.   D3-50 PO Take 1 capsule by mouth daily.   Evening Primrose Oil 1000 MG Caps Take 1,000 mg by mouth in the morning and at bedtime.   ezetimibe 10 MG tablet Commonly known as: ZETIA TAKE 1 TABLET BY MOUTH DAILY What changed: when to take this   losartan 25 MG tablet Commonly known as: COZAAR Take 0.5 tablets (12.5 mg total) by mouth daily.  metoprolol succinate 25 MG 24 hr tablet Commonly known as: TOPROL-XL Take 1 tablet (25 mg total) by mouth daily.   NAC 500 MG Caps Generic drug: Acetylcysteine Take 500 mg by mouth daily.   QUERCETIN COMPLEX IMMUNE PO Take 500 mg by mouth daily.   Synthroid 75 MCG tablet Generic drug: levothyroxine TAKE 1 TABLET BY MOUTH DAILY   vitamin C 1000 MG tablet Take 1,000 mg by mouth daily.   Zinc 20 MG Caps Take 20 mg by mouth daily.        Follow-up Information     Laurann Montana, PA-C Follow up.   Specialties: Cardiology, Radiology Why: Hospital follow-up with Cardiology  scheduled for 11/09/2023 at 10:55am. Please arrive 15 minutes early for check-in. If this date/ time does not work for you, please call our office to reschedule. Contact information: [address] [place] [address] [phone]         Shirline Frees, NP Follow up.   Specialty: Family Medicine Why: Call the office on Monday to make arrangements for hosptial follow-up. Contact information: [address] [place] [address] [phone]         [facility] Follow up.   Why: Call the office on Monday to make arrangements for hosptial follow-up. Contact information: [address]     [address] [place] [address] [phone]        Erick Colace, MD Follow up.   Specialty: Physical Medicine and Rehabilitation Why: office will call you to arrange your appt (sent) Contact information: [address] [place] [address] [phone]                 Signed: Milinda Antis, PA-C 10/20/2023, 8:52 AM

## 2023-10-18 NOTE — Progress Notes (Signed)
 PROGRESS NOTE   Subjective/Complaints:  Twin sister and husband at bedside , OT session in progress in room  No new c/os , mean length utterance improving   ROS: negative chest pain, shortness of breath, abd pain, or nausea, vomiting,  Objective:   No results found.  Recent Labs    10/16/23 0536  WBC 7.0  HGB 13.1  HCT 39.2  PLT 255   Recent Labs    10/16/23 0536  NA 139  K 4.1  CL 106  CO2 26  GLUCOSE 102*  BUN 13  CREATININE 0.79  CALCIUM 9.1    Intake/Output Summary (Last 24 hours) at 10/18/2023 0853 Last data filed at 10/17/2023 1850 Gross per 24 hour  Intake 480 ml  Output --  Net 480 ml        Physical Exam: Vital Signs Blood pressure 125/78, pulse 63, temperature 98.1 F (36.7 C), resp. rate 18, weight 50.2 kg, SpO2 95%.   General: No acute distress.  Resting comfortably in bed. Mood and affect are appropriate Heart: Regular rate and rhythm no rubs murmurs or extra sounds Lungs: Clear to auscultation, breathing unlabored, no rales or wheezes Abdomen: Positive bowel sounds, soft nontender to palpation, nondistended Extremities: No clubbing, cyanosis, or edema Skin: No evidence of breakdown, no evidence of rash over exposed surfaces  PRIOR EXAMS: Neurologic:  Awake, alert, oriented to self and time.  Moderate attention and memory deficits. Cranial nerves II through XII intact, motor strength is 5-/5 in bilateral deltoid, bicep, tricep, grip, hip flexor, knee extensors, ankle dorsiflexor and plantar flexor Sensory exam normal sensation to light touch  in bilateral upper and lower extremities but patient loses attention during task and is difficult to assess Cerebellar exam normal finger to nose to finger  in bilateral upper and lower extremities Musculoskeletal: Full range of motion in all 4 extremities. No joint swelling    Assessment/Plan: 1. Functional deficits which require 3+ hours per  day of interdisciplinary therapy in a comprehensive inpatient rehab setting. Physiatrist is providing close team supervision and 24 hour management of active medical problems listed below. Physiatrist and rehab team continue to assess barriers to discharge/monitor patient progress toward functional and medical goals  Care Tool:  Bathing    Body parts bathed by patient: Right arm, Left arm, Chest, Abdomen, Front perineal area, Right upper leg, Left upper leg, Face   Body parts bathed by helper: Buttocks, Left lower leg, Right lower leg     Bathing assist Assist Level: Minimal Assistance - Patient > 75%     Upper Body Dressing/Undressing Upper body dressing   What is the patient wearing?: Pull over shirt    Upper body assist Assist Level: Minimal Assistance - Patient > 75%    Lower Body Dressing/Undressing Lower body dressing      What is the patient wearing?: Incontinence brief, Pants     Lower body assist Assist for lower body dressing: Moderate Assistance - Patient 50 - 74%     Toileting Toileting    Toileting assist Assist for toileting: Moderate Assistance - Patient 50 - 74%     Transfers Chair/bed transfer  Transfers assist  Chair/bed transfer assist level: Contact Guard/Touching assist     Locomotion Ambulation   Ambulation assist      Assist level: Minimal Assistance - Patient > 75% Assistive device: No Device Max distance: 150   Walk 10 feet activity   Assist     Assist level: Minimal Assistance - Patient > 75% Assistive device: No Device   Walk 50 feet activity   Assist    Assist level: Minimal Assistance - Patient > 75% Assistive device: No Device    Walk 150 feet activity   Assist    Assist level: Minimal Assistance - Patient > 75% Assistive device: No Device    Walk 10 feet on uneven surface  activity   Assist     Assist level: Minimal Assistance - Patient > 75% Assistive device: Other (comment) (no device.)    Wheelchair     Assist Is the patient using a wheelchair?: No             Wheelchair 50 feet with 2 turns activity    Assist            Wheelchair 150 feet activity     Assist          Blood pressure 125/78, pulse 63, temperature 98.1 F (36.7 C), resp. rate 18, weight 50.2 kg, SpO2 95%.  Medical Problem List and Plan: 1. 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 Formulized individual treatment plan based on medical history, underlying problem and comorbidities.    2.  Antithrombotics: -DVT/anticoagulation:  Pharmaceutical: Lovenox 40mg  daily -antiplatelet therapy: Aspirin and Plavix for three weeks followed by aspirin alone   3. Pain Management: Tylenol and robaxin as needed   4. Mood/Behavior/Sleep: LCSW to evaluate and provide emotional support             -antipsychotic agents: n/a   5. Neuropsych/cognition: This patient is not capable of making decisions on her own behalf. -  Notable Hx ADHD and mild cognitive impairment - per family deficits are currently worse than baseline -3-29: At home, takes Adderall 20 mg twice daily; resume 10 mg daily in AM for concentration and memory  6. Skin/Wound Care: Routine skin care checks   -DC peripheral IV 7. Fluids/Electrolytes/Nutrition: strict Is and Os and follow-up chemistries             -start carb modified diet             -continue B12 weekly for history of deficiency   -Admission labs stable 8: Hypertension: monitor TID and prn (home hydrochlorothiazide 25 mg held)             -continue losartan 12.5 mg daily             -continue metoprolol succ 25 mg daily  -10/15/23 BPs stable, continue     Vitals:   10/14/23 1351 10/14/23 1928 10/15/23 0507 10/15/23 1329  BP: 99/62 (!) 105/57 108/74 106/70   10/15/23 1913 10/16/23 0457 10/16/23 1341 10/16/23 1919  BP: 116/68 120/75 117/74 116/69   10/17/23 0505 10/17/23 1311  10/17/23 1949 10/18/23 0424  BP: 131/88 105/76 101/62 125/78     9: Hyperlipidemia: continue atorvastatin 40mg  daily, Zetia--zetia not reordered, weekday team to assess if this is still needed?  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  BP soft, no room to add Entresto. Add low-dose losartan 12.5 mg daily.  Can add SGLT2 and spironolactone as outpatient              -daily weight             -maintain K>4, mag>2              -follow-up [facility] as outpt -3-29: Weight slightly up, however euvolemic appearance, no respiratory distress.  Monitor. Stable 10/15/23  Filed Weights   10/16/23 0500 10/17/23 0505 10/18/23 0424  Weight: 48.7 kg 48.1 kg 50.2 kg    12: DM: on metformin 1000 mg BID at home>>not restarted; A1c = 6.2%             -monitor CBGs BID             -not requiring insulin coverage>>discontinue SSI  3/31  CBG (last 3)  Recent Labs    10/17/23 0504 10/17/23 1636 10/18/23 0603  GLUCAP 96 122* 93      13: History of mild cognitive impairment-- see #5   14: Hypokalemia: given supplement- resolved     Latest Ref Rng & Units 10/16/2023    5:36 AM 10/13/2023    6:54 AM 10/12/2023    6:28 AM  BMP  Glucose 70 - 99 mg/dL 409  811  914   BUN 8 - 23 mg/dL 13  13  13    Creatinine 0.44 - 1.00 mg/dL 7.82  9.56  2.13   Sodium 135 - 145 mmol/L 139  137  135   Potassium 3.5 - 5.1 mmol/L 4.1  4.0  3.4   Chloride 98 - 111 mmol/L 106  103  101   CO2 22 - 32 mmol/L 26  23  23    Calcium 8.9 - 10.3 mg/dL 9.1  9.0  9.2       15: Mild thrombocytopenia: follow-up CBC--stable   16: Constipation: prefers prunes/fruits/veg -10/15/23 LBM  this morning per pt but not documented since 3/28; monitor    LOS: 6 days A FACE TO FACE EVALUATION WAS PERFORMED  Erick Colace 10/18/2023, 8:53 AM

## 2023-10-18 NOTE — Progress Notes (Signed)
 Physical Therapy Discharge Summary  Patient Details  Name: Gabrielle Vargas MRN: [id number] Date of Birth: 1951/02/22  Date of Discharge from PT service:October 19, 2023  Today's Date: 10/19/2023   Patient has met 11 of 11 long term goals due to improved balance, increased strength, improved attention, improved awareness, and improved coordination.  Patient to discharge at an ambulatory level Supervision.   Patient's care partner is independent to provide the necessary physical and cognitive assistance at discharge.  Reasons goals not met: n/a  Recommendation:  Patient will benefit from ongoing skilled PT services in outpatient setting to continue to advance safe functional mobility, address ongoing impairments in strength, coordination, balance, activity tolerance, cognition, safety awareness, and to minimize fall risk.  Equipment: No equipment provided  Reasons for discharge: treatment goals met  Patient/family agrees with progress made and goals achieved: Yes  PT Discharge Precautions/Restrictions Precautions Precautions: Fall Precaution/Restrictions Comments: cognitive/ memory deficits Restrictions Weight Bearing Restrictions Per Provider Order: No Vital Signs   Pain Pain Assessment Pain Scale: 0-10 Pain Score: 0-No pain Pain Interference Pain Interference Pain Effect on Sleep: 1. Rarely or not at all Pain Interference with Therapy Activities: 1. Rarely or not at all Pain Interference with Day-to-Day Activities: 1. Rarely or not at all Vision/Perception  Vision - History Ability to See in Adequate Light: 0 Adequate Perception Perception: Impaired Preception Impairment Details: Inattention/Neglect Perception-Other Comments: very mild and significantly improved from eval, very distractible Praxis Praxis: Impaired Praxis Impairment Details: Initiation;Motor planning Praxis-Other Comments: very mild and significantly improved from eval  Cognition Overall Cognitive  Status: History of cognitive impairments - at baseline (recent STM deficits) Arousal/Alertness: Awake/alert Orientation Level: Oriented X4 (can vary between A&O x4 and A&O x3) Attention: Sustained;Alternating Sustained Attention: Impaired Alternating Attention: Impaired Memory: Impaired Memory Impairment: Retrieval deficit;Decreased recall of new information;Decreased short term memory Awareness: Appears intact Safety/Judgment: Appears intact Sensation Sensation Light Touch: Appears Intact Coordination Gross Motor Movements are Fluid and Coordinated: Yes Fine Motor Movements are Fluid and Coordinated: Yes Coordination and Movement Description: Significant improvement from initial eval Heel Shin Test: able to complete R > L. Motor  Motor Motor: Within Functional Limits  Mobility Bed Mobility Bed Mobility: Rolling Left;Supine to Sit;Sit to Supine Rolling Left: Independent Supine to Sit: Independent Sit to Supine: Independent Transfers Transfers: Sit to Stand;Stand to [employer] Transfers Sit to Stand: Independent Stand to Sit: Independent Stand Pivot Transfers: Supervision/Verbal cueing Transfer (Assistive device): None Locomotion  Gait Ambulation: Yes Gait Assistance: Supervision/Verbal cueing;Independent Gait Distance (Feet): 850 Feet Assistive device: None Gait Gait: Yes Gait Pattern: Within Functional Limits Gait velocity: 0.8 m/s - Insurance account manager / Additional Locomotion Stairs: Yes Stairs Assistance: Independent;Independent with assistive device Stair Management Technique: No rails;One rail Right Number of Stairs: 22 Height of Stairs: 7 Curb: Independent Naval architect Mobility: No  Trunk/Postural Assessment  Cervical Assessment Cervical Assessment: Exceptions to [facility] (slight forward head - easily correctable) Thoracic Assessment Thoracic Assessment: Exceptions to [facility] (rounded shoulders) Lumbar Assessment Lumbar Assessment:  Within Functional Limits Postural Control Postural Control: Deficits on evaluation Righting Reactions: slow but adequate  Balance Balance Balance Assessed: Yes Standardized Balance Assessment Standardized Balance Assessment: Berg Balance Test;Functional Gait Assessment ( = 866 ft) Berg Balance Test Sit to Stand: Able to stand without using hands and stabilize independently Standing Unsupported: Able to stand safely 2 minutes Sitting with Back Unsupported but Feet Supported on Floor or Stool: Able to sit safely and securely 2 minutes Stand to Sit: Sits safely with  minimal use of hands Transfers: Able to transfer safely, minor use of hands Standing Unsupported with Eyes Closed: Able to stand 10 seconds safely Standing Ubsupported with Feet Together: Able to place feet together independently and stand 1 minute safely From Standing, Reach Forward with Outstretched Arm: Can reach forward >12 cm safely (5") From Standing Position, Pick up Object from Floor: Able to pick up shoe safely and easily From Standing Position, Turn to Look Behind Over each Shoulder: Looks behind from both sides and weight shifts well Turn 360 Degrees: Able to turn 360 degrees safely in 4 seconds or less Standing Unsupported, Alternately Place Feet on Step/Stool: Able to stand independently and safely and complete 8 steps in 20 seconds Standing Unsupported, One Foot in Front: Able to place foot tandem independently and hold 30 seconds Standing on One Leg: Able to lift leg independently and hold 5-10 seconds Total Score: 54 Static Sitting Balance Static Sitting - Balance Support: No upper extremity supported;Feet supported Static Sitting - Level of Assistance: 7: Independent Dynamic Sitting Balance Dynamic Sitting - Balance Support: Feet supported;No upper extremity supported Dynamic Sitting - Level of Assistance: 7: Independent Static Standing Balance Static Standing - Balance Support: During functional  activity Static Standing - Level of Assistance: 6: Modified independent (Device/Increase time) Dynamic Standing Balance Dynamic Standing - Balance Support: During functional activity;No upper extremity supported Dynamic Standing - Level of Assistance: 6: Modified independent (Device/Increase time) Functional Gait  Assessment Gait assessed : Yes Gait Level Surface: Walks 20 ft in less than 5.5 sec, no assistive devices, good speed, no evidence for imbalance, normal gait pattern, deviates no more than 6 in outside of the 12 in walkway width. Change in Gait Speed: Able to smoothly change walking speed without loss of balance or gait deviation. Deviate no more than 6 in outside of the 12 in walkway width. Gait with Horizontal Head Turns: Performs head turns smoothly with no change in gait. Deviates no more than 6 in outside 12 in walkway width Gait with Vertical Head Turns: Performs task with slight change in gait velocity (eg, minor disruption to smooth gait path), deviates 6 - 10 in outside 12 in walkway width or uses assistive device Gait and Pivot Turn: Pivot turns safely in greater than 3 sec and stops with no loss of balance, or pivot turns safely within 3 sec and stops with mild imbalance, requires small steps to catch balance. Step Over Obstacle: Is able to step over 2 stacked shoe boxes taped together (9 in total height) without changing gait speed. No evidence of imbalance. Gait with Narrow Base of Support: Ambulates 7-9 steps. Gait with Eyes Closed: Walks 20 ft, no assistive devices, good speed, no evidence of imbalance, normal gait pattern, deviates no more than 6 in outside 12 in walkway width. Ambulates 20 ft in less than 7 sec. Ambulating Backwards: Walks 20 ft, no assistive devices, good speed, no evidence for imbalance, normal gait Steps: Alternating feet, no rail. Total Score: 27 FGA comment:: low fall risk Extremity Assessment      RLE Assessment RLE Assessment: Within  Functional Limits General Strength Comments: grossly 5/5 LLE Assessment LLE Assessment: Within Functional Limits General Strength Comments: grossly 5/5   Loel Dubonnet PT, DPT, CSRS 10/18/2023, 5:20 PM

## 2023-10-18 NOTE — Progress Notes (Signed)
 Patient ID: Gabrielle Vargas, female   DOB: October 10, 1950, 73 y.o.   MRN: [id number]  Met with pt, sister and husband to give team conference update regarding goals of supervision level due to cognitive deficits. Husband to be here tomorrow for education and plan for discharge 4/4. Discussed OP therapies and they live close to [place], will work on this no equipment needs.

## 2023-10-18 NOTE — Progress Notes (Signed)
 Occupational Therapy Session Note  Patient Details  Name: Gabrielle Vargas MRN: [id number] Date of Birth: 1950/08/26  Today's Date: 10/18/2023 OT Individual Time: 0850-1000 OT Individual Time Calculation (min): 70 min    Short Term Goals: Week 1:  OT Short Term Goal 1 (Week 1): STG = LTG (d/t ELOS)  Skilled Therapeutic Interventions/Progress Updates:     Pt received sitting up in bed with husband and sister present in room. Pt presenting to be in good spirits receptive to skilled OT session reporting 0/10 pain- OT offering intermittent rest breaks, repositioning, and therapeutic support to optimize participation in therapy session. Pt dressed and ready for the day, politely declining need for BADLs this session. Focused this session on functional cognition, activity tolerance, and d/c planning.   Began session by engaging Pt and Pt's husband in d/c planning discussion with emphasis on home set-up and accessibility with education provided on fall prevention, energy conservation, bathroom safety, DME options, and simple home modifications to increase Pt's safety. Determined shower seat with back on it would be the safest DME option to utilize in shower to increase Pt's safety. Pt's husband and sister reporting they will be able to provide 24/7 supervision at d/c and will be available to drive Pt to OPOT appointments.   Pt completed functional mobility to ADL apartment >150 ft no AD with close supervision, no LOB noted Pt easily distracted by objects and people in her environment and unaware of obstacles which is a big safety consideration. Engaged Pt in completing functional cognition simple meal preparation activity to work on functional cognition, attention, sequencing, and problem solving skills. Pt tasked with preparing simple snack including naming foods and utensils needed to make meal, sequencing through steps to prepare meal, and cleaning up space following. Pt requiring maximal multimodal  cues for sequencing, attention, and awareness to environment. Recommending Pt receive assistance for all meal preparation activities.  Engaged Pt in completing trail making Part A assessment on BITs system in standing position to work on dual tasking, sequencing, visual scanning, and attention. Pt tasked with sequencing numbers 1-25 in order by drawing line from one number to the next, visually scanning L<>R to locate numbers. Pt able to complete task with 100% accuracy in 9 minutes with consistent questioning cues required to initiate locating next number, to attended to task, and to recall instructions for activity.   Engaged Pt in completing the following dynamic exercises to work on dynamic balance, activity tolerance, and B U/LE strength for improved participation in BADLs: -Sit > stand with overhead press using 4.4# weighted ball 1x10 reps  -Internal/external shoulder rotation ball passes behind the back clockwise/counterclockwise using 4.4# weighted ball 1x10 reps  Internal/external shoulder rotation ball passes behind the head clockwise/counterclockwise using 4.4# weighted ball 1x10 reps  -Chest press in standing using 4.4# weighted ball 1x10 reps  -B UE shoulder flexion using 4.4# weighted ball 1x10 reps  OT provided a visual model of each exercise and provided consistent verbal cues and demonstration of movement to support attention to task.   Pt ambulated back to room no AD close supervision. Pt was left resting in bed with call bell in reach, husband and sister present in room, and all needs met.    Therapy Documentation Precautions:  Precautions Precautions: Fall Recall of Precautions/Restrictions: Impaired Precaution/Restrictions Comments: Slight L neglect Restrictions Weight Bearing Restrictions Per Provider Order: No  Therapy/Group: Individual Therapy  Clide Deutscher 10/18/2023, 7:56 AM

## 2023-10-19 ENCOUNTER — Other Ambulatory Visit (HOSPITAL_COMMUNITY): Payer: Self-pay

## 2023-10-19 DIAGNOSIS — I6340 Cerebral infarction due to embolism of unspecified cerebral artery: Secondary | ICD-10-CM | POA: Diagnosis not present

## 2023-10-19 LAB — GLUCOSE, CAPILLARY
Glucose-Capillary: 98 mg/dL (ref 70–99)
Glucose-Capillary: 128 mg/dL — ABNORMAL HIGH (ref 70–99)
Glucose-Capillary: 119 mg/dL — ABNORMAL HIGH (ref 70–99)

## 2023-10-19 MED ORDER — ATORVASTATIN CALCIUM 40 MG PO TABS
40.0000 mg | ORAL_TABLET | Freq: Every day | ORAL | 0 refills | Status: DC
  Filled 2023-10-19: qty 30, 30d supply, fill #0

## 2023-10-19 MED ORDER — METOPROLOL SUCCINATE ER 25 MG PO TB24
25.0000 mg | ORAL_TABLET | Freq: Every day | ORAL | 0 refills | Status: DC
  Filled 2023-10-19: qty 30, 30d supply, fill #0

## 2023-10-19 MED ORDER — ASPIRIN 81 MG PO CHEW
81.0000 mg | CHEWABLE_TABLET | Freq: Every day | ORAL | 0 refills | Status: DC
  Filled 2023-10-19: qty 30, 30d supply, fill #0
  Filled 2023-10-19: qty 100, 100d supply, fill #0

## 2023-10-19 MED ORDER — LOSARTAN POTASSIUM 25 MG PO TABS
12.5000 mg | ORAL_TABLET | Freq: Every day | ORAL | 0 refills | Status: DC
  Filled 2023-10-19: qty 15, 30d supply, fill #0

## 2023-10-19 MED ORDER — AMPHETAMINE-DEXTROAMPHETAMINE 10 MG PO TABS: 10.0000 mg | ORAL_TABLET | Freq: Every day | ORAL | Status: DC

## 2023-10-19 MED ORDER — CLOPIDOGREL BISULFATE 75 MG PO TABS
75.0000 mg | ORAL_TABLET | Freq: Every day | ORAL | 0 refills | Status: AC
  Filled 2023-10-19: qty 11, 11d supply, fill #0

## 2023-10-19 NOTE — Progress Notes (Signed)
 Occupational Therapy Discharge Summary  Patient Details  Name: Gabrielle Vargas MRN: [id number] Date of Birth: [birth year]  Date of Discharge from OT service:October 19, 2023  Today's Date: 10/19/2023 OT Individual Time: 1191-4782 OT Individual Time Calculation (min): 43 min    Patient has met 10 of 10 long term goals due to improved activity tolerance, improved balance, postural control, ability to compensate for deficits,  improved attention, improved awareness, and improved coordination.  Patient to discharge at overall Supervision level.  Patient's care partner is independent to provide the necessary physical and cognitive assistance at discharge.    Reasons goals not met: All goals met  Recommendation:  Patient will benefit from ongoing skilled OT services in outpatient setting to continue to advance functional skills in the area of BADL, iADL, and Reduce care partner burden.  Equipment: Information systems manager with back  Reasons for discharge: treatment goals met and discharge from hospital  Patient/family agrees with progress made and goals achieved: Yes  OT Discharge Skilled Therapeutic Intervention/Progress Updates:  Pt received sitting up in bed with husband present in room. Pt presenting to be in good spirits receptive to skilled OT session reporting 0/10 pain- OT offering intermittent rest breaks, repositioning, and therapeutic support to optimize participation in therapy session. Focused this session on BADL retraining and family education. Provided education on CVA etiology/recovery process, impact of CVA on Pt's functional/cognitive status, DME recommendations for shower seat with back, follow-up therapy recommendations of OPOT, need for strict 24/7 supervision, and need to have assistance with higher level IADLs. Provided education on how to safely assist Pt with functional transfers and how to cue Pt to optimize Pt's independence with Pt's husband demonstrating tech back as evidence of  learning during toileting, bathing, dressing, and grooming tasks. Pt able to complete BADLs at supervision level overall with verbal cues required for attention, to continue task, task initiation, and increased time for processing speed. Pt was left resting in bed with call bell in reach, husband present in room, and all needs met.   Precautions/Restrictions  Precautions Precautions: Fall Precaution/Restrictions Comments: cognitive/ memory deficits Restrictions Weight Bearing Restrictions Per Provider Order: No Pain Pain Assessment Pain Scale: 0-10 Pain Score: 0-No pain ADL ADL Eating: Independent Where Assessed-Eating: Chair Grooming: Supervision/safety Where Assessed-Grooming: Standing at sink, Sitting at sink Upper Body Bathing: Supervision/safety Where Assessed-Upper Body Bathing: Shower Lower Body Bathing: Supervision/safety Where Assessed-Lower Body Bathing: Shower Upper Body Dressing: Supervision/safety Where Assessed-Upper Body Dressing: Chair Lower Body Dressing: Supervision/safety Where Assessed-Lower Body Dressing: Chair Toileting: Supervision/safety Where Assessed-Toileting: Teacher, adult education: Distant supervision Statistician Method: Proofreader: Engineer, technical sales: Not assessed Film/video editor: Distant supervision Film/video editor Method: Designer, industrial/product: [employer], Information systems manager with back Vision Baseline Vision/History: 1 Wears glasses Patient Visual Report: No change from baseline Vision Assessment?: No apparent visual deficits Eye Alignment: Within Functional Limits Ocular Range of Motion: Within Functional Limits Alignment/Gaze Preference: Within Defined Limits Saccades: Within functional limits Convergence: Within functional limits Visual Fields: No apparent deficits Perception  Perception: Impaired Perception-Other Comments: mild L inattention with significant improvement from  eval Praxis Praxis: Impaired Praxis Impairment Details: Initiation;Motor planning Praxis-Other Comments: very mild with significant improvement from eval Cognition Cognition Overall Cognitive Status: Impaired/Different from baseline Arousal/Alertness: Awake/alert Orientation Level: Person;Place;Situation Person: Oriented Place: Oriented Situation: Oriented Memory: Impaired Memory Impairment: Retrieval deficit;Decreased recall of new information;Decreased short term memory Decreased Short Term Memory: Verbal basic;Functional basic Attention: Sustained;Alternating Sustained Attention: Impaired Sustained Attention Impairment:  Verbal basic;Functional basic Awareness: Appears intact Problem Solving: Impaired (difficul to assess d/t receptive language deficits) Safety/Judgment: Appears intact Brief Interview for Mental Status (BIMS) Repetition of Three Words (First Attempt): 3 Temporal Orientation: Year: Correct Temporal Orientation: Month: Accurate within 5 days Temporal Orientation: Day: Correct Recall: "Sock": No, could not recall Recall: "Blue": Yes, no cue required Recall: "Bed": Yes, no cue required BIMS Summary Score: 13 Sensation Sensation Light Touch: Appears Intact Hot/Cold: Appears Intact Proprioception: Appears Intact Stereognosis: Appears Intact Coordination Gross Motor Movements are Fluid and Coordinated: Yes Fine Motor Movements are Fluid and Coordinated: Yes Coordination and Movement Description: Significant improvement from initial eval Finger Nose Finger Test: Slow but able to complete Motor  Motor Motor: Within Functional Limits Mobility  Bed Mobility Bed Mobility: Rolling Left;Supine to Sit;Sit to Supine Rolling Left: Independent Supine to Sit: Independent Sit to Supine: Independent Transfers Sit to Stand: Independent Stand to Sit: Independent  Trunk/Postural Assessment  Cervical Assessment Cervical Assessment: Within Functional Limits Thoracic  Assessment Thoracic Assessment: Within Functional Limits Lumbar Assessment Lumbar Assessment: Within Functional Limits Postural Control Postural Control: Deficits on evaluation Righting Reactions: slow but adequate  Balance Balance Balance Assessed: Yes Static Sitting Balance Static Sitting - Balance Support: No upper extremity supported;Feet supported Static Sitting - Level of Assistance: 7: Independent Dynamic Sitting Balance Dynamic Sitting - Balance Support: Feet supported;No upper extremity supported Dynamic Sitting - Level of Assistance: 7: Independent Static Standing Balance Static Standing - Balance Support: During functional activity Static Standing - Level of Assistance: 6: Modified independent (Device/Increase time) Dynamic Standing Balance Dynamic Standing - Balance Support: During functional activity;No upper extremity supported Dynamic Standing - Level of Assistance: 6: Modified independent (Device/Increase time) Extremity/Trunk Assessment RUE Assessment RUE Assessment: Within Functional Limits Active Range of Motion (AROM) Comments: A/ROM WFL In all ranges General Strength Comments: 5/5  strength throughout LUE Assessment LUE Assessment: Within Functional Limits Active Range of Motion (AROM) Comments: WFL in all ranges. General Strength Comments: 5/5 strength throughout   Clide Deutscher 10/19/2023, 12:22 PM

## 2023-10-19 NOTE — Progress Notes (Incomplete)
 Inpatient Rehabilitation Care Coordinator Discharge Note   Patient Details  Name: Gabrielle Vargas MRN: [id number] Date of Birth: January 17, 1951   Discharge location: HOME WITH HUSBAND  WHO IS ABLE TO PROVIDE 24/7 SUPERVISION  Length of Stay: 8 days  Discharge activity level: SUPERVISION LEVEL  Home/community participation: ACTIVE  Patient response WU:[id number] Literacy - How often do you need to have someone help you when you read instructions, pamphlets, or other written material from your doctor or pharmacy?: Never  Patient response YN:WGNFAO Isolation - How often do you feel lonely or isolated from those around you?: Never  Services provided included: MD, RD, PT, OT, SLP, RN, CM, Pharmacy, SW  Financial Services:  Financial Services Utilized: Private Insurance HEALTH TEAM ADVANTAGE  Choices offered to/list presented to: PT AND HUSBAND  Follow-up services arranged:  Outpatient, DME, Patient/Family has no preference for HH/DME agencies    Outpatient Servicies: [facility]  OT  SP  WILL CALL TO SCHEDULE FOLLOW UP APPOINTMENTS DME : [facility]    Patient response to transportation need: Is the patient able to respond to transportation needs?: Yes In the past 12 months, has lack of transportation kept you from medical appointments or from getting medications?: No In the past 12 months, has lack of transportation kept you from meetings, work, or from getting things needed for daily living?: No   Patient/Family verbalized understanding of follow-up arrangements:  Yes  Individual responsible for coordination of the follow-up plan: Gabrielle Vargas [phone]  Confirmed correct DME delivered: Gabrielle Vargas 10/19/2023    Comments (or additional information):PT DID WELL AND HUSBAND WAS HERE DAILY AND PARTICIPATED.   Summary of Stay    Date/Time Discharge Planning CSW  10/18/23 [phone] HOme with husband who can provide 24/7 supervision, he is here  daily. Await team's recommendations RGD       Marlei Glomski, Lemar Livings

## 2023-10-19 NOTE — Progress Notes (Signed)
 Speech Language Pathology Discharge Summary  Patient Details  Name: Gabrielle Vargas MRN: [id number] Date of Birth: July 02, 1951  Date of Discharge from SLP service:October 19, 2023  Today's Date: 10/19/2023 SLP Individual Time: 0902-1000 SLP Individual Time Calculation (min): 58 min   Skilled Therapeutic Interventions:   Skilled therapy session focused on cognitive and communication goals. SLP facilitated session by providing mod i A for orientation to self, situation, location, and time. SLP continued to target attention and problem solving through safety awareness scenarios. Patient required min-modA to sustain attention to task and correctly identify problem and solution. SLP targeted communication goals through receptive and expressive language tasks. Patient independently followed two step directions and recalled 19/20 names of functional objects around the room. Patient left in bed with alarm set and call bell in reach. Continue POC.     Patient has met 4 of 6 long term goals.  Patient to discharge at overall Modified Independent;Mod;Min level.  Reasons goals not met: cont to require modA for attention and complex receptive language   Clinical Impression/Discharge Summary: Pt has made great gains and has met 4 of 6 LTG's this admission due to improved cognition and language. Pt is currently an overall mod I A for orientation tasks, though requires up to [facility] for attention. Patient requires mod I A for expressive language, though continues to require occasional min-modA for complex receptive language tasks.  Pt/family education complete and pt will discharge home with 24 hour supervision from friends/family/etc. Pt would benefit from OP f/u ST services to maximize cognition and communication in order to maximize functional independence.   Care Partner:  Caregiver Able to Provide Assistance: Yes  Type of Caregiver Assistance: Cognitive;Physical  Recommendation:  24 hour  supervision/assistance;Outpatient SLP  Rationale for SLP Follow Up: Maximize cognitive function and independence;Maximize functional communication   Equipment: n/a   Reasons for discharge: Discharged from hospital   Patient/Family Agrees with Progress Made and Goals Achieved: Yes    Cymone Yeske M.A., CCC-SLP 10/19/2023, 10:19 AM

## 2023-10-19 NOTE — Plan of Care (Signed)
  Problem: RH Comprehension Communication Goal: LTG Patient will comprehend basic/complex auditory (SLP) Description: LTG: Patient will comprehend basic/complex auditory information with cues (SLP). Outcome: Not Met (cont to require up to modA)   Problem: RH Attention Goal: LTG Patient will demonstrate this level of attention during functional activites (SLP) Description: LTG:  Patient will will demonstrate this level of attention during functional activites (SLP) Outcome: Not Met (cont to require up to modA)   Problem: RH Cognition - SLP Goal: RH LTG Patient will demonstrate orientation with cues Description:  LTG:  Patient will demonstrate orientation to person/place/time/situation with cues (SLP)   Outcome: Completed/Met   Problem: RH Expression Communication Goal: LTG Patient will express needs/wants via multi-modal(SLP) Description: LTG:  Patient will express needs/wants via multi-modal communication (gestures/written, etc) with cues (SLP) Outcome: Completed/Met Goal: LTG Patient will increase word finding of common (SLP) Description: LTG:  Patient will increase word finding of common objects/daily info/abstract thoughts with cues using compensatory strategies (SLP). Outcome: Completed/Met   Problem: RH Memory Goal: LTG Patient will follow step by step directions w/cues (SLP) Description: LTG: Patient will follow step by step directions with cues (SLP). Outcome: Completed/Met

## 2023-10-19 NOTE — Progress Notes (Signed)
 Physical Therapy Session Note  Patient Details  Name: Gabrielle Vargas MRN: [id number] Date of Birth: 17-May-1951  Today's Date: 10/19/2023 PT Individual Time: 4098-1191 PT Individual Time Calculation (min): 41 min   Skilled Therapeutic Interventions/Progress Updates: Patient supine in bed on entrance to room. Patient alert and agreeable to PT session.   Patient reported feeling prepared for d/c home. Family and pt with no further concerns. PTA asked pt what her hobbies are that she would like to return to with pt reporting desire to garden again.   Therapeutic Activity: Bed Mobility: Pt performed supine<sit on EOB with modI.   - Pt ambulated from room<>main gym with supervision. Pt navigated around non-compliant surface (mat) with instructions to pick up pong balls in cups (weeds), and to place them along the side. Pt then cued to remove old dirt (cup that had ball in it) and replace with new dirt (pillow case) in order to pack in flowers (cones). Pt performed task with overall supervision and required VC to recall sequence and what each object was and where to place (mod cuing). Pt demonstrated ability to kneel down with one knee onto mat to place flowers, and to flex at hips to pick up weeds all with no LOB and supervision. Pt and family instructed that if pt were to go back to her garden since it is spring, to ensure she is supervised for safety and due to cognition presentation.   Patient sitting in WC at end of session with brakes locked, family present and all needs within reach.      Therapy Documentation Precautions:  Precautions Precautions: Fall Recall of Precautions/Restrictions: Impaired Precaution/Restrictions Comments: cognitive/ memory deficits Restrictions Weight Bearing Restrictions Per Provider Order: No  Therapy/Group: Individual Therapy  Charlisa Cham PTA 10/19/2023, 4:21 PM

## 2023-10-19 NOTE — Patient Care Conference (Addendum)
 Inpatient RehabilitationTeam Conference and Plan of Care Update Date: 10/18/2023   Time: 10:41 AM    [name]      Medical Record Number: [id number]  Date of Birth: 1950-12-24 Sex: Female         Room/Bed: 4W02C/4W02C-01 Payor Info: Payor: [employer]na [employer] ADVANTAGE / Plan: [employer] PPO / Product Type: *No Product type* /    Admit Date/Time:  10/12/2023  7:22 PM  Primary Diagnosis:  CVA (cerebral vascular accident) [facility])  Hospital Problems: Principal Problem:   CVA (cerebral vascular accident) [facility])    Expected Discharge Date: Expected Discharge Date: 10/20/23  Team Members Present: Physician leading conference: Dr. Claudette Laws Social Worker Present: Dossie Der, LCSW Nurse Present: Chana Bode, RN PT Present: Ralph Leyden, PT OT Present: Mariann Barter, OT SLP Present: Everardo Pacific, SLP PPS Coordinator present : Fae Pippin, SLP     Current Status/Progress Goal Weekly Team Focus  Bowel/Bladder   Pt continent of b/b   Remain continent   Assist with toileting qshift and prn    Swallow/Nutrition/ Hydration               ADL's   supervision UB BADLs, Supervision-CGA LB BADLs and toileting, ambualtory transfers no AD supervision-CGA // Barriers: functional cognition, decreased insight into deficits, family presenting wiht decreased ingiht and awareness into Pt's deficits   supervision   functional congition, BADL retraining, IADL retraining, activity tolerance, safety awareness, dynamic balance    Mobility   Bed mobility = IND, transfers = supervision, ambulation = supervision/ CGA   overall supervision  Barriers: cognitive impairment/ memory, reduced attention /// Work on: dual tasking, cognition, coordination and upper level balance, family ed with husband    Communication   mod   min   mildly complex yes/no, following directions    Safety/Cognition/ Behavioral Observations  mod-max   min   sustained attention,  memory, processing time    Pain   No c/o pain   Remain pain free   Assess qshift and prn    Skin   Skin intact   Maintain skin integrity  Assess qshift and prn      Discharge Planning:  HOme with husband who can provide 24/7 supervision, he is here daily. Await team's recommendations   Team Discussion: Patient post CVA with cognitive deficits, slow processing speed, poor attention to tasks, and posterior language deficits although expressive language has improved. Working on Insurance account manager of high level receptive deficits and external distraction with history of ADHD.   Patient on target to meet rehab goals: Yes, currently needs supervision for ADLs and CGA for toileting/hygiene.  Goals for discharge set for supervision overall.  *See Care Plan and progress notes for long and short-term goals.   Revisions to Treatment Plan:  N/a   Teaching Needs: Safety, medications, transfers, toileting, etc.   Current Barriers to Discharge: Decreased caregiver support  Possible Resolutions to Barriers: Family education OP follow up services DME: Shower chair with a back    Medical Summary Current Status: Speech improving, still with balance issues.  Barriers to Discharge: Other (comments)  Barriers to Discharge Comments: Aphasia limits participation Possible Resolutions to [employer] Focus: Speech and safety awareness, family training   Continued Need for Acute Rehabilitation Level of Care: The patient requires daily medical management by a physician with specialized training in physical medicine and rehabilitation for the following reasons: Direction of a multidisciplinary physical rehabilitation program to maximize functional independence : Yes Medical management of patient stability  for increased activity during participation in an intensive rehabilitation regime.: Yes Analysis of laboratory values and/or radiology reports with any subsequent need for medication adjustment  and/or medical intervention. : Yes   I attest that I was present, lead the team conference, and concur with the assessment and plan of the team.   Chana Bode B 10/19/2023, 12:03 PM

## 2023-10-19 NOTE — Progress Notes (Signed)
 Physical Therapy Session Note  Patient Details  Name: Gabrielle Vargas MRN: [id number] Date of Birth: 03-15-1951  Today's Date: 10/19/2023 PT Individual Time: 0805-0900 PT Individual Time Calculation (min): 55 min   Short Term Goals: Week 1:  PT Short Term Goal 1 (Week 1): STG = LTG 2/2 ELOS  Skilled Therapeutic Interventions/Progress Updates: Pt presented in bed with husband present agreeable to therapy. Pt denies pain throughout session. Session focused on dynamic balance ,endurance, and dual task activities. Pt required min cues for initiation of transferring OOB however done mod I. Pt stood and doffed pajama pants with supervision and demonstrated insight by returning to sitting and donning pants. Pt also donned shoes mod I. Pt then ambulated to day room and participated in dynamic balance activities including toe taps/lunges to target incorporating increased balance strategies with pt performing cross steps. Pt with no LOB but required increased time to recall and local color when asked. Pt then ambulated to main gym performing dual task activity ambulating while throwing ball in air. Pt required intermittent cues to sustain task as pt would become distracted and stop throwing ball while walking. In gym completed floor transfer mod I. Pt was able to lower self to floor as well as push up from quadruped position without cues. Pt then carried ball to rebounder and participated in activity while naming different states. Pt able to recall x 4 states while throwing ball without pause however after that would need to stop to recall name of state. Pt then ambulated back to day room with PTA throwing ball to pt with no LOB and more attention to task. Back in day room pt participated in ball toss while standing on [employer]. Pt noted at times to shift weight posteriorly requiring cues to avoid edge of Airex. Pt completed session ambulating to 4C for general endurance without rest break. Pt returned to room at  end of session and returned to bed with husband and MD present for daily assessment with needs met.      Therapy Documentation Precautions:  Precautions Precautions: Fall Recall of Precautions/Restrictions: Impaired Precaution/Restrictions Comments: cognitive/ memory deficits Restrictions Weight Bearing Restrictions Per Provider Order: No General:   Vital Signs:   Pain: Pain Assessment Pain Scale: 0-10 Pain Score: 0-No pain Mobility: Bed Mobility Bed Mobility: Rolling Left;Supine to Sit;Sit to Supine Rolling Left: Independent Supine to Sit: Independent Sit to Supine: Independent Transfers Transfers: Sit to Stand;Stand to Sit;Stand Pivot Transfers Sit to Stand: Independent Stand to Sit: Independent Stand Pivot Transfers: Supervision/Verbal cueing Transfer (Assistive device): None Locomotion :    Trunk/Postural Assessment : Cervical Assessment Cervical Assessment: Within Functional Limits Thoracic Assessment Thoracic Assessment: Within Functional Limits Lumbar Assessment Lumbar Assessment: Within Functional Limits Postural Control Postural Control: Deficits on evaluation Righting Reactions: slow but adequate  Balance: Balance Balance Assessed: Yes Static Sitting Balance Static Sitting - Balance Support: No upper extremity supported;Feet supported Static Sitting - Level of Assistance: 7: Independent Dynamic Sitting Balance Dynamic Sitting - Balance Support: Feet supported;No upper extremity supported Dynamic Sitting - Level of Assistance: 7: Independent Static Standing Balance Static Standing - Balance Support: During functional activity Static Standing - Level of Assistance: 6: Modified independent (Device/Increase time) Dynamic Standing Balance Dynamic Standing - Balance Support: During functional activity;No upper extremity supported Dynamic Standing - Level of Assistance: 6: Modified independent (Device/Increase time) Exercises:   Other Treatments:       Therapy/Group: Individual Therapy  Amberleigh Gerken 10/19/2023, 4:01 PM

## 2023-10-19 NOTE — Progress Notes (Signed)
 PROGRESS NOTE   Subjective/Complaints:  Discussed d/c date, physical therapy feels patient is ready.  Patient is not aware of any speech problem, will need ongoing outpatient therapy ROS: negative chest pain, shortness of breath, abd pain, or nausea, vomiting,  Objective:   No results found.  No results for input(s): "WBC", "HGB", "HCT", "PLT" in the last 72 hours.  No results for input(s): "NA", "K", "CL", "CO2", "GLUCOSE", "BUN", "CREATININE", "CALCIUM" in the last 72 hours.   Intake/Output Summary (Last 24 hours) at 10/19/2023 0850 Last data filed at 10/19/2023 0700 Gross per 24 hour  Intake 950 ml  Output --  Net 950 ml        Physical Exam: Vital Signs Blood pressure 115/67, pulse (!) 59, temperature 98.5 F (36.9 C), temperature source Oral, resp. rate 18, weight 46.8 kg, SpO2 95%.   General: No acute distress.  Resting comfortably in bed. Mood and affect are appropriate Heart: Regular rate and rhythm no rubs murmurs or extra sounds Lungs: Clear to auscultation, breathing unlabored, no rales or wheezes Abdomen: Positive bowel sounds, soft nontender to palpation, nondistended Extremities: No clubbing, cyanosis, or edema Skin: No evidence of breakdown, no evidence of rash over exposed surfaces  PRIOR EXAMS: Neurologic:  Awake, alert, oriented to self and time.  Moderate attention and memory deficits. Cranial nerves II through XII intact, motor strength is 5-/5 in bilateral deltoid, bicep, tricep, grip, hip flexor, knee extensors, ankle dorsiflexor and plantar flexor Sensory exam normal sensation to light touch  in bilateral upper and lower extremities but patient loses attention during task and is difficult to assess Cerebellar exam normal finger to nose to finger  in bilateral upper and lower extremities Musculoskeletal: Full range of motion in all 4 extremities. No joint swelling    Assessment/Plan: 1.  Functional deficits which require 3+ hours per day of interdisciplinary therapy in a comprehensive inpatient rehab setting. Physiatrist is providing close team supervision and 24 hour management of active medical problems listed below. Physiatrist and rehab team continue to assess barriers to discharge/monitor patient progress toward functional and medical goals  Care Tool:  Bathing    Body parts bathed by patient: Right arm, Left arm, Chest, Abdomen, Front perineal area, Right upper leg, Left upper leg, Face   Body parts bathed by helper: Buttocks, Left lower leg, Right lower leg     Bathing assist Assist Level: Minimal Assistance - Patient > 75%     Upper Body Dressing/Undressing Upper body dressing   What is the patient wearing?: Pull over shirt    Upper body assist Assist Level: Minimal Assistance - Patient > 75%    Lower Body Dressing/Undressing Lower body dressing      What is the patient wearing?: Incontinence brief, Pants     Lower body assist Assist for lower body dressing: Moderate Assistance - Patient 50 - 74%     Toileting Toileting    Toileting assist Assist for toileting: Moderate Assistance - Patient 50 - 74%     Transfers Chair/bed transfer  Transfers assist     Chair/bed transfer assist level: Supervision/Verbal cueing     Locomotion Ambulation   Ambulation assist  Assist level: Supervision/Verbal cueing Assistive device: No Device Max distance: 866 ft   Walk 10 feet activity   Assist     Assist level: Independent Assistive device: No Device   Walk 50 feet activity   Assist    Assist level: Supervision/Verbal cueing Assistive device: No Device    Walk 150 feet activity   Assist    Assist level: Supervision/Verbal cueing Assistive device: No Device    Walk 10 feet on uneven surface  activity   Assist     Assist level: Supervision/Verbal cueing Assistive device: Other (comment) (no device)    Wheelchair     Assist Is the patient using a wheelchair?: No (safe community ambulator)   Wheelchair activity did not occur: N/A         Wheelchair 50 feet with 2 turns activity    Assist    Wheelchair 50 feet with 2 turns activity did not occur: N/A       Wheelchair 150 feet activity     Assist  Wheelchair 150 feet activity did not occur: N/A       Blood pressure 115/67, pulse (!) 59, temperature 98.5 F (36.9 C), temperature source Oral, resp. rate 18, weight 46.8 kg, SpO2 95%.  Medical Problem List and Plan: 1. 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  Antithrombotics: -DVT/anticoagulation:  Pharmaceutical: Lovenox 40mg  daily -antiplatelet therapy: Aspirin and Plavix for three weeks followed by aspirin alone   3. Pain Management: Tylenol and robaxin as needed   4. Mood/Behavior/Sleep: LCSW to evaluate and provide emotional support             -antipsychotic agents: n/a   5. Neuropsych/cognition: This patient is not capable of making decisions on her own behalf. -  Notable Hx ADHD and mild cognitive impairment - per family deficits are currently worse than baseline -3-29: At home, takes Adderall 20 mg twice daily; resume 10 mg daily in AM for concentration and memory  6. Skin/Wound Care: Routine skin care checks   -DC peripheral IV 7. Fluids/Electrolytes/Nutrition: strict Is and Os and follow-up chemistries             -start carb modified diet             -continue B12 weekly for history of deficiency   -Admission labs stable 8: Hypertension: monitor TID and prn (home hydrochlorothiazide 25 mg held)             -continue losartan 12.5 mg daily             -continue metoprolol succ 25 mg daily  -10/15/23 BPs stable, continue    Vitals:   10/15/23 1329 10/15/23 1913  10/16/23 0457 10/16/23 1341  BP: 106/70 116/68 120/75 117/74   10/16/23 1919 10/17/23 0505 10/17/23 1311 10/17/23 1949  BP: 116/69 131/88 105/76 101/62   10/18/23 0424 10/18/23 1520 10/18/23 2015 10/19/23 0552  BP: 125/78 106/66 110/74 115/67     9: Hyperlipidemia: continue atorvastatin 40mg  daily, Zetia--zetia not reordered, weekday team to assess if this is still needed?  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  BP soft, no room to add Entresto. Add low-dose losartan 12.5 mg daily.  Can add SGLT2 and spironolactone as outpatient              -daily weight             -maintain K>4, mag>2              -follow-up [facility] as outpt Asymptomatic.  Signs of peripheral edema, tolerating ambulation and physical therapy  Filed Weights   10/17/23 0505 10/18/23 0424 10/19/23 0500  Weight: 48.1 kg 50.2 kg 46.8 kg    12: DM: on metformin 1000 mg BID at home>>not restarted; A1c = 6.2%             -monitor CBGs BID             -not requiring insulin coverage>>discontinue SSI  3/31  CBG (last 3)  Recent Labs    10/18/23 0603 10/18/23 1631 10/19/23 0554  GLUCAP 93 107* 98      13: History of mild cognitive impairment-- see #5   14: Hypokalemia: given supplement- resolved     Latest Ref Rng & Units 10/16/2023    5:36 AM 10/13/2023    6:54 AM 10/12/2023    6:28 AM  BMP  Glucose 70 - 99 mg/dL 161  096  045   BUN 8 - 23 mg/dL 13  13  13    Creatinine 0.44 - 1.00 mg/dL 4.09  8.11  9.14   Sodium 135 - 145 mmol/L 139  137  135   Potassium 3.5 - 5.1 mmol/L 4.1  4.0  3.4   Chloride 98 - 111 mmol/L 106  103  101   CO2 22 - 32 mmol/L 26  23  23    Calcium 8.9 - 10.3 mg/dL 9.1  9.0  9.2       15: Mild thrombocytopenia: follow-up CBC--stable   16: Constipation: prefers prunes/fruits/veg -10/15/23 LBM this morning per pt but not documented since 3/28; monitor     LOS: 7 days A FACE TO FACE EVALUATION WAS PERFORMED  Erick Colace 10/19/2023, 8:50 AM

## 2023-10-20 ENCOUNTER — Other Ambulatory Visit (HOSPITAL_COMMUNITY): Payer: Self-pay

## 2023-10-20 DIAGNOSIS — I6340 Cerebral infarction due to embolism of unspecified cerebral artery: Secondary | ICD-10-CM | POA: Diagnosis not present

## 2023-10-20 LAB — GLUCOSE, CAPILLARY: Glucose-Capillary: 98 mg/dL (ref 70–99)

## 2023-10-20 NOTE — Progress Notes (Signed)
 PROGRESS NOTE   Subjective/Complaints:  Sister, daughter and husband at bedside, no pain complaints slept well, discussed follow-up visits  ROS: negative chest pain, shortness of breath, abd pain, or nausea, vomiting,  Objective:   No results found.  No results for input(s): "WBC", "HGB", "HCT", "PLT" in the last 72 hours.  No results for input(s): "NA", "K", "CL", "CO2", "GLUCOSE", "BUN", "CREATININE", "CALCIUM" in the last 72 hours.   Intake/Output Summary (Last 24 hours) at 10/20/2023 0749 Last data filed at 10/19/2023 1300 Gross per 24 hour  Intake 118 ml  Output --  Net 118 ml        Physical Exam: Vital Signs Blood pressure 110/68, pulse (!) 57, temperature 98.2 F (36.8 C), resp. rate 16, weight 45.8 kg, SpO2 95%.   General: No acute distress.  Resting comfortably in bed. Mood and affect are appropriate Heart: Regular rate and rhythm no rubs murmurs or extra sounds Lungs: Clear to auscultation, breathing unlabored, no rales or wheezes Abdomen: Positive bowel sounds, soft nontender to palpation, nondistended Extremities: No clubbing, cyanosis, or edema Skin: No evidence of breakdown, no evidence of rash over exposed surfaces  PRIOR EXAMS: Neurologic:  Awake, alert, oriented to self and time.  Moderate attention and memory deficits. Cranial nerves II through XII intact, motor strength is 5-/5 in bilateral deltoid, bicep, tricep, grip, hip flexor, knee extensors, ankle dorsiflexor and plantar flexor Mild aphasia, receptive and expressive Cerebellar exam normal finger to nose to finger  in bilateral upper and lower extremities Musculoskeletal: Full range of motion in all 4 extremities. No joint swelling    Assessment/Plan: 1. Functional deficits due to left MCA distribution infarct  Stable for D/C today F/u PCP in 3-4 weeks F/u neurology 1-2 months No physical medicine rehab follow-up needed Follow-up  cardiology in 1 to 2 months See D/C summary See D/C instructions  Care Tool:  Bathing    Body parts bathed by patient: Right arm, Left arm, Chest, Abdomen, Front perineal area, Right upper leg, Left upper leg, Face, Buttocks, Left lower leg, Right lower leg   Body parts bathed by helper: Buttocks, Left lower leg, Right lower leg     Bathing assist Assist Level: Supervision/Verbal cueing     Upper Body Dressing/Undressing Upper body dressing   What is the patient wearing?: Pull over shirt    Upper body assist Assist Level: Supervision/Verbal cueing    Lower Body Dressing/Undressing Lower body dressing      What is the patient wearing?: Incontinence brief, Pants     Lower body assist Assist for lower body dressing: Supervision/Verbal cueing     Toileting Toileting    Toileting assist Assist for toileting: Supervision/Verbal cueing     Transfers Chair/bed transfer  Transfers assist     Chair/bed transfer assist level: Independent     Locomotion Ambulation   Ambulation assist      Assist level: Supervision/Verbal cueing Assistive device: No Device Max distance: 866 ft   Walk 10 feet activity   Assist     Assist level: Independent Assistive device: No Device   Walk 50 feet activity   Assist    Assist level: Supervision/Verbal cueing Assistive device:  No Device    Walk 150 feet activity   Assist    Assist level: Supervision/Verbal cueing Assistive device: No Device    Walk 10 feet on uneven surface  activity   Assist     Assist level: Supervision/Verbal cueing Assistive device: Other (comment) (no device)   Wheelchair     Assist Is the patient using a wheelchair?: No (safe community ambulator)   Wheelchair activity did not occur: N/A         Wheelchair 50 feet with 2 turns activity    Assist    Wheelchair 50 feet with 2 turns activity did not occur: N/A       Wheelchair 150 feet activity      Assist  Wheelchair 150 feet activity did not occur: N/A       Blood pressure 110/68, pulse (!) 57, temperature 98.2 F (36.8 C), resp. rate 16, weight 45.8 kg, SpO2 95%.  Medical Problem List and Plan: 1. 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  Antithrombotics: -DVT/anticoagulation:  Pharmaceutical: Lovenox 40mg  daily -antiplatelet therapy: Aspirin and Plavix for three weeks followed by aspirin alone   3. Pain Management: Tylenol and robaxin as needed   4. Mood/Behavior/Sleep: LCSW to evaluate and provide emotional support             -antipsychotic agents: n/a   5. Neuropsych/cognition: This patient is not capable of making decisions on her own behalf. -  Notable Hx ADHD and mild cognitive impairment - per family deficits are currently worse than baseline -3-29: At home, takes Adderall 20 mg twice daily; resume 10 mg daily in AM for concentration and memory  6. Skin/Wound Care: Routine skin care checks   -DC peripheral IV 7. Fluids/Electrolytes/Nutrition: strict Is and Os and follow-up chemistries             -start carb modified diet             -continue B12 weekly for history of deficiency   -Admission labs stable 8: Hypertension: monitor TID and prn (home hydrochlorothiazide 25 mg held)             -continue losartan 12.5 mg daily             -continue metoprolol succ 25 mg daily  Blood pressure is well-controlled on current regimen    Vitals:   10/17/23 0505 10/17/23 1311 10/17/23 1949 10/18/23 0424  BP: 131/88 105/76 101/62 125/78   10/18/23 1520 10/18/23 2015 10/19/23 0552 10/19/23 1513  BP: 106/66 110/74 115/67 100/71   10/19/23 1532 10/19/23 1600 10/19/23 1936 10/20/23 0452  BP: 109/67 109/67 125/75 110/68     9: Hyperlipidemia: continue atorvastatin 40mg  daily, may restart  Zetia-- 10: Hypothyroidism: continue Synthroid daily   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  BP soft, no room to add Entresto. Add low-dose losartan 12.5 mg daily.  Can add SGLT2 and spironolactone as outpatient              -daily weight             -maintain K>4, mag>2              -  follow-up [facility] as outpt Asymptomatic.  Signs of peripheral edema, tolerating ambulation and physical therapy  Filed Weights   10/18/23 0424 10/19/23 0500 10/20/23 0500  Weight: 50.2 kg 46.8 kg 45.8 kg    12: DM: on metformin 1000 mg BID at home>>not restarted; A1c = 6.2%             -monitor CBGs BID             -not requiring insulin coverage>>discontinue SSI  3/31  CBG (last 3)  Recent Labs    10/19/23 1711 10/19/23 2156 10/20/23 0638  GLUCAP 128* 119* 98      13: History of mild cognitive impairment-- see #5   14: Hypokalemia: given supplement- resolved     Latest Ref Rng & Units 10/16/2023    5:36 AM 10/13/2023    6:54 AM 10/12/2023    6:28 AM  BMP  Glucose 70 - 99 mg/dL 469  629  528   BUN 8 - 23 mg/dL 13  13  13    Creatinine 0.44 - 1.00 mg/dL 4.13  2.44  0.10   Sodium 135 - 145 mmol/L 139  137  135   Potassium 3.5 - 5.1 mmol/L 4.1  4.0  3.4   Chloride 98 - 111 mmol/L 106  103  101   CO2 22 - 32 mmol/L 26  23  23    Calcium 8.9 - 10.3 mg/dL 9.1  9.0  9.2       15: Mild thrombocytopenia: follow-up CBC--stable    LOS: 8 days A FACE TO FACE EVALUATION WAS PERFORMED  Erick Colace 10/20/2023, 7:50 AM

## 2023-10-20 NOTE — Progress Notes (Signed)
 Inpatient Rehabilitation Discharge Medication Review by a Pharmacist  A complete drug regimen review was completed for this patient to identify any potential clinically significant medication issues.  High Risk Drug Classes Is patient taking? Indication by Medication  Antipsychotic No   Anticoagulant No   Antibiotic No   Opioid No   Antiplatelet Yes Aspirin and Plavix for 3 weeks then Aspirin alone-  CVA. DAPT started 10/10/23.  End date /last day to take Plavix is 10/30/2023.  Hypoglycemics/insulin No   Vasoactive Medication Yes Metoprolol XL, Losartan- HTN, acute combined HF    Chemotherapy No   Other Yes Acetaminophen- pain  management  Amphetamine-Dextramphetamine - alertness Atorvastatin, Ezetimibe - HLD Cyanocobalamin- B12 supplement Levothyroxine-hypothyroid     Type of Medication Issue Identified Description of Issue Recommendation(s)  Drug Interaction(s) (clinically significant)     Duplicate Therapy     Allergy     No Medication Administration End Date     Incorrect Dose     Additional Drug Therapy Needed     Significant med changes from prior encounter (inform family/care partners about these prior to discharge).    Other       Clinically significant medication issues were identified that warrant physician communication and completion of prescribed/recommended actions by midnight of the next day:  No   Pharmacist comments:   Time spent performing this drug regimen review (minutes):  15   Velecia Ovitt, Judie Bonus 10/20/2023 9:28 AM

## 2023-10-21 NOTE — Plan of Care (Signed)
  Problem: RH Balance Goal: LTG Patient will maintain dynamic sitting balance (PT) Description: LTG:  Patient will maintain dynamic sitting balance with assistance during mobility activities (PT) Outcome: Completed/Met Flowsheets (Taken 10/13/2023 1413 by Lucio Edward, PT) LTG: Pt will maintain dynamic sitting balance during mobility activities with:: Independent Goal: LTG Patient will maintain dynamic standing balance (PT) Description: LTG:  Patient will maintain dynamic standing balance with assistance during mobility activities (PT) Outcome: Completed/Met Flowsheets (Taken 10/20/2023 1627) LTG: Pt will maintain dynamic standing balance during mobility activities with:: Independent with assistive device    Problem: Sit to Stand Goal: LTG:  Patient will perform sit to stand with assistance level (PT) Description: LTG:  Patient will perform sit to stand with assistance level (PT) Outcome: Completed/Met Flowsheets (Taken 10/20/2023 1627) LTG: PT will perform sit to stand in preparation for functional mobility with assistance level: Independent   Problem: RH Bed Mobility Goal: LTG Patient will perform bed mobility with assist (PT) Description: LTG: Patient will perform bed mobility with assistance, with/without cues (PT). Outcome: Completed/Met Flowsheets (Taken 10/13/2023 1413 by Lucio Edward, PT) LTG: Pt will perform bed mobility with assistance level of: Independent   Problem: RH Bed to Chair Transfers Goal: LTG Patient will perform bed/chair transfers w/assist (PT) Description: LTG: Patient will perform bed to chair transfers with assistance (PT). Outcome: Completed/Met Flowsheets (Taken 10/13/2023 1413 by Lucio Edward, PT) LTG: Pt will perform Bed to Chair Transfers with assistance level: Supervision/Verbal cueing   Problem: RH Car Transfers Goal: LTG Patient will perform car transfers with assist (PT) Description: LTG: Patient will perform car transfers with  assistance (PT). Outcome: Completed/Met Flowsheets (Taken 10/13/2023 1413 by Lucio Edward, PT) LTG: Pt will perform car transfers with assist:: Supervision/Verbal cueing   Problem: RH Furniture Transfers Goal: LTG Patient will perform furniture transfers w/assist (OT/PT) Description: LTG: Patient will perform furniture transfers  with assistance (OT/PT). Outcome: Completed/Met Flowsheets (Taken 10/13/2023 1413 by Lucio Edward, PT) LTG: Pt will perform furniture transfers with assist:: Supervision/Verbal cueing   Problem: RH Floor Transfers Goal: LTG Patient will perform floor transfers w/assist (PT) Description: LTG: Patient will perform floor transfers with assistance (PT). Outcome: Completed/Met Flowsheets (Taken 10/13/2023 1413 by Lucio Edward, PT) LTG: PT WILL PERFORM FLOOR TRANFERS  WITH  ASSIST:: Supervision/Verbal cueing   Problem: RH Ambulation Goal: LTG Patient will ambulate in controlled environment (PT) Description: LTG: Patient will ambulate in a controlled environment, # of feet with assistance (PT). Outcome: Completed/Met Flowsheets (Taken 10/13/2023 1413 by Lucio Edward, PT) LTG: Pt will ambulate in controlled environ  assist needed:: Supervision/Verbal cueing LTG: Ambulation distance in controlled environment: 200 Goal: LTG Patient will ambulate in home environment (PT) Description: LTG: Patient will ambulate in home environment, # of feet with assistance (PT). Outcome: Completed/Met Flowsheets (Taken 10/13/2023 1413 by Lucio Edward, PT) LTG: Pt will ambulate in home environ  assist needed:: Supervision/Verbal cueing LTG: Ambulation distance in home environment: 50   Problem: RH Stairs Goal: LTG Patient will ambulate up and down stairs w/assist (PT) Description: LTG: Patient will ambulate up and down # of stairs with assistance (PT) Outcome: Completed/Met Flowsheets (Taken 10/13/2023 1413 by Lucio Edward, PT) LTG: Pt will  ambulate up/down stairs assist needed:: Supervision/Verbal cueing LTG: Pt will  ambulate up and down number of stairs: 14

## 2023-10-26 ENCOUNTER — Ambulatory Visit: Admitting: Adult Health

## 2023-10-26 ENCOUNTER — Encounter: Payer: Self-pay | Admitting: Adult Health

## 2023-10-26 VITALS — BP 120/70 | HR 58 | Temp 97.5°F | Ht 59.0 in | Wt 109.0 lb

## 2023-10-26 DIAGNOSIS — I639 Cerebral infarction, unspecified: Secondary | ICD-10-CM

## 2023-10-26 DIAGNOSIS — I69320 Aphasia following cerebral infarction: Secondary | ICD-10-CM

## 2023-10-26 DIAGNOSIS — I10 Essential (primary) hypertension: Secondary | ICD-10-CM

## 2023-10-26 DIAGNOSIS — R7303 Prediabetes: Secondary | ICD-10-CM

## 2023-10-26 DIAGNOSIS — I5041 Acute combined systolic (congestive) and diastolic (congestive) heart failure: Secondary | ICD-10-CM | POA: Diagnosis not present

## 2023-10-26 DIAGNOSIS — I6931 Attention and concentration deficit following cerebral infarction: Secondary | ICD-10-CM | POA: Diagnosis not present

## 2023-10-26 DIAGNOSIS — E782 Mixed hyperlipidemia: Secondary | ICD-10-CM

## 2023-10-26 DIAGNOSIS — E039 Hypothyroidism, unspecified: Secondary | ICD-10-CM

## 2023-10-26 DIAGNOSIS — F909 Attention-deficit hyperactivity disorder, unspecified type: Secondary | ICD-10-CM

## 2023-10-26 NOTE — Progress Notes (Addendum)
 Subjective:    Patient ID: Gabrielle Vargas, female    DOB: 08/23/1950, 73 y.o.   MRN: [id number]  HPI 73 year old female who  has a past medical history of ADHD (attention deficit hyperactivity disorder) (05/09/2007), Diverticulitis of colon (without mention of hemorrhage) (09/17/2013), Essential hypertension (10/09/2017), History of COVID-19 (2021), Hyperlipidemia, Hypothyroidism, Low back pain radiating to right leg (10/09/2017), Mild cognitive impairment of uncertain or unknown etiology (08/04/2022), Mild intermittent asthma with acute exacerbation (05/30/2017), Osteoarthritis, Postmenopausal atrophic vaginitis, severe (08/18/2008), Right knee pain (03/13/2014), S/P arthroscopy of right knee (06/03/2014), and Vitamin B12 deficiency.  She presents with her husband today for TCM visit  Admit Date 10/08/2023 Discharge Date 10/12/2023 Discharged from Rehab: 10/20/2023  She presented to the emergency room from home for acute onset left-sided weakness and dyspnea.  Hospital Course  Acute Ischemic Stroke  - Received IV TNK at 1800 on 10/08/2023/ NIG on Admission 8. NIH at discharge 2.  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  No significant proximal stenosis, aneurysm, or branch vessel occlusion within the circle of Willis.  Minimal atherosclerotic change at the proximal left ICA without significant stenosis.  Aortic atherosclerosis - 24-hour head CT showed chronic atrophy changes no acute abnormality or hemorrhage. - MRI showed multiple punctuate acute infarcts, bilateral frontal and left parietal lobes. - Routine EEG was suggestive of moderate diffuse encephalopathy.  No seizures or epileptic discharges were seen throughout the recording. - The echo showed an EF of 30 to 35%.  LV with grade 1 diastolic dysfunction - She was placed on aspirin  81 mg and Plavix  75 mg for 3 weeks then aspirin  alone. -  Loop recorder placed on 10/12/2023  Hypertension  - Started on Toprol  Xl 25 mg daily and losartan  12.5 mg daily.  -D/c HCTZ  Hyperlipidemia - Continued with zetia  10 mg. Simvastatin  20 changed to Atorvastatin  40 mg daily.   Prediabetes  - A1c 6.2 - Continued with home metformin    Hypothyroidism  - Continued on Synthroid  75 mcg daily.   She was discharged to rehab she made improvement in activity tolerance, balance, postural control as well as ability to compensate for deficits.  She had improvement in functional use of right upper extremity and left upper extremity as well as improvement in bilateral lower extremities as well.  In rehab her Adderall was decreased to 10 mg daily, metformin  and hydrochlorothiazide  were discontinued.  He was referred to PT/OT/SLP [facility].  Today she presents with her husband, who helps provide history.  The patient reports that she does not remember much from the hospital admission.  She is doing well overall and the only deficit that her husband has noticed is sometimes she will have word finding issues and decreased attention span.  She is sleeping about 14 hours a day but is staying busy during her awake hours.  Her appetite is improving and they have been working on a healthier diet with no junk food.  They have their follow-up appointments with cardiology, neurology, physical medicine and rehab scheduled and plan to start doing outpatient PT/OT/SLP shortly.  Had been keeping track of blood pressure at home with readings from 102/69 to 116/69.  She has no acute complaints today and is very happy with her progress so far.   Review of Systems  Constitutional: Negative.   HENT: Negative.    Eyes: Negative.   Respiratory: Negative.  Cardiovascular: Negative.   Gastrointestinal: Negative.   Endocrine: Negative.   Genitourinary: Negative.   Musculoskeletal: Negative.   Skin: Negative.   Allergic/Immunologic: Negative.    Neurological: Negative.   Hematological: Negative.   Psychiatric/Behavioral:  Positive for decreased concentration and sleep disturbance.    Past Medical History:  Diagnosis Date   ADHD (attention deficit hyperactivity disorder) 05/09/2007   Diverticulitis of colon (without mention of hemorrhage) 09/17/2013   Essential hypertension 10/09/2017   History of COVID-19 2021   Hyperlipidemia    Hypothyroidism    Low back pain radiating to right leg 10/09/2017   Mild cognitive impairment of uncertain or unknown etiology 08/04/2022   Mild intermittent asthma with acute exacerbation 05/30/2017   Osteoarthritis    Postmenopausal atrophic vaginitis, severe 08/18/2008   Right knee pain 03/13/2014   S/P arthroscopy of right knee 06/03/2014   Vitamin B12 deficiency     Social History   Socioeconomic History   Marital status: Married    Spouse name: Not on file   Number of children: 2   Years of education: 13   Highest education level: Some college, no degree  Occupational History   Occupation: Retired  Tobacco Use   Smoking status: Never   Smokeless tobacco: Never  Vaping Use   Vaping status: Never Used  Substance and Sexual Activity   Alcohol use: No   Drug use: No   Sexual activity: Not on file  Other Topics Concern   Not on file  Social History Narrative   Retired   Married   2 children   Right handed   Live with husband   Social Drivers of Corporate investment banker Strain: Low Risk  (12/23/2022)   Overall Financial Resource Strain (CARDIA)    Difficulty of Paying Living Expenses: Not hard at all  Food Insecurity: Patient Unable To Answer (10/09/2023)   Hunger Vital Sign    Worried About Running Out of Food in the Last Year: Patient unable to answer    Ran Out of Food in the Last Year: Patient unable to answer  Transportation Needs: Patient Unable To Answer (10/09/2023)   PRAPARE - Transportation    Lack of Transportation (Medical): Patient unable to answer    Lack  of Transportation (Non-Medical): Patient unable to answer  Physical Activity: Sufficiently Active (12/23/2022)   Exercise Vital Sign    Days of Exercise per Week: 7 days    Minutes of Exercise per Session: 60 min  Stress: No Stress Concern Present (12/23/2022)   [employer] of Occupational Health - Occupational Stress Questionnaire    Feeling of Stress : Not at all  Social Connections: Patient Unable To Answer (10/09/2023)   Social Connection and Isolation Panel [NHANES]    Frequency of Communication with Friends and Family: Patient unable to answer    Frequency of Social Gatherings with Friends and Family: Patient unable to answer    Attends Religious Services: Patient unable to answer    Active Member of Clubs or Organizations: Patient unable to answer    Attends Banker Meetings: Patient unable to answer    Marital Status: Patient unable to answer  Intimate Partner Violence: Patient Unable To Answer (10/09/2023)   Humiliation, Afraid, Rape, and Kick questionnaire    Fear of Current or Ex-Partner: Patient unable to answer    Emotionally Abused: Patient unable to answer    Physically Abused: Patient unable to answer    Sexually Abused: Patient unable to answer  Past Surgical History:  Procedure Laterality Date   ABDOMINAL HYSTERECTOMY     BREAST SURGERY     CARPAL TUNNEL RELEASE Bilateral    CERVICAL DISC ARTHROPLASTY     CESAREAN SECTION     x2   left shoulder surgery     LOOP RECORDER INSERTION N/A 10/12/2023   Procedure: LOOP RECORDER INSERTION;  Surgeon: Boyce Byes, MD;  Location: [facility];  Service: Cardiovascular;  Laterality: N/A;   MENISCUS REPAIR Right 2017   REDUCTION MAMMAPLASTY Bilateral    right shoulder     TONSILLECTOMY      Family History  Problem Relation Age of Onset   Melanoma Mother    Kidney disease Father    Diabetes Father    Heart disease Father    Breast cancer Paternal Grandmother    ADD / ADHD Other         multiple family members   Colon cancer Neg Hx    Esophageal cancer Neg Hx    Rectal cancer Neg Hx    Stomach cancer Neg Hx     Allergies  Allergen Reactions   Ativan  [Lorazepam ]     Agitation/paradoxical reaction.   Oxycodone-Acetaminophen  Nausea Only   Latex Hives and Rash    Current Outpatient Medications on File Prior to Visit  Medication Sig Dispense Refill   Acetylcysteine (NAC) 500 MG CAPS Take 500 mg by mouth daily.     amphetamine -dextroamphetamine  (ADDERALL) 10 MG tablet Take 1 tablet (10 mg total) by mouth daily with breakfast. This dose is decreased as before admission.     Ascorbic Acid (VITAMIN C) 1000 MG tablet Take 1,000 mg by mouth daily.     aspirin  81 MG chewable tablet Chew 1 tablet (81 mg total) by mouth daily. 100 tablet 0   atorvastatin  (LIPITOR) 40 MG tablet Take 1 tablet (40 mg total) by mouth daily. 30 tablet 0   Bioflavonoid Products (QUERCETIN COMPLEX IMMUNE PO) Take 500 mg by mouth daily.     Cholecalciferol (D3-50 PO) Take 1 capsule by mouth daily.     clopidogrel  (PLAVIX ) 75 MG tablet Take 1 tablet (75 mg total) by mouth daily for 11 days. 11 tablet 0   Evening Primrose Oil 1000 MG CAPS Take 1,000 mg by mouth in the morning and at bedtime.     ezetimibe  (ZETIA ) 10 MG tablet TAKE 1 TABLET BY MOUTH DAILY (Patient taking differently: Take 10 mg by mouth at bedtime.) 90 tablet 3   losartan  (COZAAR ) 25 MG tablet Take 0.5 tablets (12.5 mg total) by mouth daily. 15 tablet 0   metoprolol  succinate (TOPROL -XL) 25 MG 24 hr tablet Take 1 tablet (25 mg total) by mouth daily. 30 tablet 0   SYNTHROID  75 MCG tablet TAKE 1 TABLET BY MOUTH DAILY 90 tablet 3   vitamin B-12 (CYANOCOBALAMIN ) 1000 MCG tablet Take 1,000 mcg by mouth 2 (two) times a week.     Zinc 20 MG CAPS Take 20 mg by mouth daily.     No current facility-administered medications on file prior to visit.    BP 120/70   Pulse (!) 58   Temp (!) 97.5 F (36.4 C) (Oral)   Ht 4\' 11"  (1.499 m)   Wt 109 lb  (49.4 kg)   SpO2 97%   BMI 22.02 kg/m       Objective:   Physical Exam Vitals and nursing note reviewed.  Constitutional:      Appearance: Normal appearance.  HENT:  Mouth/Throat:     Tongue: Tongue does not deviate from midline.  Cardiovascular:     Rate and Rhythm: Normal rate and regular rhythm.     Pulses: Normal pulses.     Heart sounds: Normal heart sounds.  Pulmonary:     Effort: Pulmonary effort is normal.     Breath sounds: Normal breath sounds.  Musculoskeletal:        General: Normal range of motion.  Skin:    General: Skin is warm and dry.     Findings: Bruising (rigth lower arm and left shoulder) present.  Neurological:     General: No focal deficit present.     Mental Status: She is alert and oriented to person, place, and time.     Cranial Nerves: Cranial nerves 2-12 are intact.     Sensory: Sensation is intact.     Motor: Motor function is intact. No weakness or tremor.     Coordination: Coordination is intact.     Gait: Gait is intact.     Deep Tendon Reflexes: Reflexes are normal and symmetric.     Comments: 5/5 strength bilateral upper and lower extremities 5/5 grip strength bilateral upper extremities.  Normal shoulder shrug Mild word finding difficulty at times but can easily follow conversation and instructions.   Psychiatric:        Mood and Affect: Mood normal.        Behavior: Behavior normal.        Thought Content: Thought content normal.        Judgment: Judgment normal.       Assessment & Plan:  1. Acute ischemic stroke [facility]) (Primary) - Reviewed hospital notes, discharge instructions, labs, imaging, medication changes with the patient and her husband.  All questions answered to the best of my ability.  - He appears to have made tremendous progress in doing amazing - Encouraged follow-up with specialist as directed.  - Advance activity as tolerated, advised not pushing herself at this time. - Continue heart healthy diet.   2.  Essential hypertension - Controlled on current medications. No change until seen by Cardiology   3. Mixed hyperlipidemia - Continue Atorvastatin  40 mg daily   4. Prediabetes - Ok with d/c metformin . Will recheck A1c in one month  - Continue with healthy diet limiting carbs and sugars   5. Hypothyroidism, unspecified type - Continue with Synthroid    6. Attention deficit hyperactivity disorder (ADHD), unspecified ADHD type - Continue with Adderall 10 mg daily   7. Acute combined systolic and diastolic heart failure (HCC) -Pete echo in 3 months.  With where her blood pressure is currently I do not see much room to add SGLT2 at this time.  Will defer to cardiology.   Taisei Bonnette, NP

## 2023-10-31 ENCOUNTER — Encounter: Attending: Physical Medicine & Rehabilitation | Admitting: Physical Medicine & Rehabilitation

## 2023-10-31 ENCOUNTER — Encounter: Payer: Self-pay | Admitting: Physical Medicine & Rehabilitation

## 2023-10-31 VITALS — BP 123/68 | HR 61 | Ht 59.0 in | Wt 108.1 lb

## 2023-10-31 DIAGNOSIS — I69320 Aphasia following cerebral infarction: Secondary | ICD-10-CM | POA: Insufficient documentation

## 2023-10-31 NOTE — Progress Notes (Signed)
 Subjective:    Patient ID: Gabrielle Vargas, female    DOB: [birth year], 73 y.o.   MRN: [id number]  73 y.o. female  who presented to [facility] ED on 10/08/2023 with sudden onset of left facial droop and left-sided weakness. Code stroke activated. 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 No significant proximal stenosis, aneurysm, or branch vessel occlusion within the Circle of Willis. Minimal atherosclerotic change at the proximal left ICA without significant stenosis. Aortic atherosclerosis. Incidentally imaged spine findings on CTA: Cervical fusion at C4-5 and C5-6. Slight degenerative anterolisthesis at C2-3 and C3-4.  EKG: Sinus rhythm; Atrial premature complex; Nonspecific T abnormalities, lateral leads. Labs: Na 133, K 2.8, glucose 110, ionized calcium 1.08, LFTs normal, BUN and Cr normal, CBC normal, U/A normal, UDS positive for amphetamine (on Adderall at home).   Received TNK at ~1800 hours. Neurology consulted and started on aspirin and Plavix. MRI: Multiple punctate acute infarcts bilateral frontal and left parietal lobes.  Admit date: 10/12/2023 Discharge date: 10/20/2023 HPI  73 year old female with history of cervical fusion and attention deficit disorder who was hospitalized at [facility] for left facial weakness.  Workup revealed left parietal as well as bilateral frontal strokes likely embolic.  Echocardiogram revealed moderately reduced ejection fraction 30 to 35%, no significant valvular disease, no atrial clot. She received TN K and then restarted on aspirin and Plavix x 3 weeks and now is on aspirin alone.  She has followed up with primary care.  She is taking a statin as well as Zetia She is now at home with her husband.  She is independent with all self-care and mobility.  She is still having some problems with attention and concentration.  She has not resumed driving.  She has done some gardening with her  husband and fell in the garden on a slope.  Both she and her husband feel like otherwise her balance is close to normal.  She does not note any weakness in her hands or coordination issues. Pain Inventory Average Pain 0 Pain Right Now 0 My pain is  no pain  BOWEL Number of stools per week: 4-5  BLADDER Normal  Mobility walk without assistance ability to climb steps?  yes  Function retired  Neuro/Psych confusion anxiety  Prior Studies Any changes since last visit?  no  Physicians involved in your care Any changes since last visit?  no  has seen PCP 10/26/23, sees cardiologist 11/13/23   Family History  Problem Relation Age of Onset   Melanoma Mother    Kidney disease Father    Diabetes Father    Heart disease Father    Breast cancer Paternal Grandmother    ADD / ADHD Other        multiple family members   Colon cancer Neg Hx    Esophageal cancer Neg Hx    Rectal cancer Neg Hx    Stomach cancer Neg Hx    Social History   Socioeconomic History   Marital status: Married    Spouse name: Not on file   Number of children: 2   Years of education: 13   Highest education level: Some college, no degree  Occupational History   Occupation: Retired  Tobacco Use   Smoking status: Never   Smokeless tobacco: Never  Vaping Use   Vaping status: Never Used  Substance and Sexual Activity   Alcohol use: No   Drug  use: No   Sexual activity: Not on file  Other Topics Concern   Not on file  Social History Narrative   Retired   Married   2 children   Right handed   Live with husband   Social Drivers of Corporate investment banker Strain: Low Risk  (12/23/2022)   Overall Financial Resource Strain (CARDIA)    Difficulty of Paying Living Expenses: Not hard at all  Food Insecurity: Patient Unable To Answer (10/09/2023)   Hunger Vital Sign    Worried About Running Out of Food in the Last Year: Patient unable to answer    Ran Out of Food in the Last Year: Patient unable to  answer  Transportation Needs: Patient Unable To Answer (10/09/2023)   PRAPARE - Transportation    Lack of Transportation (Medical): Patient unable to answer    Lack of Transportation (Non-Medical): Patient unable to answer  Physical Activity: Sufficiently Active (12/23/2022)   Exercise Vital Sign    Days of Exercise per Week: 7 days    Minutes of Exercise per Session: 60 min  Stress: No Stress Concern Present (12/23/2022)   [employer] of Occupational Health - Occupational Stress Questionnaire    Feeling of Stress : Not at all  Social Connections: Patient Unable To Answer (10/09/2023)   Social Connection and Isolation Panel [NHANES]    Frequency of Communication with Friends and Family: Patient unable to answer    Frequency of Social Gatherings with Friends and Family: Patient unable to answer    Attends Religious Services: Patient unable to answer    Active Member of Clubs or Organizations: Patient unable to answer    Attends Banker Meetings: Patient unable to answer    Marital Status: Patient unable to answer   Past Surgical History:  Procedure Laterality Date   ABDOMINAL HYSTERECTOMY     BREAST SURGERY     CARPAL TUNNEL RELEASE Bilateral    CERVICAL DISC ARTHROPLASTY     CESAREAN SECTION     x2   left shoulder surgery     LOOP RECORDER INSERTION N/A 10/12/2023   Procedure: LOOP RECORDER INSERTION;  Surgeon: Lanier Prude, MD;  Location: [facility];  Service: Cardiovascular;  Laterality: N/A;   MENISCUS REPAIR Right 2017   REDUCTION MAMMAPLASTY Bilateral    right shoulder     TONSILLECTOMY     Past Medical History:  Diagnosis Date   ADHD (attention deficit hyperactivity disorder) 05/09/2007   Diverticulitis of colon (without mention of hemorrhage) 09/17/2013   Essential hypertension 10/09/2017   History of COVID-19 2021   Hyperlipidemia    Hypothyroidism    Low back pain radiating to right leg 10/09/2017   Mild cognitive impairment of  uncertain or unknown etiology 08/04/2022   Mild intermittent asthma with acute exacerbation 05/30/2017   Osteoarthritis    Postmenopausal atrophic vaginitis, severe 08/18/2008   Right knee pain 03/13/2014   S/P arthroscopy of right knee 06/03/2014   Vitamin B12 deficiency    BP 123/68   Pulse 61   Ht 4\' 11"  (1.499 m)   Wt 108 lb 1.6 oz (49 kg)   SpO2 95%   BMI 21.83 kg/m   Opioid Risk Score:   Fall Risk Score:  `1  Depression screen [facility] 2/9     10/31/2023    3:28 PM 12/23/2022   12:46 PM 12/14/2021    9:22 AM 08/17/2021    8:55 AM 12/03/2020    9:53 AM 02/05/2020  8:30 AM 10/09/2017    9:04 AM  Depression screen PHQ 2/9  Decreased Interest 0 0 0 0 0 0 0  Down, Depressed, Hopeless 0 0 0 0 0 0 0  PHQ - 2 Score 0 0 0 0 0 0 0  Altered sleeping 0   0     Tired, decreased energy 0   0     Change in appetite 0   0     Feeling bad or failure about yourself  0   0     Trouble concentrating 0   3     Moving slowly or fidgety/restless 0   0     Suicidal thoughts 0        PHQ-9 Score 0   3     Difficult doing work/chores    Not difficult at all        Review of Systems  Hematological:  Bruises/bleeds easily.       On plavix for one more day  Psychiatric/Behavioral:  Positive for confusion. The patient is nervous/anxious.   All other systems reviewed and are negative.      Objective:   Physical Exam General No acute distress Mood and affect appropriate Speech reduced fluency positive word finding problems, comprehension appears intact, no evidence of dysarthria Memory recalls 3 out of 3 objects immediately, remembers 2 out of 3 objects after 3-minute delay Visual fields are intact confrontation testing Extraocular muscles are intact although she feels like her vision is a little blurred looking toward the right and to the left. Motor strength is 5/5 bilateral deltoid, bicep, tricep and grip, hip flexor, knee extensor, ankle dorsiflexion plantarflexion Negative straight leg  raise bilaterally Ambulates without assistive device no evidence to drag and instability Romberg is negative She is able to toe walk and heel walk but is having difficulty with tandem gait.  She does not recall whether she can do tandem gait before her stroke.        Assessment & Plan:   1.  Probable cardioembolic strokes although not clearly demonstrated, she does have a loop recorder and has follow up appointment scheduled with cardiology.  She remains on aspirin for stroke prophylaxis. In terms of her functional status her deficits are mainly cognitive as well as speech will make referral to speech therapy. At this point I do not think she has a clear-cut need for PT OT.  If she continues have problems with balance and regarding she may benefit from some additional PT. We discussed revision she will follow-up with pneumology in regards to checking eye pressures.  We discussed that getting new glasses at this time would not be advisable given the proximity to the stroke and the potential for some changes in her vision over time as she recovers. I have made referral to neurology for stroke follow-up She has an appointment with neuropsychology as well She will need to follow-up with cardiology as above as well as with primary care. No physical medicine rehab follow-up needed at this time

## 2023-11-06 DIAGNOSIS — H40053 Ocular hypertension, bilateral: Secondary | ICD-10-CM | POA: Diagnosis not present

## 2023-11-09 ENCOUNTER — Ambulatory Visit: Admitting: Physician Assistant

## 2023-11-12 NOTE — Progress Notes (Unsigned)
 Cardiology Office Note    Date:  11/13/2023  ID:  Gabrielle Vargas, DOB 26-Sep-1950, MRN [id number] PCP:  [name], NP  Cardiologist:  Ola Berger, MD  Electrophysiologist:  Boyce Byes, MD   Chief Complaint: f/u CHF, stroke  History of Present Illness: .    Gabrielle Vargas is a 73 y.o. 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 She was admitted in 09/2023 for stroke and received TNK. 2D echo showed EF 30-35% without prior to compare to, mildly dilated LV, G1DD, mild MR, mild AI, no obvious LV thrombus with Definity  contrast however given apical and mid to distal septal WMAs patient seems to be at high risk for cardio-embolic events. General cardiology raised question of Takotsubo cardiomyopathy. She was recommended for initiation of GDMT and repeat echo in 3 months - if LVEF remained reduced would plan ischemic eval. Neurology recommended ASA + Plavix  x3 weeks then ASA alone - cardiology team did discuss empiric anticoagulation with neurology but given lack of LV thrombus seen, recommendation was for antiplatelets at discharge. She also had brief NSVT observed. EP placed ILR as well. Simvastatin  was changed to atorvastatin .  She is seen for follow-up with her husband and doing well without any cardiac symptoms. No CP, SOB, palpitations, edema, orthopnea, syncope or dizziness. They bring in a log of her BPs which primarily range from 99-120s systolic, predominantly 100s. She has begun gradually increasing activity and they've worked up to walking around [employer]. She has been having occasional nosebleeds at night.  Labwork independently reviewed: 09/2023 Cbc ok, K 4.1 (episodically low), Cr 0.79, Mg 2.1, LDL 33, trig 87, LFTs ok 08/2023 TSH OK  ROS: .     Please see the history of present illness.  All other systems are reviewed and otherwise negative.  Studies Reviewed: Aaron Aas    EKG:  EKG is ordered today, personally reviewed, demonstrating: EKG Interpretation Date/Time:  Monday November 13 2023 14:17:12 EDT Ventricular Rate:  60 PR Interval:  154 QRS Duration:  86 QT Interval:  446 QTC Calculation: 446 R Axis:   74  Text Interpretation: Normal sinus rhythm Normal ECG Confirmed by Tuwana Kapaun [phone]) on 11/13/2023 2:44:39 PM    CV Studies: Cardiac studies reviewed are outlined and summarized above. Otherwise please see EMR for full report.   Current Reported Medications:.    Current Meds  Medication Sig   Acetylcysteine (NAC) 500 MG CAPS Take 500 mg by mouth daily.   amphetamine -dextroamphetamine  (ADDERALL) 10 MG tablet Take 1 tablet (10 mg total) by mouth daily with breakfast. This dose is decreased as before admission.   Ascorbic Acid (VITAMIN C) 1000 MG tablet Take 1,000 mg by mouth daily.   aspirin  81 MG chewable tablet Chew 1 tablet (81 mg total) by mouth daily.   atorvastatin  (LIPITOR) 40 MG tablet Take 1 tablet (40 mg total) by mouth daily.   Bioflavonoid Products (QUERCETIN COMPLEX IMMUNE PO) Take 500 mg by mouth daily.   Cholecalciferol (D3-50 PO) Take 1 capsule by mouth daily.   Evening Primrose Oil 1000 MG CAPS Take 1,000 mg by mouth in the morning and at bedtime.   ezetimibe  (ZETIA ) 10 MG tablet TAKE 1 TABLET BY MOUTH DAILY (Patient taking differently: Take 10 mg by mouth at bedtime.)   losartan  (COZAAR ) 25 MG tablet Take 0.5 tablets (12.5 mg total) by  mouth daily.   metoprolol  succinate (TOPROL -XL) 25 MG 24 hr tablet Take 1 tablet (25 mg total) by mouth daily.   SYNTHROID  75 MCG tablet TAKE 1 TABLET BY MOUTH DAILY   vitamin B-12 (CYANOCOBALAMIN ) 1000 MCG tablet Take 1,000 mcg by mouth 2 (two) times a week.   Zinc 20 MG CAPS Take 20 mg by mouth daily.    Physical Exam:    VS:  BP 118/78 (BP Location: Left Arm,  Patient Position: Sitting)   Pulse 60   Ht 4\' 11"  (1.499 m)   Wt 110 lb (49.9 kg)   SpO2 95%   BMI 22.22 kg/m    Wt Readings from Last 3 Encounters:  11/13/23 110 lb (49.9 kg)  10/31/23 108 lb 1.6 oz (49 kg)  10/26/23 109 lb (49.4 kg)    GEN: Well nourished, well developed in no acute distress NECK: No JVD; + right carotid bruit CARDIAC: RRR, no murmurs, rubs, gallops. ILR site well healed without complication RESPIRATORY:  Clear to auscultation without rales, wheezing or rhonchi  ABDOMEN: Soft, non-tender, non-distended EXTREMITIES:  No edema; No acute deformity   Asessement and Plan:.    1. Cryptogenic stroke, HLD - on ASA presently, with ILR in progress with home monitoring set up (first interrogation scheduled 5/2 per EMR). Cardiac plan as below. They had several questions about her neurology progress and I recommended they contact neurology to consider sooner follow-up. We will recheck CMET and lipids today with her labs.  2. Chronic HFrEF with HTN - etiology of cardiomyopathy unclear. Per hospital recommendations, recheck echo at 3 month mark (will schedule on/after 01/12/24) and if EF improved, no further ischemic workup needed, but if EF still down, will need to consider invasive ischemic evaluation. Recheck labs today - CMET, CBC, TSH, Mg, lipid profile with direct LDL. Hold off GDMT titration given BP trends they brought in with tendency for low-normal values at times. For now she is tolerating the present regimen - losartan  12.5mg  daily, Toprol  25mg  daily. She is euvolemic on exam. Denies any recent cardiac complaints. Also advised they discuss ongoing therapy of her ADHD medication with prescriber given her new development of cardiovascular disease (HF, stroke), to have risk/benefit discussion.  3. Mild AI, MR - will be able to reassess by echo as above, do not suspect intervention will be needed at this time, anticipate clinical surveillance in future years.  4. Epistaxis -  occasionally occurring at nighttime. Check CBC, PT, PTT, INR and refer to ENT. She is on ASA predominantly for history of stroke at this point so any clearance to stop this would need to come through neurology.   5. Right carotid bruit - check carotid US . 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 Janett Medin in follow-up.   Disposition: F/u with me after late June echo.  Signed, Reinette Cuneo N Kellen Hover, PA-C

## 2023-11-13 ENCOUNTER — Other Ambulatory Visit: Payer: Self-pay | Admitting: *Deleted

## 2023-11-13 ENCOUNTER — Encounter: Payer: Self-pay | Admitting: Physician Assistant

## 2023-11-13 ENCOUNTER — Ambulatory Visit: Attending: Physician Assistant | Admitting: Physician Assistant

## 2023-11-13 VITALS — BP 118/78 | HR 60 | Ht 59.0 in | Wt 110.0 lb

## 2023-11-13 DIAGNOSIS — I38 Endocarditis, valve unspecified: Secondary | ICD-10-CM

## 2023-11-13 DIAGNOSIS — E785 Hyperlipidemia, unspecified: Secondary | ICD-10-CM

## 2023-11-13 DIAGNOSIS — R0989 Other specified symptoms and signs involving the circulatory and respiratory systems: Secondary | ICD-10-CM

## 2023-11-13 DIAGNOSIS — I10 Essential (primary) hypertension: Secondary | ICD-10-CM

## 2023-11-13 DIAGNOSIS — R040 Epistaxis: Secondary | ICD-10-CM | POA: Diagnosis not present

## 2023-11-13 DIAGNOSIS — I5022 Chronic systolic (congestive) heart failure: Secondary | ICD-10-CM

## 2023-11-13 DIAGNOSIS — I639 Cerebral infarction, unspecified: Secondary | ICD-10-CM

## 2023-11-13 NOTE — Patient Instructions (Addendum)
 Medication Instructions:  Your physician recommends that you continue on your current medications as directed. Please refer to the Current Medication list given to you today.  *If you need a refill on your cardiac medications before your next appointment, please call your pharmacy*  Lab Work: TODAY:  CMET, MAG, LIPID, DIRECT LDL, CBC, TSH,  APTT & PT/INR  If you have labs (blood work) drawn today and your tests are completely normal, you will receive your results only by: MyChart Message (if you have MyChart) OR A paper copy in the mail If you have any lab test that is abnormal or we need to change your treatment, we will call you to review the results.  Testing/Procedures: Your physician has requested that you have an echocardiogram AFTER 01/12/24. Echocardiography is a painless test that uses sound waves to create images of your heart. It provides your doctor with information about the size and shape of your heart and how well your heart's chambers and valves are working. This procedure takes approximately one hour. There are no restrictions for this procedure. Please do NOT wear cologne, perfume, aftershave, or lotions (deodorant is allowed). Please arrive 15 minutes prior to your appointment time.  Please note: We ask at that you not bring children with you during ultrasound (echo/ vascular) testing. Due to room size and safety concerns, children are not allowed in the ultrasound rooms during exams. Our front office staff cannot provide observation of children in our lobby area while testing is being conducted. An adult accompanying a patient to their appointment will only be allowed in the ultrasound room at the discretion of the ultrasound technician under special circumstances. We apologize for any inconvenience.   Your physician has requested that you have a carotid duplex. This test is an ultrasound of the carotid arteries in your neck. It looks at blood flow through these arteries that  supply the brain with blood. Allow one hour for this exam. There are no restrictions or special instructions.  You have been referred to [facility] TH[facility]  Follow-Up: At [facility], you and your health needs are our priority.  As part of our continuing mission to provide you with exceptional heart care, our providers are all part of one team.  This team includes your primary Cardiologist (physician) and Advanced Practice Providers or APPs (Physician Assistants and Nurse Practitioners) who all work together to provide you with the care you need, when you need it.  Your next appointment:   1-2 WEEKS AFTER ECHO  Provider:   Graciela Lava, PA-C          We recommend signing up for the patient portal called "MyChart".  Sign up information is provided on this After Visit Summary.  MyChart is used to connect with patients for Virtual Visits (Telemedicine).  Patients are able to view lab/test results, encounter notes, upcoming appointments, etc.  Non-urgent messages can be sent to your provider as well.   To learn more about what you can do with MyChart, go to [email or web address].   Other Instructions

## 2023-11-14 ENCOUNTER — Other Ambulatory Visit: Payer: Self-pay | Admitting: *Deleted

## 2023-11-14 ENCOUNTER — Telehealth: Payer: Self-pay | Admitting: Physician Assistant

## 2023-11-14 DIAGNOSIS — Z79899 Other long term (current) drug therapy: Secondary | ICD-10-CM

## 2023-11-14 LAB — CBC
Hematocrit: 41.2 % (ref 34.0–46.6)
Hemoglobin: 13.4 g/dL (ref 11.1–15.9)
MCH: 28.5 pg (ref 26.6–33.0)
MCHC: 32.5 g/dL (ref 31.5–35.7)
MCV: 88 fL (ref 79–97)
Platelets: 208 10*3/uL (ref 150–450)
RBC: 4.7 x10E6/uL (ref 3.77–5.28)
RDW: 14.1 % (ref 11.7–15.4)
WBC: 8.3 10*3/uL (ref 3.4–10.8)

## 2023-11-14 LAB — COMPREHENSIVE METABOLIC PANEL WITH GFR
ALT: 52 IU/L — ABNORMAL HIGH (ref 0–32)
AST: 33 IU/L (ref 0–40)
Albumin: 4.7 g/dL (ref 3.8–4.8)
Alkaline Phosphatase: 125 IU/L — ABNORMAL HIGH (ref 44–121)
BUN/Creatinine Ratio: 49 — ABNORMAL HIGH (ref 12–28)
BUN: 30 mg/dL — ABNORMAL HIGH (ref 8–27)
Bilirubin Total: 0.3 mg/dL (ref 0.0–1.2)
CO2: 24 mmol/L (ref 20–29)
Calcium: 10.3 mg/dL (ref 8.7–10.3)
Chloride: 103 mmol/L (ref 96–106)
Creatinine, Ser: 0.61 mg/dL (ref 0.57–1.00)
Globulin, Total: 2.4 g/dL (ref 1.5–4.5)
Glucose: 93 mg/dL (ref 70–99)
Potassium: 4.7 mmol/L (ref 3.5–5.2)
Sodium: 140 mmol/L (ref 134–144)
Total Protein: 7.1 g/dL (ref 6.0–8.5)
eGFR: 93 mL/min/{1.73_m2} (ref >59)

## 2023-11-14 LAB — LIPID PANEL
Chol/HDL Ratio: 2.3 ratio (ref 0.0–4.4)
Cholesterol, Total: 126 mg/dL (ref 100–199)
HDL: 56 mg/dL (ref >39)
LDL Chol Calc (NIH): 46 mg/dL (ref 0–99)
Triglycerides: 139 mg/dL (ref 0–149)
VLDL Cholesterol Cal: 24 mg/dL (ref 5–40)

## 2023-11-14 LAB — LDL CHOLESTEROL, DIRECT: LDL Direct: 42 mg/dL (ref 0–99)

## 2023-11-14 LAB — PROTIME-INR
INR: 1 (ref 0.9–1.2)
Prothrombin Time: 11 s (ref 9.1–12.0)

## 2023-11-14 LAB — TSH: TSH: 1.98 u[IU]/mL (ref 0.450–4.500)

## 2023-11-14 LAB — APTT: aPTT: 24 s (ref 24–33)

## 2023-11-14 LAB — MAGNESIUM: Magnesium: 2.3 mg/dL (ref 1.6–2.3)

## 2023-11-14 NOTE — Telephone Encounter (Signed)
 Returned call to pt's husband, [name], [place] on file.  See result note.

## 2023-11-14 NOTE — Telephone Encounter (Signed)
-----   Message from Dayna N Dunn sent at 11/14/2023  8:04 AM EDT ----- Patient is documented to have some cognitive decline so husband helped with OV yesterday. Please let them know clotting numbers and blood counts were normal. Cholesterol well controlled. CMET looked like she was a little on the drier side and mild abnormality in LFTs. Would suggest increasing water  intake (but not to excess) and repeat CMET in 1 week - eat a good meal and drink well that day before labs.

## 2023-11-14 NOTE — Telephone Encounter (Signed)
 Follow Up:    Husband is returning a call from today, concerning her lab results

## 2023-11-15 ENCOUNTER — Other Ambulatory Visit: Payer: Self-pay | Admitting: Adult Health

## 2023-11-15 MED ORDER — ATORVASTATIN CALCIUM 40 MG PO TABS
40.0000 mg | ORAL_TABLET | Freq: Every day | ORAL | 3 refills | Status: DC
Start: 2023-11-15 — End: 2024-01-22

## 2023-11-15 MED ORDER — LOSARTAN POTASSIUM 25 MG PO TABS: 12.5000 mg | ORAL_TABLET | Freq: Every day | ORAL | 3 refills | Status: DC

## 2023-11-15 MED ORDER — SYNTHROID 75 MCG PO TABS
75.0000 ug | ORAL_TABLET | Freq: Every day | ORAL | 3 refills | Status: AC
Start: 2023-11-15 — End: ?

## 2023-11-15 MED ORDER — EZETIMIBE 10 MG PO TABS: 10.0000 mg | ORAL_TABLET | Freq: Every day | ORAL | 3 refills | Status: AC

## 2023-11-15 MED ORDER — METOPROLOL SUCCINATE ER 25 MG PO TB24: 25.0000 mg | ORAL_TABLET | Freq: Every day | ORAL | 3 refills | Status: DC

## 2023-11-15 MED ORDER — AMPHETAMINE-DEXTROAMPHETAMINE 10 MG PO TABS: 10.0000 mg | ORAL_TABLET | Freq: Every day | ORAL | 0 refills | Status: DC

## 2023-11-15 NOTE — Telephone Encounter (Signed)
 Copied from CRM [id number]. Topic: Clinical - Medication Refill >> Nov 15, 2023 12:18 PM Shardie S wrote: Most Recent Primary Care Visit:  Provider: [name]  Department: [facility]  Visit Type: HOSPITAL FOLLOW UP  Date: 10/26/2023  Medication:  atorvastatin  (LIPITOR) 40 MG tablet  losartan  (COZAAR ) 25 MG tablet -wanting to know if tablet can be given in lowered dosage so that he does not have to break medication in half.  metoprolol  succinate (TOPROL -XL) 25 MG 24 hr tablet   Has the patient contacted their pharmacy? Yes (Agent: If no, request that the patient contact the pharmacy for the refill. If patient does not wish to contact the pharmacy document the reason why and proceed with request.) (Agent: If yes, when and what did the pharmacy advise?)  Is this the correct pharmacy for this prescription? Yes If no, delete pharmacy and type the correct one.  This is the patient's preferred pharmacy:  [facility] [id number] - [place], [place] - [address] [place] [address] Phone: [phone] Fax: [phone]   Has the prescription been filled recently? No  Is the patient out of the medication? Yes  Has the patient been seen for an appointment in the last year OR does the patient have an upcoming appointment? Yes  Can we respond through MyChart? Yes, prefers phone call  Agent: Please be advised that Rx refills may take up to 3 business days. We ask that you follow-up with your pharmacy.

## 2023-11-15 NOTE — Telephone Encounter (Signed)
 Okay for refill?

## 2023-11-15 NOTE — Telephone Encounter (Signed)
 Copied from CRM [id number]. Topic: Clinical - Medication Refill >> Nov 15, 2023 12:25 PM Juluis Ok wrote: Most Recent Primary Care Visit:  Provider: [name]  Department: [facility]  Visit Type: HOSPITAL FOLLOW UP  Date: 10/26/2023  Medication: amphetamine -dextroamphetamine  (ADDERALL) 10 MG tablet SYNTHROID  75 MCG tablet ezetimibe  (ZETIA ) 10 MG tablet   Has the patient contacted their pharmacy? Yes (Agent: If no, request that the patient contact the pharmacy for the refill. If patient does not wish to contact the pharmacy document the reason why and proceed with request.) (Agent: If yes, when and what did the pharmacy advise?)  Is this the correct pharmacy for this prescription? Yes If no, delete pharmacy and type the correct one.  This is the patient's preferred pharmacy:  [facility] [id number] - [place], [place] - [address] [place] [address] Phone: [phone] Fax: [phone]   Has the prescription been filled recently? No  Is the patient out of the medication? Yes  Has the patient been seen for an appointment in the last year OR does the patient have an upcoming appointment? Yes  Can we respond through MyChart? No, prefers phone call  Agent: Please be advised that Rx refills may take up to 3 business days. We ask that you follow-up with your pharmacy.

## 2023-11-17 ENCOUNTER — Ambulatory Visit (INDEPENDENT_AMBULATORY_CARE_PROVIDER_SITE_OTHER)

## 2023-11-17 DIAGNOSIS — I639 Cerebral infarction, unspecified: Secondary | ICD-10-CM

## 2023-11-17 LAB — CUP PACEART REMOTE DEVICE CHECK
Date Time Interrogation Session: 20250502131518
Implantable Pulse Generator Implant Date: 20250327

## 2023-11-19 ENCOUNTER — Encounter: Payer: Self-pay | Admitting: Cardiology

## 2023-11-20 DIAGNOSIS — Z79899 Other long term (current) drug therapy: Secondary | ICD-10-CM | POA: Diagnosis not present

## 2023-11-21 ENCOUNTER — Encounter: Payer: Self-pay | Admitting: *Deleted

## 2023-11-21 ENCOUNTER — Ambulatory Visit: Admitting: Rehabilitative and Restorative Service Providers"

## 2023-11-21 ENCOUNTER — Other Ambulatory Visit: Payer: Self-pay

## 2023-11-21 ENCOUNTER — Encounter: Payer: Self-pay | Admitting: Rehabilitative and Restorative Service Providers"

## 2023-11-21 ENCOUNTER — Ambulatory Visit

## 2023-11-21 ENCOUNTER — Other Ambulatory Visit: Payer: Self-pay | Admitting: *Deleted

## 2023-11-21 ENCOUNTER — Ambulatory Visit: Attending: Physical Medicine & Rehabilitation | Admitting: Occupational Therapy

## 2023-11-21 DIAGNOSIS — E875 Hyperkalemia: Secondary | ICD-10-CM | POA: Diagnosis not present

## 2023-11-21 DIAGNOSIS — R2689 Other abnormalities of gait and mobility: Secondary | ICD-10-CM | POA: Insufficient documentation

## 2023-11-21 DIAGNOSIS — M6281 Muscle weakness (generalized): Secondary | ICD-10-CM | POA: Diagnosis not present

## 2023-11-21 DIAGNOSIS — R41840 Attention and concentration deficit: Secondary | ICD-10-CM | POA: Diagnosis not present

## 2023-11-21 DIAGNOSIS — I69318 Other symptoms and signs involving cognitive functions following cerebral infarction: Secondary | ICD-10-CM | POA: Insufficient documentation

## 2023-11-21 DIAGNOSIS — R4701 Aphasia: Secondary | ICD-10-CM | POA: Insufficient documentation

## 2023-11-21 DIAGNOSIS — R41841 Cognitive communication deficit: Secondary | ICD-10-CM | POA: Diagnosis not present

## 2023-11-21 LAB — COMPREHENSIVE METABOLIC PANEL WITH GFR
ALT: 41 IU/L — ABNORMAL HIGH (ref 0–32)
AST: 24 IU/L (ref 0–40)
Albumin: 4.7 g/dL (ref 3.8–4.8)
Alkaline Phosphatase: 131 IU/L — ABNORMAL HIGH (ref 44–121)
BUN/Creatinine Ratio: 26 (ref 12–28)
BUN: 19 mg/dL (ref 8–27)
Bilirubin Total: 0.5 mg/dL (ref 0.0–1.2)
CO2: 25 mmol/L (ref 20–29)
Calcium: 10.2 mg/dL (ref 8.7–10.3)
Chloride: 99 mmol/L (ref 96–106)
Creatinine, Ser: 0.74 mg/dL (ref 0.57–1.00)
Globulin, Total: 2.1 g/dL (ref 1.5–4.5)
Glucose: 102 mg/dL — ABNORMAL HIGH (ref 70–99)
Potassium: 5.3 mmol/L — ABNORMAL HIGH (ref 3.5–5.2)
Sodium: 140 mmol/L (ref 134–144)
Total Protein: 6.8 g/dL (ref 6.0–8.5)
eGFR: 84 mL/min/{1.73_m2} (ref >59)

## 2023-11-21 NOTE — Therapy (Signed)
 OUTPATIENT OCCUPATIONAL THERAPY NEURO EVALUATION  [name] MRN: [id number] DOB:10/09/1950, 73 y.o., female Today's Date: 11/21/2023  PCP: [name], NP REFERRING PROVIDER: [name], MD  END OF SESSION:  OT End of Session - 11/21/23 1545     Visit Number 1    Number of Visits 11    Date for OT Re-Evaluation 01/05/24    Authorization Type [employer]    OT Start Time 1405    OT Stop Time 1452    OT Time Calculation (min) 47 min             Past Medical History:  Diagnosis Date   ADHD (attention deficit hyperactivity disorder) 05/09/2007   Diverticulitis of colon (without mention of hemorrhage) 09/17/2013   Essential hypertension 10/09/2017   History of COVID-19 2021   Hyperlipidemia    Hypothyroidism    Low back pain radiating to right leg 10/09/2017   Mild cognitive impairment of uncertain or unknown etiology 08/04/2022   Mild intermittent asthma with acute exacerbation 05/30/2017   Osteoarthritis    Postmenopausal atrophic vaginitis, severe 08/18/2008   Right knee pain 03/13/2014   S/P arthroscopy of right knee 06/03/2014   Vitamin B12 deficiency    Past Surgical History:  Procedure Laterality Date   ABDOMINAL HYSTERECTOMY     BREAST SURGERY     CARPAL TUNNEL RELEASE Bilateral    CERVICAL DISC ARTHROPLASTY     CESAREAN SECTION     x2   left shoulder surgery     LOOP RECORDER INSERTION N/A 10/12/2023   Procedure: LOOP RECORDER INSERTION;  Surgeon: Boyce Byes, MD;  Location: [facility];  Service: Cardiovascular;  Laterality: N/A;   MENISCUS REPAIR Right 2017   REDUCTION MAMMAPLASTY Bilateral    right shoulder     TONSILLECTOMY     Patient Active Problem List   Diagnosis Date Noted   NSVT (nonsustained ventricular tachycardia) (HCC) 10/12/2023   CVA (cerebral vascular accident) (HCC) 10/12/2023   Acute combined systolic and diastolic heart failure (HCC) 10/11/2023   HFrEF (heart failure with  reduced ejection fraction) (HCC) 10/10/2023   Acute ischemic stroke (HCC) 10/08/2023   MCI (mild cognitive impairment) 08/04/2022   Vitamin B12 deficiency    Osteoarthritis 08/03/2022   Hyperlipidemia 08/03/2022   History of COVID-19 2021   Essential hypertension 10/09/2017   Low back pain radiating to right leg 10/09/2017   Mild intermittent asthma with acute exacerbation 05/30/2017   S/P arthroscopy of right knee 06/03/2014   Right knee pain 03/13/2014   Diverticulitis of colon (without mention of hemorrhage) 09/17/2013   Postmenopausal atrophic vaginitis, severe 08/18/2008   Hypothyroidism 05/09/2007   ADHD (attention deficit hyperactivity disorder) 05/09/2007    ONSET DATE: 10/08/23  REFERRING DIAG: I63.9 (ICD-10-CM) - Cerebral infarction, unspecified  THERAPY DIAG:  Attention and concentration deficit  Other symptoms and signs involving cognitive functions following cerebral infarction  Muscle weakness (generalized)  Rationale for Evaluation and Treatment: Rehabilitation  SUBJECTIVE:   SUBJECTIVE STATEMENT: Pt reports that "the mental part and memory" are the most challenging.  Pt reports that spouse is helping with meal prep tasks and spouse reports issues with time management and remembering sequencing and ingredients when cooking. Pt's spouse reports that it takes increased time to get thoughts in order to sequence or initiate a task.   Pt accompanied by: self and spouse  PERTINENT HISTORY: ADHD, HTN, diverticulitis, HLD, hypothyroidism, low back pain, neck fusion, mild cognitive impairment, OA in  R knee, asthma   presented to [facility] ED on 10/08/2023 with sudden onset of left facial droop, left-sided weakness,and L visual cut. MRI: Multiple punctate acute infarcts bilateral frontal and left parietal lobes. EEG suggestive of moderate diffuse encephalopathy.   PRECAUTIONS: Fall  WEIGHT BEARING RESTRICTIONS: No  PAIN:  Are you having pain? No  FALLS: Has patient  fallen in last 6 months? No  LIVING ENVIRONMENT: Lives with: lives with their spouse Lives in: House/apartment Stairs: Yes: Internal: bedroom/bathroom on main floor, does have a full flight of steps - spouse's office is upstairs  steps; one rail and External: 2 steps;   Has following equipment at home: shower chair and hand held shower head  PLOF: Independent, Independent with basic ADLs, and Leisure: walking up to 4-6 hours per day  PATIENT GOALS: "I really don't know"  OBJECTIVE:  Note: Objective measures were completed at Evaluation unless otherwise noted.  HAND DOMINANCE: Right  ADLs: Overall ADLs: Mod I with bathing and dressing, pt's spouse will go in and out of bathroom while pt in shower providing distant supervision to Mod I level Transfers/ambulation related to ADLs: Mod I without AD, getting into high bed without any issue  IADLs: Shopping: pt gets overwhelmed with everything that is going on, easily distracted  Meal Prep: spouse is doing most of the cooking right now as pt forgetful with sequencing and ingredients, and easily distracted Community mobility: not cleared to drive at this time Medication management: spouse is assisting now, he will fill up a daily pill box for pt   MOBILITY STATUS:  Mod I without AD  POSTURE COMMENTS:  No Significant postural limitations  ACTIVITY TOLERANCE: Activity tolerance: pt reports worn out after 30 min walk (where as they would walk up to 2 hrs at a time before stroke).  Pt is not sleeping as much during the day time.  FUNCTIONAL OUTCOME MEASURES: 9 hole peg test: Right: 36.54 sec; Left: 34.06 sec Trail Making: 42.47 sec for Trail A   UPPER EXTREMITY ROM:  WFL bilaterally  UPPER EXTREMITY MMT:     MMT Right eval Left eval  Shoulder flexion 4+ 4-  Shoulder abduction    Shoulder adduction    Shoulder extension    Shoulder internal rotation    Shoulder external rotation    Middle trapezius    Lower trapezius     Elbow flexion 4 4  Elbow extension 4- 4-  Wrist flexion    Wrist extension    Wrist ulnar deviation    Wrist radial deviation    Wrist pronation    Wrist supination    (Blank rows = not tested)  COORDINATION: 9 Hole Peg test: Right: 36.54 sec; Left: 34.06 sec Box and Blocks:  Right 43 blocks, Left 36blocks - Pt requiring cues x3 to remember to only pick up one block at a time when completing with Right; requiring cues to go over barrier instead of around (possibly slowing down pace even more).  SENSATION: WFL  COGNITION: Overall cognitive status: Impaired and History of cognitive impairments - at baseline Pt able to recall 3 words: sock blue bed after 5 mins  Pt with difficulty with word finding and memory  VISION: Subjective report: wears glasses, reported f/u with neurology and ophthalmologist who report to wait for further assessment to allow recovery s/p CVA Baseline vision: Wears glasses all the time  VISION ASSESSMENT: To be further assessed in functional context Eye alignment: WFL Ocular ROM: WFL Gaze preference/alignment: WDL Tracking/Visual  pursuits: TBA Visual Fields: TBA in functional context   Bell cancellation test: locating 32/35 in 3:00, pt then able to locate remaining 2/3 in 4:20 before stating "I think that is all"  PERCEPTION: Impaired  PRAXIS: Impaired: Initiation, Motor planning, and Organization  OBSERVATIONS: Difficult to assess vision, attention, and strength as pt requiring increased cueing and repeat directions.  Unsure full attention and effort during MMT this session.  Pt forgetting instructions during coordination assessment, requiring wait time for recall and/or additional cues.                                                                                                                             TREATMENT DATE:  11/21/23 Educated on activities to keep thinking skills sharp while focusing on attention and visual attention.  OT providing  with recommendations for hidden pictures and/or word searches, playing card games for vision and sequencing, and/or computer/phone games.    PATIENT EDUCATION: Education details: Educated on role and purpose of OT as well as potential interventions and goals for therapy based on initial evaluation findings. Person educated: Patient and Spouse Education method: Explanation and Handouts Education comprehension: verbalized understanding and needs further education  HOME EXERCISE PROGRAM: Provided written suggestions for keeping thinking skills sharp - plan to provide printout at future session   GOALS: Goals reviewed with patient? Yes  SHORT TERM GOALS: Target date: 12/15/23  Pt will be independent with coordination HEP with visual handouts for recall. Baseline: new to OPOT Goal status: INITIAL  2.  Pt will demonstrate understanding of memory compensations and ways to keep thinking skills sharp Baseline: STM deficits and impaired initiation Goal status: INITIAL  3.  Pt will demonstrate and/or verbalize understanding of visual attention exercises and compensatory strategies for improved visual scanning and attention. Baseline: left field cut Goal status: INITIAL  4.  Pt will demonstrate improved coordination, attention to task, and recall of instructions by completing box and blocks assessment with improved score by 6 blocks bilaterally. Baseline: Right 43 blocks, Left 36blocks - Pt requiring cues x3 to remember to only pick up one block at a time when completing with Right; requiring cues to go over barrier instead of around (possibly slowing down pace even more). Goal status: INITIAL   LONG TERM GOALS: Target date: 01/05/24  Pt will verbalize understanding of task modifications, adaptive strategies, and/or potential AE needs to increase ease, safety, and independence w/ IADLs. Baseline: decreased engagement in IADLs due to impaired memory and attention Goal status: INITIAL  2.   Pt will complete simulated medication management activity w/ Supervision by discharge, incorporating compensatory strategies/AE prn Baseline: spouse managing meds at this time, was not prior to CVA Goal status: INITIAL  3.  Pt will demonstrate ability to sequence simple functional task (simple snack prep, laundry task, etc) at Mod I level with good safety awareness and ability to utilize memory strategies for recall. Baseline:  Goal status: INITIAL  4.  Pt  will navigate a moderately busy environment, completing dual task activity and/or following multi-step commands with 90% accuracy Baseline:  Goal status: INITIAL  5.  Pt will demonstrate improved initiation and sequencing to be able to navigate use of personal cell phone and send text messages in <15 mins. Baseline: spouse reporting 45 mins to reply to test message Goal status: INITIAL   ASSESSMENT:  CLINICAL IMPRESSION: Patient is a 73 y.o. female who was seen today for occupational therapy evaluation for impairments s/p CVA.  Pt and spouse report patient with most difficulty with memory, initiation, sequencing, processing speed, and time management with ADLs and IADLs.  Pt lives with spouse in 2 story home with bedroom/bathroom on main floor.  Pt was very active prior to CVA and has resumed walking for exercise with spouse. Pt will benefit from skilled occupational therapy services to address strength and coordination, GM/FM control, cognition, safety awareness, introduction of compensatory strategies/AE prn, visual-perception, and implementation of an HEP to improve participation and safety during ADLs and IADLs.    PERFORMANCE DEFICITS: in functional skills including ADLs, IADLs, coordination, strength, Fine motor control, Gross motor control, endurance, decreased knowledge of precautions, vision, and UE functional use, cognitive skills including attention, energy/drive, memory, problem solving, safety awareness, and sequencing, and  psychosocial skills including environmental adaptation and routines and behaviors.   IMPAIRMENTS: are limiting patient from ADLs, IADLs, leisure, and social participation.   CO-MORBIDITIES: may have co-morbidities  that affects occupational performance. Patient will benefit from skilled OT to address above impairments and improve overall function.  MODIFICATION OR ASSISTANCE TO COMPLETE EVALUATION: No modification of tasks or assist necessary to complete an evaluation.  OT OCCUPATIONAL PROFILE AND HISTORY: Problem focused assessment: Including review of records relating to presenting problem.  CLINICAL DECISION MAKING: LOW - limited treatment options, no task modification necessary  REHAB POTENTIAL: Good  EVALUATION COMPLEXITY: Low    PLAN:  OT FREQUENCY: 1-2x/week  OT DURATION: 6 weeks  PLANNED INTERVENTIONS: 97168 OT Re-evaluation, 97535 self care/ADL training, 16109 therapeutic exercise, 97530 therapeutic activity, 97112 neuromuscular re-education, functional mobility training, visual/perceptual remediation/compensation, psychosocial skills training, energy conservation, coping strategies training, patient/family education, and DME and/or AE instructions  RECOMMENDED OTHER SERVICES: SLP  CONSULTED AND AGREED WITH PLAN OF CARE: Patient and family member/caregiver  PLAN FOR NEXT SESSION:  educate on memory strategies and keeping thinking skills sharp  Cognitive/motor dual tasking, sequencing, recall of 2 step directions  Phone use  Further assess , edu on visual compensatory strategies   Anthonette Kinsman, OTR/L 11/21/2023, 3:47 PM  [facility] at [facility] [address] [place], [place] [address] Phone # [phone] Fax # [phone]

## 2023-11-21 NOTE — Therapy (Signed)
 OUTPATIENT PHYSICAL THERAPY NEURO EVALUATION   [name] MRN: [id number] DOB:[birth year], 73 y.o., female Today's Date: 11/21/2023  PCP: [name], NP REFERRING PROVIDER: Janeece Mechanic, MD  END OF SESSION:  PT End of Session - 11/21/23 1311     Visit Number 1    Number of Visits 16    Date for PT Re-Evaluation 01/20/24    Authorization Type [employer]    PT Start Time 1314    PT Stop Time 1356    PT Time Calculation (min) 42 min    Activity Tolerance Patient tolerated treatment well    Behavior During Therapy [facility] for tasks assessed/performed             Past Medical History:  Diagnosis Date   ADHD (attention deficit hyperactivity disorder) 05/09/2007   Diverticulitis of colon (without mention of hemorrhage) 09/17/2013   Essential hypertension 10/09/2017   History of COVID-19 2021   Hyperlipidemia    Hypothyroidism    Low back pain radiating to right leg 10/09/2017   Mild cognitive impairment of uncertain or unknown etiology 08/04/2022   Mild intermittent asthma with acute exacerbation 05/30/2017   Osteoarthritis    Postmenopausal atrophic vaginitis, severe 08/18/2008   Right knee pain 03/13/2014   S/P arthroscopy of right knee 06/03/2014   Vitamin B12 deficiency    Past Surgical History:  Procedure Laterality Date   ABDOMINAL HYSTERECTOMY     BREAST SURGERY     CARPAL TUNNEL RELEASE Bilateral    CERVICAL DISC ARTHROPLASTY     CESAREAN SECTION     x2   left shoulder surgery     LOOP RECORDER INSERTION N/A 10/12/2023   Procedure: LOOP RECORDER INSERTION;  Surgeon: Boyce Byes, MD;  Location: [facility];  Service: Cardiovascular;  Laterality: N/A;   MENISCUS REPAIR Right 2017   REDUCTION MAMMAPLASTY Bilateral    right shoulder     TONSILLECTOMY     Patient Active Problem List   Diagnosis Date Noted   NSVT (nonsustained ventricular tachycardia) (HCC) 10/12/2023   CVA (cerebral vascular accident) (HCC)  10/12/2023   Acute combined systolic and diastolic heart failure (HCC) 10/11/2023   HFrEF (heart failure with reduced ejection fraction) (HCC) 10/10/2023   Acute ischemic stroke (HCC) 10/08/2023   MCI (mild cognitive impairment) 08/04/2022   Vitamin B12 deficiency    Osteoarthritis 08/03/2022   Hyperlipidemia 08/03/2022   History of COVID-19 2021   Essential hypertension 10/09/2017   Low back pain radiating to right leg 10/09/2017   Mild intermittent asthma with acute exacerbation 05/30/2017   S/P arthroscopy of right knee 06/03/2014   Right knee pain 03/13/2014   Diverticulitis of colon (without mention of hemorrhage) 09/17/2013   Postmenopausal atrophic vaginitis, severe 08/18/2008   Hypothyroidism 05/09/2007   ADHD (attention deficit hyperactivity disorder) 05/09/2007    ONSET DATE: 10/08/23 REFERRING DIAG: Cerebral infarction THERAPY DIAG:  Other abnormalities of gait and mobility  Rationale for Evaluation and Treatment: Rehabilitation  SUBJECTIVE:  SUBJECTIVE STATEMENT: The patient is s/p CVA on 10/08/23 with initial c/o L sided weakness, aphasia, L facial droop, and L visual cut. She d/c home from [facility] Rehab in early April and has been walking for exercise. Her husband reports that she had a bad episode of vertigo a week ago with vomitting (was gone the next morning; she has had those episodes of vertigo in the past).  Pt accompanied by:  spouse  PERTINENT HISTORY: ADHD, HTN, diverticulitis, HLD, hypothyroidism, low back pain, neck fusion, mild cognitive impairment, OA in R knee, asthma  PAIN:  Are you having pain? No  PRECAUTIONS: Fall  WEIGHT BEARING RESTRICTIONS: No  FALLS: Has patient fallen in last 6 months? No  LIVING ENVIRONMENT: Lives with: lives with their spouse Lives in:  House/apartment Stairs: Yes: Internal: 12-14  steps; one rail Has following equipment at home: None  PLOF: Independent  PATIENT GOALS: Improve word finding, stop veering to the left  OBJECTIVE:  Note: Objective measures were completed at Evaluation unless otherwise noted.  COGNITION: Overall cognitive status: Impaired and History of cognitive impairments - at baseline   SENSATION: WFL  COORDINATION: WNLs and symmetrical for FTN, RAM, and HTS  POSTURE: No Significant postural limitations  LOWER EXTREMITY AROM:   WNLs  LOWER EXTREMITY MMT:    Active  Right Eval Left Eval  Hip flexion 5/5 5/5  Hip extension    Hip abduction    Hip adduction    Hip internal rotation    Hip external rotation    Knee flexion 5/5 5/5  Knee extension 5/5 5/5  Ankle dorsiflexion 5/5 5/5  Ankle plantarflexion    Ankle inversion    Ankle eversion     (Blank rows = not tested)  BED MOBILITY:  Findings: independent  TRANSFERS: Sit to stand: Complete Independence  Assistive device utilized: None      STAIRS: Reports independent at home -- husband still providing supervision GAIT: Findings: Gait Characteristics: WFL, Distance walked: 300 ft, Level of assistance: Complete Independence  FUNCTIONAL TESTS:  5 times sit to stand: 9.77 seconds Berg Balance Scale: 56/56 Functional gait assessment: 28/30 . PATIENT SURVEYS:  Not tested due to patient's memory                                                                                                                 [facility] Adult PT Treatment:                                                DATE: 11/21/23 Neuromuscular re-ed: See HEP--  PATIENT EDUCATION: Education details: HEP Person educated: Patient Education method: Explanation, Demonstration, and Handouts Education comprehension: verbalized understanding and returned demonstration  HOME EXERCISE PROGRAM: Access Code: [email or web address] URL: [email or web address] Date:  11/21/2023 Prepared by: Trygve Gage  Exercises - Walking Tandem Stance  - 1 x daily - 5 x weekly - 1 sets -  10 reps  GOALS: Goals reviewed with patient? Yes  LONG TERM GOALS: Target date: 12/21/23  The patient will be indep with HEP progression. Baseline:  Initiated at eval-- will only need follow up if need arises Goal status: INITIAL  ASSESSMENT:  CLINICAL IMPRESSION: Patient is a 73 y.o. female who was seen today for physical therapy evaluation and treatment s/p cerebral infarction. She was hospitalized and went to IP rehab and d/c home early April. She and her husband have been walking for exercise. At today's visit patient does not show a risk for falls and has good strength and coordination. Her main deficits appear to be related to word finding, and memory. PT established high level balance HEP and recommended continued walking with family.    OBJECTIVE IMPAIRMENTS: decreased balance.   ACTIVITY LIMITATIONS: locomotion level  PARTICIPATION LIMITATIONS:  deficits more related to memory and word finding  PERSONAL FACTORS: 1-2 comorbidities: HTN, neck fusion  are also affecting patient's functional outcome.   REHAB POTENTIAL: Good  CLINICAL DECISION MAKING: Stable/uncomplicated  EVALUATION COMPLEXITY: Low  PLAN:  PT FREQUENCY: 1x/week  PT DURATION: 4 weeks  PLANNED INTERVENTIONS: 97164- PT Re-evaluation, 97110-Therapeutic exercises, 97530- Therapeutic activity, 97112- Neuromuscular re-education, 97535- Self Care, 30865- Manual therapy, [phone]- Gait training, Patient/Family education, Balance training, and Stair training  PLAN FOR NEXT SESSION: do not plan to schedule, but did initiate plan of care so patient can access PT if any needs arise over next 30 days (she will be coming for SLP).   Jasslyn Finkel, PT 11/21/2023, 2:14 PM

## 2023-11-22 ENCOUNTER — Encounter: Payer: Self-pay | Admitting: *Deleted

## 2023-11-22 LAB — POTASSIUM: Potassium: 4.4 mmol/L (ref 3.5–5.2)

## 2023-11-24 ENCOUNTER — Telehealth: Payer: Self-pay | Admitting: Internal Medicine

## 2023-11-24 NOTE — Telephone Encounter (Signed)
 Left message to call back

## 2023-11-24 NOTE — Telephone Encounter (Signed)
 Husband wants a call back regarding patient's potassium and fluid intake.

## 2023-11-27 NOTE — Telephone Encounter (Signed)
 Left a message for the pts/husband to call our office back. Sending to triage.. pt has not seen Dr Avanell Bob... has been followed recently by Dayna Dunn PA.

## 2023-11-27 NOTE — Telephone Encounter (Signed)
 Spoke with patient's spouse [name] (OK per DPR) and shared response from Jeronimo Moors, PA-C:  Covering Dayna's basket while out. I reviewed all of her lab work and all her electrolytes are normal. There was an erroneous high potassium, which has been frequently occurring with [facility]. A repeat value was normal. I think she is fine to continue the electrolyte drink without worry.   KT    [name] verbalized understanding and reports patient says thank you for follow-up.

## 2023-11-27 NOTE — Telephone Encounter (Signed)
 Pts husband called to report that he has been giving the pt electrolytes in her water  daily..;. it is called Ultima Replenisher.  He is worried that this could have contributed to her mild increase in her K even though it has come back down to normal limits.   Pts husband asking that I let Dayna know so that he will feel more comfortable continuing to give it to her since she enjoys drinking it and this is a good waytto keep her hydrated.

## 2023-11-29 ENCOUNTER — Encounter: Payer: Self-pay | Admitting: Family Medicine

## 2023-11-29 ENCOUNTER — Ambulatory Visit (INDEPENDENT_AMBULATORY_CARE_PROVIDER_SITE_OTHER): Admitting: Family Medicine

## 2023-11-29 ENCOUNTER — Ambulatory Visit: Admitting: Occupational Therapy

## 2023-11-29 ENCOUNTER — Ambulatory Visit: Payer: Self-pay

## 2023-11-29 VITALS — BP 112/68 | HR 57 | Temp 97.6°F | Ht 59.0 in | Wt 109.4 lb

## 2023-11-29 DIAGNOSIS — R41840 Attention and concentration deficit: Secondary | ICD-10-CM

## 2023-11-29 DIAGNOSIS — M6281 Muscle weakness (generalized): Secondary | ICD-10-CM

## 2023-11-29 DIAGNOSIS — I69318 Other symptoms and signs involving cognitive functions following cerebral infarction: Secondary | ICD-10-CM

## 2023-11-29 DIAGNOSIS — L03011 Cellulitis of right finger: Secondary | ICD-10-CM | POA: Diagnosis not present

## 2023-11-29 MED ORDER — AMOXICILLIN-POT CLAVULANATE 500-125 MG PO TABS: 1.0000 | ORAL_TABLET | Freq: Two times a day (BID) | ORAL | 0 refills | Status: AC

## 2023-11-29 NOTE — Telephone Encounter (Signed)
  Chief Complaint: right thumb pain  Symptoms: swollen, red Disposition: [] ED /[] Urgent Care (no appt availability in office) / [x] Appointment(In office/virtual)/ []  [facility]/ [] Home Care/ [] Refused Recommended Disposition /[] [facility]/ []  Follow-up with PCP Additional Notes: Pt has concerns of right thumb that is painful, swollen and red. Pt rated pain "12." Symptoms started 3 days. Cause unknown. Pt had stroke in March and goes to rehab. Pt has not taken any medication for pain. RN advised pt to take aleve and ice thumb on way to rehab appt. Pt has appt today at 1430. RN gave care advice and pt/husband verbalized understanding.            Copied from CRM [id number]. Topic: Clinical - Red Word Triage >> Nov 29, 2023 12:15 PM Shardie S wrote: [facility] that prompted transfer to Nurse Triage: rt thumb- swelling, pain, redness Reason for Disposition  [1] SEVERE pain (e.g., excruciating) AND [2] not improved after 2 hours of pain medicine  Answer Assessment - Initial Assessment Questions 1. ONSET: "When did the pain start?"      3 days  2. LOCATION and RADIATION: "Where is the pain located?"  (e.g., fingertip, around nail, joint, entire  finger)      Right thumb  3. SEVERITY: "How bad is the pain?" "What does it keep you from doing?"   (Scale 1-10; or mild, moderate, severe)  - MILD (1-3): doesn't interfere with normal activities.   - MODERATE (4-7): interferes with normal activities or awakens from sleep.  - SEVERE (8-10): excruciating pain, unable to hold a glass of water  or bend finger even a little.     12 4. APPEARANCE: "What does the finger look like?" (e.g., redness, swelling, bruising, pallor)     Red, swollen   6. CAUSE: "What do you think is causing the pain?"     Not sure  7. AGGRAVATING FACTORS: "What makes the pain worse?" (e.g., using computer)     Touching  8. OTHER SYMPTOMS: "Do you have any other symptoms?" (e.g., fever, neck pain,  numbness)     Denies  Protocols used: Finger Pain-A-AH

## 2023-11-29 NOTE — Patient Instructions (Signed)
 Vision Compensation Strategies Look for the edge of objects (to the left and/or right) so that you make sure you are seeing all of an object  Turn your head when walking, scan from side to side, particularly in busy environments   Use an organized scanning pattern. It's usually easier to scan from top to bottom, and left to right (like you are reading)  Double check yourself   Use a line guide (like a blank piece of paper) or your finger when reading  If necessary, place brightly colored tape at end of table or work area as a reminder to always look until you see the tape.       Activities to try at home to encourage visual scanning:   1. Word searches 2. Mazes 3. Puzzles 4. Card games 5. Computer games and/or searches 6. Connect-the-dots  Activities for environmental (larger) scanning:  1. With supervision, scan for items in grocery store or drugstore. Begin with a familiar store, then progress to a new store you've never been in before. Make sure you have supervision with this.   2. With supervision, tell a family member or caregiver when it is safe to cross a street after looking all directions and any side streets. However, do NOT cross street unless family member or caregiver is with you and says it is [place]

## 2023-11-29 NOTE — Progress Notes (Signed)
 Established Patient Office Visit   Subjective  Patient ID: Gabrielle Vargas, female    DOB: 1951/04/27  Age: 73 y.o. MRN: [id number]  Chief Complaint  Patient presents with   Hand Pain    Right finger pain     The patient is a 73 year old female followed by [name], NP and seen for acute concern.  Patient endorses edema, throbbing pain 10/10 of right thumb x 3 days.  Symptoms started Sunday out of the blue.  Patient denies injury.  Uncomfortable to bend thumb.  Gets nails done at salon.  Last done 2 weeks ago.  Acrylic polish in place.    Patient Active Problem List   Diagnosis Date Noted   NSVT (nonsustained ventricular tachycardia) (HCC) 10/12/2023   CVA (cerebral vascular accident) (HCC) 10/12/2023   Acute combined systolic and diastolic heart failure (HCC) 10/11/2023   HFrEF (heart failure with reduced ejection fraction) (HCC) 10/10/2023   Acute ischemic stroke (HCC) 10/08/2023   MCI (mild cognitive impairment) 08/04/2022   Vitamin B12 deficiency    Osteoarthritis 08/03/2022   Hyperlipidemia 08/03/2022   History of COVID-19 2021   Essential hypertension 10/09/2017   Low back pain radiating to right leg 10/09/2017   Mild intermittent asthma with acute exacerbation 05/30/2017   S/P arthroscopy of right knee 06/03/2014   Right knee pain 03/13/2014   Diverticulitis of colon (without mention of hemorrhage) 09/17/2013   Postmenopausal atrophic vaginitis, severe 08/18/2008   Hypothyroidism 05/09/2007   ADHD (attention deficit hyperactivity disorder) 05/09/2007   Past Medical History:  Diagnosis Date   ADHD (attention deficit hyperactivity disorder) 05/09/2007   Diverticulitis of colon (without mention of hemorrhage) 09/17/2013   Essential hypertension 10/09/2017   History of COVID-19 2021   Hyperlipidemia    Hypothyroidism    Low back pain radiating to right leg 10/09/2017   Mild cognitive impairment of uncertain or unknown etiology 08/04/2022   Mild  intermittent asthma with acute exacerbation 05/30/2017   Osteoarthritis    Postmenopausal atrophic vaginitis, severe 08/18/2008   Right knee pain 03/13/2014   S/P arthroscopy of right knee 06/03/2014   Vitamin B12 deficiency    Past Surgical History:  Procedure Laterality Date   ABDOMINAL HYSTERECTOMY     BREAST SURGERY     CARPAL TUNNEL RELEASE Bilateral    CERVICAL DISC ARTHROPLASTY     CESAREAN SECTION     x2   left shoulder surgery     LOOP RECORDER INSERTION N/A 10/12/2023   Procedure: LOOP RECORDER INSERTION;  Surgeon: Boyce Byes, MD;  Location: [facility];  Service: Cardiovascular;  Laterality: N/A;   MENISCUS REPAIR Right 2017   REDUCTION MAMMAPLASTY Bilateral    right shoulder     TONSILLECTOMY     Social History   Tobacco Use   Smoking status: Never   Smokeless tobacco: Never  Vaping Use   Vaping status: Never Used  Substance Use Topics   Alcohol use: No   Drug use: No   Family History  Problem Relation Age of Onset   Melanoma Mother    Kidney disease Father    Diabetes Father    Heart disease Father    Breast cancer Paternal Grandmother    ADD / ADHD Other        multiple family members   Colon cancer Neg Hx    Esophageal cancer Neg Hx    Rectal cancer Neg Hx    Stomach cancer Neg Hx    Allergies  Allergen Reactions   Ativan  [Lorazepam ]     Agitation/paradoxical reaction.   Oxycodone-Acetaminophen  Nausea Only   Latex Hives and Rash    ROS Negative unless stated above    Objective:      BP 112/68 (BP Location: Left Arm, Patient Position: Sitting, Cuff Size: Normal)   Pulse (!) 57   Temp 97.6 F (36.4 C) (Oral)   Ht 4\' 11"  (1.499 m)   Wt 109 lb 6.4 oz (49.6 kg)   SpO2 97%   BMI 22.10 kg/m  BP Readings from Last 3 Encounters:  11/29/23 112/68  11/13/23 118/78  10/31/23 123/68   Wt Readings from Last 3 Encounters:  11/29/23 109 lb 6.4 oz (49.6 kg)  11/13/23 110 lb (49.9 kg)  10/31/23 108 lb 1.6 oz (49 kg)       Physical Exam Constitutional:      Appearance: Normal appearance.  HENT:     Head: Normocephalic and atraumatic.     Mouth/Throat:     Mouth: Mucous membranes are moist.  Cardiovascular:     Rate and Rhythm: Normal rate.  Pulmonary:     Effort: Pulmonary effort is normal.  Skin:    General: Skin is warm and dry.     Findings: Erythema present.     Comments: Erythema, significant edema, Tenderness of R thumb with tracking of erythema to lateral R wrist/proximal anatomic snuffbox.  Bluish discoloration of nail bed underneath acrylic polish.  No pustules noted.  Neurological:     Mental Status: She is alert.          No results found for any visits on 11/29/23.    Assessment & Plan:   Cellulitis of right thumb -     Amoxicillin-Pot Clavulanate; Take 1 tablet by mouth in the morning and at bedtime for 10 days.  Dispense: 20 tablet; Refill: 0  Cellulitis right thumb.  Start ABX.  Discussed signs and symptoms of worsening infection.  Patient to notify clinic on Friday for worsening symptoms.  Elevation, ice, NSAIDs/Tylenol  as needed.  Has follow-up with PCP next week.  Return if symptoms worsen or fail to improve.   Viola Greulich, MD

## 2023-11-29 NOTE — Telephone Encounter (Signed)
 Agree

## 2023-11-29 NOTE — Telephone Encounter (Signed)
 Noted.

## 2023-11-29 NOTE — Therapy (Addendum)
 OUTPATIENT OCCUPATIONAL THERAPY NEURO Treatment  [name] MRN: [id number] DOB:Sep 01, 1950, 73 y.o., female Today's Date: 11/29/2023  PCP: [name], NP REFERRING PROVIDER: [name], MD  END OF SESSION:  OT End of Session - 11/29/23 1551     Visit Number 2    Number of Visits 11    Date for OT Re-Evaluation 01/05/24    Authorization Type [employer]    OT Start Time 1319    OT Stop Time 1405    OT Time Calculation (min) 46 min    Activity Tolerance Patient tolerated treatment well;Patient limited by pain   RUE thumb pain and swelling   Behavior During Therapy [facility] for tasks assessed/performed              Past Medical History:  Diagnosis Date   ADHD (attention deficit hyperactivity disorder) 05/09/2007   Diverticulitis of colon (without mention of hemorrhage) 09/17/2013   Essential hypertension 10/09/2017   History of COVID-19 2021   Hyperlipidemia    Hypothyroidism    Low back pain radiating to right leg 10/09/2017   Mild cognitive impairment of uncertain or unknown etiology 08/04/2022   Mild intermittent asthma with acute exacerbation 05/30/2017   Osteoarthritis    Postmenopausal atrophic vaginitis, severe 08/18/2008   Right knee pain 03/13/2014   S/P arthroscopy of right knee 06/03/2014   Vitamin B12 deficiency    Past Surgical History:  Procedure Laterality Date   ABDOMINAL HYSTERECTOMY     BREAST SURGERY     CARPAL TUNNEL RELEASE Bilateral    CERVICAL DISC ARTHROPLASTY     CESAREAN SECTION     x2   left shoulder surgery     LOOP RECORDER INSERTION N/A 10/12/2023   Procedure: LOOP RECORDER INSERTION;  Surgeon: Boyce Byes, MD;  Location: [facility];  Service: Cardiovascular;  Laterality: N/A;   MENISCUS REPAIR Right 2017   REDUCTION MAMMAPLASTY Bilateral    right shoulder     TONSILLECTOMY     Patient Active Problem List   Diagnosis Date Noted   NSVT (nonsustained ventricular tachycardia)  (HCC) 10/12/2023   CVA (cerebral vascular accident) (HCC) 10/12/2023   Acute combined systolic and diastolic heart failure (HCC) 10/11/2023   HFrEF (heart failure with reduced ejection fraction) (HCC) 10/10/2023   Acute ischemic stroke (HCC) 10/08/2023   MCI (mild cognitive impairment) 08/04/2022   Vitamin B12 deficiency    Osteoarthritis 08/03/2022   Hyperlipidemia 08/03/2022   History of COVID-19 2021   Essential hypertension 10/09/2017   Low back pain radiating to right leg 10/09/2017   Mild intermittent asthma with acute exacerbation 05/30/2017   S/P arthroscopy of right knee 06/03/2014   Right knee pain 03/13/2014   Diverticulitis of colon (without mention of hemorrhage) 09/17/2013   Postmenopausal atrophic vaginitis, severe 08/18/2008   Hypothyroidism 05/09/2007   ADHD (attention deficit hyperactivity disorder) 05/09/2007    ONSET DATE: 10/08/23  REFERRING DIAG: I63.9 (ICD-10-CM) - Cerebral infarction, unspecified  THERAPY DIAG:  Attention and concentration deficit  Other symptoms and signs involving cognitive functions following cerebral infarction  Muscle weakness (generalized)  Rationale for Evaluation and Treatment: Rehabilitation  SUBJECTIVE:   SUBJECTIVE STATEMENT: Pt pointed out swollen R thumb and reported pain. Pt and spouse do not recall pt hitting thumb in any way or being in contact with anything with which pt may be allergic. Pt reported limited in activity d/t R thumb pain. Pt reported seeing PCP after OT visit today to address R  thumb.   Pt's spouse reported pt still "drifting to left" when walking and pt feeling dizzy and unstable x2 episodes. Pt denied symptoms of dizziness currently. Pt's spouse reported pt now tolerating 30 minutes of walking per day.    Pt accompanied by: self and spouse  PERTINENT HISTORY: ADHD, HTN, diverticulitis, HLD, hypothyroidism, low back pain, neck fusion, mild cognitive impairment, OA in R knee, asthma   presented to  [facility] ED on 10/08/2023 with sudden onset of left facial droop, left-sided weakness,and L visual cut. MRI: Multiple punctate acute infarcts bilateral frontal and left parietal lobes. EEG suggestive of moderate diffuse encephalopathy.   PRECAUTIONS: Fall  WEIGHT BEARING RESTRICTIONS: No  PAIN:  Are you having pain? "12 out of 10," throbbing pain, R thumb.  FALLS: Has patient fallen in last 6 months? No  LIVING ENVIRONMENT: Lives with: lives with their spouse Lives in: House/apartment Stairs: Yes: Internal: bedroom/bathroom on main floor, does have a full flight of steps - spouse's office is upstairs  steps; one rail and External: 2 steps;   Has following equipment at home: shower chair and hand held shower head  PLOF: Independent, Independent with basic ADLs, and Leisure: walking up to 4-6 hours per day  PATIENT GOALS: "I really don't know"  OBJECTIVE:  Note: Objective measures were completed at Evaluation unless otherwise noted.  HAND DOMINANCE: Right  ADLs: Overall ADLs: Mod I with bathing and dressing, pt's spouse will go in and out of bathroom while pt in shower providing distant supervision to Mod I level Transfers/ambulation related to ADLs: Mod I without AD, getting into high bed without any issue  IADLs: Shopping: pt gets overwhelmed with everything that is going on, easily distracted  Meal Prep: spouse is doing most of the cooking right now as pt forgetful with sequencing and ingredients, and easily distracted Community mobility: not cleared to drive at this time Medication management: spouse is assisting now, he will fill up a daily pill box for pt   MOBILITY STATUS: Mod I without AD  POSTURE COMMENTS:  No Significant postural limitations  ACTIVITY TOLERANCE: Activity tolerance: pt reports worn out after 30 min walk (where as they would walk up to 2 hrs at a time before stroke).  Pt is not sleeping as much during the day time.  FUNCTIONAL OUTCOME MEASURES: 9 hole  peg test: Right: 36.54 sec; Left: 34.06 sec Trail Making: 42.47 sec for Trail A   UPPER EXTREMITY ROM:  WFL bilaterally  UPPER EXTREMITY MMT:     MMT Right eval Left eval  Shoulder flexion 4+ 4-  Shoulder abduction    Shoulder adduction    Shoulder extension    Shoulder internal rotation    Shoulder external rotation    Middle trapezius    Lower trapezius    Elbow flexion 4 4  Elbow extension 4- 4-  Wrist flexion    Wrist extension    Wrist ulnar deviation    Wrist radial deviation    Wrist pronation    Wrist supination    (Blank rows = not tested)  COORDINATION: 9 Hole Peg test: Right: 36.54 sec; Left: 34.06 sec Box and Blocks:  Right 43 blocks, Left 36blocks - Pt requiring cues x3 to remember to only pick up one block at a time when completing with Right; requiring cues to go over barrier instead of around (possibly slowing down pace even more).  SENSATION: WFL  COGNITION: Overall cognitive status: Impaired and History of cognitive impairments - at baseline  Pt able to recall 3 words: sock blue bed after 5 mins  Pt with difficulty with word finding and memory  VISION: Subjective report: wears glasses, reported f/u with neurology and ophthalmologist who report to wait for further assessment to allow recovery s/p CVA Baseline vision: Wears glasses all the time  VISION ASSESSMENT: To be further assessed in functional context Eye alignment: WFL Ocular ROM: WFL Gaze preference/alignment: WDL Tracking/Visual pursuits: TBA Visual Fields: TBA in functional context   Bell cancellation test: locating 32/35 in 3:00, pt then able to locate remaining 2/3 in 4:20 before stating "I think that is all"  PERCEPTION: Impaired  PRAXIS: Impaired: Initiation, Motor planning, and Organization  OBSERVATIONS: Difficult to assess vision, attention, and strength as pt requiring increased cueing and repeat directions.  Unsure full attention and effort during MMT this session.  Pt  forgetting instructions during coordination assessment, requiring wait time for recall and/or additional cues.                                                                                                                             TREATMENT DATE:   Self-Care OT assessed pt's R thumb and noted severe swelling at R thumb and IP joint with redness. IP ROM impaired. Thin line of redness noted extending from thumb tip to approx. halfway up lateral forearm. Pt able to tolerate light touch over thumb. Pt reported sometimes "itchiness" along line of redness. Pt and spouse reported unsure of reason for swelling. Pt's spouse reported pt took ibuprofen and used ice, and pt reported these did not seem to help. OT highly recommended to f/u with PCP and educated on s/s of infection. Pt and spouse acknowledged understanding.   OT also recommended to pt and spouse to f/u with PCP about episodes of blurry vision which align with dizziness. OT questioning pt's BP during these episodes and pt and spouse reported unsure of BP d/t episodes usually occur in community. OT recommended to monitor when episodes occur and note events leading up to episodes to monitor for patterns. Pt and spouse acknowledged understanding.   Pt expressed concerns about wanting to attend a social event though pt's spouse expressed concerns about exposure to potential illnesses if seeing large numbers of people. OT educated pt on balancing socialization while staying safe and healthy. Pt and spouse acknowledged understanding.  OT educated pt and spouse on symptoms of low and high BP, vision compensation strategies and activities for visual scanning. Handout provided for vision information, see pt instructions. Pt and spouse acknowledged understanding.    TherAct Letter cancellation activity using nondominant affected LUE to make marks on page (d/t swelling of R thumb) - first trial without line guide, second trial with line guide - to improve  visual scanning, sequencing, sustained attention to task, FM coordination of LUE. Trial 1: Pt completed page in clockwise direction (R half of page (top-down) before progressing to L half of page (bottom-to-top)). Pt identified 100% of  letters ind. Trial 2 - Pt identified 100% of letter ind and ind demo'd understanding of line guide. Noted that pt scanned right to left.  Puzzle task, 12-piece puzzle - for visual scanning, pattern recogntion, sequencing, sustained attention, and FM coordination of BUE. Pt ind demo'd effective strategy of identifying corners first.     PATIENT EDUCATION: Education details: see today's tx above Person educated: Patient and Spouse Education method: Explanation and Handouts Education comprehension: verbalized understanding and needs further education  HOME EXERCISE PROGRAM: Provided written suggestions for keeping thinking skills sharp - plan to provide printout at future session   GOALS: Goals reviewed with patient? Yes  SHORT TERM GOALS: Target date: 12/15/23  Pt will be independent with coordination HEP with visual handouts for recall. Baseline: new to OPOT Goal status: INITIAL  2.  Pt will demonstrate understanding of memory compensations and ways to keep thinking skills sharp Baseline: STM deficits and impaired initiation Goal status: INITIAL  3.  Pt will demonstrate and/or verbalize understanding of visual attention exercises and compensatory strategies for improved visual scanning and attention. Baseline: left field cut Goal status: INITIAL  4.  Pt will demonstrate improved coordination, attention to task, and recall of instructions by completing box and blocks assessment with improved score by 6 blocks bilaterally. Baseline: Right 43 blocks, Left 36blocks - Pt requiring cues x3 to remember to only pick up one block at a time when completing with Right; requiring cues to go over barrier instead of around (possibly slowing down pace even  more). Goal status: INITIAL   LONG TERM GOALS: Target date: 01/05/24  Pt will verbalize understanding of task modifications, adaptive strategies, and/or potential AE needs to increase ease, safety, and independence w/ IADLs. Baseline: decreased engagement in IADLs due to impaired memory and attention Goal status: INITIAL  2.  Pt will complete simulated medication management activity w/ Supervision by discharge, incorporating compensatory strategies/AE prn Baseline: spouse managing meds at this time, was not prior to CVA Goal status: INITIAL  3.  Pt will demonstrate ability to sequence simple functional task (simple snack prep, laundry task, etc) at Mod I level with good safety awareness and ability to utilize memory strategies for recall. Baseline:  Goal status: INITIAL  4.  Pt will navigate a moderately busy environment, completing dual task activity and/or following multi-step commands with 90% accuracy Baseline:  Goal status: INITIAL  5.  Pt will demonstrate improved initiation and sequencing to be able to navigate use of personal cell phone and send text messages in <15 mins. Baseline: spouse reporting 45 mins to reply to test message Goal status: INITIAL   ASSESSMENT:  CLINICAL IMPRESSION: Pt tolerated tasks well. Recommended to continue to monitor R thumb d/t new swelling and pain with unknown causation. Pt intends to f/u with PCP later today to address R thumb symptoms. Pt demo'ing somewhat atypical scanning patterns for visual scanning tasks (clockwise and R to L, usually beginning at R side of page) though effectively identifying 100% of letters during letter cancellation activity ind and completing 12-piece puzzle with effective sequencing strategies and extra time.  Pt will benefit from skilled occupational therapy services to address strength and coordination, GM/FM control, cognition, safety awareness, introduction of compensatory strategies/AE prn, visual-perception, and  implementation of an HEP to improve participation and safety during ADLs and IADLs.    PERFORMANCE DEFICITS: in functional skills including ADLs, IADLs, coordination, strength, Fine motor control, Gross motor control, endurance, decreased knowledge of precautions, vision, and UE functional use, cognitive  skills including attention, energy/drive, memory, problem solving, safety awareness, and sequencing, and psychosocial skills including environmental adaptation and routines and behaviors.   IMPAIRMENTS: are limiting patient from ADLs, IADLs, leisure, and social participation.   CO-MORBIDITIES: may have co-morbidities  that affects occupational performance. Patient will benefit from skilled OT to address above impairments and improve overall function.  MODIFICATION OR ASSISTANCE TO COMPLETE EVALUATION: No modification of tasks or assist necessary to complete an evaluation.  OT OCCUPATIONAL PROFILE AND HISTORY: Problem focused assessment: Including review of records relating to presenting problem.  CLINICAL DECISION MAKING: LOW - limited treatment options, no task modification necessary  REHAB POTENTIAL: Good  EVALUATION COMPLEXITY: Low    PLAN:  OT FREQUENCY: 1-2x/week  OT DURATION: 6 weeks  PLANNED INTERVENTIONS: 97168 OT Re-evaluation, 97535 self care/ADL training, 16109 therapeutic exercise, 97530 therapeutic activity, 97112 neuromuscular re-education, functional mobility training, visual/perceptual remediation/compensation, psychosocial skills training, energy conservation, coping strategies training, patient/family education, and DME and/or AE instructions  RECOMMENDED OTHER SERVICES: SLP  CONSULTED AND AGREED WITH PLAN OF CARE: Patient and family member/caregiver  PLAN FOR NEXT SESSION:  educate on memory strategies and keeping thinking skills sharp  Cognitive/motor dual tasking, sequencing, recall of 2 step directions  Phone use  Further assess during functional tasks,  review PRN visual compensatory strategies (especially during functional tasks)  Visual scanning tasks  How is R thumb? - swollen, red, painful at most recent session with unknown causation. Pt followed up with PCP after OT appointment   Oakley Bellman, OTR/L 11/29/2023, 4:01 PM  [facility] at [facility] [address] [place], [address] [place], [place] [address] Phone # [phone] Fax # [phone]

## 2023-12-05 ENCOUNTER — Ambulatory Visit (INDEPENDENT_AMBULATORY_CARE_PROVIDER_SITE_OTHER): Payer: PPO | Admitting: Adult Health

## 2023-12-05 ENCOUNTER — Encounter: Payer: Self-pay | Admitting: Adult Health

## 2023-12-05 VITALS — BP 110/70 | HR 84 | Temp 97.5°F | Ht 59.0 in | Wt 109.0 lb

## 2023-12-05 DIAGNOSIS — I10 Essential (primary) hypertension: Secondary | ICD-10-CM

## 2023-12-05 DIAGNOSIS — L03011 Cellulitis of right finger: Secondary | ICD-10-CM

## 2023-12-05 DIAGNOSIS — R7303 Prediabetes: Secondary | ICD-10-CM

## 2023-12-05 LAB — POCT GLYCOSYLATED HEMOGLOBIN (HGB A1C): Hemoglobin A1C: 6 % — AB (ref 4.0–5.6)

## 2023-12-05 NOTE — Progress Notes (Signed)
 Subjective:    Patient ID: Gabrielle Vargas, female    DOB: Nov 03, 1950, 73 y.o.   MRN: [id number]  HPI 73 year old female who  has a past medical history of ADHD (attention deficit hyperactivity disorder) (05/09/2007), Diverticulitis of colon (without mention of hemorrhage) (09/17/2013), Essential hypertension (10/09/2017), History of COVID-19 (2021), Hyperlipidemia, Hypothyroidism, Low back pain radiating to right leg (10/09/2017), Mild cognitive impairment of uncertain or unknown etiology (08/04/2022), Mild intermittent asthma with acute exacerbation (05/30/2017), Osteoarthritis, Postmenopausal atrophic vaginitis, severe (08/18/2008), Right knee pain (03/13/2014), S/P arthroscopy of right knee (06/03/2014), and Vitamin B12 deficiency.  She presents to the office today for follow up regarding Prediabetes and HTN   Prediabetes  - not currently on medication. She was on metformin  until her recent hospital admission for CVA at which time Metformin  was d/c.  Lab Results  Component Value Date   HGBA1C 6.0 (A) 12/05/2023   HGBA1C 6.2 09/05/2023   HGBA1C 5.8 (A) 03/07/2023   Wt Readings from Last 3 Encounters:  12/05/23 109 lb (49.4 kg)  11/29/23 109 lb 6.4 oz (49.6 kg)  11/13/23 110 lb (49.9 kg)   Hypertension - managed with Toprol  XL 25 mg daily and losartan  12.5 mg daily. She has been monitoring her BP at home with readings in the 102-120 range systolic. She denies dizziness, lightheadedness or blurred vision.  BP Readings from Last 3 Encounters:  12/05/23 110/70  11/29/23 112/68  11/13/23 118/78   Cellulitis of right thumb -she was seen 6 days ago by another provider for cheilitis of her right thumb.  She was placed on Augmentin  twice daily x 10 days.  Today she reports that she has been soaking her thumb in warm Epsom salt twice daily and taking her antibiotics as directed.  The redness, swelling has greatly decreased.    Review of Systems See HPI   Past Medical History:   Diagnosis Date   ADHD (attention deficit hyperactivity disorder) 05/09/2007   Diverticulitis of colon (without mention of hemorrhage) 09/17/2013   Essential hypertension 10/09/2017   History of COVID-19 2021   Hyperlipidemia    Hypothyroidism    Low back pain radiating to right leg 10/09/2017   Mild cognitive impairment of uncertain or unknown etiology 08/04/2022   Mild intermittent asthma with acute exacerbation 05/30/2017   Osteoarthritis    Postmenopausal atrophic vaginitis, severe 08/18/2008   Right knee pain 03/13/2014   S/P arthroscopy of right knee 06/03/2014   Vitamin B12 deficiency     Social History   Socioeconomic History   Marital status: Married    Spouse name: Not on file   Number of children: 2   Years of education: 13   Highest education level: Some college, no degree  Occupational History   Occupation: Retired  Tobacco Use   Smoking status: Never   Smokeless tobacco: Never  Vaping Use   Vaping status: Never Used  Substance and Sexual Activity   Alcohol use: No   Drug use: No   Sexual activity: Not on file  Other Topics Concern   Not on file  Social History Narrative   Retired   Married   2 children   Right handed   Live with husband   Social Drivers of Corporate investment banker Strain: Low Risk  (12/23/2022)   Overall Financial Resource Strain (CARDIA)    Difficulty of Paying Living Expenses: Not hard at all  Food Insecurity: Patient Unable To Answer (10/09/2023)   Hunger Vital Sign  Worried About Programme researcher, broadcasting/film/video in the Last Year: Patient unable to answer    Ran Out of Food in the Last Year: Patient unable to answer  Transportation Needs: Patient Unable To Answer (10/09/2023)   PRAPARE - Transportation    Lack of Transportation (Medical): Patient unable to answer    Lack of Transportation (Non-Medical): Patient unable to answer  Physical Activity: Sufficiently Active (12/23/2022)   Exercise Vital Sign    Days of Exercise per Week: 7  days    Minutes of Exercise per Session: 60 min  Stress: No Stress Concern Present (12/23/2022)   [employer] of Occupational Health - Occupational Stress Questionnaire    Feeling of Stress : Not at all  Social Connections: Patient Unable To Answer (10/09/2023)   Social Connection and Isolation Panel [NHANES]    Frequency of Communication with Friends and Family: Patient unable to answer    Frequency of Social Gatherings with Friends and Family: Patient unable to answer    Attends Religious Services: Patient unable to answer    Active Member of Clubs or Organizations: Patient unable to answer    Attends Banker Meetings: Patient unable to answer    Marital Status: Patient unable to answer  Intimate Partner Violence: Patient Unable To Answer (10/09/2023)   Humiliation, Afraid, Rape, and Kick questionnaire    Fear of Current or Ex-Partner: Patient unable to answer    Emotionally Abused: Patient unable to answer    Physically Abused: Patient unable to answer    Sexually Abused: Patient unable to answer    Past Surgical History:  Procedure Laterality Date   ABDOMINAL HYSTERECTOMY     BREAST SURGERY     CARPAL TUNNEL RELEASE Bilateral    CERVICAL DISC ARTHROPLASTY     CESAREAN SECTION     x2   left shoulder surgery     LOOP RECORDER INSERTION N/A 10/12/2023   Procedure: LOOP RECORDER INSERTION;  Surgeon: Boyce Byes, MD;  Location: [facility];  Service: Cardiovascular;  Laterality: N/A;   MENISCUS REPAIR Right 2017   REDUCTION MAMMAPLASTY Bilateral    right shoulder     TONSILLECTOMY      Family History  Problem Relation Age of Onset   Melanoma Mother    Kidney disease Father    Diabetes Father    Heart disease Father    Breast cancer Paternal Grandmother    ADD / ADHD Other        multiple family members   Colon cancer Neg Hx    Esophageal cancer Neg Hx    Rectal cancer Neg Hx    Stomach cancer Neg Hx     Allergies  Allergen Reactions    Ativan  [Lorazepam ]     Agitation/paradoxical reaction.   Oxycodone-Acetaminophen  Nausea Only   Latex Hives and Rash    Current Outpatient Medications on File Prior to Visit  Medication Sig Dispense Refill   Acetylcysteine (NAC) 500 MG CAPS Take 500 mg by mouth daily.     amoxicillin -clavulanate (AUGMENTIN ) 500-125 MG tablet Take 1 tablet by mouth in the morning and at bedtime for 10 days. 20 tablet 0   amphetamine -dextroamphetamine  (ADDERALL) 10 MG tablet Take 1 tablet (10 mg total) by mouth daily with breakfast. This dose is decreased as before admission. 30 tablet 0   Ascorbic Acid (VITAMIN C) 1000 MG tablet Take 1,000 mg by mouth daily.     aspirin  81 MG chewable tablet Chew 1 tablet (81 mg  total) by mouth daily. 100 tablet 0   atorvastatin  (LIPITOR) 40 MG tablet Take 1 tablet (40 mg total) by mouth daily. 90 tablet 3   Bioflavonoid Products (QUERCETIN COMPLEX IMMUNE PO) Take 500 mg by mouth daily.     Cholecalciferol (D3-50 PO) Take 1 capsule by mouth daily.     Evening Primrose Oil 1000 MG CAPS Take 1,000 mg by mouth in the morning and at bedtime.     ezetimibe  (ZETIA ) 10 MG tablet Take 1 tablet (10 mg total) by mouth at bedtime. 90 tablet 3   losartan  (COZAAR ) 25 MG tablet Take 0.5 tablets (12.5 mg total) by mouth daily. 45 tablet 3   metoprolol  succinate (TOPROL -XL) 25 MG 24 hr tablet Take 1 tablet (25 mg total) by mouth daily. 90 tablet 3   SYNTHROID  75 MCG tablet Take 1 tablet (75 mcg total) by mouth daily. 90 tablet 3   vitamin B-12 (CYANOCOBALAMIN ) 1000 MCG tablet Take 1,000 mcg by mouth 2 (two) times a week.     Zinc 20 MG CAPS Take 20 mg by mouth daily.     No current facility-administered medications on file prior to visit.    BP 110/70   Pulse 84   Temp (!) 97.5 F (36.4 C) (Oral)   Ht 4\' 11"  (1.499 m)   Wt 109 lb (49.4 kg)   SpO2 (!) 84% Comment: nails  BMI 22.02 kg/m       Objective:   Physical Exam Vitals and nursing note reviewed.  Constitutional:       Appearance: Normal appearance.  Cardiovascular:     Rate and Rhythm: Normal rate and regular rhythm.     Pulses: Normal pulses.     Heart sounds: Normal heart sounds.  Pulmonary:     Breath sounds: Normal breath sounds.  Musculoskeletal:        General: Normal range of motion.  Skin:    General: Skin is warm and dry.     Findings: Wound present.  Neurological:     General: No focal deficit present.     Mental Status: She is alert and oriented to person, place, and time.  Psychiatric:        Mood and Affect: Mood normal.        Behavior: Behavior normal.        Thought Content: Thought content normal.        Judgment: Judgment normal.        Assessment & Plan:  1. Prediabetes (Primary)  - POC HgB A1c- 6.0  - Follow up in 3 months  - Continue to stay active and eat healthy   2. Primary hypertension - Controlled. No change in medication   3. Cellulitis of right thumb - Appears to be resolving. Continue with abx and follow up if signs of infection are still present after finishing ABX  Branson Kranz, NP

## 2023-12-05 NOTE — Patient Instructions (Addendum)
 Gabrielle Vargas

## 2023-12-07 ENCOUNTER — Ambulatory Visit

## 2023-12-07 ENCOUNTER — Ambulatory Visit: Admitting: Occupational Therapy

## 2023-12-07 ENCOUNTER — Other Ambulatory Visit: Payer: Self-pay

## 2023-12-07 DIAGNOSIS — R41840 Attention and concentration deficit: Secondary | ICD-10-CM

## 2023-12-07 DIAGNOSIS — R41841 Cognitive communication deficit: Secondary | ICD-10-CM

## 2023-12-07 DIAGNOSIS — R4701 Aphasia: Secondary | ICD-10-CM

## 2023-12-07 DIAGNOSIS — I69318 Other symptoms and signs involving cognitive functions following cerebral infarction: Secondary | ICD-10-CM

## 2023-12-07 NOTE — Therapy (Signed)
 OUTPATIENT OCCUPATIONAL THERAPY NEURO Treatment  [name] MRN: [id number] DOB:06/21/1951, 73 y.o., female Today's Date: 12/07/2023  PCP: [name], NP REFERRING PROVIDER: [name], MD  END OF SESSION:  OT End of Session - 12/07/23 1631     Visit Number 3    Number of Visits 11    Date for OT Re-Evaluation 01/05/24    Authorization Type [employer]    OT Start Time 1535    OT Stop Time 1616    OT Time Calculation (min) 41 min    Activity Tolerance Patient tolerated treatment well;Patient limited by pain   RUE thumb pain and swelling   Behavior During Therapy [facility] for tasks assessed/performed               Past Medical History:  Diagnosis Date   ADHD (attention deficit hyperactivity disorder) 05/09/2007   Diverticulitis of colon (without mention of hemorrhage) 09/17/2013   Essential hypertension 10/09/2017   History of COVID-19 2021   Hyperlipidemia    Hypothyroidism    Low back pain radiating to right leg 10/09/2017   Mild cognitive impairment of uncertain or unknown etiology 08/04/2022   Mild intermittent asthma with acute exacerbation 05/30/2017   Osteoarthritis    Postmenopausal atrophic vaginitis, severe 08/18/2008   Right knee pain 03/13/2014   S/P arthroscopy of right knee 06/03/2014   Vitamin B12 deficiency    Past Surgical History:  Procedure Laterality Date   ABDOMINAL HYSTERECTOMY     BREAST SURGERY     CARPAL TUNNEL RELEASE Bilateral    CERVICAL DISC ARTHROPLASTY     CESAREAN SECTION     x2   left shoulder surgery     LOOP RECORDER INSERTION N/A 10/12/2023   Procedure: LOOP RECORDER INSERTION;  Surgeon: Boyce Byes, MD;  Location: [facility];  Service: Cardiovascular;  Laterality: N/A;   MENISCUS REPAIR Right 2017   REDUCTION MAMMAPLASTY Bilateral    right shoulder     TONSILLECTOMY     Patient Active Problem List   Diagnosis Date Noted   NSVT (nonsustained ventricular  tachycardia) (HCC) 10/12/2023   CVA (cerebral vascular accident) (HCC) 10/12/2023   Acute combined systolic and diastolic heart failure (HCC) 10/11/2023   HFrEF (heart failure with reduced ejection fraction) (HCC) 10/10/2023   Acute ischemic stroke (HCC) 10/08/2023   MCI (mild cognitive impairment) 08/04/2022   Vitamin B12 deficiency    Osteoarthritis 08/03/2022   Hyperlipidemia 08/03/2022   History of COVID-19 2021   Essential hypertension 10/09/2017   Low back pain radiating to right leg 10/09/2017   Mild intermittent asthma with acute exacerbation 05/30/2017   S/P arthroscopy of right knee 06/03/2014   Right knee pain 03/13/2014   Diverticulitis of colon (without mention of hemorrhage) 09/17/2013   Postmenopausal atrophic vaginitis, severe 08/18/2008   Hypothyroidism 05/09/2007   ADHD (attention deficit hyperactivity disorder) 05/09/2007    ONSET DATE: 10/08/23  REFERRING DIAG: I63.9 (ICD-10-CM) - Cerebral infarction, unspecified  THERAPY DIAG:  Attention and concentration deficit  Other symptoms and signs involving cognitive functions following cerebral infarction  Rationale for Evaluation and Treatment: Rehabilitation  SUBJECTIVE:   SUBJECTIVE STATEMENT: Pt reports they think cellulitis in her R thumb.  Pt is taking antibiotic for her thumb.    Pt's spouse reports noticing that she was leaning posteriorly yesterday in the store, pt attributes to fatigue from multiple errands.  Spouse had pt hold onto cart and then took her home.    Pt  accompanied by: self and spouse  PERTINENT HISTORY: ADHD, HTN, diverticulitis, HLD, hypothyroidism, low back pain, neck fusion, mild cognitive impairment, OA in R knee, asthma   presented to [facility] ED on 10/08/2023 with sudden onset of left facial droop, left-sided weakness,and L visual cut. MRI: Multiple punctate acute infarcts bilateral frontal and left parietal lobes. EEG suggestive of moderate diffuse encephalopathy.   PRECAUTIONS:  Fall  WEIGHT BEARING RESTRICTIONS: No  PAIN:  Are you having pain? "12 out of 10," throbbing pain, R thumb.  FALLS: Has patient fallen in last 6 months? No  LIVING ENVIRONMENT: Lives with: lives with their spouse Lives in: House/apartment Stairs: Yes: Internal: bedroom/bathroom on main floor, does have a full flight of steps - spouse's office is upstairs  steps; one rail and External: 2 steps;   Has following equipment at home: shower chair and hand held shower head  PLOF: Independent, Independent with basic ADLs, and Leisure: walking up to 4-6 hours per day  PATIENT GOALS: "I really don't know"  OBJECTIVE:  Note: Objective measures were completed at Evaluation unless otherwise noted.  HAND DOMINANCE: Right  ADLs: Overall ADLs: Mod I with bathing and dressing, pt's spouse will go in and out of bathroom while pt in shower providing distant supervision to Mod I level Transfers/ambulation related to ADLs: Mod I without AD, getting into high bed without any issue  IADLs: Shopping: pt gets overwhelmed with everything that is going on, easily distracted  Meal Prep: spouse is doing most of the cooking right now as pt forgetful with sequencing and ingredients, and easily distracted Community mobility: not cleared to drive at this time Medication management: spouse is assisting now, he will fill up a daily pill box for pt   MOBILITY STATUS: Mod I without AD  POSTURE COMMENTS:  No Significant postural limitations  ACTIVITY TOLERANCE: Activity tolerance: pt reports worn out after 30 min walk (where as they would walk up to 2 hrs at a time before stroke).  Pt is not sleeping as much during the day time.  FUNCTIONAL OUTCOME MEASURES: 9 hole peg test: Right: 36.54 sec; Left: 34.06 sec Trail Making: 42.47 sec for Trail A   UPPER EXTREMITY ROM:  WFL bilaterally  UPPER EXTREMITY MMT:     MMT Right eval Left eval  Shoulder flexion 4+ 4-  Shoulder abduction    Shoulder adduction     Shoulder extension    Shoulder internal rotation    Shoulder external rotation    Middle trapezius    Lower trapezius    Elbow flexion 4 4  Elbow extension 4- 4-  Wrist flexion    Wrist extension    Wrist ulnar deviation    Wrist radial deviation    Wrist pronation    Wrist supination    (Blank rows = not tested)  COORDINATION: 9 Hole Peg test: Right: 36.54 sec; Left: 34.06 sec Box and Blocks:  Right 43 blocks, Left 36blocks - Pt requiring cues x3 to remember to only pick up one block at a time when completing with Right; requiring cues to go over barrier instead of around (possibly slowing down pace even more).  SENSATION: WFL  COGNITION: Overall cognitive status: Impaired and History of cognitive impairments - at baseline Pt able to recall 3 words: sock blue bed after 5 mins  Pt with difficulty with word finding and memory  VISION: Subjective report: wears glasses, reported f/u with neurology and ophthalmologist who report to wait for further assessment to allow recovery  s/p CVA Baseline vision: Wears glasses all the time  VISION ASSESSMENT: To be further assessed in functional context Eye alignment: WFL Ocular ROM: WFL Gaze preference/alignment: WDL Tracking/Visual pursuits: TBA Visual Fields: TBA in functional context   Bell cancellation test: locating 32/35 in 3:00, pt then able to locate remaining 2/3 in 4:20 before stating "I think that is all"  PERCEPTION: Impaired  PRAXIS: Impaired: Initiation, Motor planning, and Organization  OBSERVATIONS: Difficult to assess vision, attention, and strength as pt requiring increased cueing and repeat directions.  Unsure full attention and effort during MMT this session.  Pt forgetting instructions during coordination assessment, requiring wait time for recall and/or additional cues.                                                                                                                             TREATMENT  DATE:  12/07/23 Self-care: engaged in discussion about downsizing purse due to continued Left lean with mobility and reports of increased fatigue with errands/activities yesterday.  OT educated on use of cross body bag vs backpack to redistribute weight and offset additional lean. Pattern replication: engaged in large grip peg board pattern replication.  OT placing picture on left to facilitate visual scanning and attention to left.  Pt demonstrating good use of RUE this session, despite bandaging on thumb.  OT increased challenge to incorporate increased alternating attention. Alternating attention: Engaged in peg board pattern replication with use of key to identify correlating colors, challenging alternating attention, visual perception and attention to task. Pt demonstrating good attention to task, with use of linear scanning and finger follow to increase recall of sequence. OT educated on functional carryover of task implications with systematic process to minimize onset of errors.   Self-care: educated on decreasing visual and auditory distractions with IADLs as well as when going out in the community.  Discussed going to familiar stores, going out earlier in the morning before the heat of the day, going to businesses/stores during off-peak times to decrease stimulus and allow for increased attention/focus on task.  Educated on reducing visual stimulus during reading with use of bookmark and reducing auditory stimulus by turning off TV or radio and/or completing tasks in quieter room.  PATIENT EDUCATION: Education details: see today's tx above Person educated: Patient and Spouse Education method: Explanation and Handouts Education comprehension: verbalized understanding and needs further education  HOME EXERCISE PROGRAM: Provided written suggestions for keeping thinking skills sharp - plan to provide printout at future session   GOALS: Goals reviewed with patient? Yes  SHORT TERM GOALS:  Target date: 12/15/23  Pt will be independent with coordination HEP with visual handouts for recall. Baseline: new to OPOT Goal status: in progress  2.  Pt will demonstrate understanding of memory compensations and ways to keep thinking skills sharp Baseline: STM deficits and impaired initiation Goal status: in progress  3.  Pt will demonstrate and/or verbalize understanding of visual attention exercises and compensatory strategies  for improved visual scanning and attention. Baseline: left field cut Goal status: in progress  4.  Pt will demonstrate improved coordination, attention to task, and recall of instructions by completing box and blocks assessment with improved score by 6 blocks bilaterally. Baseline: Right 43 blocks, Left 36blocks - Pt requiring cues x3 to remember to only pick up one block at a time when completing with Right; requiring cues to go over barrier instead of around (possibly slowing down pace even more). Goal status: in progress   LONG TERM GOALS: Target date: 01/05/24  Pt will verbalize understanding of task modifications, adaptive strategies, and/or potential AE needs to increase ease, safety, and independence w/ IADLs. Baseline: decreased engagement in IADLs due to impaired memory and attention Goal status: in progress  2.  Pt will complete simulated medication management activity w/ Supervision by discharge, incorporating compensatory strategies/AE prn Baseline: spouse managing meds at this time, was not prior to CVA Goal status: in progress  3.  Pt will demonstrate ability to sequence simple functional task (simple snack prep, laundry task, etc) at Mod I level with good safety awareness and ability to utilize memory strategies for recall. Baseline:  Goal status: in progress  4.  Pt will navigate a moderately busy environment, completing dual task activity and/or following multi-step commands with 90% accuracy Baseline:  Goal status: in progress  5.  Pt  will demonstrate improved initiation and sequencing to be able to navigate use of personal cell phone and send text messages in <15 mins. Baseline: spouse reporting 45 mins to reply to test message Goal status: in progress   ASSESSMENT:  CLINICAL IMPRESSION: Pt tolerated tasks well this session.  Pt demonstrating improved attention to L visual field both with table top task and when walking from waiting room to treatment room.  Pt demonstrating improved sequencing during L> R scanning and functional use of RUE this session.  Pt will continue to benefit from skilled occupational therapy services to address strength and coordination, GM/FM control, cognition, safety awareness, introduction of compensatory strategies/AE prn, visual-perception, and implementation of an HEP to improve participation and safety during ADLs and IADLs.    PERFORMANCE DEFICITS: in functional skills including ADLs, IADLs, coordination, strength, Fine motor control, Gross motor control, endurance, decreased knowledge of precautions, vision, and UE functional use, cognitive skills including attention, energy/drive, memory, problem solving, safety awareness, and sequencing, and psychosocial skills including environmental adaptation and routines and behaviors.     PLAN:  OT FREQUENCY: 1-2x/week  OT DURATION: 6 weeks  PLANNED INTERVENTIONS: 97168 OT Re-evaluation, 97535 self care/ADL training, 16109 therapeutic exercise, 97530 therapeutic activity, 97112 neuromuscular re-education, functional mobility training, visual/perceptual remediation/compensation, psychosocial skills training, energy conservation, coping strategies training, patient/family education, and DME and/or AE instructions  RECOMMENDED OTHER SERVICES: SLP  CONSULTED AND AGREED WITH PLAN OF CARE: Patient and family member/caregiver  PLAN FOR NEXT SESSION:  educate on memory strategies and keeping thinking skills sharp  Cognitive/motor dual tasking,  sequencing, recall of 2 step directions  Phone use  Further assess during functional tasks, review PRN visual compensatory strategies (especially during functional tasks)  Visual scanning tasks    Anthonette Kinsman, OTR/L 12/07/2023, 4:31 PM  [facility] at [facility] [address] [place], [place] [address] Phone # [phone] Fax # [phone]

## 2023-12-07 NOTE — Patient Instructions (Signed)
 SLP provided pt and husband with semantic feature analysis example and sheets to complete at home

## 2023-12-07 NOTE — Therapy (Signed)
 OUTPATIENT SPEECH LANGUAGE PATHOLOGY EVALUATION   [name] MRN: [id number] DOB:1950/10/22, 73 y.o., female Today's Date: 12/07/2023  PCP: [name], NP REFERRING PROVIDER: [name], MD  END OF SESSION:  End of Session - 12/07/23 1735     Visit Number 1    Number of Visits 17    Date for SLP Re-Evaluation 02/02/24    SLP Start Time 1619    SLP Stop Time  1705    SLP Time Calculation (min) 46 min    Activity Tolerance Patient tolerated treatment well             Past Medical History:  Diagnosis Date   ADHD (attention deficit hyperactivity disorder) 05/09/2007   Diverticulitis of colon (without mention of hemorrhage) 09/17/2013   Essential hypertension 10/09/2017   History of COVID-19 2021   Hyperlipidemia    Hypothyroidism    Low back pain radiating to right leg 10/09/2017   Mild cognitive impairment of uncertain or unknown etiology 08/04/2022   Mild intermittent asthma with acute exacerbation 05/30/2017   Osteoarthritis    Postmenopausal atrophic vaginitis, severe 08/18/2008   Right knee pain 03/13/2014   S/P arthroscopy of right knee 06/03/2014   Vitamin B12 deficiency    Past Surgical History:  Procedure Laterality Date   ABDOMINAL HYSTERECTOMY     BREAST SURGERY     CARPAL TUNNEL RELEASE Bilateral    CERVICAL DISC ARTHROPLASTY     CESAREAN SECTION     x2   left shoulder surgery     LOOP RECORDER INSERTION N/A 10/12/2023   Procedure: LOOP RECORDER INSERTION;  Surgeon: Boyce Byes, MD;  Location: [facility];  Service: Cardiovascular;  Laterality: N/A;   MENISCUS REPAIR Right 2017   REDUCTION MAMMAPLASTY Bilateral    right shoulder     TONSILLECTOMY     Patient Active Problem List   Diagnosis Date Noted   NSVT (nonsustained ventricular tachycardia) (HCC) 10/12/2023   CVA (cerebral vascular accident) (HCC) 10/12/2023   Acute combined systolic and diastolic heart failure (HCC) 10/11/2023   HFrEF  (heart failure with reduced ejection fraction) (HCC) 10/10/2023   Acute ischemic stroke (HCC) 10/08/2023   MCI (mild cognitive impairment) 08/04/2022   Vitamin B12 deficiency    Osteoarthritis 08/03/2022   Hyperlipidemia 08/03/2022   History of COVID-19 2021   Essential hypertension 10/09/2017   Low back pain radiating to right leg 10/09/2017   Mild intermittent asthma with acute exacerbation 05/30/2017   S/P arthroscopy of right knee 06/03/2014   Right knee pain 03/13/2014   Diverticulitis of colon (without mention of hemorrhage) 09/17/2013   Postmenopausal atrophic vaginitis, severe 08/18/2008   Hypothyroidism 05/09/2007   ADHD (attention deficit hyperactivity disorder) 05/09/2007    ONSET DATE:  10/08/23  REFERRING DIAG: X91.478 (ICD-10-CM) - Aphasia as late effect of stroke  THERAPY DIAG:  Aphasia  Cognitive communication deficit  Rationale for Evaluation and Treatment: Rehabilitation  SUBJECTIVE:   SUBJECTIVE STATEMENT: "Sometimes describing it helps me remember."  Pt accompanied by: family member Husband [name]  PERTINENT HISTORY: HPI: Pt presented to [facility] ED on 10/08/2023 with sudden onset of left facial droop and left-sided weakness. ADHD, HTN, diverticulitis, HLD, hypothyroidism, low back pain, neck fusion, mild cognitive impairment (dx Jan 2024), cervical disc arthroplasty, OA in R knee, asthma   PAIN:  Are you having pain? No  FALLS: Has patient fallen in last 6 months?  No  LIVING ENVIRONMENT: Lives with: lives with their spouse Lives  in: House/apartment PLOF:  Level of assistance: Independent with ADLs, Independent with IADLs Employment: Retired - Armed forces operational officer  PATIENT GOALS: Improve language function, memory  OBJECTIVE:  Note: Objective measures were completed at Evaluation unless otherwise noted.  DIAGNOSTIC FINDINGS:  MRI 10/09/23 IMPRESSION: 1. Findings compatible with multiple punctate acute infarcts in bilateral frontal and left parietal  lobes. Given involvement of multiple vascular territories, consider an embolic etiology. 2. Significantly motion limited study.   Date of Discharge from SLP service:October 19, 2023 Clinical Impression/Discharge Summary: Pt has made great gains and has met 4 of 6 LTG's this admission due to improved cognition and language. Pt is currently an overall mod I A for orientation tasks, though requires up to [facility] for attention. Patient requires mod I A for expressive language, though continues to require occasional min-modA for complex receptive language tasks.  Pt/family education complete and pt will discharge home with 24 hour supervision from friends/family/etc. Pt would benefit from OP f/u ST services to maximize cognition and communication in order to maximize functional independence.    COGNITION: Overall cognitive status: Impaired, and History of cognitive impairments - at baseline Areas of impairment:  Attention: Impaired: Comment: to be assessed in first 2 sessions Memory: Impaired: Short term - to be assessed more fully in first 2 sessions Functional deficits: Pt is not cooking or driving, and is not responsible for medication nor appointment management at this time.  AUDITORY COMPREHENSION: Overall auditory comprehension: Appears intact YES/NO questions: Appears intact Following directions: Appears intact Interfering components: attention  READING COMPREHENSION: Intact  EXPRESSION: verbal  VERBAL EXPRESSION: Level of generative/spontaneous verbalization: conversation Automatic speech: name: intact, counting: intact, day of week: intact, and month of year: intact  Repetition: Appears intact Naming: Confrontation: 51-75% (55%) Pragmatics: Appears intact Comments: Pt with anomia for "spine" when telling SLP about her cervical disc arthroplasty. Interfering components: attention and premorbid deficit (MCI) Effective technique: phonemic cues Non-verbal means of communication: gestures -  pt used these during BNT and assisted generating target word  WRITTEN EXPRESSION: Dominant hand: right Written expression: Not tested  MOTOR SPEECH: Overall motor speech: Appears intact Respiration: thoracic breathing and diaphragmatic/abdominal breathing Phonation: normal Resonance: WFL Articulation: Appears intact Intelligibility: Intelligible  ORAL MOTOR EXAMINATION: Overall status: WFL  STANDARDIZED ASSESSMENTS: [place] NAMING TEST: pt scored 33/60 on this assessment, but was assisted with more common words by phonemic cues.  PATIENT REPORTED OUTCOME MEASURES (PROM): Communication Effectiveness Survey: to be provided in first 2 sessions                                                                                                                             TREATMENT DATE:  Semantic Feature Analysis (SFA)  12/07/23: SLP educated pt and husband [name]) with describing phonemic cues, as this appeared to help pt during BNT. SLP provided examples during BNT and told [name] to assist pt in this way if he is sure of the word pt means to say.  Secondly, SLP educated pt and husband on how to complete semantic feature analysis (SFA) at home with words/nouns pt experiences anomia with. SLP explained benefits of SFA to pt and husband. [name] took notes during session. SLP provided handouts for this framework.  PATIENT EDUCATION: Education details: see "treatment date" Person educated: Patient and Spouse Education method: Explanation, Demonstration, Verbal cues, and Handouts Education comprehension: verbalized understanding and needs further education   GOALS: Goals reviewed with patient? Yes in general  SHORT TERM GOALS: Target date: 01/05/24  Pt will complete cognitive linguistic assessment in first 2 sessions  Baseline: Goal status: INITIAL  2.  Pt will successfully use anomia compensations with min A to do so, for 10 minutes functional simple conversation in 2  sessions Baseline:  Goal status: INITIAL  3.  Pt will demo how to use SFA with rare min A in 3 sessions Baseline:  Goal status: INITIAL   LONG TERM GOALS: Target date: 02/02/24  Pt will improve PROM compared to initial administration Baseline:  Goal status: INITIAL  2.  Pt will participate in 10 minutes mod complex conversation with functional expressive language (using anomia compensations) in 3 sessions Baseline:  Goal status: INITIAL   ASSESSMENT:  CLINICAL IMPRESSION: Patient is a 73 y.o. F who was seen today for assessment of aphasia in light of bilateral frontal CVA, and parietal CVA. Pt demonstrated anomia during conversation today, and scordd 33/60 on the BNT-2 (more than 3 standard deviations below the mean). She also demonstrated s/sx of cognitive linguistic impairment requiring a full evaluation in the first 2 sessions. Pt with premorbid dx of ADHD and MCI.   OBJECTIVE IMPAIRMENTS: include attention, memory, awareness, and aphasia. These impairments are limiting patient from managing medications, managing appointments, managing finances, household responsibilities, ADLs/IADLs, and effectively communicating at home and in community. Factors affecting potential to achieve goals and functional outcome are ability to learn/carryover information and previous level of function. Patient will benefit from skilled SLP services to address above impairments and improve overall function.  REHAB POTENTIAL: Good  PLAN:  SLP FREQUENCY: 2x/week  SLP DURATION: 8 weeks  PLANNED INTERVENTIONS: Language facilitation, Environmental controls, Cueing hierachy, Cognitive reorganization, Internal/external aids, Functional tasks, Multimodal communication approach, SLP instruction and feedback, Compensatory strategies, Patient/family education, [id number] Treatment of speech (30 or 45 min) , and 47829 (Standardized cognitive assessment)    Kathee Tumlin, CCC-SLP 12/07/2023, 5:36 PM

## 2023-12-13 ENCOUNTER — Ambulatory Visit: Admitting: Occupational Therapy

## 2023-12-13 ENCOUNTER — Other Ambulatory Visit: Payer: Self-pay | Admitting: Adult Health

## 2023-12-13 DIAGNOSIS — R41840 Attention and concentration deficit: Secondary | ICD-10-CM | POA: Diagnosis not present

## 2023-12-13 DIAGNOSIS — I69318 Other symptoms and signs involving cognitive functions following cerebral infarction: Secondary | ICD-10-CM

## 2023-12-13 NOTE — Therapy (Signed)
 OUTPATIENT OCCUPATIONAL THERAPY NEURO Treatment  [name] MRN: [id number] DOB:1950-09-26, 73 y.o., female Today's Date: 12/13/2023  PCP: [name], NP REFERRING PROVIDER: [name], MD  END OF SESSION:  OT End of Session - 12/13/23 1402     Visit Number 4    Number of Visits 11    Date for OT Re-Evaluation 01/05/24    Authorization Type [employer]    OT Start Time 1317    OT Stop Time 1358    OT Time Calculation (min) 41 min    Activity Tolerance Patient tolerated treatment well   RUE thumb pain and swelling   Behavior During Therapy WFL for tasks assessed/performed                Past Medical History:  Diagnosis Date   ADHD (attention deficit hyperactivity disorder) 05/09/2007   Diverticulitis of colon (without mention of hemorrhage) 09/17/2013   Essential hypertension 10/09/2017   History of COVID-19 2021   Hyperlipidemia    Hypothyroidism    Low back pain radiating to right leg 10/09/2017   Mild cognitive impairment of uncertain or unknown etiology 08/04/2022   Mild intermittent asthma with acute exacerbation 05/30/2017   Osteoarthritis    Postmenopausal atrophic vaginitis, severe 08/18/2008   Right knee pain 03/13/2014   S/P arthroscopy of right knee 06/03/2014   Vitamin B12 deficiency    Past Surgical History:  Procedure Laterality Date   ABDOMINAL HYSTERECTOMY     BREAST SURGERY     CARPAL TUNNEL RELEASE Bilateral    CERVICAL DISC ARTHROPLASTY     CESAREAN SECTION     x2   left shoulder surgery     LOOP RECORDER INSERTION N/A 10/12/2023   Procedure: LOOP RECORDER INSERTION;  Surgeon: Boyce Byes, MD;  Location: [facility];  Service: Cardiovascular;  Laterality: N/A;   MENISCUS REPAIR Right 2017   REDUCTION MAMMAPLASTY Bilateral    right shoulder     TONSILLECTOMY     Patient Active Problem List   Diagnosis Date Noted   NSVT (nonsustained ventricular tachycardia) (HCC) 10/12/2023    CVA (cerebral vascular accident) (HCC) 10/12/2023   Acute combined systolic and diastolic heart failure (HCC) 10/11/2023   HFrEF (heart failure with reduced ejection fraction) (HCC) 10/10/2023   Acute ischemic stroke (HCC) 10/08/2023   MCI (mild cognitive impairment) 08/04/2022   Vitamin B12 deficiency    Osteoarthritis 08/03/2022   Hyperlipidemia 08/03/2022   History of COVID-19 2021   Essential hypertension 10/09/2017   Low back pain radiating to right leg 10/09/2017   Mild intermittent asthma with acute exacerbation 05/30/2017   S/P arthroscopy of right knee 06/03/2014   Right knee pain 03/13/2014   Diverticulitis of colon (without mention of hemorrhage) 09/17/2013   Postmenopausal atrophic vaginitis, severe 08/18/2008   Hypothyroidism 05/09/2007   ADHD (attention deficit hyperactivity disorder) 05/09/2007    ONSET DATE: 10/08/23  REFERRING DIAG: I63.9 (ICD-10-CM) - Cerebral infarction, unspecified  THERAPY DIAG:  Attention and concentration deficit  Other symptoms and signs involving cognitive functions following cerebral infarction  Rationale for Evaluation and Treatment: Rehabilitation  SUBJECTIVE:   SUBJECTIVE STATEMENT: Pt reported R thumb is improving and reported no more pain. Pt's spouse reported thumb nail fell off so pt currently wearing bandage over R thumb.   Pt accompanied by: self and spouse  PERTINENT HISTORY: ADHD, HTN, diverticulitis, HLD, hypothyroidism, low back pain, neck fusion, mild cognitive impairment, OA in R knee, asthma   presented  to [facility] ED on 10/08/2023 with sudden onset of left facial droop, left-sided weakness,and L visual cut. MRI: Multiple punctate acute infarcts bilateral frontal and left parietal lobes. EEG suggestive of moderate diffuse encephalopathy.   PRECAUTIONS: Fall  WEIGHT BEARING RESTRICTIONS: No  PAIN:  Are you having pain? No pain  FALLS: Has patient fallen in last 6 months? No  LIVING ENVIRONMENT: Lives with: lives  with their spouse Lives in: House/apartment Stairs: Yes: Internal: bedroom/bathroom on main floor, does have a full flight of steps - spouse's office is upstairs  steps; one rail and External: 2 steps;   Has following equipment at home: shower chair and hand held shower head  PLOF: Independent, Independent with basic ADLs, and Leisure: walking up to 4-6 hours per day  PATIENT GOALS: "I really don't know"  OBJECTIVE:  Note: Objective measures were completed at Evaluation unless otherwise noted.  HAND DOMINANCE: Right  ADLs: Overall ADLs: Mod I with bathing and dressing, pt's spouse will go in and out of bathroom while pt in shower providing distant supervision to Mod I level Transfers/ambulation related to ADLs: Mod I without AD, getting into high bed without any issue  IADLs: Shopping: pt gets overwhelmed with everything that is going on, easily distracted  Meal Prep: spouse is doing most of the cooking right now as pt forgetful with sequencing and ingredients, and easily distracted Community mobility: not cleared to drive at this time Medication management: spouse is assisting now, he will fill up a daily pill box for pt   MOBILITY STATUS: Mod I without AD  POSTURE COMMENTS:  No Significant postural limitations  ACTIVITY TOLERANCE: Activity tolerance: pt reports worn out after 30 min walk (where as they would walk up to 2 hrs at a time before stroke).  Pt is not sleeping as much during the day time.  FUNCTIONAL OUTCOME MEASURES: 9 hole peg test: Right: 36.54 sec; Left: 34.06 sec Trail Making: 42.47 sec for Trail A   UPPER EXTREMITY ROM:  WFL bilaterally  UPPER EXTREMITY MMT:     MMT Right eval Left eval  Shoulder flexion 4+ 4-  Shoulder abduction    Shoulder adduction    Shoulder extension    Shoulder internal rotation    Shoulder external rotation    Middle trapezius    Lower trapezius    Elbow flexion 4 4  Elbow extension 4- 4-  Wrist flexion    Wrist  extension    Wrist ulnar deviation    Wrist radial deviation    Wrist pronation    Wrist supination    (Blank rows = not tested)  COORDINATION: 9 Hole Peg test: Right: 36.54 sec; Left: 34.06 sec Box and Blocks:  Right 43 blocks, Left 36blocks - Pt requiring cues x3 to remember to only pick up one block at a time when completing with Right; requiring cues to go over barrier instead of around (possibly slowing down pace even more).  SENSATION: WFL  COGNITION: Overall cognitive status: Impaired and History of cognitive impairments - at baseline Pt able to recall 3 words: sock blue bed after 5 mins  Pt with difficulty with word finding and memory  VISION: Subjective report: wears glasses, reported f/u with neurology and ophthalmologist who report to wait for further assessment to allow recovery s/p CVA Baseline vision: Wears glasses all the time  VISION ASSESSMENT: To be further assessed in functional context Eye alignment: WFL Ocular ROM: WFL Gaze preference/alignment: WDL Tracking/Visual pursuits: TBA Visual Fields: TBA in  functional context   Bell cancellation test: locating 32/35 in 3:00, pt then able to locate remaining 2/3 in 4:20 before stating "I think that is all"  PERCEPTION: Impaired  PRAXIS: Impaired: Initiation, Motor planning, and Organization  OBSERVATIONS: Difficult to assess vision, attention, and strength as pt requiring increased cueing and repeat directions.  Unsure full attention and effort during MMT this session.  Pt forgetting instructions during coordination assessment, requiring wait time for recall and/or additional cues.                                                                                                                             TREATMENT DATE:  12/13/23  Self-Care OT educated pt and spouse on memory compensation strategies and activities to keep thinking skills sharp. Handout provided, see pt instructions. Pt and spouse acknowledged  understanding.  OT reviewed Strategies to reduce fall risk and safety with ADLs/IADLs. Pt showed that she now has smaller over-the-shoulder purse. Pt acknowledged understanding of strategies.   TherAct Copying small peg designs, graded up from 3-color 5x5 design to 4-color 10x10 design - to improve FM coordination and dexterity of RUE, to improve sequencing and pattern recognition, for visual scanning. Pt completed task ind with extra time and ind recognized then self-corrected x1 error.  Logic puzzle 3-step - to improve sequencing, deductive reasoning, memory and recall of unfamiliar activities. Pt completed task with min to mod v/c for deductive reasoning components of task.   PATIENT EDUCATION: Education details: see today's tx above Person educated: Patient and Spouse Education method: Explanation and Handouts Education comprehension: verbalized understanding and needs further education  HOME EXERCISE PROGRAM: Provided written suggestions for keeping thinking skills sharp - plan to provide printout at future session 12/13/23 - memory compensation strategies and activities to keep thinking skills sharp (see pt instructions)   GOALS: Goals reviewed with patient? Yes  SHORT TERM GOALS: Target date: 12/15/23  Pt will be independent with coordination HEP with visual handouts for recall. Baseline: new to OPOT Goal status: in progress  2.  Pt will demonstrate understanding of memory compensations and ways to keep thinking skills sharp Baseline: STM deficits and impaired initiation Goal status: in progress  3.  Pt will demonstrate and/or verbalize understanding of visual attention exercises and compensatory strategies for improved visual scanning and attention. Baseline: left field cut Goal status: in progress  4.  Pt will demonstrate improved coordination, attention to task, and recall of instructions by completing box and blocks assessment with improved score by 6 blocks  bilaterally. Baseline: Right 43 blocks, Left 36blocks - Pt requiring cues x3 to remember to only pick up one block at a time when completing with Right; requiring cues to go over barrier instead of around (possibly slowing down pace even more). Goal status: in progress   LONG TERM GOALS: Target date: 01/05/24  Pt will verbalize understanding of task modifications, adaptive strategies, and/or potential AE needs to increase ease, safety, and independence  w/ IADLs. Baseline: decreased engagement in IADLs due to impaired memory and attention Goal status: in progress  2.  Pt will complete simulated medication management activity w/ Supervision by discharge, incorporating compensatory strategies/AE prn Baseline: spouse managing meds at this time, was not prior to CVA Goal status: in progress  3.  Pt will demonstrate ability to sequence simple functional task (simple snack prep, laundry task, etc) at Mod I level with good safety awareness and ability to utilize memory strategies for recall. Baseline:  Goal status: in progress  4.  Pt will navigate a moderately busy environment, completing dual task activity and/or following multi-step commands with 90% accuracy Baseline:  Goal status: in progress  5.  Pt will demonstrate improved initiation and sequencing to be able to navigate use of personal cell phone and send text messages in <15 mins. Baseline: spouse reporting 45 mins to reply to test message Goal status: in progress   ASSESSMENT:  CLINICAL IMPRESSION: Pt tolerated tasks well this session.  Pt demonstrating improved attention to L visual field both with table top task and when walking from waiting room to treatment room. Pt tolerated tasks well and reported decreased pain and improved function of R thumb. Pt will continue to benefit from skilled occupational therapy services to address strength and coordination, GM/FM control, cognition, safety awareness, introduction of compensatory  strategies/AE prn, visual-perception, and implementation of an HEP to improve participation and safety during ADLs and IADLs.    PERFORMANCE DEFICITS: in functional skills including ADLs, IADLs, coordination, strength, Fine motor control, Gross motor control, endurance, decreased knowledge of precautions, vision, and UE functional use, cognitive skills including attention, energy/drive, memory, problem solving, safety awareness, and sequencing, and psychosocial skills including environmental adaptation and routines and behaviors.     PLAN:  OT FREQUENCY: 1-2x/week  OT DURATION: 6 weeks  PLANNED INTERVENTIONS: 97168 OT Re-evaluation, 97535 self care/ADL training, 04540 therapeutic exercise, 97530 therapeutic activity, 97112 neuromuscular re-education, functional mobility training, visual/perceptual remediation/compensation, psychosocial skills training, energy conservation, coping strategies training, patient/family education, and DME and/or AE instructions  RECOMMENDED OTHER SERVICES: SLP  CONSULTED AND AGREED WITH PLAN OF CARE: Patient and family member/caregiver  PLAN FOR NEXT SESSION:   Cognitive/motor dual tasking, sequencing, recall of 2 step directions  Phone use  Further assess during functional tasks, review PRN visual compensatory strategies (especially during functional tasks)  Visual scanning tasks  Sequencing tasks    Oakley Bellman, OTR/L 12/13/2023, 2:04 PM  [facility] [address] [place], [place] [address] Phone # [phone] Fax # [phone]

## 2023-12-13 NOTE — Patient Instructions (Addendum)
   Note: OT Removed #15 from pt's handout d/t safety concerns.

## 2023-12-14 ENCOUNTER — Telehealth: Payer: Self-pay | Admitting: Adult Health

## 2023-12-14 ENCOUNTER — Ambulatory Visit

## 2023-12-14 DIAGNOSIS — R41841 Cognitive communication deficit: Secondary | ICD-10-CM

## 2023-12-14 DIAGNOSIS — R4701 Aphasia: Secondary | ICD-10-CM

## 2023-12-14 DIAGNOSIS — R41840 Attention and concentration deficit: Secondary | ICD-10-CM | POA: Diagnosis not present

## 2023-12-14 NOTE — Telephone Encounter (Signed)
Called pt twice no answer.

## 2023-12-14 NOTE — Telephone Encounter (Signed)
 Pt has a question about one of her medication.  She is requesting that Nils Baseman gives her a call.

## 2023-12-14 NOTE — Therapy (Signed)
 OUTPATIENT SPEECH LANGUAGE PATHOLOGY TREATMENT   [name] MRN: [id number] DOB:03-Jul-1951, 73 y.o., female Today's Date: 12/14/2023  PCP: [name], NP REFERRING PROVIDER: [name], MD  END OF SESSION:  End of Session - 12/14/23 1402     Visit Number 2    Number of Visits 17    Date for SLP Re-Evaluation 02/02/24    SLP Start Time 1405    SLP Stop Time  1445    SLP Time Calculation (min) 40 min    Activity Tolerance Patient tolerated treatment well             Past Medical History:  Diagnosis Date   ADHD (attention deficit hyperactivity disorder) 05/09/2007   Diverticulitis of colon (without mention of hemorrhage) 09/17/2013   Essential hypertension 10/09/2017   History of COVID-19 2021   Hyperlipidemia    Hypothyroidism    Low back pain radiating to right leg 10/09/2017   Mild cognitive impairment of uncertain or unknown etiology 08/04/2022   Mild intermittent asthma with acute exacerbation 05/30/2017   Osteoarthritis    Postmenopausal atrophic vaginitis, severe 08/18/2008   Right knee pain 03/13/2014   S/P arthroscopy of right knee 06/03/2014   Vitamin B12 deficiency    Past Surgical History:  Procedure Laterality Date   ABDOMINAL HYSTERECTOMY     BREAST SURGERY     CARPAL TUNNEL RELEASE Bilateral    CERVICAL DISC ARTHROPLASTY     CESAREAN SECTION     x2   left shoulder surgery     LOOP RECORDER INSERTION N/A 10/12/2023   Procedure: LOOP RECORDER INSERTION;  Surgeon: Boyce Byes, MD;  Location: [facility];  Service: Cardiovascular;  Laterality: N/A;   MENISCUS REPAIR Right 2017   REDUCTION MAMMAPLASTY Bilateral    right shoulder     TONSILLECTOMY     Patient Active Problem List   Diagnosis Date Noted   NSVT (nonsustained ventricular tachycardia) (HCC) 10/12/2023   CVA (cerebral vascular accident) (HCC) 10/12/2023   Acute combined systolic and diastolic heart failure (HCC) 10/11/2023   HFrEF  (heart failure with reduced ejection fraction) (HCC) 10/10/2023   Acute ischemic stroke (HCC) 10/08/2023   MCI (mild cognitive impairment) 08/04/2022   Vitamin B12 deficiency    Osteoarthritis 08/03/2022   Hyperlipidemia 08/03/2022   History of COVID-19 2021   Essential hypertension 10/09/2017   Low back pain radiating to right leg 10/09/2017   Mild intermittent asthma with acute exacerbation 05/30/2017   S/P arthroscopy of right knee 06/03/2014   Right knee pain 03/13/2014   Diverticulitis of colon (without mention of hemorrhage) 09/17/2013   Postmenopausal atrophic vaginitis, severe 08/18/2008   Hypothyroidism 05/09/2007   ADHD (attention deficit hyperactivity disorder) 05/09/2007    ONSET DATE:  10/08/23  REFERRING DIAG: O96.295 (ICD-10-CM) - Aphasia as late effect of stroke  THERAPY DIAG:  Cognitive communication deficit  Aphasia  Rationale for Evaluation and Treatment: Rehabilitation  SUBJECTIVE:   SUBJECTIVE STATEMENT: "Are these self-explanatory?" (Re: homework)  Pt accompanied by: self   PERTINENT HISTORY: HPI: Pt presented to [facility] ED on 10/08/2023 with sudden onset of left facial droop and left-sided weakness. ADHD, HTN, diverticulitis, HLD, hypothyroidism, low back pain, neck fusion, mild cognitive impairment (dx Jan 2024), cervical disc arthroplasty, OA in R knee, asthma   PAIN:  Are you having pain? No  FALLS: Has patient fallen in last 6 months?  No  LIVING ENVIRONMENT: Lives with: lives with their spouse Lives in: House/apartment PLOF:  Level of assistance: Independent with ADLs, Independent with IADLs Employment: Retired - Armed forces operational officer  PATIENT GOALS: Improve language function, memory  OBJECTIVE:  Note: Objective measures were completed at Evaluation unless otherwise noted.  DIAGNOSTIC FINDINGS:  MRI 10/09/23 IMPRESSION: 1. Findings compatible with multiple punctate acute infarcts in bilateral frontal and left parietal lobes. Given  involvement of multiple vascular territories, consider an embolic etiology. 2. Significantly motion limited study.   Date of Discharge from SLP service:October 19, 2023 Clinical Impression/Discharge Summary: Pt has made great gains and has met 4 of 6 LTG's this admission due to improved cognition and language. Pt is currently an overall mod I A for orientation tasks, though requires up to [facility] for attention. Patient requires mod I A for expressive language, though continues to require occasional min-modA for complex receptive language tasks.  Pt/family education complete and pt will discharge home with 24 hour supervision from friends/family/etc. Pt would benefit from OP f/u ST services to maximize cognition and communication in order to maximize functional independence.     STANDARDIZED ASSESSMENTS:   Cognitive Linguistic Quick Test  AGE - 70-89  The Cognitive Linguistic Quick Test (CLQT) was administered to assess the relative status of five cognitive domains: attention, memory, language, executive functioning, and visuospatial skills. Scores from 10 tasks were used to estimate severity ratings (standardized for age groups 18-69 years and 70-89 years) for each domain, a clock drawing task, as well as an overall composite severity rating of cognition.     Task Score Criterion Cut Scores  Personal Facts 8/8 8  Symbol Cancellation 12/12 10  Confrontation Naming 10/10 10  Clock Drawing  12/13 11  Story Retelling 8/10 5  Symbol Trails 10/10 6  Generative Naming 3/9 4  Design Memory 4/6 4  Mazes  4/8 4  Design Generation 3/13 5   Cognitive Domain Composite Score Severity Rating  Attention 181/215 WNL  Memory 147/185 WNL  Executive Function 20/40 Low-WNL  Language 29/37 Low-WNL  Visuospatial Skills 75/105 WNL  Clock Drawing  12/13 WNL  Composite Severity Rating  WNL     PATIENT REPORTED OUTCOME MEASURES (PROM): Communication Effectiveness Survey: to be provided in first 2 sessions                                                                                                                              TREATMENT DATE:  Semantic Feature Analysis (SFA)  12/14/23: PT NEEDS PROM NEXT SESSION. SLP completed CLQT today. From pt's CLQT score it would indicate that although pt's Composite Score is WNL she had difficulty with processing speed (mazes, design generation), problem solving Forensic psychologist), memory Training and development officer memory), and language (generative naming). Goals added.   12/07/23: SLP educated pt and husband [name]) with describing phonemic cues, as this appeared to help pt during BNT. SLP provided examples during BNT and told [name] to assist pt in this way if he is sure of the word pt means to say.  Secondly, SLP educated pt and husband on how to complete semantic feature analysis (SFA) at home with words/nouns pt experiences anomia with. SLP explained benefits of SFA to pt and husband. [name] took notes during session. SLP provided handouts for this framework.  PATIENT EDUCATION: Education details: see "treatment date" Person educated: Patient and Spouse Education method: Explanation, Demonstration, Verbal cues, and Handouts Education comprehension: verbalized understanding and needs further education   GOALS: Goals reviewed with patient? Yes in general  SHORT TERM GOALS: Target date: 01/05/24  Pt will complete cognitive linguistic assessment in first 2 sessions  Baseline: Goal status: met  2.  Pt will successfully use anomia compensations with min A to do so, for 10 minutes functional simple conversation in 2 sessions Baseline:  Goal status: INITIAL  3.  Pt will demo how to use SFA with rare min A in 3 sessions Baseline:  Goal status: INITIAL  4.  Pt will demo [facility] problem solving skills in in simple cognitive linguistic tasks in 2 sessions Baseline:  Goal status: INITIAL   LONG TERM GOALS: Target date: 02/02/24  Pt will improve PROM compared to initial  administration Baseline:  Goal status: INITIAL  2.  Pt will participate in 10 minutes mod complex conversation with functional expressive language (using anomia compensations) in 3 sessions Baseline:  Goal status: INITIAL  3.  Pt will demo functional problem solving skills in practical cognitive linguistic tasks in 3 sessions Baseline:  Goal status: INITIAL   ASSESSMENT:  CLINICAL IMPRESSION: Patient is a 73 y.o. F who was seen today for assessment of aphasia in light of bilateral frontal CVA, and parietal CVA. Pt completed CLQT today with results above. Given her premorbid dx of ADHD and MCI and OT focus on other areas identified on CLQT, SLP will focus on problem solving in functional cognitive linguistic tasks. She also demonstrated s/sx of cognitive linguistic impairment requiring a full evaluation in the first 2 sessions. Pt with premorbid dx of ADHD and MCI.   OBJECTIVE IMPAIRMENTS: include attention, memory, awareness, and aphasia. These impairments are limiting patient from managing medications, managing appointments, managing finances, household responsibilities, ADLs/IADLs, and effectively communicating at home and in community. Factors affecting potential to achieve goals and functional outcome are ability to learn/carryover information and previous level of function. Patient will benefit from skilled SLP services to address above impairments and improve overall function.  REHAB POTENTIAL: Good  PLAN:  SLP FREQUENCY: 2x/week  SLP DURATION: 8 weeks  PLANNED INTERVENTIONS: Language facilitation, Environmental controls, Cueing hierachy, Cognitive reorganization, Internal/external aids, Functional tasks, Multimodal communication approach, SLP instruction and feedback, Compensatory strategies, Patient/family education, [phone] Treatment of speech (30 or 45 min) , and [phone] (Standardized cognitive assessment)    Jewels Langone, CCC-SLP 12/14/2023, 2:02 PM

## 2023-12-14 NOTE — Telephone Encounter (Signed)
 Spoke to pt spouse and she stated that pt medication for Adderall needs a new Rx.

## 2023-12-15 ENCOUNTER — Other Ambulatory Visit: Payer: Self-pay | Admitting: Adult Health

## 2023-12-15 MED ORDER — AMPHETAMINE-DEXTROAMPHETAMINE 10 MG PO TABS: 10.0000 mg | ORAL_TABLET | Freq: Every day | ORAL | 0 refills | Status: DC

## 2023-12-15 NOTE — Telephone Encounter (Signed)
 Left detailed message informing  of update.

## 2023-12-15 NOTE — Telephone Encounter (Signed)
 Noted

## 2023-12-17 ENCOUNTER — Ambulatory Visit: Payer: Self-pay

## 2023-12-17 ENCOUNTER — Institutional Professional Consult (permissible substitution): Payer: PPO | Admitting: Psychology

## 2023-12-19 ENCOUNTER — Encounter: Payer: Self-pay | Admitting: Psychology

## 2023-12-19 ENCOUNTER — Ambulatory Visit: Payer: PPO | Admitting: Psychology

## 2023-12-19 ENCOUNTER — Ambulatory Visit: Payer: Self-pay | Admitting: Psychology

## 2023-12-19 DIAGNOSIS — I6340 Cerebral infarction due to embolism of unspecified cerebral artery: Secondary | ICD-10-CM | POA: Diagnosis not present

## 2023-12-19 DIAGNOSIS — F0670 Mild neurocognitive disorder due to known physiological condition without behavioral disturbance: Secondary | ICD-10-CM | POA: Insufficient documentation

## 2023-12-19 DIAGNOSIS — R4189 Other symptoms and signs involving cognitive functions and awareness: Secondary | ICD-10-CM

## 2023-12-19 HISTORY — DX: Mild neurocognitive disorder due to known physiological condition without behavioral disturbance: F06.70

## 2023-12-19 LAB — CUP PACEART REMOTE DEVICE CHECK
Date Time Interrogation Session: 20250602131545
Implantable Pulse Generator Implant Date: 20250327

## 2023-12-19 NOTE — Progress Notes (Unsigned)
 NEUROPSYCHOLOGICAL EVALUATION [place] [place] [facility] Department of Neurology  Date of Evaluation: December 19, 2023  Reason for Referral:   RAINIE CRENSHAW is a 73 y.o. right-handed Caucasian female referred by Tex Filbert, PA-C, to characterize her current cognitive functioning and assist with diagnostic clarity and treatment planning in the context of ADHD, a prior mild neurocognitive disorder diagnosis, recent stroke, and concern for progressive cognitive decline.   Assessment and Plan:   Clinical Impression(s): Ms. Apel's pattern of performance is suggestive of severe impairment surrounding expressive language. Additional impairment was exhibited across processing speed and executive functioning. While she was unable to spontaneously recall a previously learned list of words after a delay, she benefited from cueing and was able to demonstrate appropriate recollection across two other memory tasks. Performances were also appropriate relative to age-matched peers across attention/concentration, visuospatial abilities, and encoding (i.e.., learning) aspects of memory. Functionally, Ms. Molla was fully independent prior to her March 2025 stroke. Since that time, she has benefited from assistance from her husband with instrumental ADLs and has not resumed driving. However, given the relative recency of this stroke, it remains to be seen how much of current functional limitations may improve over time. 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  Relative to her previous evaluation in January 2024, noteworthy decline was exhibited across executive functioning (namely cognitive flexibility and response inhibition), expressive language (namely phonemic fluency and confrontation naming), and delayed retrieval across verbal memory tasks. More mild decline could be argued  across processing speed, semantic fluency, and encoding (i.e., learning) aspects of verbal memory. Performances were stable across attention/concentration and visuospatial abilities.   The etiology for ongoing dysfunction is multifactorial in nature. 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 Despite parietal lobe involvement, visuospatial abilities remained stable and appropriate. However, performance declines surrounding processing speed and especially executive functioning, make conceptual sense given this event and its location(s). Her stroke experience could certainly exacerbate her baseline history of ADHD, creating a mixed presentation (i.e., cognitive dysfunction caused by more than one source).   Following her 2024 evaluation, there was some concern expressed for the very nascent beginnings of a primary progressive aphasia presentation. This was based upon word finding being her most salient cognitive concern and testing exhibiting quite significant expressive language dysfunction. However, testing patterns surrounding language in general were variable and she did exhibit several areas of strength back in 2024. Current decline surrounding expressive language is concerning, especially as there did not appear to be direct temporal lobe involvement with her recent embolic stroke. However, given the recency of her stroke and the likelihood of cognitive improvement over the next 6-9 months, firm conclusions surrounding the risk of an underlying and progressive illness in addition to stroke/ADHD concerns cannot be made. Continued medical monitoring will be important moving forward.  Recommendations: A repeat neuropsychological evaluation in 12 months (or sooner if functional decline is noted) is recommended to assess the trajectory of future cognitive functioning. This will also aid in future efforts  towards improved diagnostic clarity.  I strongly recommend ongoing outpatient speech therapy services for as long as Ms. Bitton and her husband feels that they are beneficial in assisting with word finding and outward communication.   Performance across neurocognitive testing is not a strong predictor of an individual's safety operating a  motor vehicle. Should her family wish to pursue a formalized driving evaluation, they could reach out to the following agencies: [employer] in [place]: [phone] [facility]: [phone] [facility]: [phone] [facility]: [phone] or [phone]  Should there be progression of current deficits over time, Ms. Kham is unlikely to regain any independent living skills lost. Therefore, it is recommended that she remain as involved as possible in all aspects of household chores, finances, and medication management, with supervision to ensure adequate performance. she will likely benefit from the establishment and maintenance of a routine in order to maximize her functional abilities over time.  If not already done, Ms. Moseley and her family may want to discuss her wishes regarding durable power of attorney and medical decision making, so that she can have input into these choices. If they require legal assistance with this, long-term care resource access, or other aspects of estate planning, they could reach out to [employer] at [phone] for a free consultation.   Ms. Alejo is encouraged to attend to lifestyle factors for brain health (e.g., regular physical exercise, good nutrition habits and consideration of the MIND-DASH diet, regular participation in cognitively-stimulating activities, and general stress management techniques), which are likely to have benefits for both emotional adjustment and cognition. Optimal control of vascular risk factors (including safe cardiovascular  exercise and adherence to dietary recommendations) is encouraged. Continued participation in activities which provide mental stimulation and social interaction is also recommended.   When learning new information, she would benefit from information being broken up into small, manageable pieces. she may also find it helpful to articulate the material in her own words and in a context to promote encoding at the onset of a new task. This material may need to be repeated multiple times to promote encoding.  Because she shows better recall for structured information, she will likely understand and retain new information better if it is presented to her in a meaningful or well-organized manner at the outset, such as grouping items into meaningful categories or presenting information in an outlined, bulleted, or story format.  To address problems with processing speed, she may wish to consider:   -Ensuring that she is alerted when essential material or instructions are being presented   -Adjusting the speed at which new information is presented   -Allowing for more time in comprehending, processing, and responding in conversation   -Repeating and paraphrasing instructions or conversations aloud  To address problems with fluctuating attention and/or executive dysfunction, she may wish to consider:   -Avoiding external distractions when needing to concentrate   -Limiting exposure to fast paced environments with multiple sensory demands   -Writing down complicated information and using checklists   -Attempting and completing one task at a time (i.e., no multi-tasking)   -Verbalizing aloud each step of a task to maintain focus   -Taking frequent breaks during the completion of steps/tasks to avoid fatigue   -Reducing the amount of information considered at one time   -Scheduling more difficult activities for a time of day where she is usually most alert  Given deficits in expressive language, it may be  difficult for her to provide lengthy responses to inquiries. However, she was able to produce short phrases accurately. Therefore, providing her with written multiple choice options when asking questions or asking closed questions which require only brief responses may assist with her ability to communicate effectively.  It will be important to have her paraphrase back  information rather than simply repeat to allow those working with her to ensure she understands what is being asked of her and/or told to her.  Review of Records:   Ms. Hardeman completed a comprehensive neuropsychological evaluation with myself on 08/04/2022. Results suggested impairment across several aspects of expressive language, namely verbal fluency (semantic worse than phonemic) and confrontation naming. Performance variability was exhibited across executive functioning, while an additional weakness was noted across inattention and vigilance aspects of a continuous performance task. Performances were appropriate relative to age-matched peers across processing speed, basic attention, working memory, receptive language, sentence repetition, expressive writing, visuospatial abilities, and all aspects of learning and memory. No functional concerns were noted and she was diagnosed with a mild neurocognitive disorder. The cause for ongoing dysfunction was uncertain, with her history of ADHD likely having some contribution. Repeat testing in 18-24 months was recommended.  Neuroimaging: Brain MRI on 06/14/2022 revealed mild generalized volume loss and less than typical microvascular ischemic changes. A 1-hour EEG on 06/22/2022 was unremarkable. Head CT on 05/28/2022 in the context of altered mental status was negative. Brain MRI on 06/14/2022 was stable. Head CT on 10/08/2023 was negative. Brain MRI on 10/09/2023 suggested multiple punctate acute infarcts in the bilateral frontal and left parietal lobes, suggestive of an embolic  etiology.  Past Medical History:  Diagnosis Date   Acute combined systolic and diastolic heart failure 10/11/2023   ADHD (attention deficit hyperactivity disorder) 05/09/2007   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; multiple punctate foci of restricted diffusion in bilateral frontal lobes and the left parietal lobe   Diverticulitis of colon (without mention of hemorrhage) 09/17/2013   Essential hypertension 10/09/2017   HFrEF (heart failure with reduced ejection fraction) 10/10/2023   History of COVID-19 2021   Hyperlipidemia    Hypothyroidism    Low back pain radiating to right leg 10/09/2017   Mild cognitive impairment of uncertain or unknown etiology 08/04/2022   Mild intermittent asthma with acute exacerbation 05/30/2017   NSVT (nonsustained ventricular tachycardia) 10/12/2023   Osteoarthritis    Postmenopausal atrophic vaginitis, severe 08/18/2008   Right knee pain 03/13/2014   S/P arthroscopy of right knee 06/03/2014   Vitamin B12 deficiency     Past Surgical History:  Procedure Laterality Date   ABDOMINAL HYSTERECTOMY     BREAST SURGERY     CARPAL TUNNEL RELEASE Bilateral    CERVICAL DISC ARTHROPLASTY     CESAREAN SECTION     x2   left shoulder surgery     LOOP RECORDER INSERTION N/A 10/12/2023   Procedure: LOOP RECORDER INSERTION;  Surgeon: Boyce Byes, MD;  Location: [facility] INVASIVE CV [facility];  Service: Cardiovascular;  Laterality: N/A;   MENISCUS REPAIR Right 2017   REDUCTION MAMMAPLASTY Bilateral    right shoulder     TONSILLECTOMY      Current Outpatient Medications:    Acetylcysteine (NAC) 500 MG CAPS, Take 500 mg by mouth daily., Disp: , Rfl:    amphetamine -dextroamphetamine  (ADDERALL) 10 MG tablet, Take 1 tablet (10 mg total) by mouth daily with breakfast. This dose is decreased as before admission., Disp: 30 tablet, Rfl: 0   amphetamine -dextroamphetamine  (ADDERALL) 10 MG tablet, Take 1 tablet (10 mg total) by mouth daily with breakfast.  This dose is decreased as before admission., Disp: 30 tablet, Rfl: 0   amphetamine -dextroamphetamine  (ADDERALL) 10 MG tablet, Take 1 tablet (10 mg total) by mouth daily with breakfast. This dose is decreased as before admission., Disp: 30 tablet,  Rfl: 0   Ascorbic Acid (VITAMIN C) 1000 MG tablet, Take 1,000 mg by mouth daily., Disp: , Rfl:    aspirin  81 MG chewable tablet, Chew 1 tablet (81 mg total) by mouth daily., Disp: 100 tablet, Rfl: 0   atorvastatin  (LIPITOR) 40 MG tablet, Take 1 tablet (40 mg total) by mouth daily., Disp: 90 tablet, Rfl: 3   Bioflavonoid Products (QUERCETIN COMPLEX IMMUNE PO), Take 500 mg by mouth daily., Disp: , Rfl:    Cholecalciferol (D3-50 PO), Take 1 capsule by mouth daily., Disp: , Rfl:    Evening Primrose Oil 1000 MG CAPS, Take 1,000 mg by mouth in the morning and at bedtime., Disp: , Rfl:    ezetimibe  (ZETIA ) 10 MG tablet, Take 1 tablet (10 mg total) by mouth at bedtime., Disp: 90 tablet, Rfl: 3   hydrochlorothiazide  (HYDRODIURIL ) 25 MG tablet, TAKE 1 TABLET BY MOUTH DAILY, Disp: 90 tablet, Rfl: 1   losartan  (COZAAR ) 25 MG tablet, Take 0.5 tablets (12.5 mg total) by mouth daily., Disp: 45 tablet, Rfl: 3   metoprolol  succinate (TOPROL -XL) 25 MG 24 hr tablet, Take 1 tablet (25 mg total) by mouth daily., Disp: 90 tablet, Rfl: 3   SYNTHROID  75 MCG tablet, Take 1 tablet (75 mcg total) by mouth daily., Disp: 90 tablet, Rfl: 3   vitamin B-12 (CYANOCOBALAMIN ) 1000 MCG tablet, Take 1,000 mcg by mouth 2 (two) times a week., Disp: , Rfl:    Zinc 20 MG CAPS, Take 20 mg by mouth daily., Disp: , Rfl:      08/23/2023   12:00 PM 06/02/2022   11:04 AM  MMSE - Mini Mental State Exam  Orientation to time 5 5  Orientation to Place 5 5  Registration 3 3  Attention/ Calculation 5 5  Recall 3 3  Language- name 2 objects 2 2  Language- repeat 1 1  Language- follow 3 step command 3 3  Language- read & follow direction 1 1  Write a sentence 1 1  Copy design 1 1  Total score 30 30       06/20/2022   12:00 PM  Montreal Cognitive Assessment   Visuospatial/ Executive (0/5) 5  Naming (0/3) 3  Attention: Read list of digits (0/2) 2  Attention: Read list of letters (0/1) 1  Attention: Serial 7 subtraction starting at 100 (0/3) 1  Language: Repeat phrase (0/2) 2  Language : Fluency (0/1) 0  Abstraction (0/2) 0  Delayed Recall (0/5) 3  Orientation (0/6) 6  Total 23  Adjusted Score (based on education) 23   Clinical Interview:   The following information was obtained during a clinical interview with Ms. Schoenberger and her husband prior to cognitive testing.  Cognitive Symptoms: Decreased short-term memory: Endorsed. However, when previously asked, she primarily described word finding difficulties, stating that information will generally come with time. She did not describe any prominent more traditional memory concerns. Her husband suggested that she has some trouble recalling details of recent conversations and is somewhat repetitive in conversation from time to time. These concerns were stable. Decreased long-term memory: Denied. Decreased attention/concentration: Endorsed. She was diagnosed with ADHD as an adult but described prominent trouble with inattention, distractibility, disorganization, and multi-tasking throughout the majority of her life. Symptoms were said to have persisted over time.  Reduced processing speed: Denied. She previously described her mind as racing and overly active rather than seeming slowed or feeling foggy. Difficulties with executive functions: Endorsed. As stated above, she described a lifelong history of  trouble with organization, multi-tasking, and indecision. She did not report prominent difficulties with impulsivity. Her husband previously stated that she had seemed more argumentative and perhaps more defensive surrounding cognitive lapses lately. However, he did not report prominent personality changes surrounding disinhibition or  behavioral dysregulation.  Difficulties with emotion regulation: Denied. Difficulties with receptive language: Denied. Difficulties with word finding: Endorsed. Decreased visuoperceptual ability: Denied.   Trajectory of deficits: Per Ms. Saal, trouble with word finding first become noticeable after contracting COVID-19 for the first of three times in the spring of 2021. In the time from then up until her March 2025 stroke, difficulties were said to "come and go" rather than represent a consistent symptom. Per her husband, no prominent memory decline was observed in the time between her January 2024 evaluation and March 2025 embolic (suspected) stroke. Since her stroke, her husband described an abrupt decline in cognitive abilities, especially memory. These have demonstrated some gradual improvement in the time since, especially since utilizing outpatient OT/ST services.    Difficulties completing ADLs: Denied prior to her stroke. Since her stroke, her husband manages medications, attributing this to several changes which were made while hospitalized following her stroke. He reported their intention to gradually return these responsibilities to her over time. She has also not driven since her stroke.   Additional Medical History: History of traumatic brain injury/concussion: Denied. History of stroke: See neuroimaging above.  History of seizure activity: Denied. History of known exposure to toxins: Denied. Symptoms of chronic pain: Denied outside of generalized arthritic pain.  Experience of frequent headaches/migraines: Denied.   Sensory changes: She wears glasses with benefit. She previously described experiences where her vision is blurred or she sees wavy lines in her visual field. The cause for this was unknown, although she and her husband theorized that she may be having ocular migraine experiences. Other sensory changes/difficulties (e.g., hearing, taste, smell) were denied.   Balance/coordination difficulties: Denied. She also denied any recent falls.  Other motor difficulties: Denied.   Sleep History: Estimated hours obtained each night: 8+ hours. Sleep duration has been notably increased since her stroke.  Difficulties falling asleep: Denied. Difficulties staying asleep: Denied. Feels rested and refreshed upon awakening: She previously reported generally waking feeling fatigued and her husband noted that she will nap during the day. Current restfulness is difficult to determine given her subacute stroke.    History of snoring: Endorsed. Her husband previously stated that snoring was "not that much" overall. History of waking up gasping for air: Denied. Witnessed breath cessation while asleep: Denied.   History of vivid dreaming: Denied. Excessive movement while asleep: Denied. Instances of acting out her dreams: Denied.  Psychiatric/Behavioral Health History: Depression: She described her current mood as "just fine" and denied to her knowledge any prior mental health concerns or diagnoses. Current or remote suicidal ideation, intent, or plan was denied.  Anxiety: Denied. Mania: Denied. Trauma History: Denied. Visual/auditory hallucinations: Denied. Delusional thoughts: Denied.   Tobacco: Denied. Alcohol: She denied current alcohol consumption as well as a history of problematic alcohol abuse or dependence.  Recreational drugs: Denied.  Family History: Problem Relation Age of Onset   Melanoma Mother    Kidney disease Father    Diabetes Father    Heart disease Father    Breast cancer Paternal Grandmother    ADD / ADHD Other        multiple family members   Colon cancer Neg Hx    Esophageal cancer Neg Hx    Rectal  cancer Neg Hx    Stomach cancer Neg Hx    This information was confirmed by Ms. Busbee.  Academic/Vocational History: Highest level of educational attainment: 13 years. She graduated from high school and completed one  additional year of college. 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 She described herself as an average (A/B/C) student in academic settings. She alluded to a weakness with reading, stating that she has always needed to re-read passages several times in order for information to sink in. This was attributed to her ADHD history.  History of developmental delay: Denied. History of grade repetition: Denied. Enrollment in special education courses: Denied. History of LD: Denied.   Employment: Retired. She previously worked as a Armed forces operational officer.  Evaluation Results:   Behavioral Observations: Ms. Kowaleski was accompanied by her husband, arrived to her appointment on time, and was appropriately dressed and groomed. She appeared alert. Observed gait and station were within normal limits. Gross motor functioning appeared intact upon informal observation and no abnormal movements (e.g., tremors) were noted. Her affect was generally relaxed and positive. Spontaneous speech was fluent. Word finding difficulties were observed during interview. Thought processes were coherent, organized, and normal in content. Insight into her cognitive difficulties appeared adequate.   During testing, she appeared slow to process new information. There were several times where she started tasks with an initial burst of energy, only to slow as the task progressed and exhibit a noteworthy reduction in speech volume. Sustained attention was appropriate. Word finding difficulties were quite noteworthy during language testing. Task engagement was adequate and she persisted when challenged. She fatigued as the evaluation progressed. After the midway point, she commented "I'm all used up." She persisted some with encouragement but ultimately required the testing battery to be abbreviated. Overall, Ms. Keadle was cooperative with the clinical interview and subsequent testing procedures.    Adequacy of Effort: The validity of neuropsychological testing is limited by the extent to which the individual being tested may be assumed to have exerted adequate effort during testing. Ms. Pedraza expressed her intention to perform to the best of her abilities and exhibited adequate task engagement and persistence. Scores across stand-alone and embedded performance validity measures were within expectation. As such, the results of the current evaluation are believed to be a valid representation of Ms. Meulemans's current cognitive functioning.  Test Results: Ms. Tellefsen was fully oriented at the time of the current evaluation.  Intellectual abilities based upon educational and vocational attainment were estimated to be in the average range. Premorbid abilities were estimated to be within the well above average range based upon a single-word reading test.   Processing speed was exceptionally low to well below average. Basic attention was average. More complex attention (e.g., working memory) was also average. Executive functioning was exceptionally low to below average.  Receptive language abilities were unable to be directly assessed due to increasing fatigue. Likewise, Ms. Groesbeck did not exhibit any difficulties comprehending task instructions and answered all questions asked of her appropriately. Assessed expressive language was impaired. Sentence repetition was below average, phonemic fluency was exceptionally low, semantic fluency was exceptionally low to well below average, and confrontation naming was exceptionally low. Writing samples were unable to be assessed.    Assessed visuospatial/visuoconstructional abilities were mildly variable but overall appropriate, ranging from the below average to well above average normative ranges.    Learning (i.e., encoding) of novel verbal information was below average. Spontaneous delayed recall (i.e., retrieval) of  previously learned  information was exceptionally low across a list learning task but below average to average across story and figure-based tasks. Retention rates were 0% across a list learning task, 50% across a story learning task, and 60% across a figure drawing task. Performance across recognition tasks was average, suggesting evidence for information consolidation.   Results of emotional screening instruments suggested that recent symptoms of generalized anxiety were in the minimal range, while symptoms of depression were within normal limits. A screening instrument assessing recent sleep quality suggested the presence of minimal sleep dysfunction.  Table of Scores:   Note: This summary of test scores accompanies the interpretive report and should not be considered in isolation without reference to the appropriate sections in the text. Descriptors are based on appropriate normative data and may be adjusted based on clinical judgment. Terms such as "Within Normal Limits" and "Outside Normal Limits" are used when a more specific description of the test score cannot be determined. Descriptors refer to the current evaluation only.         Percentile - Normative Descriptor > 98 - Exceptionally High 91-97 - Well Above Average 75-90 - Above Average 25-74 - Average 9-24 - Below Average 2-8 - Well Below Average < 2 - Exceptionally Low        Validity: January 2024 Current  DESCRIPTOR        DCT: --- --- --- Within Normal Limits  RBANS EI: --- --- --- Within Normal Limits  WAIS-IV RDS: --- --- --- Within Normal Limits        Orientation:       Raw Score Raw Score Percentile   NAB Orientation, Form 1 29/29 29/29 --- ---        Cognitive Screening:       Raw Score Raw Score Percentile   SLUMS: 24/30 26/30 --- ---        RBANS, Form A: Standard Score/ Scaled Score Standard Score/ Scaled Score Percentile   Total Score 98 74 4 Well Below Average  Immediate Memory 97 81 10 Below Average    List Learning 10 6 9   Below Average    Story Memory 9 7 16  Below Average  Visuospatial/Constructional 121 102 55 Average    Figure Copy 14 14 91 Well Above Average    Line Orientation 18/20 14/20 17-25 Below Average to  Average  Language 71 40 <1 Exceptionally Low    Picture Naming 8/10 4/10 <2 Exceptionally Low    Semantic Fluency 5 1 <1 Exceptionally Low  Attention 100 82 12 Below Average    Digit Span 12 10 50 Average    Coding 8 4 2  Well Below Average  Delayed Memory 107 93 32 Average    List Recall 5/10 0/10 <2 Exceptionally Low    List Recognition 20/20 20/20 51-75 Average    Story Recall 10 6 9  Below Average    Story Recognition 12/12 12/12 69+ Average    Figure Recall 12 10 50 Average    Figure Recognition 6/8 6/8 30-52 Average         Intellectual Functioning:       Standard Score Standard Score Percentile   Test of Premorbid Functioning: 117 122 93 Well Above Average        Attention/Executive Function:      Trail Making Test (TMT): Raw Score (T Score) Raw Score (T Score) Percentile     Part A 47 secs.,  0 errors (39) 58 secs.,  0 errors (36)  8 Well Below Average    Part B 128 secs.,  0 errors (38) 238 secs.,  0 errors (32) 4 Well Below Average          Scaled Score Scaled Score Percentile   WAIS-IV Digit Span: 9 10 50 Average    Forward 9 10 50 Average    Backward 9 9 37 Average    Sequencing 10 9 37 Average        D-KEFS Color-Word Interference Test: Raw Score (Scaled Score) Raw Score (Scaled Score) Percentile     Color Naming 32 secs. (11) 50 secs. (3) 1 Exceptionally Low    Word Reading 21 secs. (12) 37 secs. (4) 2 Well Below Average    Inhibition 56 secs. (13) Discontinued <1 Impaired      Total Errors 0 errors (13) --- --- ---    Inhibition/Switching 158 secs. (1) Discontinued --- Impaired      Total Errors 3 errors (10) --- --- ---        D-KEFS Verbal Fluency Test: Raw Score (Scaled Score) Raw Score (Scaled Score) Percentile     Letter Total Correct 26 (7) 13 (3) 1  Exceptionally Low    Category Total Correct 25 (7) 19 (4) 2 Well Below Average    Category Switching Total Correct 11 (9) 9 (6) 9 Below Average    Category Switching Accuracy 10 (9) 7 (6) 9 Below Average      Total Set Loss Errors 1 (11) 0 (13) 84 Above Average      Total Repetition Errors 1 (12) 0 (13) 84 Above Average        Language:       Raw Score Raw Score Percentile   Sentence Repetition: 14/22 13/22 11 Below Average        Verbal Fluency Test: Raw Score (T Score) Raw Score (T Score) Percentile     Phonemic Fluency (FAS) 28 (38) 13 (24) <1 Exceptionally Low    Animal Fluency 8 (22) 9 (28) 2 Well Below Average         NAB Language Module, Form 1: T Score T Score Percentile     Auditory Comprehension 58 --- --- ---    Naming 25/31 (30) 15/31 (19) <1 Exceptionally Low    Writing 54 --- --- ---        Visuospatial/Visuoconstruction:       Raw Score Raw Score Percentile   Clock Drawing: 10/10 10/10 --- Within Normal Limits         Scaled Score Scaled Score Percentile   WAIS-IV Block Design: --- 10 50 Average  WAIS-IV Matrix Reasoning: --- 9 37 Average  WAIS-IV Visual Puzzles: --- 8 25 Average        Mood and Personality:       Raw Score Raw Score Percentile   Geriatric Depression Scale: 10 9 --- Within Normal Limits  Geriatric Anxiety Scale: 2 8 --- Minimal    Somatic 1 4 --- Minimal    Cognitive 0 2 --- Minimal    Affective 1 2 --- Minimal        Additional Questionnaires:       Raw Score Raw Score Percentile   PROMIS Sleep Disturbance Questionnaire: 11 8 --- None to Slight   Informed Consent and Coding/Compliance:   The current evaluation represents a clinical evaluation for the purposes previously outlined by the referral source and is in no way reflective of a forensic evaluation.   Ms. Lietz was provided with  a verbal description of the nature and purpose of the present neuropsychological evaluation. Also reviewed were the foreseeable risks and/or  discomforts and benefits of the procedure, limits of confidentiality, and mandatory reporting requirements of this provider. The patient was given the opportunity to ask questions and receive answers about the evaluation. Oral consent to participate was provided by the patient.   This evaluation was conducted by Melville Stade, Ph.D., ABPP-CN, board certified clinical neuropsychologist. Ms. Tassinari completed a clinical interview with Dr. Kitty Perkins, billed as one unit [phone], and 140 minutes of cognitive testing and scoring, billed as one unit [phone] and four additional units [id number]. Psychometrist Arnulfo Larch, B.S. assisted Dr. Kitty Perkins with test administration and scoring procedures. As a separate and discrete service, one unit [phone] and two units 96133 (160 minutes) were billed for Dr. Arsenio Larger time spent in interpretation and report writing.

## 2023-12-19 NOTE — Progress Notes (Signed)
   Psychometrician Note   Cognitive testing was administered to Gabrielle Vargas by Arnulfo Larch, B.S. (psychometrist) under the supervision of Dr. Melville Stade, Ph.D., ABPP, licensed psychologist on 12/19/2023. Gabrielle Vargas did not appear overtly distressed by the testing session per behavioral observation or responses across self-report questionnaires. Rest breaks were offered.    The battery of tests administered was selected by Dr. [name] C. Merz, Ph.D., ABPP with consideration to Gabrielle Vargas's current level of functioning, the nature of her symptoms, emotional and behavioral responses during interview, level of literacy, observed level of motivation/effort, and the nature of the referral question. This battery was communicated to the psychometrist. Communication between Dr. Melville Stade, Ph.D., ABPP and the psychometrist was ongoing throughout the evaluation and Dr. Melville Stade, Ph.D., ABPP was immediately accessible at all times. Dr. [name] C. Merz, Ph.D., ABPP provided supervision to the psychometrist on the date of this service to the extent necessary to assure the quality of all services provided.    Gabrielle Vargas will return within approximately 1-2 weeks for an interactive feedback session with Dr. Kitty Perkins at which time her test performances, clinical impressions, and treatment recommendations will be reviewed in detail. Gabrielle Vargas understands she can contact our office should she require our assistance before this time.  A total of 140 minutes of billable time were spent face-to-face with Gabrielle Vargas by the psychometrist. This includes both test administration and scoring time. Billing for these services is reflected in the clinical report generated by Dr. Melville Stade, Ph.D., ABPP  This note reflects time spent with the psychometrician and does not include test scores or any clinical interpretations made by Dr. Kitty Perkins. The full report will follow in a  separate note.

## 2023-12-20 ENCOUNTER — Ambulatory Visit

## 2023-12-20 ENCOUNTER — Ambulatory Visit: Attending: Physical Medicine & Rehabilitation | Admitting: Occupational Therapy

## 2023-12-20 ENCOUNTER — Encounter: Payer: Self-pay | Admitting: Psychology

## 2023-12-20 DIAGNOSIS — I6340 Cerebral infarction due to embolism of unspecified cerebral artery: Secondary | ICD-10-CM | POA: Diagnosis not present

## 2023-12-20 DIAGNOSIS — R4701 Aphasia: Secondary | ICD-10-CM

## 2023-12-20 DIAGNOSIS — I69318 Other symptoms and signs involving cognitive functions following cerebral infarction: Secondary | ICD-10-CM | POA: Diagnosis not present

## 2023-12-20 DIAGNOSIS — R41840 Attention and concentration deficit: Secondary | ICD-10-CM | POA: Insufficient documentation

## 2023-12-20 DIAGNOSIS — R41841 Cognitive communication deficit: Secondary | ICD-10-CM

## 2023-12-20 DIAGNOSIS — M6281 Muscle weakness (generalized): Secondary | ICD-10-CM | POA: Insufficient documentation

## 2023-12-20 NOTE — Therapy (Signed)
 OUTPATIENT SPEECH LANGUAGE PATHOLOGY TREATMENT   [name] MRN: [id number] DOB:Dec 29, 1950, 73 y.o., female Today's Date: 12/20/2023  PCP: [name], NP REFERRING PROVIDER: [name], MD  END OF SESSION:  End of Session - 12/20/23 1448     Visit Number 3    Number of Visits 17    Date for SLP Re-Evaluation 02/02/24    SLP Start Time 1447    SLP Stop Time  1530    SLP Time Calculation (min) 43 min    Activity Tolerance Patient tolerated treatment well              Past Medical History:  Diagnosis Date   Acute combined systolic and diastolic heart failure 10/11/2023   ADHD (attention deficit hyperactivity disorder) 05/09/2007   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; multiple punctate foci of restricted diffusion in bilateral frontal lobes and the left parietal lobe   Diverticulitis of colon (without mention of hemorrhage) 09/17/2013   Essential hypertension 10/09/2017   HFrEF (heart failure with reduced ejection fraction) 10/10/2023   History of COVID-19 2021   Hyperlipidemia    Hypothyroidism    Low back pain radiating to right leg 10/09/2017   Mild cognitive impairment of uncertain or unknown etiology 08/04/2022   Mild intermittent asthma with acute exacerbation 05/30/2017   Mild neurocognitive disorder due to multiple etiologies 12/19/2023   NSVT (nonsustained ventricular tachycardia) 10/12/2023   Osteoarthritis    Postmenopausal atrophic vaginitis, severe 08/18/2008   Right knee pain 03/13/2014   S/P arthroscopy of right knee 06/03/2014   Vitamin B12 deficiency    Past Surgical History:  Procedure Laterality Date   ABDOMINAL HYSTERECTOMY     BREAST SURGERY     CARPAL TUNNEL RELEASE Bilateral    CERVICAL DISC ARTHROPLASTY     CESAREAN SECTION     x2   left shoulder surgery     LOOP RECORDER INSERTION N/A 10/12/2023   Procedure: LOOP RECORDER INSERTION;  Surgeon: Boyce Byes, MD;  Location:  [facility];  Service: Cardiovascular;  Laterality: N/A;   MENISCUS REPAIR Right 2017   REDUCTION MAMMAPLASTY Bilateral    right shoulder     TONSILLECTOMY     Patient Active Problem List   Diagnosis Date Noted   Mild neurocognitive disorder due to multiple etiologies 12/19/2023   NSVT (nonsustained ventricular tachycardia) 10/12/2023   Acute combined systolic and diastolic heart failure 10/11/2023   HFrEF (heart failure with reduced ejection fraction) 10/10/2023   CVA (cerebral vascular accident) 10/08/2023   Mild cognitive impairment of uncertain or unknown etiology 08/04/2022   Vitamin B12 deficiency    Osteoarthritis 08/03/2022   Hyperlipidemia 08/03/2022   Essential hypertension 10/09/2017   Low back pain radiating to right leg 10/09/2017   Mild intermittent asthma with acute exacerbation 05/30/2017   S/P arthroscopy of right knee 06/03/2014   Right knee pain 03/13/2014   Diverticulitis of colon (without mention of hemorrhage) 09/17/2013   Postmenopausal atrophic vaginitis, severe 08/18/2008   Hypothyroidism 05/09/2007   ADHD (attention deficit hyperactivity disorder) 05/09/2007    ONSET DATE:  10/08/23  REFERRING DIAG: W29.562 (ICD-10-CM) - Aphasia as late effect of stroke  THERAPY DIAG:  Cognitive communication deficit  Aphasia  Rationale for Evaluation and Treatment: Rehabilitation  SUBJECTIVE:   SUBJECTIVE STATEMENT: "We have some homework - that we- brought."  Pt accompanied by: self   PERTINENT HISTORY: HPI: Pt presented to [facility] ED on 10/08/2023 with sudden onset  of left facial droop and left-sided weakness. ADHD, HTN, diverticulitis, HLD, hypothyroidism, low back pain, neck fusion, mild cognitive impairment (dx Jan 2024), cervical disc arthroplasty, OA in R knee, asthma   PAIN:  Are you having pain? No  FALLS: Has patient fallen in last 6 months?  No  LIVING ENVIRONMENT: Lives with: lives with their spouse Lives in: House/apartment PLOF:   Level of assistance: Independent with ADLs, Independent with IADLs Employment: Retired - Armed forces operational officer  PATIENT GOALS: Improve language function, memory  OBJECTIVE:  Note: Objective measures were completed at Evaluation unless otherwise noted.  DIAGNOSTIC FINDINGS:  MRI 10/09/23 IMPRESSION: 1. Findings compatible with multiple punctate acute infarcts in bilateral frontal and left parietal lobes. Given involvement of multiple vascular territories, consider an embolic etiology. 2. Significantly motion limited study.   Date of Discharge from SLP service:October 19, 2023 Clinical Impression/Discharge Summary: Pt has made great gains and has met 4 of 6 LTG's this admission due to improved cognition and language. Pt is currently an overall mod I A for orientation tasks, though requires up to [facility] for attention. Patient requires mod I A for expressive language, though continues to require occasional min-modA for complex receptive language tasks.  Pt/family education complete and pt will discharge home with 24 hour supervision from friends/family/etc. Pt would benefit from OP f/u ST services to maximize cognition and communication in order to maximize functional independence.     STANDARDIZED ASSESSMENTS:   Cognitive Linguistic Quick Test  AGE - 70-89  The Cognitive Linguistic Quick Test (CLQT) was administered to assess the relative status of five cognitive domains: attention, memory, language, executive functioning, and visuospatial skills. Scores from 10 tasks were used to estimate severity ratings (standardized for age groups 18-69 years and 70-89 years) for each domain, a clock drawing task, as well as an overall composite severity rating of cognition.     Task Score Criterion Cut Scores  Personal Facts 8/8 8  Symbol Cancellation 12/12 10  Confrontation Naming 10/10 10  Clock Drawing  12/13 11  Story Retelling 8/10 5  Symbol Trails 10/10 6  Generative Naming 3/9 4  Design Memory 4/6 4   Mazes  4/8 4  Design Generation 3/13 5   Cognitive Domain Composite Score Severity Rating  Attention 181/215 WNL  Memory 147/185 WNL  Executive Function 20/40 Low-WNL  Language 29/37 Low-WNL  Visuospatial Skills 75/105 WNL  Clock Drawing  12/13 WNL  Composite Severity Rating  WNL     PATIENT REPORTED OUTCOME MEASURES (PROM): Communication Effectiveness Survey: 12/20/23 - pt scored herself 18/32, with higher scores indicating less of a negative effect of pt's deficits on daily life and QOL.                                                                                                                              TREATMENT DATE:  Semantic Feature Analysis (SFA)  12/20/23: PROM provided today, with score above. [name] cueing pt  about the accuracy of her answers. Needs anomia compensations next session. SLP asked [name] to fill out a CES as well for SLP to compare. Pt completed "3 clues" task with 90% success, however only named average 2 items in a common category for divergent naming prior to requiring consistent mod-max  A. SLP provided letter fill ins for homework, and divergent naming for flowers in her yard, and for sports.  12/14/23: PT NEEDS PROM NEXT SESSION. SLP completed CLQT today. From pt's CLQT score it would indicate that although pt's Composite Score is WNL she had difficulty with processing speed (mazes, design generation), problem solving Forensic psychologist), memory Training and development officer memory), and language (generative naming). Goals added.   12/07/23: SLP educated pt and husband [name]) with describing phonemic cues, as this appeared to help pt during BNT. SLP provided examples during BNT and told [name] to assist pt in this way if he is sure of the word pt means to say. Secondly, SLP educated pt and husband on how to complete semantic feature analysis (SFA) at home with words/nouns pt experiences anomia with. SLP explained benefits of SFA to pt and husband. [name] took notes during  session. SLP provided handouts for this framework.  PATIENT EDUCATION: Education details: see "treatment date" Person educated: Patient and Spouse Education method: Explanation Education comprehension: verbalized understanding and needs further education   GOALS: Goals reviewed with patient? Yes in general  SHORT TERM GOALS: Target date: 01/05/24  Pt will complete cognitive linguistic assessment in first 2 sessions  Baseline: Goal status: met  2.  Pt will successfully use anomia compensations with min A to do so, for 10 minutes functional simple conversation in 2 sessions Baseline:  Goal status: INITIAL  3.  Pt will demo how to use SFA with rare min A in 3 sessions Baseline:  Goal status: INITIAL  4.  Pt will demo [facility] problem solving skills in in simple cognitive linguistic tasks in 2 sessions Baseline:  Goal status: INITIAL   LONG TERM GOALS: Target date: 02/02/24  Pt will improve PROM compared to initial administration Baseline:  Goal status: INITIAL  2.  Pt will participate in 10 minutes mod complex conversation with functional expressive language (using anomia compensations) in 3 sessions Baseline:  Goal status: INITIAL  3.  Pt will demo functional problem solving skills in practical cognitive linguistic tasks in 3 sessions Baseline:  Goal status: INITIAL   ASSESSMENT:  CLINICAL IMPRESSION: Patient is a 73 y.o. F who was seen today for assessment of aphasia in light of bilateral frontal CVA, and parietal CVA. Given her premorbid dx of ADHD and MCI and OT focus on other areas identified on CLQT, SLP will focus on problem solving in functional cognitive linguistic tasks. She also demonstrated s/sx of cognitive linguistic impairment requiring a full evaluation in the first 2 sessions. Pt with premorbid dx of ADHD and MCI.   OBJECTIVE IMPAIRMENTS: include attention, memory, awareness, and aphasia. These impairments are limiting patient from managing medications,  managing appointments, managing finances, household responsibilities, ADLs/IADLs, and effectively communicating at home and in community. Factors affecting potential to achieve goals and functional outcome are ability to learn/carryover information and previous level of function. Patient will benefit from skilled SLP services to address above impairments and improve overall function.  REHAB POTENTIAL: Good  PLAN:  SLP FREQUENCY: 2x/week  SLP DURATION: 8 weeks  PLANNED INTERVENTIONS: Language facilitation, Environmental controls, Cueing hierachy, Cognitive reorganization, Internal/external aids, Functional tasks, Multimodal communication approach, SLP instruction and feedback, Compensatory strategies,  Patient/family education, [phone] Treatment of speech (30 or 45 min) , and [id number] (Standardized cognitive assessment)    Dantonio Justen, CCC-SLP 12/20/2023, 2:48 PM

## 2023-12-20 NOTE — Therapy (Signed)
 OUTPATIENT OCCUPATIONAL THERAPY NEURO Treatment  [name] MRN: [id number] DOB:[birth year], 73 y.o., female Today's Date: 12/20/2023  PCP: [name], NP REFERRING PROVIDER: [name], MD  END OF SESSION:  OT End of Session - 12/20/23 1439     Visit Number 5    Number of Visits 11    Date for OT Re-Evaluation 01/05/24    Authorization Type [employer]    OT Start Time 1319    OT Stop Time 1402    OT Time Calculation (min) 43 min    Activity Tolerance Patient tolerated treatment well   RUE thumb pain and swelling   Behavior During Therapy WFL for tasks assessed/performed                 Past Medical History:  Diagnosis Date   Acute combined systolic and diastolic heart failure 10/11/2023   ADHD (attention deficit hyperactivity disorder) 05/09/2007   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; multiple punctate foci of restricted diffusion in bilateral frontal lobes and the left parietal lobe   Diverticulitis of colon (without mention of hemorrhage) 09/17/2013   Essential hypertension 10/09/2017   HFrEF (heart failure with reduced ejection fraction) 10/10/2023   History of COVID-19 2021   Hyperlipidemia    Hypothyroidism    Low back pain radiating to right leg 10/09/2017   Mild cognitive impairment of uncertain or unknown etiology 08/04/2022   Mild intermittent asthma with acute exacerbation 05/30/2017   Mild neurocognitive disorder due to multiple etiologies 12/19/2023   NSVT (nonsustained ventricular tachycardia) 10/12/2023   Osteoarthritis    Postmenopausal atrophic vaginitis, severe 08/18/2008   Right knee pain 03/13/2014   S/P arthroscopy of right knee 06/03/2014   Vitamin B12 deficiency    Past Surgical History:  Procedure Laterality Date   ABDOMINAL HYSTERECTOMY     BREAST SURGERY     CARPAL TUNNEL RELEASE Bilateral    CERVICAL DISC ARTHROPLASTY     CESAREAN SECTION     x2   left  shoulder surgery     LOOP RECORDER INSERTION N/A 10/12/2023   Procedure: LOOP RECORDER INSERTION;  Surgeon: Boyce Byes, MD;  Location: [facility];  Service: Cardiovascular;  Laterality: N/A;   MENISCUS REPAIR Right 2017   REDUCTION MAMMAPLASTY Bilateral    right shoulder     TONSILLECTOMY     Patient Active Problem List   Diagnosis Date Noted   Mild neurocognitive disorder due to multiple etiologies 12/19/2023   NSVT (nonsustained ventricular tachycardia) 10/12/2023   Acute combined systolic and diastolic heart failure 10/11/2023   HFrEF (heart failure with reduced ejection fraction) 10/10/2023   CVA (cerebral vascular accident) 10/08/2023   Mild cognitive impairment of uncertain or unknown etiology 08/04/2022   Vitamin B12 deficiency    Osteoarthritis 08/03/2022   Hyperlipidemia 08/03/2022   Essential hypertension 10/09/2017   Low back pain radiating to right leg 10/09/2017   Mild intermittent asthma with acute exacerbation 05/30/2017   S/P arthroscopy of right knee 06/03/2014   Right knee pain 03/13/2014   Diverticulitis of colon (without mention of hemorrhage) 09/17/2013   Postmenopausal atrophic vaginitis, severe 08/18/2008   Hypothyroidism 05/09/2007   ADHD (attention deficit hyperactivity disorder) 05/09/2007    ONSET DATE: 10/08/23  REFERRING DIAG: I63.9 (ICD-10-CM) - Cerebral infarction, unspecified  THERAPY DIAG:  Attention and concentration deficit  Other symptoms and signs involving cognitive functions following cerebral infarction  Muscle weakness (generalized)  Rationale for Evaluation and Treatment:  Rehabilitation  SUBJECTIVE:   SUBJECTIVE STATEMENT: Pt reports things are going well, but that when she is walking she is having instances where she will stumble forwards and to the left.    Pt accompanied by: self and spouse  PERTINENT HISTORY: ADHD, HTN, diverticulitis, HLD, hypothyroidism, low back pain, neck fusion, mild cognitive  impairment, OA in R knee, asthma   presented to [facility] ED on 10/08/2023 with sudden onset of left facial droop, left-sided weakness,and L visual cut. MRI: Multiple punctate acute infarcts bilateral frontal and left parietal lobes. EEG suggestive of moderate diffuse encephalopathy.   PRECAUTIONS: Fall  WEIGHT BEARING RESTRICTIONS: No  PAIN:  Are you having pain? No pain  FALLS: Has patient fallen in last 6 months? No  LIVING ENVIRONMENT: Lives with: lives with their spouse Lives in: House/apartment Stairs: Yes: Internal: bedroom/bathroom on main floor, does have a full flight of steps - spouse's office is upstairs  steps; one rail and External: 2 steps;   Has following equipment at home: shower chair and hand held shower head  PLOF: Independent, Independent with basic ADLs, and Leisure: walking up to 4-6 hours per day  PATIENT GOALS: "I really don't know"  OBJECTIVE:  Note: Objective measures were completed at Evaluation unless otherwise noted.  HAND DOMINANCE: Right  ADLs: Overall ADLs: Mod I with bathing and dressing, pt's spouse will go in and out of bathroom while pt in shower providing distant supervision to Mod I level Transfers/ambulation related to ADLs: Mod I without AD, getting into high bed without any issue  IADLs: Shopping: pt gets overwhelmed with everything that is going on, easily distracted  Meal Prep: spouse is doing most of the cooking right now as pt forgetful with sequencing and ingredients, and easily distracted Community mobility: not cleared to drive at this time Medication management: spouse is assisting now, he will fill up a daily pill box for pt   MOBILITY STATUS: Mod I without AD  POSTURE COMMENTS:  No Significant postural limitations  ACTIVITY TOLERANCE: Activity tolerance: pt reports worn out after 30 min walk (where as they would walk up to 2 hrs at a time before stroke).  Pt is not sleeping as much during the day time.  FUNCTIONAL OUTCOME  MEASURES: 9 hole peg test: Right: 36.54 sec; Left: 34.06 sec Trail Making: 42.47 sec for Trail A   UPPER EXTREMITY ROM:  WFL bilaterally  UPPER EXTREMITY MMT:     MMT Right eval Left eval  Shoulder flexion 4+ 4-  Shoulder abduction    Shoulder adduction    Shoulder extension    Shoulder internal rotation    Shoulder external rotation    Middle trapezius    Lower trapezius    Elbow flexion 4 4  Elbow extension 4- 4-  Wrist flexion    Wrist extension    Wrist ulnar deviation    Wrist radial deviation    Wrist pronation    Wrist supination    (Blank rows = not tested)  COORDINATION: 9 Hole Peg test: Right: 36.54 sec; Left: 34.06 sec Box and Blocks:  Right 43 blocks, Left 36blocks - Pt requiring cues x3 to remember to only pick up one block at a time when completing with Right; requiring cues to go over barrier instead of around (possibly slowing down pace even more).  SENSATION: WFL  COGNITION: Overall cognitive status: Impaired and History of cognitive impairments - at baseline Pt able to recall 3 words: sock blue bed after 5 mins  Pt with difficulty with word finding and memory  VISION: Subjective report: wears glasses, reported f/u with neurology and ophthalmologist who report to wait for further assessment to allow recovery s/p CVA Baseline vision: Wears glasses all the time  VISION ASSESSMENT: To be further assessed in functional context Eye alignment: WFL Ocular ROM: WFL Gaze preference/alignment: WDL Tracking/Visual pursuits: TBA Visual Fields: TBA in functional context   Bell cancellation test: locating 32/35 in 3:00, pt then able to locate remaining 2/3 in 4:20 before stating "I think that is all"  PERCEPTION: Impaired  PRAXIS: Impaired: Initiation, Motor planning, and Organization  OBSERVATIONS: Difficult to assess vision, attention, and strength as pt requiring increased cueing and repeat directions.  Unsure full attention and effort during MMT  this session.  Pt forgetting instructions during coordination assessment, requiring wait time for recall and/or additional cues.                                                                                                                             TREATMENT DATE:  12/20/23 Logic puzzles: 3x3 puzzle to improve sequencing, deductive reasoning, memory and recall of unfamiliar activities. Pt completed task with mod v/c for deductive reasoning components of task.  OT provided pt and spouse with sample logic questions (without grid) to challenge sequencing and deductive reasoning. Tangram: engaged in tangram puzzle to focus on vision, sequencing, attention, and coordination.  Pt demonstrating occasional difficulty with picking up small pieces with R hand.  OT then incorporating conversation while pt completing to challenge alternating attention.  Pt demonstrating good attention to task, with mild pauses to engage in conversation but able to return to task. Community reintegration: engaged in education on slowly returning to community reintegration as pt expressing desire to return to church. Spouse expressing that an MD had warned against being in places with too many people who may be unknowingly sick and adding a recovery from illness to detract from focus on recovery from stroke. OT encouraged pt to f/u with MD in regards to when she can increase community participation from a health standpoint.  OT educated on going out during off peak times, getting to church or parties early and sitting down before the rush and letting people come to her vs walking through/around crowds.     PATIENT EDUCATION: Education details: see today's tx above Person educated: Patient and Spouse Education method: Explanation and Handouts Education comprehension: verbalized understanding and needs further education  HOME EXERCISE PROGRAM: Provided written suggestions for keeping thinking skills sharp - plan to provide  printout at future session 12/13/23 - memory compensation strategies and activities to keep thinking skills sharp (see pt instructions)   GOALS: Goals reviewed with patient? Yes  SHORT TERM GOALS: Target date: 12/15/23  Pt will be independent with coordination HEP with visual handouts for recall. Baseline: new to OPOT Goal status: in progress  2.  Pt will demonstrate understanding of memory compensations and ways to keep thinking skills  sharp Baseline: STM deficits and impaired initiation Goal status: in progress  3.  Pt will demonstrate and/or verbalize understanding of visual attention exercises and compensatory strategies for improved visual scanning and attention. Baseline: left field cut Goal status: in progress  4.  Pt will demonstrate improved coordination, attention to task, and recall of instructions by completing box and blocks assessment with improved score by 6 blocks bilaterally. Baseline: Right 43 blocks, Left 36blocks - Pt requiring cues x3 to remember to only pick up one block at a time when completing with Right; requiring cues to go over barrier instead of around (possibly slowing down pace even more). Goal status: in progress   LONG TERM GOALS: Target date: 01/05/24  Pt will verbalize understanding of task modifications, adaptive strategies, and/or potential AE needs to increase ease, safety, and independence w/ IADLs. Baseline: decreased engagement in IADLs due to impaired memory and attention Goal status: in progress  2.  Pt will complete simulated medication management activity w/ Supervision by discharge, incorporating compensatory strategies/AE prn Baseline: spouse managing meds at this time, was not prior to CVA Goal status: in progress  3.  Pt will demonstrate ability to sequence simple functional task (simple snack prep, laundry task, etc) at Mod I level with good safety awareness and ability to utilize memory strategies for recall. Baseline:  Goal  status: in progress  4.  Pt will navigate a moderately busy environment, completing dual task activity and/or following multi-step commands with 90% accuracy Baseline:  Goal status: in progress  5.  Pt will demonstrate improved initiation and sequencing to be able to navigate use of personal cell phone and send text messages in <15 mins. Baseline: spouse reporting 45 mins to reply to test message Goal status: in progress   ASSESSMENT:  CLINICAL IMPRESSION: Pt tolerated tasks well this session.  Pt demonstrating improved attention to L visual field both with table top task and when walking from waiting room to treatment room. Pt tolerated tasks well and reported decreased pain and improved function of R thumb. Pt continues to verbalize decreased endurance compared to prior to stroke and expressing desire to return to community routine, such as going to church.  OT receptive to education on going out during off peak times, but recommending f/u with MD as he advised against a lot of community outings.  Pt will continue to benefit from skilled occupational therapy services to address strength and coordination, GM/FM control, cognition, safety awareness, introduction of compensatory strategies/AE prn, visual-perception, and implementation of an HEP to improve participation and safety during ADLs and IADLs.    PERFORMANCE DEFICITS: in functional skills including ADLs, IADLs, coordination, strength, Fine motor control, Gross motor control, endurance, decreased knowledge of precautions, vision, and UE functional use, cognitive skills including attention, energy/drive, memory, problem solving, safety awareness, and sequencing, and psychosocial skills including environmental adaptation and routines and behaviors.     PLAN:  OT FREQUENCY: 1-2x/week  OT DURATION: 6 weeks  PLANNED INTERVENTIONS: 97168 OT Re-evaluation, 97535 self care/ADL training, 40981 therapeutic exercise, 97530 therapeutic  activity, 97112 neuromuscular re-education, functional mobility training, visual/perceptual remediation/compensation, psychosocial skills training, energy conservation, coping strategies training, patient/family education, and DME and/or AE instructions  RECOMMENDED OTHER SERVICES: SLP  CONSULTED AND AGREED WITH PLAN OF CARE: Patient and family member/caregiver  PLAN FOR NEXT SESSION:   Cognitive/motor dual tasking, sequencing, recall of 2 step directions  Phone use  Further assess during functional tasks, review PRN visual compensatory strategies (especially during functional tasks)  Visual  scanning tasks  Sequencing tasks    Anthonette Kinsman, OTR/L 12/20/2023, 2:39 PM  [facility] at [facility] [address] [place], [address] [place], [place] [address] Phone # [phone] Fax # [phone]

## 2023-12-22 ENCOUNTER — Ambulatory Visit (INDEPENDENT_AMBULATORY_CARE_PROVIDER_SITE_OTHER)

## 2023-12-22 DIAGNOSIS — I639 Cerebral infarction, unspecified: Secondary | ICD-10-CM

## 2023-12-23 ENCOUNTER — Ambulatory Visit: Payer: Self-pay | Admitting: Cardiology

## 2023-12-25 ENCOUNTER — Ambulatory Visit: Admitting: Occupational Therapy

## 2023-12-25 ENCOUNTER — Ambulatory Visit

## 2023-12-25 DIAGNOSIS — R4701 Aphasia: Secondary | ICD-10-CM

## 2023-12-25 DIAGNOSIS — R41841 Cognitive communication deficit: Secondary | ICD-10-CM

## 2023-12-25 DIAGNOSIS — R41840 Attention and concentration deficit: Secondary | ICD-10-CM | POA: Diagnosis not present

## 2023-12-25 NOTE — Patient Instructions (Signed)

## 2023-12-25 NOTE — Therapy (Signed)
 OUTPATIENT SPEECH LANGUAGE PATHOLOGY TREATMENT   [name] MRN: [id number] DOB:Jun 12, 1951, 73 y.o., female Today's Date: 12/25/2023  PCP: [name], NP REFERRING PROVIDER: [name], MD  END OF SESSION:  End of Session - 12/25/23 0931     Visit Number 4    Number of Visits 17    Date for SLP Re-Evaluation 02/02/24    SLP Start Time 0850    SLP Stop Time  0930    SLP Time Calculation (min) 40 min    Activity Tolerance Patient tolerated treatment well               Past Medical History:  Diagnosis Date   Acute combined systolic and diastolic heart failure 10/11/2023   ADHD (attention deficit hyperactivity disorder) 05/09/2007   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; multiple punctate foci of restricted diffusion in bilateral frontal lobes and the left parietal lobe   Diverticulitis of colon (without mention of hemorrhage) 09/17/2013   Essential hypertension 10/09/2017   HFrEF (heart failure with reduced ejection fraction) 10/10/2023   History of COVID-19 2021   Hyperlipidemia    Hypothyroidism    Low back pain radiating to right leg 10/09/2017   Mild cognitive impairment of uncertain or unknown etiology 08/04/2022   Mild intermittent asthma with acute exacerbation 05/30/2017   Mild neurocognitive disorder due to multiple etiologies 12/19/2023   NSVT (nonsustained ventricular tachycardia) 10/12/2023   Osteoarthritis    Postmenopausal atrophic vaginitis, severe 08/18/2008   Right knee pain 03/13/2014   S/P arthroscopy of right knee 06/03/2014   Vitamin B12 deficiency    Past Surgical History:  Procedure Laterality Date   ABDOMINAL HYSTERECTOMY     BREAST SURGERY     CARPAL TUNNEL RELEASE Bilateral    CERVICAL DISC ARTHROPLASTY     CESAREAN SECTION     x2   left shoulder surgery     LOOP RECORDER INSERTION N/A 10/12/2023   Procedure: LOOP RECORDER INSERTION;  Surgeon: Boyce Byes, MD;   Location: [facility];  Service: Cardiovascular;  Laterality: N/A;   MENISCUS REPAIR Right 2017   REDUCTION MAMMAPLASTY Bilateral    right shoulder     TONSILLECTOMY     Patient Active Problem List   Diagnosis Date Noted   Mild neurocognitive disorder due to multiple etiologies 12/19/2023   NSVT (nonsustained ventricular tachycardia) 10/12/2023   Acute combined systolic and diastolic heart failure 10/11/2023   HFrEF (heart failure with reduced ejection fraction) 10/10/2023   CVA (cerebral vascular accident) 10/08/2023   Mild cognitive impairment of uncertain or unknown etiology 08/04/2022   Vitamin B12 deficiency    Osteoarthritis 08/03/2022   Hyperlipidemia 08/03/2022   Essential hypertension 10/09/2017   Low back pain radiating to right leg 10/09/2017   Mild intermittent asthma with acute exacerbation 05/30/2017   S/P arthroscopy of right knee 06/03/2014   Right knee pain 03/13/2014   Diverticulitis of colon (without mention of hemorrhage) 09/17/2013   Postmenopausal atrophic vaginitis, severe 08/18/2008   Hypothyroidism 05/09/2007   ADHD (attention deficit hyperactivity disorder) 05/09/2007    ONSET DATE:  10/08/23  REFERRING DIAG: A41.660 (ICD-10-CM) - Aphasia as late effect of stroke  THERAPY DIAG:  Aphasia  Cognitive communication deficit  Rationale for Evaluation and Treatment: Rehabilitation  SUBJECTIVE:   SUBJECTIVE STATEMENT: "How was your weekend?"  Pt accompanied by: self   PERTINENT HISTORY: HPI: Pt presented to [facility] ED on 10/08/2023 with sudden onset of left facial  droop and left-sided weakness. ADHD, HTN, diverticulitis, HLD, hypothyroidism, low back pain, neck fusion, mild cognitive impairment (dx Jan 2024), cervical disc arthroplasty, OA in R knee, asthma   PAIN:  Are you having pain? No  FALLS: Has patient fallen in last 6 months?  No  LIVING ENVIRONMENT: Lives with: lives with their spouse Lives in: House/apartment PLOF:  Level of  assistance: Independent with ADLs, Independent with IADLs Employment: Retired - Armed forces operational officer  PATIENT GOALS: Improve language function, memory  OBJECTIVE:  Note: Objective measures were completed at Evaluation unless otherwise noted.  DIAGNOSTIC FINDINGS:  MRI 10/09/23 IMPRESSION: 1. Findings compatible with multiple punctate acute infarcts in bilateral frontal and left parietal lobes. Given involvement of multiple vascular territories, consider an embolic etiology. 2. Significantly motion limited study.   Date of Discharge from SLP service:October 19, 2023 Clinical Impression/Discharge Summary: Pt has made great gains and has met 4 of 6 LTG's this admission due to improved cognition and language. Pt is currently an overall mod I A for orientation tasks, though requires up to [facility] for attention. Patient requires mod I A for expressive language, though continues to require occasional min-modA for complex receptive language tasks.  Pt/family education complete and pt will discharge home with 24 hour supervision from friends/family/etc. Pt would benefit from OP f/u ST services to maximize cognition and communication in order to maximize functional independence.     STANDARDIZED ASSESSMENTS:   Cognitive Linguistic Quick Test  AGE - 70-89  The Cognitive Linguistic Quick Test (CLQT) was administered to assess the relative status of five cognitive domains: attention, memory, language, executive functioning, and visuospatial skills. Scores from 10 tasks were used to estimate severity ratings (standardized for age groups 18-69 years and 70-89 years) for each domain, a clock drawing task, as well as an overall composite severity rating of cognition.     Task Score Criterion Cut Scores  Personal Facts 8/8 8  Symbol Cancellation 12/12 10  Confrontation Naming 10/10 10  Clock Drawing  12/13 11  Story Retelling 8/10 5  Symbol Trails 10/10 6  Generative Naming 3/9 4  Design Memory 4/6 4  Mazes   4/8 4  Design Generation 3/13 5   Cognitive Domain Composite Score Severity Rating  Attention 181/215 WNL  Memory 147/185 WNL  Executive Function 20/40 Low-WNL  Language 29/37 Low-WNL  Visuospatial Skills 75/105 WNL  Clock Drawing  12/13 WNL  Composite Severity Rating  WNL     PATIENT REPORTED OUTCOME MEASURES (PROM): Communication Effectiveness Survey: 12/20/23 - pt scored herself 18/32, with higher scores indicating less of a negative effect of pt's deficits on daily life and QOL.                                                                                                                              TREATMENT DATE:  Semantic Feature Analysis (SFA)  12/25/23:  Minimal difficulty with letter fill in homework. Focusing on problem solving  in  cognitive linguistics today, SLP assisted pt with scrambled sentences.Four to 5-word sentences were completed with 4/4 success. When pt worked on 7-9 word sentences with SLP she had intermittent difficulty with mental flexibility with word choice. SLP provided mod cues faded to min nonverbal cues. Pt's attention/memory deficit made alternating attention difficult at times so SLP suggested pt write the word order above the words and this helped pt compensate for decr'd alternating attention.  SLP provided compensations for anomia for pt and educated her and [name] about these.   12/20/23: PROM provided today, with score above. [name] cueing pt about the accuracy of her answers. Needs anomia compensations next session. SLP asked [name] to fill out a CES as well for SLP to compare. Pt completed "3 clues" task with 90% success, however only named average 2 items in a common category for divergent naming prior to requiring consistent mod-max  A. SLP provided letter fill ins for homework, and divergent naming for flowers in her yard, and for sports.  12/14/23: PT NEEDS PROM NEXT SESSION. SLP completed CLQT today. From pt's CLQT score it would indicate that  although pt's Composite Score is WNL she had difficulty with processing speed (mazes, design generation), problem solving Forensic psychologist), memory Training and development officer memory), and language (generative naming). Goals added.   12/07/23: SLP educated pt and husband [name]) with describing phonemic cues, as this appeared to help pt during BNT. SLP provided examples during BNT and told [name] to assist pt in this way if he is sure of the word pt means to say. Secondly, SLP educated pt and husband on how to complete semantic feature analysis (SFA) at home with words/nouns pt experiences anomia with. SLP explained benefits of SFA to pt and husband. [name] took notes during session. SLP provided handouts for this framework.  PATIENT EDUCATION: Education details: see "treatment date" Person educated: Patient and Spouse Education method: Explanation, Demonstration, and Handouts Education comprehension: verbalized understanding and needs further education   GOALS: Goals reviewed with patient? Yes in general  SHORT TERM GOALS: Target date: 01/05/24  Pt will complete cognitive linguistic assessment in first 2 sessions  Baseline: Goal status: met  2.  Pt will successfully use anomia compensations with min A to do so, for 10 minutes functional simple conversation in 2 sessions Baseline:  Goal status: INITIAL  3.  Pt will demo how to use SFA with rare min A in 3 sessions Baseline:  Goal status: INITIAL  4.  Pt will demo [facility] problem solving skills in in simple cognitive linguistic tasks in 2 sessions Baseline:  Goal status: INITIAL   LONG TERM GOALS: Target date: 02/02/24  Pt will improve PROM compared to initial administration Baseline:  Goal status: INITIAL  2.  Pt will participate in 10 minutes mod complex conversation with functional expressive language (using anomia compensations) in 3 sessions Baseline:  Goal status: INITIAL  3.  Pt will demo functional problem solving skills in practical  cognitive linguistic tasks in 3 sessions Baseline:  Goal status: INITIAL   ASSESSMENT:  CLINICAL IMPRESSION: Patient is a 73 y.o. F who was seen today for treatment of aphasia in light of bilateral frontal CVA, and parietal CVA. Given her premorbid dx of ADHD and MCI and OT focus on other areas identified on CLQT, SLP will focus on problem solving in functional cognitive linguistic tasks. She also demonstrated s/sx of cognitive linguistic impairment requiring a full evaluation in the first 2 sessions. Pt with premorbid dx of ADHD and MCI.  OBJECTIVE IMPAIRMENTS: include attention, memory, awareness, and aphasia. These impairments are limiting patient from managing medications, managing appointments, managing finances, household responsibilities, ADLs/IADLs, and effectively communicating at home and in community. Factors affecting potential to achieve goals and functional outcome are ability to learn/carryover information and previous level of function. Patient will benefit from skilled SLP services to address above impairments and improve overall function.  REHAB POTENTIAL: Good  PLAN:  SLP FREQUENCY: 2x/week  SLP DURATION: 8 weeks  PLANNED INTERVENTIONS: Language facilitation, Environmental controls, Cueing hierachy, Cognitive reorganization, Internal/external aids, Functional tasks, Multimodal communication approach, SLP instruction and feedback, Compensatory strategies, Patient/family education, [phone] Treatment of speech (30 or 45 min) , and [id number] (Standardized cognitive assessment)    Sandrina Heaton, CCC-SLP 12/25/2023, 1:53 PM

## 2023-12-26 ENCOUNTER — Encounter: Payer: PPO | Admitting: Psychology

## 2023-12-27 NOTE — Progress Notes (Signed)
 [facility] Summary Report / Loop Recorder

## 2023-12-28 ENCOUNTER — Ambulatory Visit: Admitting: Occupational Therapy

## 2023-12-28 ENCOUNTER — Ambulatory Visit

## 2023-12-28 DIAGNOSIS — R41841 Cognitive communication deficit: Secondary | ICD-10-CM

## 2023-12-28 DIAGNOSIS — I6340 Cerebral infarction due to embolism of unspecified cerebral artery: Secondary | ICD-10-CM

## 2023-12-28 DIAGNOSIS — M6281 Muscle weakness (generalized): Secondary | ICD-10-CM

## 2023-12-28 DIAGNOSIS — R41840 Attention and concentration deficit: Secondary | ICD-10-CM | POA: Diagnosis not present

## 2023-12-28 DIAGNOSIS — R4701 Aphasia: Secondary | ICD-10-CM

## 2023-12-28 NOTE — Therapy (Signed)
 OUTPATIENT OCCUPATIONAL THERAPY NEURO Treatment  [name] MRN: [id number] DOB:1950-09-26, 73 y.o., female Today's Date: 12/28/2023  PCP: [name], NP REFERRING PROVIDER: [name], MD  END OF SESSION:  OT End of Session - 12/28/23 1147     Visit Number 6    Number of Visits 11    Date for OT Re-Evaluation 01/05/24    Authorization Type [employer]    OT Start Time 1100    OT Stop Time 1144    OT Time Calculation (min) 44 min    Activity Tolerance Patient tolerated treatment well    Behavior During Therapy WFL for tasks assessed/performed               Past Medical History:  Diagnosis Date   Acute combined systolic and diastolic heart failure 10/11/2023   ADHD (attention deficit hyperactivity disorder) 05/09/2007   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; multiple punctate foci of restricted diffusion in bilateral frontal lobes and the left parietal lobe   Diverticulitis of colon (without mention of hemorrhage) 09/17/2013   Essential hypertension 10/09/2017   HFrEF (heart failure with reduced ejection fraction) 10/10/2023   History of COVID-19 2021   Hyperlipidemia    Hypothyroidism    Low back pain radiating to right leg 10/09/2017   Mild cognitive impairment of uncertain or unknown etiology 08/04/2022   Mild intermittent asthma with acute exacerbation 05/30/2017   Mild neurocognitive disorder due to multiple etiologies 12/19/2023   NSVT (nonsustained ventricular tachycardia) 10/12/2023   Osteoarthritis    Postmenopausal atrophic vaginitis, severe 08/18/2008   Right knee pain 03/13/2014   S/P arthroscopy of right knee 06/03/2014   Vitamin B12 deficiency    Past Surgical History:  Procedure Laterality Date   ABDOMINAL HYSTERECTOMY     BREAST SURGERY     CARPAL TUNNEL RELEASE Bilateral    CERVICAL DISC ARTHROPLASTY     CESAREAN SECTION     x2   left shoulder surgery     LOOP RECORDER  INSERTION N/A 10/12/2023   Procedure: LOOP RECORDER INSERTION;  Surgeon: Boyce Byes, MD;  Location: [facility];  Service: Cardiovascular;  Laterality: N/A;   MENISCUS REPAIR Right 2017   REDUCTION MAMMAPLASTY Bilateral    right shoulder     TONSILLECTOMY     Patient Active Problem List   Diagnosis Date Noted   Mild neurocognitive disorder due to multiple etiologies 12/19/2023   NSVT (nonsustained ventricular tachycardia) 10/12/2023   Acute combined systolic and diastolic heart failure 10/11/2023   HFrEF (heart failure with reduced ejection fraction) 10/10/2023   CVA (cerebral vascular accident) 10/08/2023   Mild cognitive impairment of uncertain or unknown etiology 08/04/2022   Vitamin B12 deficiency    Osteoarthritis 08/03/2022   Hyperlipidemia 08/03/2022   Essential hypertension 10/09/2017   Low back pain radiating to right leg 10/09/2017   Mild intermittent asthma with acute exacerbation 05/30/2017   S/P arthroscopy of right knee 06/03/2014   Right knee pain 03/13/2014   Diverticulitis of colon (without mention of hemorrhage) 09/17/2013   Postmenopausal atrophic vaginitis, severe 08/18/2008   Hypothyroidism 05/09/2007   ADHD (attention deficit hyperactivity disorder) 05/09/2007    ONSET DATE: 10/08/23  REFERRING DIAG: I63.9 (ICD-10-CM) - Cerebral infarction, unspecified  THERAPY DIAG:  Attention and concentration deficit  Muscle weakness (generalized)  Cerebrovascular accident (CVA) due to embolism of cerebral artery (HCC)  Rationale for Evaluation and Treatment: Rehabilitation  SUBJECTIVE:   SUBJECTIVE STATEMENT: Pt  reports things are going well, she is taking daily walks with spouse.   Pt accompanied by: self and spouse  PERTINENT HISTORY: ADHD, HTN, diverticulitis, HLD, hypothyroidism, low back pain, neck fusion, mild cognitive impairment, OA in R knee, asthma   presented to [facility] ED on 10/08/2023 with sudden onset of left facial droop, left-sided  weakness,and L visual cut. MRI: Multiple punctate acute infarcts bilateral frontal and left parietal lobes. EEG suggestive of moderate diffuse encephalopathy.   PRECAUTIONS: Fall  WEIGHT BEARING RESTRICTIONS: No  PAIN:  Are you having pain? No pain  FALLS: Has patient fallen in last 6 months? No  LIVING ENVIRONMENT: Lives with: lives with their spouse Lives in: House/apartment Stairs: Yes: Internal: bedroom/bathroom on main floor, does have a full flight of steps - spouse's office is upstairs  steps; one rail and External: 2 steps;   Has following equipment at home: shower chair and hand held shower head  PLOF: Independent, Independent with basic ADLs, and Leisure: walking up to 4-6 hours per day  PATIENT GOALS: I really don't know  OBJECTIVE:  Note: Objective measures were completed at Evaluation unless otherwise noted.  HAND DOMINANCE: Right  ADLs: Overall ADLs: Mod I with bathing and dressing, pt's spouse will go in and out of bathroom while pt in shower providing distant supervision to Mod I level Transfers/ambulation related to ADLs: Mod I without AD, getting into high bed without any issue  IADLs: Shopping: pt gets overwhelmed with everything that is going on, easily distracted  Meal Prep: spouse is doing most of the cooking right now as pt forgetful with sequencing and ingredients, and easily distracted Community mobility: not cleared to drive at this time Medication management: spouse is assisting now, he will fill up a daily pill box for pt   MOBILITY STATUS: Mod I without AD  POSTURE COMMENTS:  No Significant postural limitations  ACTIVITY TOLERANCE: Activity tolerance: pt reports worn out after 30 min walk (where as they would walk up to 2 hrs at a time before stroke).  Pt is not sleeping as much during the day time.  FUNCTIONAL OUTCOME MEASURES: 9 hole peg test: Right: 36.54 sec; Left: 34.06 sec Trail Making: 42.47 sec for Trail A   UPPER EXTREMITY ROM:   WFL bilaterally  UPPER EXTREMITY MMT:     MMT Right eval Left eval  Shoulder flexion 4+ 4-  Shoulder abduction    Shoulder adduction    Shoulder extension    Shoulder internal rotation    Shoulder external rotation    Middle trapezius    Lower trapezius    Elbow flexion 4 4  Elbow extension 4- 4-  Wrist flexion    Wrist extension    Wrist ulnar deviation    Wrist radial deviation    Wrist pronation    Wrist supination    (Blank rows = not tested)  COORDINATION: 9 Hole Peg test: Right: 36.54 sec; Left: 34.06 sec Box and Blocks:  Right 43 blocks, Left 36blocks - Pt requiring cues x3 to remember to only pick up one block at a time when completing with Right; requiring cues to go over barrier instead of around (possibly slowing down pace even more).  SENSATION: WFL  COGNITION: Overall cognitive status: Impaired and History of cognitive impairments - at baseline Pt able to recall 3 words: sock blue bed after 5 mins  Pt with difficulty with word finding and memory  VISION: Subjective report: wears glasses, reported f/u with neurology and ophthalmologist who  report to wait for further assessment to allow recovery s/p CVA Baseline vision: Wears glasses all the time  VISION ASSESSMENT: To be further assessed in functional context Eye alignment: WFL Ocular ROM: WFL Gaze preference/alignment: WDL Tracking/Visual pursuits: TBA Visual Fields: TBA in functional context   Bell cancellation test: locating 32/35 in 3:00, pt then able to locate remaining 2/3 in 4:20 before stating I think that is all  PERCEPTION: Impaired  PRAXIS: Impaired: Initiation, Motor planning, and Organization  OBSERVATIONS: Difficult to assess vision, attention, and strength as pt requiring increased cueing and repeat directions.  Unsure full attention and effort during MMT this session.  Pt forgetting instructions during coordination assessment, requiring wait time for recall and/or additional  cues.                                                                                                                             TREATMENT DATE:  12/28/23 Engaged pt in functional mobility throughout busy and crowded space while maneuvering obstacles, attending to the L side, all while visually scanning and locating an item of specified color, able to perform with min verbal cues Engaged pt in dual task while walking to simulate walks with spouse and in community while engaging in conversation for sequencing and problem solving animal list going A-Z, note pt needed verbal cuing to attend to task, remember where leaving off, and at times unable to name next animal despite max cues, spelling name of animal, description, etc. Relayed to spouse to continue performing dual tasks at home and on walks.  Phone Use: engaged in using cell phone for familiar object with simple cues like call/text [name] and pt needing extended time and min verbal cues to locate icons and perform task.   12/20/23 Logic puzzles: 3x3 puzzle to improve sequencing, deductive reasoning, memory and recall of unfamiliar activities. Pt completed task with mod v/c for deductive reasoning components of task.  OT provided pt and spouse with sample logic questions (without grid) to challenge sequencing and deductive reasoning. Tangram: engaged in tangram puzzle to focus on vision, sequencing, attention, and coordination.  Pt demonstrating occasional difficulty with picking up small pieces with R hand.  OT then incorporating conversation while pt completing to challenge alternating attention.  Pt demonstrating good attention to task, with mild pauses to engage in conversation but able to return to task. Community reintegration: engaged in education on slowly returning to community reintegration as pt expressing desire to return to church. Spouse expressing that an MD had warned against being in places with too many people who may be unknowingly  sick and adding a recovery from illness to detract from focus on recovery from stroke. OT encouraged pt to f/u with MD in regards to when she can increase community participation from a health standpoint.  OT educated on going out during off peak times, getting to church or parties early and sitting down before the rush and letting people come to her  vs walking through/around crowds.     PATIENT EDUCATION: Education details: see today's tx above Person educated: Patient and Spouse Education method: Explanation and Handouts Education comprehension: verbalized understanding and needs further education  HOME EXERCISE PROGRAM: Provided written suggestions for keeping thinking skills sharp - plan to provide printout at future session 12/13/23 - memory compensation strategies and activities to keep thinking skills sharp (see pt instructions)   GOALS: Goals reviewed with patient? Yes  SHORT TERM GOALS: Target date: 12/15/23  Pt will be independent with coordination HEP with visual handouts for recall. Baseline: new to OPOT Goal status: in progress  2.  Pt will demonstrate understanding of memory compensations and ways to keep thinking skills sharp Baseline: STM deficits and impaired initiation Goal status: in progress  3.  Pt will demonstrate and/or verbalize understanding of visual attention exercises and compensatory strategies for improved visual scanning and attention. Baseline: left field cut Goal status: in progress  4.  Pt will demonstrate improved coordination, attention to task, and recall of instructions by completing box and blocks assessment with improved score by 6 blocks bilaterally. Baseline: Right 43 blocks, Left 36blocks - Pt requiring cues x3 to remember to only pick up one block at a time when completing with Right; requiring cues to go over barrier instead of around (possibly slowing down pace even more). Goal status: in progress   LONG TERM GOALS: Target date:  01/05/24  Pt will verbalize understanding of task modifications, adaptive strategies, and/or potential AE needs to increase ease, safety, and independence w/ IADLs. Baseline: decreased engagement in IADLs due to impaired memory and attention Goal status: in progress  2.  Pt will complete simulated medication management activity w/ Supervision by discharge, incorporating compensatory strategies/AE prn Baseline: spouse managing meds at this time, was not prior to CVA Goal status: in progress  3.  Pt will demonstrate ability to sequence simple functional task (simple snack prep, laundry task, etc) at Mod I level with good safety awareness and ability to utilize memory strategies for recall. Baseline:  Goal status: in progress  4.  Pt will navigate a moderately busy environment, completing dual task activity and/or following multi-step commands with 90% accuracy Baseline:  Goal status: in progress  5.  Pt will demonstrate improved initiation and sequencing to be able to navigate use of personal cell phone and send text messages in <15 mins. Baseline: spouse reporting 45 mins to reply to test message Goal status: in progress   ASSESSMENT:  CLINICAL IMPRESSION: Pt tolerated tasks well this session.  Pt demonstrated decreased attention to the L visual field but able to compensate, only running into objects 1x during session. Pt is an active participant in cognitive retraining activities, dual tasks, and sequencing - note pt is very pleasant and is aware that she does have cognitive and memory deficits. Pt and spouse receptive to education for techniques to still do light IADL at home such as reading, using phone, etc.    PERFORMANCE DEFICITS: in functional skills including ADLs, IADLs, coordination, strength, Fine motor control, Gross motor control, endurance, decreased knowledge of precautions, vision, and UE functional use, cognitive skills including attention, energy/drive, memory, problem  solving, safety awareness, and sequencing, and psychosocial skills including environmental adaptation and routines and behaviors.     PLAN:  OT FREQUENCY: 1-2x/week  OT DURATION: 6 weeks  PLANNED INTERVENTIONS: 97168 OT Re-evaluation, 97535 self care/ADL training, 21308 therapeutic exercise, 97530 therapeutic activity, 97112 neuromuscular re-education, functional mobility training, visual/perceptual remediation/compensation, psychosocial skills training,  energy conservation, coping strategies training, patient/family education, and DME and/or AE instructions  RECOMMENDED OTHER SERVICES: SLP  CONSULTED AND AGREED WITH PLAN OF CARE: Patient and family member/caregiver  PLAN FOR NEXT SESSION:   Cognitive/motor dual tasking, sequencing, recall of 2 step directions  Phone use  Further assess during functional tasks, review PRN visual compensatory strategies (especially during functional tasks)  Visual scanning tasks  Sequencing tasks    Doroteo Gasmen, OTR/L 12/28/2023, 11:49 AM  [facility] at [facility] [address], [address] [place], [place] [address] Phone # [phone] Fax # [phone]

## 2023-12-28 NOTE — Therapy (Signed)
 OUTPATIENT SPEECH LANGUAGE PATHOLOGY TREATMENT   [name] MRN: [id number] DOB:[birth year], 73 y.o., female Today's Date: 12/28/2023  PCP: [name], NP REFERRING PROVIDER: [name], MD  END OF SESSION:  End of Session - 12/28/23 0856     Visit Number 5    Number of Visits 17    Date for SLP Re-Evaluation 02/02/24    SLP Start Time 0848    SLP Stop Time  0930    SLP Time Calculation (min) 42 min    Activity Tolerance Patient tolerated treatment well             Past Medical History:  Diagnosis Date   Acute combined systolic and diastolic heart failure 10/11/2023   ADHD (attention deficit hyperactivity disorder) 05/09/2007   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; multiple punctate foci of restricted diffusion in bilateral frontal lobes and the left parietal lobe   Diverticulitis of colon (without mention of hemorrhage) 09/17/2013   Essential hypertension 10/09/2017   HFrEF (heart failure with reduced ejection fraction) 10/10/2023   History of COVID-19 2021   Hyperlipidemia    Hypothyroidism    Low back pain radiating to right leg 10/09/2017   Mild cognitive impairment of uncertain or unknown etiology 08/04/2022   Mild intermittent asthma with acute exacerbation 05/30/2017   Mild neurocognitive disorder due to multiple etiologies 12/19/2023   NSVT (nonsustained ventricular tachycardia) 10/12/2023   Osteoarthritis    Postmenopausal atrophic vaginitis, severe 08/18/2008   Right knee pain 03/13/2014   S/P arthroscopy of right knee 06/03/2014   Vitamin B12 deficiency    Past Surgical History:  Procedure Laterality Date   ABDOMINAL HYSTERECTOMY     BREAST SURGERY     CARPAL TUNNEL RELEASE Bilateral    CERVICAL DISC ARTHROPLASTY     CESAREAN SECTION     x2   left shoulder surgery     LOOP RECORDER INSERTION N/A 10/12/2023   Procedure: LOOP RECORDER INSERTION;  Surgeon: Boyce Byes, MD;  Location:  [facility];  Service: Cardiovascular;  Laterality: N/A;   MENISCUS REPAIR Right 2017   REDUCTION MAMMAPLASTY Bilateral    right shoulder     TONSILLECTOMY     Patient Active Problem List   Diagnosis Date Noted   Mild neurocognitive disorder due to multiple etiologies 12/19/2023   NSVT (nonsustained ventricular tachycardia) 10/12/2023   Acute combined systolic and diastolic heart failure 10/11/2023   HFrEF (heart failure with reduced ejection fraction) 10/10/2023   CVA (cerebral vascular accident) 10/08/2023   Mild cognitive impairment of uncertain or unknown etiology 08/04/2022   Vitamin B12 deficiency    Osteoarthritis 08/03/2022   Hyperlipidemia 08/03/2022   Essential hypertension 10/09/2017   Low back pain radiating to right leg 10/09/2017   Mild intermittent asthma with acute exacerbation 05/30/2017   S/P arthroscopy of right knee 06/03/2014   Right knee pain 03/13/2014   Diverticulitis of colon (without mention of hemorrhage) 09/17/2013   Postmenopausal atrophic vaginitis, severe 08/18/2008   Hypothyroidism 05/09/2007   ADHD (attention deficit hyperactivity disorder) 05/09/2007    ONSET DATE:  10/08/23  REFERRING DIAG: Q46.962 (ICD-10-CM) - Aphasia as late effect of stroke  THERAPY DIAG:  No diagnosis found.  Rationale for Evaluation and Treatment: Rehabilitation  SUBJECTIVE:   SUBJECTIVE STATEMENT: OK (pt looks away grimacing, re SFA completion)  Pt accompanied by: self   PERTINENT HISTORY: HPI: Pt presented to [facility] ED on 10/08/2023 with sudden onset of left facial  droop and left-sided weakness. ADHD, HTN, diverticulitis, HLD, hypothyroidism, low back pain, neck fusion, mild cognitive impairment (dx Jan 2024), cervical disc arthroplasty, OA in R knee, asthma   PAIN:  Are you having pain? No  FALLS: Has patient fallen in last 6 months?  No  PATIENT GOALS: Improve language function, memory  OBJECTIVE:  Note: Objective measures were completed at  Evaluation unless otherwise noted.  DIAGNOSTIC FINDINGS:  MRI 10/09/23 IMPRESSION: 1. Findings compatible with multiple punctate acute infarcts in bilateral frontal and left parietal lobes. Given involvement of multiple vascular territories, consider an embolic etiology. 2. Significantly motion limited study.   Date of Discharge from SLP service:October 19, 2023 Clinical Impression/Discharge Summary: Pt has made great gains and has met 4 of 6 LTG's this admission due to improved cognition and language. Pt is currently an overall mod I A for orientation tasks, though requires up to [facility] for attention. Patient requires mod I A for expressive language, though continues to require occasional min-modA for complex receptive language tasks.  Pt/family education complete and pt will discharge home with 24 hour supervision from friends/family/etc. Pt would benefit from OP f/u ST services to maximize cognition and communication in order to maximize functional independence.     STANDARDIZED ASSESSMENTS:   Cognitive Linguistic Quick Test  AGE - 70-89  The Cognitive Linguistic Quick Test (CLQT) was administered to assess the relative status of five cognitive domains: attention, memory, language, executive functioning, and visuospatial skills. Scores from 10 tasks were used to estimate severity ratings (standardized for age groups 18-69 years and 70-89 years) for each domain, a clock drawing task, as well as an overall composite severity rating of cognition.     Task Score Criterion Cut Scores  Personal Facts 8/8 8  Symbol Cancellation 12/12 10  Confrontation Naming 10/10 10  Clock Drawing  12/13 11  Story Retelling 8/10 5  Symbol Trails 10/10 6  Generative Naming 3/9 4  Design Memory 4/6 4  Mazes  4/8 4  Design Generation 3/13 5   Cognitive Domain Composite Score Severity Rating  Attention 181/215 WNL  Memory 147/185 WNL  Executive Function 20/40 Low-WNL  Language 29/37 Low-WNL  Visuospatial  Skills 75/105 WNL  Clock Drawing  12/13 WNL  Composite Severity Rating  WNL     PATIENT REPORTED OUTCOME MEASURES (PROM): Communication Effectiveness Survey: 12/20/23 - pt scored herself 18/32, with higher scores indicating less of a negative effect of pt's deficits on daily life and QOL.                                                                                                                              TREATMENT DATE:  Semantic Feature Analysis (SFA)  12/28/23: Pt brought in books that daughter bought her. SLP to look at these and star pages SLP thinks would be helpful for pt. SLP worked with pt today with SFA - grill. She req'd mod A occasionally for procedure and  for more accurate wording, especially description. SLP provided homework for description.  12/25/23:  Minimal difficulty with letter fill in homework. Focusing on problem solving in  cognitive linguistics today, SLP assisted pt with scrambled sentences.Four to 5-word sentences were completed with 4/4 success. When pt worked on 7-9 word sentences with SLP she had intermittent difficulty with mental flexibility with word choice. SLP provided mod cues faded to min nonverbal cues. Pt's attention/memory deficit made alternating attention difficult at times so SLP suggested pt write the word order above the words and this helped pt compensate for decr'd alternating attention.  SLP provided compensations for anomia for pt and educated her and [name] about these.   12/20/23: PROM provided today, with score above. [name] cueing pt about the accuracy of her answers. Needs anomia compensations next session. SLP asked [name] to fill out a CES as well for SLP to compare. Pt completed 3 clues task with 90% success, however only named average 2 items in a common category for divergent naming prior to requiring consistent mod-max  A. SLP provided letter fill ins for homework, and divergent naming for flowers in her yard, and for sports.  12/14/23:  PT NEEDS PROM NEXT SESSION. SLP completed CLQT today. From pt's CLQT score it would indicate that although pt's Composite Score is WNL she had difficulty with processing speed (mazes, design generation), problem solving Forensic psychologist), memory Training and development officer memory), and language (generative naming). Goals added.   12/07/23: SLP educated pt and husband [name]) with describing phonemic cues, as this appeared to help pt during BNT. SLP provided examples during BNT and told [name] to assist pt in this way if he is sure of the word pt means to say. Secondly, SLP educated pt and husband on how to complete semantic feature analysis (SFA) at home with words/nouns pt experiences anomia with. SLP explained benefits of SFA to pt and husband. [name] took notes during session. SLP provided handouts for this framework.  PATIENT EDUCATION: Education details: see treatment date Person educated: Patient and Spouse Education method: Explanation, Demonstration, and Handouts Education comprehension: verbalized understanding and needs further education   GOALS: Goals reviewed with patient? Yes in general  SHORT TERM GOALS: Target date: 01/05/24  Pt will complete cognitive linguistic assessment in first 2 sessions  Baseline: Goal status: met  2.  Pt will successfully use anomia compensations with min A to do so, for 10 minutes functional simple conversation in 2 sessions Baseline:  Goal status: INITIAL  3.  Pt will demo how to use SFA with rare min A in 3 sessions Baseline:  Goal status: INITIAL  4.  Pt will demo [facility] problem solving skills in simple cognitive linguistic tasks in 2 sessions Baseline:  Goal status: INITIAL   LONG TERM GOALS: Target date: 02/02/24  Pt will improve PROM compared to initial administration Baseline:  Goal status: INITIAL  2.  Pt will participate in 10 minutes mod complex conversation with functional expressive language (using anomia compensations) in 3 sessions Baseline:   Goal status: INITIAL  3.  Pt will demo functional problem solving skills in practical cognitive linguistic tasks in 3 sessions Baseline:  Goal status: INITIAL   ASSESSMENT:  CLINICAL IMPRESSION: Patient is a 73 y.o. F who was seen today for treatment of aphasia in light of bilateral frontal CVA, and parietal CVA. See treatment date above for today's date for further details on today's session. Given her premorbid dx of ADHD and MCI and OT focus on other areas identified on  CLQT, SLP will focus on problem solving in functional cognitive linguistic tasks. She also demonstrated s/sx of cognitive linguistic impairment requiring a full evaluation in the first 2 sessions. Pt with premorbid dx of ADHD and MCI.   OBJECTIVE IMPAIRMENTS: include attention, memory, awareness, and aphasia. These impairments are limiting patient from managing medications, managing appointments, managing finances, household responsibilities, ADLs/IADLs, and effectively communicating at home and in community. Factors affecting potential to achieve goals and functional outcome are ability to learn/carryover information and previous level of function. Patient will benefit from skilled SLP services to address above impairments and improve overall function.  REHAB POTENTIAL: Good  PLAN:  SLP FREQUENCY: 2x/week  SLP DURATION: 8 weeks  PLANNED INTERVENTIONS: Language facilitation, Environmental controls, Cueing hierachy, Cognitive reorganization, Internal/external aids, Functional tasks, Multimodal communication approach, SLP instruction and feedback, Compensatory strategies, Patient/family education, [phone] Treatment of speech (30 or 45 min) , and [phone] (Standardized cognitive assessment)    Lovey Crupi, CCC-SLP 12/28/2023, 1:01 PM

## 2023-12-29 ENCOUNTER — Ambulatory Visit

## 2024-01-01 ENCOUNTER — Ambulatory Visit

## 2024-01-01 DIAGNOSIS — R41840 Attention and concentration deficit: Secondary | ICD-10-CM | POA: Diagnosis not present

## 2024-01-01 DIAGNOSIS — R4701 Aphasia: Secondary | ICD-10-CM

## 2024-01-01 DIAGNOSIS — R41841 Cognitive communication deficit: Secondary | ICD-10-CM

## 2024-01-01 NOTE — Therapy (Signed)
 OUTPATIENT SPEECH LANGUAGE PATHOLOGY TREATMENT   [name] MRN: [id number] DOB:[birth year], 73 y.o., female Today's Date: 01/01/2024  PCP: [name], NP REFERRING PROVIDER: [name], MD  END OF SESSION:  End of Session - 01/01/24 1418     Visit Number 6    Number of Visits 17    Date for SLP Re-Evaluation 02/02/24    SLP Start Time 1405    SLP Stop Time  1445    SLP Time Calculation (min) 40 min    Activity Tolerance Patient tolerated treatment well             Past Medical History:  Diagnosis Date   Acute combined systolic and diastolic heart failure 10/11/2023   ADHD (attention deficit hyperactivity disorder) 05/09/2007   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; multiple punctate foci of restricted diffusion in bilateral frontal lobes and the left parietal lobe   Diverticulitis of colon (without mention of hemorrhage) 09/17/2013   Essential hypertension 10/09/2017   HFrEF (heart failure with reduced ejection fraction) 10/10/2023   History of COVID-19 2021   Hyperlipidemia    Hypothyroidism    Low back pain radiating to right leg 10/09/2017   Mild cognitive impairment of uncertain or unknown etiology 08/04/2022   Mild intermittent asthma with acute exacerbation 05/30/2017   Mild neurocognitive disorder due to multiple etiologies 12/19/2023   NSVT (nonsustained ventricular tachycardia) 10/12/2023   Osteoarthritis    Postmenopausal atrophic vaginitis, severe 08/18/2008   Right knee pain 03/13/2014   S/P arthroscopy of right knee 06/03/2014   Vitamin B12 deficiency    Past Surgical History:  Procedure Laterality Date   ABDOMINAL HYSTERECTOMY     BREAST SURGERY     CARPAL TUNNEL RELEASE Bilateral    CERVICAL DISC ARTHROPLASTY     CESAREAN SECTION     x2   left shoulder surgery     LOOP RECORDER INSERTION N/A 10/12/2023   Procedure: LOOP RECORDER INSERTION;  Surgeon: Boyce Byes, MD;  Location:  [facility];  Service: Cardiovascular;  Laterality: N/A;   MENISCUS REPAIR Right 2017   REDUCTION MAMMAPLASTY Bilateral    right shoulder     TONSILLECTOMY     Patient Active Problem List   Diagnosis Date Noted   Mild neurocognitive disorder due to multiple etiologies 12/19/2023   NSVT (nonsustained ventricular tachycardia) 10/12/2023   Acute combined systolic and diastolic heart failure 10/11/2023   HFrEF (heart failure with reduced ejection fraction) 10/10/2023   CVA (cerebral vascular accident) 10/08/2023   Mild cognitive impairment of uncertain or unknown etiology 08/04/2022   Vitamin B12 deficiency    Osteoarthritis 08/03/2022   Hyperlipidemia 08/03/2022   Essential hypertension 10/09/2017   Low back pain radiating to right leg 10/09/2017   Mild intermittent asthma with acute exacerbation 05/30/2017   S/P arthroscopy of right knee 06/03/2014   Right knee pain 03/13/2014   Diverticulitis of colon (without mention of hemorrhage) 09/17/2013   Postmenopausal atrophic vaginitis, severe 08/18/2008   Hypothyroidism 05/09/2007   ADHD (attention deficit hyperactivity disorder) 05/09/2007    ONSET DATE:  10/08/23  REFERRING DIAG: N82.956 (ICD-10-CM) - Aphasia as late effect of stroke  THERAPY DIAG:  Aphasia  Cognitive communication deficit  Rationale for Evaluation and Treatment: Rehabilitation  SUBJECTIVE:   SUBJECTIVE STATEMENT: Pt returns with homework completed.   Pt accompanied by: self   PERTINENT HISTORY: HPI: Pt presented to [facility] ED on 10/08/2023 with sudden onset of left facial  droop and left-sided weakness. ADHD, HTN, diverticulitis, HLD, hypothyroidism, low back pain, neck fusion, mild cognitive impairment (dx Jan 2024), cervical disc arthroplasty, OA in R knee, asthma   PAIN:  Are you having pain? No  FALLS: Has patient fallen in last 6 months?  No  PATIENT GOALS: Improve language function, memory  OBJECTIVE:  Note: Objective measures were completed  at Evaluation unless otherwise noted.  DIAGNOSTIC FINDINGS:  MRI 10/09/23 IMPRESSION: 1. Findings compatible with multiple punctate acute infarcts in bilateral frontal and left parietal lobes. Given involvement of multiple vascular territories, consider an embolic etiology. 2. Significantly motion limited study.   Date of Discharge from SLP service:October 19, 2023 Clinical Impression/Discharge Summary: Pt has made great gains and has met 4 of 6 LTG's this admission due to improved cognition and language. Pt is currently an overall mod I A for orientation tasks, though requires up to [facility] for attention. Patient requires mod I A for expressive language, though continues to require occasional min-modA for complex receptive language tasks.  Pt/family education complete and pt will discharge home with 24 hour supervision from friends/family/etc. Pt would benefit from OP f/u ST services to maximize cognition and communication in order to maximize functional independence.     STANDARDIZED ASSESSMENTS:   Cognitive Linguistic Quick Test  AGE - 70-89  The Cognitive Linguistic Quick Test (CLQT) was administered to assess the relative status of five cognitive domains: attention, memory, language, executive functioning, and visuospatial skills. Scores from 10 tasks were used to estimate severity ratings (standardized for age groups 18-69 years and 70-89 years) for each domain, a clock drawing task, as well as an overall composite severity rating of cognition.     Task Score Criterion Cut Scores  Personal Facts 8/8 8  Symbol Cancellation 12/12 10  Confrontation Naming 10/10 10  Clock Drawing  12/13 11  Story Retelling 8/10 5  Symbol Trails 10/10 6  Generative Naming 3/9 4  Design Memory 4/6 4  Mazes  4/8 4  Design Generation 3/13 5   Cognitive Domain Composite Score Severity Rating  Attention 181/215 WNL  Memory 147/185 WNL  Executive Function 20/40 Low-WNL  Language 29/37 Low-WNL  Visuospatial  Skills 75/105 WNL  Clock Drawing  12/13 WNL  Composite Severity Rating  WNL     PATIENT REPORTED OUTCOME MEASURES (PROM): Communication Effectiveness Survey: 12/20/23 - pt scored herself 18/32, with higher scores indicating less of a negative effect of pt's deficits on daily life and QOL.                                                                                                                              TREATMENT DATE:  Semantic Feature Analysis (SFA)  01/01/24: Pt did not complete any SFA since last session. SLP told pt to complete when she has anomia with a noun. Pt brought homework with her completed. She finished 40 wrtiten definitions in approx 90 minutes. Today SLP targeted simple  problem solving with 5-step sequencing task. Pt took almost 10 minutes to complete 4 sequences. She was provided with homework until next session.  12/28/23: Pt brought in books that daughter bought her. SLP to look at these and star pages SLP thinks would be helpful for pt. SLP worked with pt today with SFA - grill. She req'd mod A occasionally for procedure and for more accurate wording, especially description. SLP provided homework for description.  12/25/23:  Minimal difficulty with letter fill in homework. Focusing on problem solving in  cognitive linguistics today, SLP assisted pt with scrambled sentences.Four to 5-word sentences were completed with 4/4 success. When pt worked on 7-9 word sentences with SLP she had intermittent difficulty with mental flexibility with word choice. SLP provided mod cues faded to min nonverbal cues. Pt's attention/memory deficit made alternating attention difficult at times so SLP suggested pt write the word order above the words and this helped pt compensate for decr'd alternating attention.  SLP provided compensations for anomia for pt and educated her and [name] about these.   12/20/23: PROM provided today, with score above. [name] cueing pt about the accuracy of her  answers. Needs anomia compensations next session. SLP asked [name] to fill out a CES as well for SLP to compare. Pt completed 3 clues task with 90% success, however only named average 2 items in a common category for divergent naming prior to requiring consistent mod-max  A. SLP provided letter fill ins for homework, and divergent naming for flowers in her yard, and for sports.  12/14/23: PT NEEDS PROM NEXT SESSION. SLP completed CLQT today. From pt's CLQT score it would indicate that although pt's Composite Score is WNL she had difficulty with processing speed (mazes, design generation), problem solving Forensic psychologist), memory Training and development officer memory), and language (generative naming). Goals added.   12/07/23: SLP educated pt and husband [name]) with describing phonemic cues, as this appeared to help pt during BNT. SLP provided examples during BNT and told [name] to assist pt in this way if he is sure of the word pt means to say. Secondly, SLP educated pt and husband on how to complete semantic feature analysis (SFA) at home with words/nouns pt experiences anomia with. SLP explained benefits of SFA to pt and husband. [name] took notes during session. SLP provided handouts for this framework.  PATIENT EDUCATION: Education details: see treatment date Person educated: Patient and Spouse Education method: Explanation, Demonstration, and Handouts Education comprehension: verbalized understanding and needs further education   GOALS: Goals reviewed with patient? Yes in general  SHORT TERM GOALS: Target date: 01/05/24  Pt will complete cognitive linguistic assessment in first 2 sessions  Baseline: Goal status: met  2.  Pt will successfully use anomia compensations with min A to do so, for 10 minutes functional simple conversation in 2 sessions Baseline:  Goal status: INITIAL  3.  Pt will demo how to use SFA with rare min A in 3 sessions Baseline:  Goal status: INITIAL  4.  Pt will demo [facility]  problem solving skills in simple cognitive linguistic tasks in 2 sessions Baseline:  Goal status: INITIAL   LONG TERM GOALS: Target date: 02/02/24  Pt will improve PROM compared to initial administration Baseline:  Goal status: INITIAL  2.  Pt will participate in 10 minutes mod complex conversation with functional expressive language (using anomia compensations) in 3 sessions Baseline:  Goal status: INITIAL  3.  Pt will demo functional problem solving skills in practical cognitive linguistic tasks in  3 sessions Baseline:  Goal status: INITIAL   ASSESSMENT:  CLINICAL IMPRESSION: Patient is a 73 y.o. F who was seen today for treatment of aphasia and problem solving in light of bilateral frontal CVA, and parietal CVA. See treatment date above for today's date for further details on today's session. Given her premorbid dx of ADHD and MCI and OT focus on other areas identified on CLQT, SLP will focus on problem solving in functional cognitive linguistic tasks. She also demonstrated s/sx of cognitive linguistic impairment requiring a full evaluation in the first 2 sessions. Pt with premorbid dx of ADHD and MCI.   OBJECTIVE IMPAIRMENTS: include attention, memory, awareness, and aphasia. These impairments are limiting patient from managing medications, managing appointments, managing finances, household responsibilities, ADLs/IADLs, and effectively communicating at home and in community. Factors affecting potential to achieve goals and functional outcome are ability to learn/carryover information and previous level of function. Patient will benefit from skilled SLP services to address above impairments and improve overall function.  REHAB POTENTIAL: Good  PLAN:  SLP FREQUENCY: 2x/week  SLP DURATION: 8 weeks  PLANNED INTERVENTIONS: Language facilitation, Environmental controls, Cueing hierachy, Cognitive reorganization, Internal/external aids, Functional tasks, Multimodal communication  approach, SLP instruction and feedback, Compensatory strategies, Patient/family education, [phone] Treatment of speech (30 or 45 min) , and 60454 (Standardized cognitive assessment)    Nakiesha Rumsey, CCC-SLP 01/01/2024, 2:19 PM

## 2024-01-03 ENCOUNTER — Ambulatory Visit

## 2024-01-03 ENCOUNTER — Ambulatory Visit: Admitting: Occupational Therapy

## 2024-01-03 DIAGNOSIS — R41840 Attention and concentration deficit: Secondary | ICD-10-CM | POA: Diagnosis not present

## 2024-01-03 DIAGNOSIS — R4701 Aphasia: Secondary | ICD-10-CM

## 2024-01-03 DIAGNOSIS — R41841 Cognitive communication deficit: Secondary | ICD-10-CM

## 2024-01-03 DIAGNOSIS — I69318 Other symptoms and signs involving cognitive functions following cerebral infarction: Secondary | ICD-10-CM

## 2024-01-03 NOTE — Therapy (Signed)
 OUTPATIENT OCCUPATIONAL THERAPY NEURO Treatment  [name] MRN: [id number] DOB:[birth year], 73 y.o., female Today's Date: 01/03/2024  PCP: [name], NP REFERRING PROVIDER: [name], MD  END OF SESSION:  OT End of Session - 01/03/24 1612     Visit Number 7    Number of Visits 11    Date for OT Re-Evaluation 01/05/24    Authorization Type [employer]    OT Start Time 1403    OT Stop Time 1445    OT Time Calculation (min) 42 min    Activity Tolerance Patient tolerated treatment well    Behavior During Therapy [facility] for tasks assessed/performed                Past Medical History:  Diagnosis Date   Acute combined systolic and diastolic heart failure 10/11/2023   ADHD (attention deficit hyperactivity disorder) 05/09/2007   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; multiple punctate foci of restricted diffusion in bilateral frontal lobes and the left parietal lobe   Diverticulitis of colon (without mention of hemorrhage) 09/17/2013   Essential hypertension 10/09/2017   HFrEF (heart failure with reduced ejection fraction) 10/10/2023   History of COVID-19 2021   Hyperlipidemia    Hypothyroidism    Low back pain radiating to right leg 10/09/2017   Mild cognitive impairment of uncertain or unknown etiology 08/04/2022   Mild intermittent asthma with acute exacerbation 05/30/2017   Mild neurocognitive disorder due to multiple etiologies 12/19/2023   NSVT (nonsustained ventricular tachycardia) 10/12/2023   Osteoarthritis    Postmenopausal atrophic vaginitis, severe 08/18/2008   Right knee pain 03/13/2014   S/P arthroscopy of right knee 06/03/2014   Vitamin B12 deficiency    Past Surgical History:  Procedure Laterality Date   ABDOMINAL HYSTERECTOMY     BREAST SURGERY     CARPAL TUNNEL RELEASE Bilateral    CERVICAL DISC ARTHROPLASTY     CESAREAN SECTION     x2   left shoulder surgery     LOOP RECORDER  INSERTION N/A 10/12/2023   Procedure: LOOP RECORDER INSERTION;  Surgeon: Boyce Byes, MD;  Location: [facility] INVASIVE [facility];  Service: Cardiovascular;  Laterality: N/A;   MENISCUS REPAIR Right 2017   REDUCTION MAMMAPLASTY Bilateral    right shoulder     TONSILLECTOMY     Patient Active Problem List   Diagnosis Date Noted   Mild neurocognitive disorder due to multiple etiologies 12/19/2023   NSVT (nonsustained ventricular tachycardia) 10/12/2023   Acute combined systolic and diastolic heart failure 10/11/2023   HFrEF (heart failure with reduced ejection fraction) 10/10/2023   CVA (cerebral vascular accident) 10/08/2023   Mild cognitive impairment of uncertain or unknown etiology 08/04/2022   Vitamin B12 deficiency    Osteoarthritis 08/03/2022   Hyperlipidemia 08/03/2022   Essential hypertension 10/09/2017   Low back pain radiating to right leg 10/09/2017   Mild intermittent asthma with acute exacerbation 05/30/2017   S/P arthroscopy of right knee 06/03/2014   Right knee pain 03/13/2014   Diverticulitis of colon (without mention of hemorrhage) 09/17/2013   Postmenopausal atrophic vaginitis, severe 08/18/2008   Hypothyroidism 05/09/2007   ADHD (attention deficit hyperactivity disorder) 05/09/2007    ONSET DATE: 10/08/23  REFERRING DIAG: I63.9 (ICD-10-CM) - Cerebral infarction, unspecified  THERAPY DIAG:  Attention and concentration deficit  Other symptoms and signs involving cognitive functions following cerebral infarction  Rationale for Evaluation and Treatment: Rehabilitation  SUBJECTIVE:   SUBJECTIVE STATEMENT: Pt and spouse report  that they have been taking walks and attempting to incorporate cognitive tasks into walk.  Pt's spouse reports that pt is more fatigued when incorporating cognitive challenges vs only ambulation.    Pt accompanied by: self and spouse  PERTINENT HISTORY: ADHD, HTN, diverticulitis, HLD, hypothyroidism, low back pain, neck fusion, mild  cognitive impairment, OA in R knee, asthma   presented to [facility] ED on 10/08/2023 with sudden onset of left facial droop, left-sided weakness,and L visual cut. MRI: Multiple punctate acute infarcts bilateral frontal and left parietal lobes. EEG suggestive of moderate diffuse encephalopathy.   PRECAUTIONS: Fall  WEIGHT BEARING RESTRICTIONS: No  PAIN:  Are you having pain? No pain  FALLS: Has patient fallen in last 6 months? No  LIVING ENVIRONMENT: Lives with: lives with their spouse Lives in: House/apartment Stairs: Yes: Internal: bedroom/bathroom on main floor, does have a full flight of steps - spouse's office is upstairs  steps; one rail and External: 2 steps;   Has following equipment at home: shower chair and hand held shower head  PLOF: Independent, Independent with basic ADLs, and Leisure: walking up to 4-6 hours per day  PATIENT GOALS: I really don't know  OBJECTIVE:  Note: Objective measures were completed at Evaluation unless otherwise noted.  HAND DOMINANCE: Right  ADLs: Overall ADLs: Mod I with bathing and dressing, pt's spouse will go in and out of bathroom while pt in shower providing distant supervision to Mod I level Transfers/ambulation related to ADLs: Mod I without AD, getting into high bed without any issue  IADLs: Shopping: pt gets overwhelmed with everything that is going on, easily distracted  Meal Prep: spouse is doing most of the cooking right now as pt forgetful with sequencing and ingredients, and easily distracted Community mobility: not cleared to drive at this time Medication management: spouse is assisting now, he will fill up a daily pill box for pt   MOBILITY STATUS: Mod I without AD  POSTURE COMMENTS:  No Significant postural limitations  ACTIVITY TOLERANCE: Activity tolerance: pt reports worn out after 30 min walk (where as they would walk up to 2 hrs at a time before stroke).  Pt is not sleeping as much during the day time.  FUNCTIONAL  OUTCOME MEASURES: 9 hole peg test: Right: 36.54 sec; Left: 34.06 sec Trail Making: 42.47 sec for Trail A   UPPER EXTREMITY ROM:  WFL bilaterally  UPPER EXTREMITY MMT:     MMT Right eval Left eval  Shoulder flexion 4+ 4-  Shoulder abduction    Shoulder adduction    Shoulder extension    Shoulder internal rotation    Shoulder external rotation    Middle trapezius    Lower trapezius    Elbow flexion 4 4  Elbow extension 4- 4-  Wrist flexion    Wrist extension    Wrist ulnar deviation    Wrist radial deviation    Wrist pronation    Wrist supination    (Blank rows = not tested)  COORDINATION: 9 Hole Peg test: Right: 36.54 sec; Left: 34.06 sec Box and Blocks:  Right 43 blocks, Left 36blocks - Pt requiring cues x3 to remember to only pick up one block at a time when completing with Right; requiring cues to go over barrier instead of around (possibly slowing down pace even more).  SENSATION: WFL  COGNITION: Overall cognitive status: Impaired and History of cognitive impairments - at baseline Pt able to recall 3 words: sock blue bed after 5 mins  Pt with  difficulty with word finding and memory  VISION: Subjective report: wears glasses, reported f/u with neurology and ophthalmologist who report to wait for further assessment to allow recovery s/p CVA Baseline vision: Wears glasses all the time  VISION ASSESSMENT: To be further assessed in functional context Eye alignment: WFL Ocular ROM: WFL Gaze preference/alignment: WDL Tracking/Visual pursuits: TBA Visual Fields: TBA in functional context   Bell cancellation test: locating 32/35 in 3:00, pt then able to locate remaining 2/3 in 4:20 before stating I think that is all  PERCEPTION: Impaired  PRAXIS: Impaired: Initiation, Motor planning, and Organization  OBSERVATIONS: Difficult to assess vision, attention, and strength as pt requiring increased cueing and repeat directions.  Unsure full attention and effort during  MMT this session.  Pt forgetting instructions during coordination assessment, requiring wait time for recall and/or additional cues.                                                                                                                             TREATMENT DATE:  01/03/24 Logic puzzles: engaged in 4x4 puzzle to improve sequencing, deductive reasoning, memory and recall of unfamiliar activities.  Pt completed task with mod v/c for focusing on one written clue at a time, even breaking clues down to increase attention and success.  OT covering extra information with finger or paper to increase focus/attention to one task at a time.  Educated on functional carryover of reducing extra stimulus for increased success.  Attempted to fade cues, with pt still benefiting from cueing to stay on task.  OT providing question cues with pt able to complete task.  Provided pt with additional puzzles to complete at home.  Engaged in math symbol (with foods or items) activity to complete backwards chaining and deductive reasoning.  Pt demonstrating improved success with math activity, however with decreased ability to explain how she got to the answer. Cognitive/motor dual task: engaged in ambulating throughout therapy gym while completing common phrases to challenge memory, recall, and cognition.  Pt requiring increased time initially, however with improved spontaneous answers as task continued.    12/28/23 Engaged pt in functional mobility throughout busy and crowded space while maneuvering obstacles, attending to the L side, all while visually scanning and locating an item of specified color, able to perform with min verbal cues Engaged pt in dual task while walking to simulate walks with spouse and in community while engaging in conversation for sequencing and problem solving animal list going A-Z, note pt needed verbal cuing to attend to task, remember where leaving off, and at times unable to name next  animal despite max cues, spelling name of animal, description, etc. Relayed to spouse to continue performing dual tasks at home and on walks.  Phone Use: engaged in using cell phone for familiar object with simple cues like call/text [name] and pt needing extended time and min verbal cues to locate icons and perform task.     12/20/23 Logic  puzzles: 3x3 puzzle to improve sequencing, deductive reasoning, memory and recall of unfamiliar activities. Pt completed task with mod v/c for deductive reasoning components of task.  OT provided pt and spouse with sample logic questions (without grid) to challenge sequencing and deductive reasoning. Tangram: engaged in tangram puzzle to focus on vision, sequencing, attention, and coordination.  Pt demonstrating occasional difficulty with picking up small pieces with R hand.  OT then incorporating conversation while pt completing to challenge alternating attention.  Pt demonstrating good attention to task, with mild pauses to engage in conversation but able to return to task. Community reintegration: engaged in education on slowly returning to community reintegration as pt expressing desire to return to church. Spouse expressing that an MD had warned against being in places with too many people who may be unknowingly sick and adding a recovery from illness to detract from focus on recovery from stroke. OT encouraged pt to f/u with MD in regards to when she can increase community participation from a health standpoint.  OT educated on going out during off peak times, getting to church or parties early and sitting down before the rush and letting people come to her vs walking through/around crowds.     PATIENT EDUCATION: Education details: see today's tx above Person educated: Patient and Spouse Education method: Explanation and Handouts Education comprehension: verbalized understanding and needs further education  HOME EXERCISE PROGRAM: Provided written  suggestions for keeping thinking skills sharp - plan to provide printout at future session 12/13/23 - memory compensation strategies and activities to keep thinking skills sharp (see pt instructions)   GOALS: Goals reviewed with patient? Yes  SHORT TERM GOALS: Target date: 12/15/23  Pt will be independent with coordination HEP with visual handouts for recall. Baseline: new to OPOT Goal status: in progress  2.  Pt will demonstrate understanding of memory compensations and ways to keep thinking skills sharp Baseline: STM deficits and impaired initiation Goal status: in progress  3.  Pt will demonstrate and/or verbalize understanding of visual attention exercises and compensatory strategies for improved visual scanning and attention. Baseline: left field cut Goal status: in progress  4.  Pt will demonstrate improved coordination, attention to task, and recall of instructions by completing box and blocks assessment with improved score by 6 blocks bilaterally. Baseline: Right 43 blocks, Left 36blocks - Pt requiring cues x3 to remember to only pick up one block at a time when completing with Right; requiring cues to go over barrier instead of around (possibly slowing down pace even more). Goal status: in progress   LONG TERM GOALS: Target date: 01/05/24  Pt will verbalize understanding of task modifications, adaptive strategies, and/or potential AE needs to increase ease, safety, and independence w/ IADLs. Baseline: decreased engagement in IADLs due to impaired memory and attention Goal status: in progress  2.  Pt will complete simulated medication management activity w/ Supervision by discharge, incorporating compensatory strategies/AE prn Baseline: spouse managing meds at this time, was not prior to CVA Goal status: in progress  3.  Pt will demonstrate ability to sequence simple functional task (simple snack prep, laundry task, etc) at Mod I level with good safety awareness and ability  to utilize memory strategies for recall. Baseline:  Goal status: in progress  4.  Pt will navigate a moderately busy environment, completing dual task activity and/or following multi-step commands with 90% accuracy Baseline:  Goal status: in progress  5.  Pt will demonstrate improved initiation and sequencing to be able to  navigate use of personal cell phone and send text messages in <15 mins. Baseline: spouse reporting 45 mins to reply to test message Goal status: in progress   ASSESSMENT:  CLINICAL IMPRESSION: Pt tolerated tasks well this session.  Pt is an active participant in cognitive retraining activities, dual tasks, and sequencing - note pt is very pleasant and is aware that she does have cognitive and memory deficits. Pt continues to benefit from mod, question cues for attention to and engagement in cognitive (logic puzzle) tasks.  Pt and spouse receptive to education on continued dual tasking and/or incorporation of cognitive challenges during mobility tasks.     PERFORMANCE DEFICITS: in functional skills including ADLs, IADLs, coordination, strength, Fine motor control, Gross motor control, endurance, decreased knowledge of precautions, vision, and UE functional use, cognitive skills including attention, energy/drive, memory, problem solving, safety awareness, and sequencing, and psychosocial skills including environmental adaptation and routines and behaviors.     PLAN:  OT FREQUENCY: 1-2x/week  OT DURATION: 6 weeks  PLANNED INTERVENTIONS: 97168 OT Re-evaluation, 97535 self care/ADL training, 02725 therapeutic exercise, 97530 therapeutic activity, 97112 neuromuscular re-education, functional mobility training, visual/perceptual remediation/compensation, psychosocial skills training, energy conservation, coping strategies training, patient/family education, and DME and/or AE instructions  RECOMMENDED OTHER SERVICES: SLP  CONSULTED AND AGREED WITH PLAN OF CARE: Patient and  family member/caregiver  PLAN FOR NEXT SESSION:   Cognitive/motor dual tasking, sequencing, recall of 2 step directions  Phone use  Further assess during functional tasks, review PRN visual compensatory strategies (especially during functional tasks)  Visual scanning tasks  Sequencing tasks    Anthonette Kinsman, OTR/L 01/03/2024, 4:13 PM  [facility] [address] [place], [place] [address] Phone # [phone] Fax # [phone]

## 2024-01-03 NOTE — Therapy (Signed)
 OUTPATIENT SPEECH LANGUAGE PATHOLOGY TREATMENT   [name] MRN: [id number] DOB:1951-02-13, 73 y.o., female Today's Date: 01/03/2024  PCP: [name], NP REFERRING PROVIDER: [name], MD  END OF SESSION:  End of Session - 01/03/24 1331     Visit Number 7    Number of Visits 17    Date for SLP Re-Evaluation 02/02/24    SLP Start Time 1321    SLP Stop Time  1400    SLP Time Calculation (min) 39 min    Activity Tolerance Patient tolerated treatment well             Past Medical History:  Diagnosis Date   Acute combined systolic and diastolic heart failure 10/11/2023   ADHD (attention deficit hyperactivity disorder) 05/09/2007   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; multiple punctate foci of restricted diffusion in bilateral frontal lobes and the left parietal lobe   Diverticulitis of colon (without mention of hemorrhage) 09/17/2013   Essential hypertension 10/09/2017   HFrEF (heart failure with reduced ejection fraction) 10/10/2023   History of COVID-19 2021   Hyperlipidemia    Hypothyroidism    Low back pain radiating to right leg 10/09/2017   Mild cognitive impairment of uncertain or unknown etiology 08/04/2022   Mild intermittent asthma with acute exacerbation 05/30/2017   Mild neurocognitive disorder due to multiple etiologies 12/19/2023   NSVT (nonsustained ventricular tachycardia) 10/12/2023   Osteoarthritis    Postmenopausal atrophic vaginitis, severe 08/18/2008   Right knee pain 03/13/2014   S/P arthroscopy of right knee 06/03/2014   Vitamin B12 deficiency    Past Surgical History:  Procedure Laterality Date   ABDOMINAL HYSTERECTOMY     BREAST SURGERY     CARPAL TUNNEL RELEASE Bilateral    CERVICAL DISC ARTHROPLASTY     CESAREAN SECTION     x2   left shoulder surgery     LOOP RECORDER INSERTION N/A 10/12/2023   Procedure: LOOP RECORDER INSERTION;  Surgeon: Boyce Byes, MD;  Location:  [facility];  Service: Cardiovascular;  Laterality: N/A;   MENISCUS REPAIR Right 2017   REDUCTION MAMMAPLASTY Bilateral    right shoulder     TONSILLECTOMY     Patient Active Problem List   Diagnosis Date Noted   Mild neurocognitive disorder due to multiple etiologies 12/19/2023   NSVT (nonsustained ventricular tachycardia) 10/12/2023   Acute combined systolic and diastolic heart failure 10/11/2023   HFrEF (heart failure with reduced ejection fraction) 10/10/2023   CVA (cerebral vascular accident) 10/08/2023   Mild cognitive impairment of uncertain or unknown etiology 08/04/2022   Vitamin B12 deficiency    Osteoarthritis 08/03/2022   Hyperlipidemia 08/03/2022   Essential hypertension 10/09/2017   Low back pain radiating to right leg 10/09/2017   Mild intermittent asthma with acute exacerbation 05/30/2017   S/P arthroscopy of right knee 06/03/2014   Right knee pain 03/13/2014   Diverticulitis of colon (without mention of hemorrhage) 09/17/2013   Postmenopausal atrophic vaginitis, severe 08/18/2008   Hypothyroidism 05/09/2007   ADHD (attention deficit hyperactivity disorder) 05/09/2007    ONSET DATE:  10/08/23  REFERRING DIAG: G95.621 (ICD-10-CM) - Aphasia as late effect of stroke  THERAPY DIAG:  Cognitive communication deficit  Aphasia  Rationale for Evaluation and Treatment: Rehabilitation  SUBJECTIVE:   SUBJECTIVE STATEMENT: Pt returns with homework completed.   Pt accompanied by: self   PERTINENT HISTORY: HPI: Pt presented to [facility] ED on 10/08/2023 with sudden onset of left facial  droop and left-sided weakness. ADHD, HTN, diverticulitis, HLD, hypothyroidism, low back pain, neck fusion, mild cognitive impairment (dx Jan 2024), cervical disc arthroplasty, OA in R knee, asthma   PAIN:  Are you having pain? No  FALLS: Has patient fallen in last 6 months?  No  PATIENT GOALS: Improve language function, memory  OBJECTIVE:  Note: Objective measures were completed  at Evaluation unless otherwise noted.  DIAGNOSTIC FINDINGS:  MRI 10/09/23 IMPRESSION: 1. Findings compatible with multiple punctate acute infarcts in bilateral frontal and left parietal lobes. Given involvement of multiple vascular territories, consider an embolic etiology. 2. Significantly motion limited study.   Date of Discharge from SLP service:October 19, 2023 Clinical Impression/Discharge Summary: Pt has made great gains and has met 4 of 6 LTG's this admission due to improved cognition and language. Pt is currently an overall mod I A for orientation tasks, though requires up to [facility] for attention. Patient requires mod I A for expressive language, though continues to require occasional min-modA for complex receptive language tasks.  Pt/family education complete and pt will discharge home with 24 hour supervision from friends/family/etc. Pt would benefit from OP f/u ST services to maximize cognition and communication in order to maximize functional independence.     STANDARDIZED ASSESSMENTS:   Cognitive Linguistic Quick Test  AGE - 70-89  The Cognitive Linguistic Quick Test (CLQT) was administered to assess the relative status of five cognitive domains: attention, memory, language, executive functioning, and visuospatial skills. Scores from 10 tasks were used to estimate severity ratings (standardized for age groups 18-69 years and 70-89 years) for each domain, a clock drawing task, as well as an overall composite severity rating of cognition.     Task Score Criterion Cut Scores  Personal Facts 8/8 8  Symbol Cancellation 12/12 10  Confrontation Naming 10/10 10  Clock Drawing  12/13 11  Story Retelling 8/10 5  Symbol Trails 10/10 6  Generative Naming 3/9 4  Design Memory 4/6 4  Mazes  4/8 4  Design Generation 3/13 5   Cognitive Domain Composite Score Severity Rating  Attention 181/215 WNL  Memory 147/185 WNL  Executive Function 20/40 Low-WNL  Language 29/37 Low-WNL  Visuospatial  Skills 75/105 WNL  Clock Drawing  12/13 WNL  Composite Severity Rating  WNL     PATIENT REPORTED OUTCOME MEASURES (PROM): Communication Effectiveness Survey: 12/20/23 - pt scored herself 18/32, with higher scores indicating less of a negative effect of pt's deficits on daily life and QOL.                                                                                                                              TREATMENT DATE:  Semantic Feature Analysis (SFA)  01/03/24: Pt brought homework in completed. She enjoyed sequencing task. With simple problem solving tasks pt req'd rare min A, but performance was functional.  Her speech today was WNL rate, without anomia. When she has anomia, she uses synonym or  description strategy. SLP observed pt to use description strategy in previous sessions. Homework provided.  01/01/24: Pt did not complete any SFA since last session. SLP told pt to complete when she has anomia with a noun. Pt brought homework with her completed. She finished 40 wrtiten definitions in approx 90 minutes. Today SLP targeted simple problem solving with 5-step sequencing task. Pt took almost 10 minutes to complete 4 sequences. She was provided with homework until next session.  12/28/23: Pt brought in books that daughter bought her. SLP to look at these and star pages SLP thinks would be helpful for pt. SLP worked with pt today with SFA - grill. She req'd mod A occasionally for procedure and for more accurate wording, especially description. SLP provided homework for description.  12/25/23:  Minimal difficulty with letter fill in homework. Focusing on problem solving in  cognitive linguistics today, SLP assisted pt with scrambled sentences.Four to 5-word sentences were completed with 4/4 success. When pt worked on 7-9 word sentences with SLP she had intermittent difficulty with mental flexibility with word choice. SLP provided mod cues faded to min nonverbal cues. Pt's attention/memory  deficit made alternating attention difficult at times so SLP suggested pt write the word order above the words and this helped pt compensate for decr'd alternating attention.  SLP provided compensations for anomia for pt and educated her and [name] about these.   12/20/23: PROM provided today, with score above. [name] cueing pt about the accuracy of her answers. Needs anomia compensations next session. SLP asked [name] to fill out a CES as well for SLP to compare. Pt completed 3 clues task with 90% success, however only named average 2 items in a common category for divergent naming prior to requiring consistent mod-max  A. SLP provided letter fill ins for homework, and divergent naming for flowers in her yard, and for sports.  12/14/23: PT NEEDS PROM NEXT SESSION. SLP completed CLQT today. From pt's CLQT score it would indicate that although pt's Composite Score is WNL she had difficulty with processing speed (mazes, design generation), problem solving Forensic psychologist), memory Training and development officer memory), and language (generative naming). Goals added.   12/07/23: SLP educated pt and husband [name]) with describing phonemic cues, as this appeared to help pt during BNT. SLP provided examples during BNT and told [name] to assist pt in this way if he is sure of the word pt means to say. Secondly, SLP educated pt and husband on how to complete semantic feature analysis (SFA) at home with words/nouns pt experiences anomia with. SLP explained benefits of SFA to pt and husband. [name] took notes during session. SLP provided handouts for this framework.  PATIENT EDUCATION: Education details: see treatment date Person educated: Patient and Spouse Education method: Explanation, Demonstration, and Handouts Education comprehension: verbalized understanding and needs further education   GOALS: Goals reviewed with patient? Yes in general  SHORT TERM GOALS: Target date: 01/05/24  Pt will complete cognitive linguistic  assessment in first 2 sessions  Baseline: Goal status: met  2.  Pt will successfully use anomia compensations with min A to do so, for 10 minutes functional simple conversation in 2 sessions Baseline:  Goal status: partially met  3.  Pt will demo how to use SFA with rare min A in 3 sessions Baseline: 01/03/24 Goal status: partially met  4.  Pt will demo [facility] problem solving skills in simple cognitive linguistic tasks in 2 sessions Baseline: 01/03/24 Goal status: partially met   LONG TERM GOALS: Target  date: 02/02/24  Pt will improve PROM compared to initial administration Baseline:  Goal status: INITIAL  2.  Pt will participate in 10 minutes mod complex conversation with functional expressive language (using anomia compensations) in 3 sessions Baseline:  Goal status: INITIAL  3.  Pt will demo functional problem solving skills in practical cognitive linguistic tasks in 3 sessions Baseline:  Goal status: INITIAL   ASSESSMENT:  CLINICAL IMPRESSION: STGs partially met. Patient is a 73 y.o. F who was seen today for treatment of aphasia and problem solving in light of bilateral frontal CVA, and parietal CVA. See treatment date above for today's date for further details on today's session. Given her premorbid dx of ADHD and MCI and OT focus on other areas identified on CLQT, SLP will focus on problem solving in functional cognitive linguistic tasks. She also demonstrated s/sx of cognitive linguistic impairment requiring a full evaluation in the first 2 sessions. Pt with premorbid dx of ADHD and MCI.   OBJECTIVE IMPAIRMENTS: include attention, memory, awareness, and aphasia. These impairments are limiting patient from managing medications, managing appointments, managing finances, household responsibilities, ADLs/IADLs, and effectively communicating at home and in community. Factors affecting potential to achieve goals and functional outcome are ability to learn/carryover information and  previous level of function. Patient will benefit from skilled SLP services to address above impairments and improve overall function.  REHAB POTENTIAL: Good  PLAN:  SLP FREQUENCY: 2x/week  SLP DURATION: 8 weeks  PLANNED INTERVENTIONS: Language facilitation, Environmental controls, Cueing hierachy, Cognitive reorganization, Internal/external aids, Functional tasks, Multimodal communication approach, SLP instruction and feedback, Compensatory strategies, Patient/family education, [phone] Treatment of speech (30 or 45 min) , and [phone] (Standardized cognitive assessment)    Shalene Gallen, CCC-SLP 01/03/2024, 1:31 PM

## 2024-01-08 ENCOUNTER — Ambulatory Visit

## 2024-01-08 ENCOUNTER — Ambulatory Visit: Admitting: Occupational Therapy

## 2024-01-08 DIAGNOSIS — M6281 Muscle weakness (generalized): Secondary | ICD-10-CM

## 2024-01-08 DIAGNOSIS — R4701 Aphasia: Secondary | ICD-10-CM

## 2024-01-08 DIAGNOSIS — I69318 Other symptoms and signs involving cognitive functions following cerebral infarction: Secondary | ICD-10-CM

## 2024-01-08 DIAGNOSIS — I6340 Cerebral infarction due to embolism of unspecified cerebral artery: Secondary | ICD-10-CM

## 2024-01-08 DIAGNOSIS — R41840 Attention and concentration deficit: Secondary | ICD-10-CM | POA: Diagnosis not present

## 2024-01-08 DIAGNOSIS — R41841 Cognitive communication deficit: Secondary | ICD-10-CM

## 2024-01-08 NOTE — Therapy (Signed)
 OUTPATIENT SPEECH LANGUAGE PATHOLOGY TREATMENT   [name] MRN: [id number] DOB:1951/02/28, 73 y.o., female Today's Date: 01/08/2024  PCP: Merna Huxley, NP REFERRING PROVIDER: Carilyn Prentice BRAVO, MD  END OF SESSION:  End of Session - 01/08/24 1649     Visit Number 8    Number of Visits 17    Date for SLP Re-Evaluation 02/02/24    SLP Start Time 1450    SLP Stop Time  1530    SLP Time Calculation (min) 40 min    Activity Tolerance Patient tolerated treatment well              Past Medical History:  Diagnosis Date   Acute combined systolic and diastolic heart failure 10/11/2023   ADHD (attention deficit hyperactivity disorder) 05/09/2007   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; multiple punctate foci of restricted diffusion in bilateral frontal lobes and the left parietal lobe   Diverticulitis of colon (without mention of hemorrhage) 09/17/2013   Essential hypertension 10/09/2017   HFrEF (heart failure with reduced ejection fraction) 10/10/2023   History of COVID-19 2021   Hyperlipidemia    Hypothyroidism    Low back pain radiating to right leg 10/09/2017   Mild cognitive impairment of uncertain or unknown etiology 08/04/2022   Mild intermittent asthma with acute exacerbation 05/30/2017   Mild neurocognitive disorder due to multiple etiologies 12/19/2023   NSVT (nonsustained ventricular tachycardia) 10/12/2023   Osteoarthritis    Postmenopausal atrophic vaginitis, severe 08/18/2008   Right knee pain 03/13/2014   S/P arthroscopy of right knee 06/03/2014   Vitamin B12 deficiency    Past Surgical History:  Procedure Laterality Date   ABDOMINAL HYSTERECTOMY     BREAST SURGERY     CARPAL TUNNEL RELEASE Bilateral    CERVICAL DISC ARTHROPLASTY     CESAREAN SECTION     x2   left shoulder surgery     LOOP RECORDER INSERTION N/A 10/12/2023   Procedure: LOOP RECORDER INSERTION;  Surgeon: Cindie Ole DASEN, MD;   Location: [facility];  Service: Cardiovascular;  Laterality: N/A;   MENISCUS REPAIR Right 2017   REDUCTION MAMMAPLASTY Bilateral    right shoulder     TONSILLECTOMY     Patient Active Problem List   Diagnosis Date Noted   Mild neurocognitive disorder due to multiple etiologies 12/19/2023   NSVT (nonsustained ventricular tachycardia) 10/12/2023   Acute combined systolic and diastolic heart failure 10/11/2023   HFrEF (heart failure with reduced ejection fraction) 10/10/2023   CVA (cerebral vascular accident) 10/08/2023   Mild cognitive impairment of uncertain or unknown etiology 08/04/2022   Vitamin B12 deficiency    Osteoarthritis 08/03/2022   Hyperlipidemia 08/03/2022   Essential hypertension 10/09/2017   Low back pain radiating to right leg 10/09/2017   Mild intermittent asthma with acute exacerbation 05/30/2017   S/P arthroscopy of right knee 06/03/2014   Right knee pain 03/13/2014   Diverticulitis of colon (without mention of hemorrhage) 09/17/2013   Postmenopausal atrophic vaginitis, severe 08/18/2008   Hypothyroidism 05/09/2007   ADHD (attention deficit hyperactivity disorder) 05/09/2007    ONSET DATE:  10/08/23  REFERRING DIAG: P30.679 (ICD-10-CM) - Aphasia as late effect of stroke  THERAPY DIAG:  Aphasia  Cognitive communication deficit  Rationale for Evaluation and Treatment: Rehabilitation  SUBJECTIVE:   SUBJECTIVE STATEMENT: Pt returns with homework somewhat completed. Visibly tired today.   Pt accompanied by: self   PERTINENT HISTORY: HPI: Pt presented to [facility] ED on 10/08/2023 with  sudden onset of left facial droop and left-sided weakness. ADHD, HTN, diverticulitis, HLD, hypothyroidism, low back pain, neck fusion, mild cognitive impairment (dx Jan 2024), cervical disc arthroplasty, OA in R knee, asthma   PAIN:  Are you having pain? No  FALLS: Has patient fallen in last 6 months?  No  PATIENT GOALS: Improve language function, memory  OBJECTIVE:   Note: Objective measures were completed at Evaluation unless otherwise noted.  DIAGNOSTIC FINDINGS:  MRI 10/09/23 IMPRESSION: 1. Findings compatible with multiple punctate acute infarcts in bilateral frontal and left parietal lobes. Given involvement of multiple vascular territories, consider an embolic etiology. 2. Significantly motion limited study.   Date of Discharge from SLP service:October 19, 2023 Clinical Impression/Discharge Summary: Pt has made great gains and has met 4 of 6 LTG's this admission due to improved cognition and language. Pt is currently an overall mod I A for orientation tasks, though requires up to [facility] for attention. Patient requires mod I A for expressive language, though continues to require occasional min-modA for complex receptive language tasks.  Pt/family education complete and pt will discharge home with 24 hour supervision from friends/family/etc. Pt would benefit from OP f/u ST services to maximize cognition and communication in order to maximize functional independence.     STANDARDIZED ASSESSMENTS:   Cognitive Linguistic Quick Test  AGE - 70-89  The Cognitive Linguistic Quick Test (CLQT) was administered to assess the relative status of five cognitive domains: attention, memory, language, executive functioning, and visuospatial skills. Scores from 10 tasks were used to estimate severity ratings (standardized for age groups 18-69 years and 70-89 years) for each domain, a clock drawing task, as well as an overall composite severity rating of cognition.     Task Score Criterion Cut Scores  Personal Facts 8/8 8  Symbol Cancellation 12/12 10  Confrontation Naming 10/10 10  Clock Drawing  12/13 11  Story Retelling 8/10 5  Symbol Trails 10/10 6  Generative Naming 3/9 4  Design Memory 4/6 4  Mazes  4/8 4  Design Generation 3/13 5   Cognitive Domain Composite Score Severity Rating  Attention 181/215 WNL  Memory 147/185 WNL  Executive Function 20/40 Low-WNL   Language 29/37 Low-WNL  Visuospatial Skills 75/105 WNL  Clock Drawing  12/13 WNL  Composite Severity Rating  WNL     PATIENT REPORTED OUTCOME MEASURES (PROM): Communication Effectiveness Survey: 12/20/23 - pt scored herself 18/32, with higher scores indicating less of a negative effect of pt's deficits on daily life and QOL.                                                                                                                              TREATMENT DATE:  Semantic Feature Analysis (SFA)  01/08/24: Pt with 3 hour visit this morning from sister, and brother (from [facility]). Today SLP helped pt with occasional mod A with problem solving/sequencing task. She req'd extra time and redirection at times with 13-step  sequencing task. Took pt 17 minutes for this task. Level of fatigue at which pt entered may have played a role.  [name] had question about 6-step sequencing tasks- he could not find any online. SLP suggested he and pt think through some everyday tasks and write down 6 steps in them and SLP provided 7-8 examples.  01/03/24: Pt brought homework in completed. She enjoyed sequencing task. With simple problem solving tasks pt req'd rare min A, but performance was functional.  Her speech today was WNL rate, without anomia. When she has anomia, she uses synonym or description strategy. SLP observed pt to use description strategy in previous sessions. Homework provided.  01/01/24: Pt did not complete any SFA since last session. SLP told pt to complete when she has anomia with a noun. Pt brought homework with her completed. She finished 40 wrtiten definitions in approx 90 minutes. Today SLP targeted simple problem solving with 5-step sequencing task. Pt took almost 10 minutes to complete 4 sequences. She was provided with homework until next session.  12/28/23: Pt brought in books that daughter bought her. SLP to look at these and star pages SLP thinks would be helpful for pt. SLP worked with pt  today with SFA - grill. She req'd mod A occasionally for procedure and for more accurate wording, especially description. SLP provided homework for description.  12/25/23:  Minimal difficulty with letter fill in homework. Focusing on problem solving in  cognitive linguistics today, SLP assisted pt with scrambled sentences.Four to 5-word sentences were completed with 4/4 success. When pt worked on 7-9 word sentences with SLP she had intermittent difficulty with mental flexibility with word choice. SLP provided mod cues faded to min nonverbal cues. Pt's attention/memory deficit made alternating attention difficult at times so SLP suggested pt write the word order above the words and this helped pt compensate for decr'd alternating attention.  SLP provided compensations for anomia for pt and educated her and [name] about these.   12/20/23: PROM provided today, with score above. [name] cueing pt about the accuracy of her answers. Needs anomia compensations next session. SLP asked [name] to fill out a CES as well for SLP to compare. Pt completed 3 clues task with 90% success, however only named average 2 items in a common category for divergent naming prior to requiring consistent mod-max  A. SLP provided letter fill ins for homework, and divergent naming for flowers in her yard, and for sports.  12/14/23: PT NEEDS PROM NEXT SESSION. SLP completed CLQT today. From pt's CLQT score it would indicate that although pt's Composite Score is WNL she had difficulty with processing speed (mazes, design generation), problem solving Forensic psychologist), memory Training and development officer memory), and language (generative naming). Goals added.   12/07/23: SLP educated pt and husband [name]) with describing phonemic cues, as this appeared to help pt during BNT. SLP provided examples during BNT and told [name] to assist pt in this way if he is sure of the word pt means to say. Secondly, SLP educated pt and husband on how to complete semantic  feature analysis (SFA) at home with words/nouns pt experiences anomia with. SLP explained benefits of SFA to pt and husband. [name] took notes during session. SLP provided handouts for this framework.  PATIENT EDUCATION: Education details: see treatment date Person educated: Patient and Spouse Education method: Explanation, Demonstration, and Handouts Education comprehension: verbalized understanding and needs further education   GOALS: Goals reviewed with patient? Yes in general  SHORT TERM GOALS: Target date: 01/05/24  Pt will complete cognitive linguistic assessment in first 2 sessions  Baseline: Goal status: met  2.  Pt will successfully use anomia compensations with min A to do so, for 10 minutes functional simple conversation in 2 sessions Baseline:  Goal status: partially met  3.  Pt will demo how to use SFA with rare min A in 3 sessions Baseline: 01/03/24 Goal status: partially met  4.  Pt will demo [facility] problem solving skills in simple cognitive linguistic tasks in 2 sessions Baseline: 01/03/24 Goal status: partially met   LONG TERM GOALS: Target date: 02/02/24  Pt will improve PROM compared to initial administration Baseline:  Goal status: INITIAL  2.  Pt will participate in 10 minutes mod complex conversation with functional expressive language (using anomia compensations) in 3 sessions Baseline:  Goal status: INITIAL  3.  Pt will demo functional problem solving skills in practical cognitive linguistic tasks in 3 sessions Baseline:  Goal status: INITIAL   ASSESSMENT:  CLINICAL IMPRESSION: Patient is a 73 y.o. F who was seen today for treatment of aphasia and problem solving in light of bilateral frontal CVA, and parietal CVA. See treatment date above for today's date for further details on today's session. Given her premorbid dx of ADHD and MCI and OT focus on other areas identified on CLQT, SLP will focus on problem solving in functional cognitive  linguistic tasks. She also demonstrated s/sx of cognitive linguistic impairment requiring a full evaluation in the first 2 sessions. Pt with premorbid dx of ADHD and MCI.   OBJECTIVE IMPAIRMENTS: include attention, memory, awareness, and aphasia. These impairments are limiting patient from managing medications, managing appointments, managing finances, household responsibilities, ADLs/IADLs, and effectively communicating at home and in community. Factors affecting potential to achieve goals and functional outcome are ability to learn/carryover information and previous level of function. Patient will benefit from skilled SLP services to address above impairments and improve overall function.  REHAB POTENTIAL: Good  PLAN:  SLP FREQUENCY: 2x/week  SLP DURATION: 8 weeks  PLANNED INTERVENTIONS: Language facilitation, Environmental controls, Cueing hierachy, Cognitive reorganization, Internal/external aids, Functional tasks, Multimodal communication approach, SLP instruction and feedback, Compensatory strategies, Patient/family education, [phone] Treatment of speech (30 or 45 min) , and [id number] (Standardized cognitive assessment)    Mahogani Holohan, CCC-SLP 01/08/2024, 4:50 PM

## 2024-01-08 NOTE — Therapy (Signed)
 OUTPATIENT OCCUPATIONAL THERAPY NEURO Treatment  [name] MRN: [id number] DOB:[birth year], 73 y.o., female Today's Date: 01/08/2024  PCP: Merna Huxley, NP REFERRING PROVIDER: Carilyn Prentice BRAVO, MD  END OF SESSION:  OT End of Session - 01/08/24 1448     Visit Number 8    Number of Visits 16    Date for OT Re-Evaluation 03/04/24    Authorization Type [employer]    OT Start Time 1404    OT Stop Time 1446    OT Time Calculation (min) 42 min    Activity Tolerance Patient tolerated treatment well    Behavior During Therapy WFL for tasks assessed/performed                 Past Medical History:  Diagnosis Date   Acute combined systolic and diastolic heart failure 10/11/2023   ADHD (attention deficit hyperactivity disorder) 05/09/2007   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; multiple punctate foci of restricted diffusion in bilateral frontal lobes and the left parietal lobe   Diverticulitis of colon (without mention of hemorrhage) 09/17/2013   Essential hypertension 10/09/2017   HFrEF (heart failure with reduced ejection fraction) 10/10/2023   History of COVID-19 2021   Hyperlipidemia    Hypothyroidism    Low back pain radiating to right leg 10/09/2017   Mild cognitive impairment of uncertain or unknown etiology 08/04/2022   Mild intermittent asthma with acute exacerbation 05/30/2017   Mild neurocognitive disorder due to multiple etiologies 12/19/2023   NSVT (nonsustained ventricular tachycardia) 10/12/2023   Osteoarthritis    Postmenopausal atrophic vaginitis, severe 08/18/2008   Right knee pain 03/13/2014   S/P arthroscopy of right knee 06/03/2014   Vitamin B12 deficiency    Past Surgical History:  Procedure Laterality Date   ABDOMINAL HYSTERECTOMY     BREAST SURGERY     CARPAL TUNNEL RELEASE Bilateral    CERVICAL DISC ARTHROPLASTY     CESAREAN SECTION     x2   left shoulder surgery     LOOP  RECORDER INSERTION N/A 10/12/2023   Procedure: LOOP RECORDER INSERTION;  Surgeon: Cindie Ole DASEN, MD;  Location: [facility];  Service: Cardiovascular;  Laterality: N/A;   MENISCUS REPAIR Right 2017   REDUCTION MAMMAPLASTY Bilateral    right shoulder     TONSILLECTOMY     Patient Active Problem List   Diagnosis Date Noted   Mild neurocognitive disorder due to multiple etiologies 12/19/2023   NSVT (nonsustained ventricular tachycardia) 10/12/2023   Acute combined systolic and diastolic heart failure 10/11/2023   HFrEF (heart failure with reduced ejection fraction) 10/10/2023   CVA (cerebral vascular accident) 10/08/2023   Mild cognitive impairment of uncertain or unknown etiology 08/04/2022   Vitamin B12 deficiency    Osteoarthritis 08/03/2022   Hyperlipidemia 08/03/2022   Essential hypertension 10/09/2017   Low back pain radiating to right leg 10/09/2017   Mild intermittent asthma with acute exacerbation 05/30/2017   S/P arthroscopy of right knee 06/03/2014   Right knee pain 03/13/2014   Diverticulitis of colon (without mention of hemorrhage) 09/17/2013   Postmenopausal atrophic vaginitis, severe 08/18/2008   Hypothyroidism 05/09/2007   ADHD (attention deficit hyperactivity disorder) 05/09/2007    ONSET DATE: 10/08/23  REFERRING DIAG: I63.9 (ICD-10-CM) - Cerebral infarction, unspecified  THERAPY DIAG:  Attention and concentration deficit  Other symptoms and signs involving cognitive functions following cerebral infarction  Muscle weakness (generalized)  Cerebrovascular accident (CVA) due to embolism of cerebral artery (HCC)  Rationale for Evaluation and Treatment: Rehabilitation  SUBJECTIVE:   SUBJECTIVE STATEMENT: Pt reports very tired today as she spent about 3 hours visiting with her brother and sister.  Pt reports not realizing that just talking would make her feel so tired.    Pt accompanied by: self and spouse  PERTINENT HISTORY: ADHD, HTN,  diverticulitis, HLD, hypothyroidism, low back pain, neck fusion, mild cognitive impairment, OA in R knee, asthma   presented to [facility] ED on 10/08/2023 with sudden onset of left facial droop, left-sided weakness,and L visual cut. MRI: Multiple punctate acute infarcts bilateral frontal and left parietal lobes. EEG suggestive of moderate diffuse encephalopathy.   PRECAUTIONS: Fall  WEIGHT BEARING RESTRICTIONS: No  PAIN:  Are you having pain? No pain  FALLS: Has patient fallen in last 6 months? No  LIVING ENVIRONMENT: Lives with: lives with their spouse Lives in: House/apartment Stairs: Yes: Internal: bedroom/bathroom on main floor, does have a full flight of steps - spouse's office is upstairs  steps; one rail and External: 2 steps;   Has following equipment at home: shower chair and hand held shower head  PLOF: Independent, Independent with basic ADLs, and Leisure: walking up to 4-6 hours per day  PATIENT GOALS: I really don't know  OBJECTIVE:  Note: Objective measures were completed at Evaluation unless otherwise noted.  HAND DOMINANCE: Right  ADLs: Overall ADLs: Mod I with bathing and dressing, pt's spouse will go in and out of bathroom while pt in shower providing distant supervision to Mod I level Transfers/ambulation related to ADLs: Mod I without AD, getting into high bed without any issue  IADLs: Shopping: pt gets overwhelmed with everything that is going on, easily distracted  Meal Prep: spouse is doing most of the cooking right now as pt forgetful with sequencing and ingredients, and easily distracted Community mobility: not cleared to drive at this time Medication management: spouse is assisting now, he will fill up a daily pill box for pt   MOBILITY STATUS: Mod I without AD  POSTURE COMMENTS:  No Significant postural limitations  ACTIVITY TOLERANCE: Activity tolerance: pt reports worn out after 30 min walk (where as they would walk up to 2 hrs at a time before  stroke).  Pt is not sleeping as much during the day time.  FUNCTIONAL OUTCOME MEASURES: 9 hole peg test: Right: 36.54 sec; Left: 34.06 sec Trail Making: 42.47 sec for Trail A   UPPER EXTREMITY ROM:  WFL bilaterally  UPPER EXTREMITY MMT:     MMT Right eval Left eval  Shoulder flexion 4+ 4-  Shoulder abduction    Shoulder adduction    Shoulder extension    Shoulder internal rotation    Shoulder external rotation    Middle trapezius    Lower trapezius    Elbow flexion 4 4  Elbow extension 4- 4-  Wrist flexion    Wrist extension    Wrist ulnar deviation    Wrist radial deviation    Wrist pronation    Wrist supination    (Blank rows = not tested)  COORDINATION: 9 Hole Peg test: Right: 36.54 sec; Left: 34.06 sec Box and Blocks:  Right 43 blocks, Left 36blocks - Pt requiring cues x3 to remember to only pick up one block at a time when completing with Right; requiring cues to go over barrier instead of around (possibly slowing down pace even more).  SENSATION: WFL  COGNITION: Overall cognitive status: Impaired and History of cognitive impairments - at baseline Pt able  to recall 3 words: sock blue bed after 5 mins  Pt with difficulty with word finding and memory  VISION: Subjective report: wears glasses, reported f/u with neurology and ophthalmologist who report to wait for further assessment to allow recovery s/p CVA Baseline vision: Wears glasses all the time  VISION ASSESSMENT: To be further assessed in functional context Eye alignment: WFL Ocular ROM: WFL Gaze preference/alignment: WDL Tracking/Visual pursuits: TBA Visual Fields: TBA in functional context   Bell cancellation test: locating 32/35 in 3:00, pt then able to locate remaining 2/3 in 4:20 before stating I think that is all  PERCEPTION: Impaired  PRAXIS: Impaired: Initiation, Motor planning, and Organization  OBSERVATIONS: Difficult to assess vision, attention, and strength as pt requiring  increased cueing and repeat directions.  Unsure full attention and effort during MMT this session.  Pt forgetting instructions during coordination assessment, requiring wait time for recall and/or additional cues.                                                                                                                             TREATMENT DATE:  01/08/24 Assessed goals - see goal section for details.  Pt continues to require increased time and effort for sequencing, recall, and problem solving during structured and functional tasks. Box and blocks: Logic puzzles: engaged in 5x5 puzzle to improve sequencing, deductive reasoning, memory, and recall during structured task.  OT completing puzzle with pt that she had attempted at home and was unable to solve.  OT educating on deductive reasoning as pt not explicitly given each instruction but based on some clues is able to deduce information to solve some other clues.  OT providing education on and sample situation when logic puzzle would carry over to functional situation.  Pt still demonstrating difficulty with functional situation with planning a dessert for family members based on their dietary preferences.   Sequencing: engaged in simple planning/problem solving prompt requiring pt to complete mental math to ensure ability to complete various prompts.  Pt with good mental math in increments of 10, but with increased difficulty when increasing number and increasing options.  OT educated pt on writing out steps to allow for increased organization and recall and then sequencing steps in that manner.  Educated on endurance impacting tolerance to engage in various tasks.    01/03/24 Logic puzzles: engaged in 4x4 puzzle to improve sequencing, deductive reasoning, memory and recall of unfamiliar activities.  Pt completed task with mod v/c for focusing on one written clue at a time, even breaking clues down to increase attention and success.  OT covering  extra information with finger or paper to increase focus/attention to one task at a time.  Educated on functional carryover of reducing extra stimulus for increased success.  Attempted to fade cues, with pt still benefiting from cueing to stay on task.  OT providing question cues with pt able to complete task.  Provided pt with additional puzzles to  complete at home.  Engaged in math symbol (with foods or items) activity to complete backwards chaining and deductive reasoning.  Pt demonstrating improved success with math activity, however with decreased ability to explain how she got to the answer. Cognitive/motor dual task: engaged in ambulating throughout therapy gym while completing common phrases to challenge memory, recall, and cognition.  Pt requiring increased time initially, however with improved spontaneous answers as task continued.    12/28/23 Engaged pt in functional mobility throughout busy and crowded space while maneuvering obstacles, attending to the L side, all while visually scanning and locating an item of specified color, able to perform with min verbal cues Engaged pt in dual task while walking to simulate walks with spouse and in community while engaging in conversation for sequencing and problem solving animal list going A-Z, note pt needed verbal cuing to attend to task, remember where leaving off, and at times unable to name next animal despite max cues, spelling name of animal, description, etc. Relayed to spouse to continue performing dual tasks at home and on walks.  Phone Use: engaged in using cell phone for familiar object with simple cues like call/text [name] and pt needing extended time and min verbal cues to locate icons and perform task.    PATIENT EDUCATION: Education details: see today's tx above Person educated: Patient and Spouse Education method: Explanation and Handouts Education comprehension: verbalized understanding and needs further education  HOME  EXERCISE PROGRAM: Provided written suggestions for keeping thinking skills sharp - plan to provide printout at future session 12/13/23 - memory compensation strategies and activities to keep thinking skills sharp (see pt instructions)   GOALS: Goals reviewed with patient? Yes  SHORT TERM GOALS: Target date: 12/15/23  Pt will be independent with coordination HEP with visual handouts for recall. Baseline: new to OPOT Goal status: in progress  2.  Pt will demonstrate understanding of memory compensations and ways to keep thinking skills sharp Baseline: STM deficits and impaired initiation 01/08/24 - pt utilizing writing information down,  WARM strategy, and engaging in logic puzzles with assistance from spouse for attention, sequencing, and deductive reasoning.   Goal status: MET  3.  Pt will demonstrate and/or verbalize understanding of visual attention exercises and compensatory strategies for improved visual scanning and attention. Baseline: left field cut 01/08/24 - pt and spouse report that this does not seem to be an area of concern any longer Goal status: MET, however would benefit from f/u  4.  Pt will demonstrate improved coordination, attention to task, and recall of instructions by completing box and blocks assessment with improved score by 6 blocks bilaterally. Baseline: Right 43 blocks, Left 36blocks - Pt requiring cues x3 to remember to only pick up one block at a time when completing with Right; requiring cues to go over barrier instead of around (possibly slowing down pace even more). 01/08/24: Right 41 blocks, Left 42 blocks - Pt demonstrating improved recall of instructions with only initial instruction Goal status: partially met: not met on RUE, met with LUE   LONG TERM GOALS: Target date: 01/05/24  Pt will verbalize understanding of task modifications, adaptive strategies, and/or potential AE needs to increase ease, safety, and independence w/ IADLs. Baseline: decreased  engagement in IADLs due to impaired memory and attention Goal status: NOT MET  2.  Pt will complete simulated medication management activity w/ Supervision by discharge, incorporating compensatory strategies/AE prn Baseline: spouse managing meds at this time, was not prior to CVA Goal status: NOT  MET  3.  Pt will demonstrate ability to sequence simple functional task (simple snack prep, laundry task, etc) at Mod I level with good safety awareness and ability to utilize memory strategies for recall. Baseline:  Goal status: NOT MET  4.  Pt will navigate a moderately busy environment, completing dual task activity and/or following multi-step commands with 90% accuracy Baseline:  Goal status: NOT MET  5.  Pt will demonstrate improved initiation and sequencing to be able to navigate use of personal cell phone and send text messages in <15 mins. Baseline: spouse reporting 45 mins to reply to test message Goal status: NOT MET  LONG TERM GOALS: Target date: 03/04/24  Pt will verbalize understanding of task modifications, adaptive strategies, and/or potential AE needs to increase ease, safety, and independence w/ IADLs. Baseline: decreased engagement in IADLs due to impaired memory and attention Goal status: in progress  2.  Pt will complete simulated medication management activity w/ Supervision by discharge, incorporating compensatory strategies/AE prn Baseline: spouse managing meds at this time, was not prior to CVA Goal status: in progress  3.  Pt will demonstrate ability to sequence simple functional task (simple snack prep, laundry task, etc) at Mod I level with good safety awareness and ability to utilize memory strategies for recall. Baseline:  Goal status: in progress  4.  Pt will navigate a moderately busy environment, completing dual task activity and/or following multi-step commands with 90% accuracy Baseline:  Goal status: in progress  5.  Pt will demonstrate improved  initiation and sequencing to be able to navigate use of personal cell phone and send text messages in <15 mins. Baseline: spouse reporting 45 mins to reply to test message Goal status: in progress   ASSESSMENT:  CLINICAL IMPRESSION: Pt continues to demonstrate difficulty with deductive reasoning and sequencing with logic puzzles and sequencing prompts.  Pt benefits from verbal cues and writing to facilitate increased recall, sequencing, and deducing information.  Pt is an active participant in cognitive retraining activities, dual tasks, and sequencing, however still with minimal progress with logic puzzles and examples to carry over to more functional, familiar tasks.  Pt continues to benefit from mod, question cues for attention to and engagement in cognitive (logic puzzle) tasks.  Pt and spouse receptive to education on continued dual tasking and/or incorporation of cognitive challenges during mobility tasks.  Pt demonstrating improved recall of instructions with Box and Blocks assessment, allowing pt to complete more blocks in 1 min time limit due to increased recall.  Pt will continue to benefit from additional skilled occupational therapy visits to allow for further focus on sequencing, recall, deductive reasoning with functional tasks to increase participation in IADLs.   PERFORMANCE DEFICITS: in functional skills including ADLs, IADLs, coordination, strength, Fine motor control, Gross motor control, endurance, decreased knowledge of precautions, vision, and UE functional use, cognitive skills including attention, energy/drive, memory, problem solving, safety awareness, and sequencing, and psychosocial skills including environmental adaptation and routines and behaviors.     PLAN:  OT FREQUENCY: 1-2x/week  OT DURATION: 6 weeks (asking 8 weeks for scheduling)  PLANNED INTERVENTIONS: 02831 OT Re-evaluation, 97535 self care/ADL training, 02889 therapeutic exercise, 97530 therapeutic  activity, 97112 neuromuscular re-education, functional mobility training, visual/perceptual remediation/compensation, psychosocial skills training, energy conservation, coping strategies training, patient/family education, and DME and/or AE instructions  RECOMMENDED OTHER SERVICES: SLP  CONSULTED AND AGREED WITH PLAN OF CARE: Patient and family member/caregiver  PLAN FOR NEXT SESSION:   Cognitive/motor dual tasking, sequencing, recall of  2-3 step directions  Phone use  Further assess during functional tasks, review PRN visual compensatory strategies (especially during functional tasks)  Visual scanning tasks  Sequencing tasks    KAYLENE DOMINO, OTR/L 01/08/2024, 3:06 PM  [facility] [address] [place], [place] [address] Phone # [phone] Fax # [phone]

## 2024-01-09 ENCOUNTER — Ambulatory Visit: Admitting: Psychology

## 2024-01-09 DIAGNOSIS — I6340 Cerebral infarction due to embolism of unspecified cerebral artery: Secondary | ICD-10-CM

## 2024-01-09 DIAGNOSIS — F0670 Mild neurocognitive disorder due to known physiological condition without behavioral disturbance: Secondary | ICD-10-CM

## 2024-01-09 NOTE — Progress Notes (Signed)
   Neuropsychology Feedback Session Jolynn DEL. [facility] [place] Department of Neurology  Reason for Referral:   Gabrielle Vargas is a 73 y.o. right-handed Caucasian female referred by Camie Sevin, PA-C, to characterize her current cognitive functioning and assist with diagnostic clarity and treatment planning in the context of ADHD, a prior mild neurocognitive disorder diagnosis, recent stroke, and concern for progressive cognitive decline.   Feedback:   Gabrielle Vargas completed a comprehensive neuropsychological evaluation on 12/19/2023. Please refer to that encounter for the full report and recommendations. Briefly, results suggested severe impairment surrounding expressive language. Additional impairment was exhibited across processing speed and executive functioning. While she was unable to spontaneously recall a previously learned list of words after a delay, she benefited from cueing and was able to demonstrate appropriate recollection across two other memory tasks. Performances were also appropriate relative to age-matched peers across attention/concentration, visuospatial abilities, and encoding (i.e.., learning) aspects of memory. Relative to her previous evaluation in January 2024, noteworthy decline was exhibited across executive functioning (namely cognitive flexibility and response inhibition), expressive language (namely phonemic fluency and confrontation naming), and delayed retrieval across verbal memory tasks. More mild decline could be argued across processing speed, semantic fluency, and encoding (i.e., learning) aspects of verbal memory. The etiology for ongoing dysfunction is multifactorial in nature. 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 Despite parietal lobe involvement, visuospatial abilities remained stable and appropriate. However, performance declines  surrounding processing speed and especially executive functioning, make conceptual sense given this event and its location(s). Her stroke experience could certainly exacerbate her baseline history of ADHD, creating a mixed presentation. Following her 2024 evaluation, there was some concern expressed for the very nascent beginnings of a primary progressive aphasia presentation. This was based upon word finding being her most salient cognitive concern and testing exhibiting quite significant expressive language dysfunction. However, testing patterns surrounding language in general were variable and she did exhibit several areas of strength back in 2024. Current decline surrounding expressive language is concerning, especially as there did not appear to be direct temporal lobe involvement with her recent embolic stroke. However, given the recency of her stroke and the likelihood of cognitive improvement over the next 6-9 months, firm conclusions surrounding the risk of an underlying and progressive illness in addition to stroke/ADHD concerns cannot be made.   Gabrielle Vargas was accompanied by her husband during the current feedback session. Content of the current session focused on the results of her neuropsychological evaluation. Gabrielle Vargas was given the opportunity to ask questions and her questions were answered. She was encouraged to reach out should additional questions arise. A copy of her report was provided at the conclusion of the visit.      One unit [id number] (31 minutes) was billed for Dr. Loralee time spent preparing for, conducting, and documenting the current feedback session with Gabrielle Vargas.

## 2024-01-10 ENCOUNTER — Ambulatory Visit: Admitting: Occupational Therapy

## 2024-01-10 DIAGNOSIS — R41840 Attention and concentration deficit: Secondary | ICD-10-CM

## 2024-01-10 DIAGNOSIS — I69318 Other symptoms and signs involving cognitive functions following cerebral infarction: Secondary | ICD-10-CM

## 2024-01-10 NOTE — Therapy (Signed)
 OUTPATIENT OCCUPATIONAL THERAPY NEURO Treatment  [name] MRN: [id number] DOB:12/14/1950, 73 y.o., female Today's Date: 01/11/2024  PCP: Merna Huxley, NP REFERRING PROVIDER: Carilyn Prentice BRAVO, MD  END OF SESSION:  OT End of Session - 01/10/24 1725     Visit Number 9    Number of Visits 16    Date for OT Re-Evaluation 03/04/24    Authorization Type [employer]    OT Start Time 1104    OT Stop Time 1146    OT Time Calculation (min) 42 min    Activity Tolerance Patient tolerated treatment well    Behavior During Therapy WFL for tasks assessed/performed                  Past Medical History:  Diagnosis Date   Acute combined systolic and diastolic heart failure 10/11/2023   ADHD (attention deficit hyperactivity disorder) 05/09/2007   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; multiple punctate foci of restricted diffusion in bilateral frontal lobes and the left parietal lobe   Diverticulitis of colon (without mention of hemorrhage) 09/17/2013   Essential hypertension 10/09/2017   HFrEF (heart failure with reduced ejection fraction) 10/10/2023   History of COVID-19 2021   Hyperlipidemia    Hypothyroidism    Low back pain radiating to right leg 10/09/2017   Mild cognitive impairment of uncertain or unknown etiology 08/04/2022   Mild intermittent asthma with acute exacerbation 05/30/2017   Mild neurocognitive disorder due to multiple etiologies 12/19/2023   NSVT (nonsustained ventricular tachycardia) 10/12/2023   Osteoarthritis    Postmenopausal atrophic vaginitis, severe 08/18/2008   Right knee pain 03/13/2014   S/P arthroscopy of right knee 06/03/2014   Vitamin B12 deficiency    Past Surgical History:  Procedure Laterality Date   ABDOMINAL HYSTERECTOMY     BREAST SURGERY     CARPAL TUNNEL RELEASE Bilateral    CERVICAL DISC ARTHROPLASTY     CESAREAN SECTION     x2   left shoulder surgery     LOOP  RECORDER INSERTION N/A 10/12/2023   Procedure: LOOP RECORDER INSERTION;  Surgeon: Cindie Ole DASEN, MD;  Location: [facility];  Service: Cardiovascular;  Laterality: N/A;   MENISCUS REPAIR Right 2017   REDUCTION MAMMAPLASTY Bilateral    right shoulder     TONSILLECTOMY     Patient Active Problem List   Diagnosis Date Noted   Mild neurocognitive disorder due to multiple etiologies 12/19/2023   NSVT (nonsustained ventricular tachycardia) 10/12/2023   Acute combined systolic and diastolic heart failure 10/11/2023   HFrEF (heart failure with reduced ejection fraction) 10/10/2023   CVA (cerebral vascular accident) 10/08/2023   Mild cognitive impairment of uncertain or unknown etiology 08/04/2022   Vitamin B12 deficiency    Osteoarthritis 08/03/2022   Hyperlipidemia 08/03/2022   Essential hypertension 10/09/2017   Low back pain radiating to right leg 10/09/2017   Mild intermittent asthma with acute exacerbation 05/30/2017   S/P arthroscopy of right knee 06/03/2014   Right knee pain 03/13/2014   Diverticulitis of colon (without mention of hemorrhage) 09/17/2013   Postmenopausal atrophic vaginitis, severe 08/18/2008   Hypothyroidism 05/09/2007   ADHD (attention deficit hyperactivity disorder) 05/09/2007    ONSET DATE: 10/08/23  REFERRING DIAG: I63.9 (ICD-10-CM) - Cerebral infarction, unspecified  THERAPY DIAG:  Attention and concentration deficit  Other symptoms and signs involving cognitive functions following cerebral infarction  Rationale for Evaluation and Treatment: Rehabilitation  SUBJECTIVE:   SUBJECTIVE STATEMENT: You are  going to be mad at me. Because I had a hard time with the sequencing homework.  Pt accompanied by: self and spouse  PERTINENT HISTORY: ADHD, HTN, diverticulitis, HLD, hypothyroidism, low back pain, neck fusion, mild cognitive impairment, OA in R knee, asthma   presented to [facility] ED on 10/08/2023 with sudden onset of left facial droop,  left-sided weakness,and L visual cut. MRI: Multiple punctate acute infarcts bilateral frontal and left parietal lobes. EEG suggestive of moderate diffuse encephalopathy.   PRECAUTIONS: Fall  WEIGHT BEARING RESTRICTIONS: No  PAIN:  Are you having pain? No pain  FALLS: Has patient fallen in last 6 months? No  LIVING ENVIRONMENT: Lives with: lives with their spouse Lives in: House/apartment Stairs: Yes: Internal: bedroom/bathroom on main floor, does have a full flight of steps - spouse's office is upstairs  steps; one rail and External: 2 steps;   Has following equipment at home: shower chair and hand held shower head  PLOF: Independent, Independent with basic ADLs, and Leisure: walking up to 4-6 hours per day  PATIENT GOALS: I really don't know  OBJECTIVE:  Note: Objective measures were completed at Evaluation unless otherwise noted.  HAND DOMINANCE: Right  ADLs: Overall ADLs: Mod I with bathing and dressing, pt's spouse will go in and out of bathroom while pt in shower providing distant supervision to Mod I level Transfers/ambulation related to ADLs: Mod I without AD, getting into high bed without any issue  IADLs: Shopping: pt gets overwhelmed with everything that is going on, easily distracted  Meal Prep: spouse is doing most of the cooking right now as pt forgetful with sequencing and ingredients, and easily distracted Community mobility: not cleared to drive at this time Medication management: spouse is assisting now, he will fill up a daily pill box for pt   MOBILITY STATUS: Mod I without AD  POSTURE COMMENTS:  No Significant postural limitations  ACTIVITY TOLERANCE: Activity tolerance: pt reports worn out after 30 min walk (where as they would walk up to 2 hrs at a time before stroke).  Pt is not sleeping as much during the day time.  FUNCTIONAL OUTCOME MEASURES: 9 hole peg test: Right: 36.54 sec; Left: 34.06 sec Trail Making: 42.47 sec for Trail A   UPPER  EXTREMITY ROM:  WFL bilaterally  UPPER EXTREMITY MMT:     MMT Right eval Left eval  Shoulder flexion 4+ 4-  Shoulder abduction    Shoulder adduction    Shoulder extension    Shoulder internal rotation    Shoulder external rotation    Middle trapezius    Lower trapezius    Elbow flexion 4 4  Elbow extension 4- 4-  Wrist flexion    Wrist extension    Wrist ulnar deviation    Wrist radial deviation    Wrist pronation    Wrist supination    (Blank rows = not tested)  COORDINATION: 9 Hole Peg test: Right: 36.54 sec; Left: 34.06 sec Box and Blocks:  Right 43 blocks, Left 36blocks - Pt requiring cues x3 to remember to only pick up one block at a time when completing with Right; requiring cues to go over barrier instead of around (possibly slowing down pace even more).  SENSATION: WFL  COGNITION: Overall cognitive status: Impaired and History of cognitive impairments - at baseline Pt able to recall 3 words: sock blue bed after 5 mins  Pt with difficulty with word finding and memory  VISION: Subjective report: wears glasses, reported f/u with neurology  and ophthalmologist who report to wait for further assessment to allow recovery s/p CVA Baseline vision: Wears glasses all the time  VISION ASSESSMENT: To be further assessed in functional context Eye alignment: WFL Ocular ROM: WFL Gaze preference/alignment: WDL Tracking/Visual pursuits: TBA Visual Fields: TBA in functional context   Bell cancellation test: locating 32/35 in 3:00, pt then able to locate remaining 2/3 in 4:20 before stating I think that is all  PERCEPTION: Impaired  PRAXIS: Impaired: Initiation, Motor planning, and Organization  OBSERVATIONS: Difficult to assess vision, attention, and strength as pt requiring increased cueing and repeat directions.  Unsure full attention and effort during MMT this session.  Pt forgetting instructions during coordination assessment, requiring wait time for recall and/or  additional cues.                                                                                                                             TREATMENT DATE:  01/10/24 Logic puzzles: engaged in sequencing and problem solving organization of daily task prompt.  Pt demonstrating improved ease with more simple prompts compared to more complex prompts.  OT breaking down prompt to facilitate increased ease and sequencing, including listing tasks and having pt place numbers to organize.  OT utilizing who, what, when, where, why prompts to answer some complex problem solving questions.  OT also educating on energy conservation tasks to break up tasks across days to increase success if multiple tasks.  OT educated on 4P's of energy conservation (planning, prioritizing, pacing, and positioning) and providing cues and examples for each.   Pill box assessment.  Pt completed with 100% accuracy in 6:17.  Pt with no errors, however requiring increased time for organization and sequencing.  PT demonstrating good technique by flipping over pill bottles after dispensing to increase recall.  OT reiterating use of recall/memory strategies to increase recall, recommending use of checks and balances.  OT encouraged pt to take a more active roll in filling out pill box at home as well as other familiar tasks, with supervision, to continue to address organization, sequencing, and recall.      01/08/24 Assessed goals - see goal section for details.  Pt continues to require increased time and effort for sequencing, recall, and problem solving during structured and functional tasks. Box and blocks: Right 41 blocks, Left 42 blocks - Pt demonstrating improved recall of instructions with only initial instruction Logic puzzles: engaged in 5x5 puzzle to improve sequencing, deductive reasoning, memory, and recall during structured task.  OT completing puzzle with pt that she had attempted at home and was unable to solve.  OT educating  on deductive reasoning as pt not explicitly given each instruction but based on some clues is able to deduce information to solve some other clues.  OT providing education on and sample situation when logic puzzle would carry over to functional situation.  Pt still demonstrating difficulty with functional situation with planning a dessert for family members based  on their dietary preferences.   Sequencing: engaged in simple planning/problem solving prompt requiring pt to complete mental math to ensure ability to complete various prompts.  Pt with good mental math in increments of 10, but with increased difficulty when increasing number and increasing options.  OT educated pt on writing out steps to allow for increased organization and recall and then sequencing steps in that manner.  Educated on endurance impacting tolerance to engage in various tasks.    01/03/24 Logic puzzles: engaged in 4x4 puzzle to improve sequencing, deductive reasoning, memory and recall of unfamiliar activities.  Pt completed task with mod v/c for focusing on one written clue at a time, even breaking clues down to increase attention and success.  OT covering extra information with finger or paper to increase focus/attention to one task at a time.  Educated on functional carryover of reducing extra stimulus for increased success.  Attempted to fade cues, with pt still benefiting from cueing to stay on task.  OT providing question cues with pt able to complete task.  Provided pt with additional puzzles to complete at home.  Engaged in math symbol (with foods or items) activity to complete backwards chaining and deductive reasoning.  Pt demonstrating improved success with math activity, however with decreased ability to explain how she got to the answer. Cognitive/motor dual task: engaged in ambulating throughout therapy gym while completing common phrases to challenge memory, recall, and cognition.  Pt requiring increased time  initially, however with improved spontaneous answers as task continued.   PATIENT EDUCATION: Education details: see today's tx above Person educated: Patient and Spouse Education method: Explanation and Handouts Education comprehension: verbalized understanding and needs further education  HOME EXERCISE PROGRAM: Provided written suggestions for keeping thinking skills sharp - plan to provide printout at future session 12/13/23 - memory compensation strategies and activities to keep thinking skills sharp (see pt instructions)   GOALS: Goals reviewed with patient? Yes  SHORT TERM GOALS: Target date: 12/15/23  Pt will be independent with coordination HEP with visual handouts for recall. Baseline: new to OPOT Goal status: in progress  2.  Pt will demonstrate understanding of memory compensations and ways to keep thinking skills sharp Baseline: STM deficits and impaired initiation 01/08/24 - pt utilizing writing information down,  WARM strategy, and engaging in logic puzzles with assistance from spouse for attention, sequencing, and deductive reasoning.   Goal status: MET  3.  Pt will demonstrate and/or verbalize understanding of visual attention exercises and compensatory strategies for improved visual scanning and attention. Baseline: left field cut 01/08/24 - pt and spouse report that this does not seem to be an area of concern any longer Goal status: MET, however would benefit from f/u  4.  Pt will demonstrate improved coordination, attention to task, and recall of instructions by completing box and blocks assessment with improved score by 6 blocks bilaterally. Baseline: Right 43 blocks, Left 36blocks - Pt requiring cues x3 to remember to only pick up one block at a time when completing with Right; requiring cues to go over barrier instead of around (possibly slowing down pace even more). 01/08/24: Right 41 blocks, Left 42 blocks - Pt demonstrating improved recall of instructions with  only initial instruction Goal status: partially met: not met on RUE, met with LUE    LONG TERM GOALS: Target date: 03/04/24  Pt will verbalize understanding of task modifications, adaptive strategies, and/or potential AE needs to increase ease, safety, and independence w/ IADLs. Baseline: decreased engagement  in IADLs due to impaired memory and attention Goal status: in progress  2.  Pt will complete simulated medication management activity w/ Supervision by discharge, incorporating compensatory strategies/AE prn Baseline: spouse managing meds at this time, was not prior to CVA Goal status: in progress  3.  Pt will demonstrate ability to sequence simple functional task (simple snack prep, laundry task, etc) at Mod I level with good safety awareness and ability to utilize memory strategies for recall. Baseline:  Goal status: in progress  4.  Pt will navigate a moderately busy environment, completing dual task activity and/or following multi-step commands with 90% accuracy Baseline:  Goal status: in progress  5.  Pt will demonstrate improved initiation and sequencing to be able to navigate use of personal cell phone and send text messages in <15 mins. Baseline: spouse reporting 45 mins to reply to test message Goal status: in progress   ASSESSMENT:  CLINICAL IMPRESSION: Pt continues to demonstrate difficulty with deductive reasoning and sequencing with logic puzzles and sequencing prompts.  Pt benefits from verbal cues and writing to facilitate increased recall, sequencing, and deducing information.  Pt is an active participant in cognitive retraining activities and sequencing, benefiting from more functional prompts and utilizing listing to increase recall and improve sequencing.  Pt and spouse receptive to education on continued dual tasking and/or incorporation of cognitive challenges into familiar, routine tasks to continue to focus on sequencing and problem solving. Pt will continue  to benefit from additional skilled occupational therapy visits to allow for further focus on sequencing, recall, deductive reasoning with functional tasks to increase participation in IADLs.   PERFORMANCE DEFICITS: in functional skills including ADLs, IADLs, coordination, strength, Fine motor control, Gross motor control, endurance, decreased knowledge of precautions, vision, and UE functional use, cognitive skills including attention, energy/drive, memory, problem solving, safety awareness, and sequencing, and psychosocial skills including environmental adaptation and routines and behaviors.     PLAN:  OT FREQUENCY: 1-2x/week  OT DURATION: 6 weeks (asking 8 weeks for scheduling)  PLANNED INTERVENTIONS: 02831 OT Re-evaluation, 97535 self care/ADL training, 02889 therapeutic exercise, 97530 therapeutic activity, 97112 neuromuscular re-education, functional mobility training, visual/perceptual remediation/compensation, psychosocial skills training, energy conservation, coping strategies training, patient/family education, and DME and/or AE instructions  RECOMMENDED OTHER SERVICES: SLP  CONSULTED AND AGREED WITH PLAN OF CARE: Patient and family member/caregiver  PLAN FOR NEXT SESSION:   Cognitive/motor dual tasking, sequencing, recall of 2-3 step directions  Phone use  Further assess during functional tasks, review PRN visual compensatory strategies (especially during functional tasks)  Visual scanning tasks  Sequencing tasks    KAYLENE DOMINO, OTR/L 01/11/2024, 7:25 AM  [facility] at [facility] [address], [address] [place], [place] [address] Phone # [phone] Fax # [phone]

## 2024-01-15 ENCOUNTER — Ambulatory Visit (HOSPITAL_COMMUNITY)
Admission: RE | Admit: 2024-01-15 | Discharge: 2024-01-15 | Disposition: A | Discharge status: Home / Self Care | Source: Ambulatory Visit | Attending: Physician Assistant | Admitting: Physician Assistant

## 2024-01-15 ENCOUNTER — Ambulatory Visit: Payer: Self-pay | Admitting: Physician Assistant

## 2024-01-15 ENCOUNTER — Other Ambulatory Visit (HOSPITAL_COMMUNITY)

## 2024-01-15 DIAGNOSIS — I10 Essential (primary) hypertension: Secondary | ICD-10-CM

## 2024-01-15 DIAGNOSIS — R040 Epistaxis: Secondary | ICD-10-CM

## 2024-01-15 DIAGNOSIS — R0989 Other specified symptoms and signs involving the circulatory and respiratory systems: Secondary | ICD-10-CM

## 2024-01-15 DIAGNOSIS — E785 Hyperlipidemia, unspecified: Secondary | ICD-10-CM

## 2024-01-15 DIAGNOSIS — I639 Cerebral infarction, unspecified: Secondary | ICD-10-CM | POA: Diagnosis not present

## 2024-01-15 DIAGNOSIS — I5022 Chronic systolic (congestive) heart failure: Secondary | ICD-10-CM

## 2024-01-15 DIAGNOSIS — I38 Endocarditis, valve unspecified: Secondary | ICD-10-CM | POA: Diagnosis not present

## 2024-01-15 LAB — ECHOCARDIOGRAM COMPLETE
Area-P 1/2: 2.2 cm2
S' Lateral: 2.8 cm

## 2024-01-22 ENCOUNTER — Ambulatory Visit: Admitting: Neurology

## 2024-01-22 ENCOUNTER — Encounter: Payer: Self-pay | Admitting: Neurology

## 2024-01-22 ENCOUNTER — Ambulatory Visit

## 2024-01-22 VITALS — BP 110/68 | HR 71 | Ht 59.0 in | Wt 109.0 lb

## 2024-01-22 DIAGNOSIS — I639 Cerebral infarction, unspecified: Secondary | ICD-10-CM

## 2024-01-22 DIAGNOSIS — G3184 Mild cognitive impairment, so stated: Secondary | ICD-10-CM

## 2024-01-22 DIAGNOSIS — R413 Other amnesia: Secondary | ICD-10-CM | POA: Diagnosis not present

## 2024-01-22 LAB — CUP PACEART REMOTE DEVICE CHECK
Date Time Interrogation Session: 20250706235417
Implantable Pulse Generator Implant Date: 20250327

## 2024-01-22 MED ORDER — ATORVASTATIN CALCIUM 40 MG PO TABS: 20.0000 mg | ORAL_TABLET | Freq: Every day | ORAL | 3 refills | Status: AC

## 2024-01-22 NOTE — Patient Instructions (Addendum)
 I had a long d/w patient and her husband about her recent cryptogenic stroke, mild cognitive impairment and memory loss risk for recurrent stroke/TIAs, personally independently reviewed imaging studies and stroke evaluation results and answered questions.Continue aspirin  81 mg daily  for secondary stroke prevention and maintain strict control of hypertension with blood pressure goal below 130/90, diabetes with hemoglobin A1c goal below 6.5% and lipids with LDL cholesterol goal below 70 mg/dL. I also advised the patient to eat a healthy diet with plenty of whole grains, cereals, fruits and vegetables, exercise regularly and maintain ideal body weight .  Patient is worrying about her hair loss which she feels may be related to Lipitor.  I recommend she consider reducing the dose to 20 mg daily and if it persists consider switching to alternative like Zetia  or bempedoic acid after discussion with primary care physician..I encouraged her to increase participation in cognitively challenging activities like solving crossword puzzles, playing bridge and sudoku.  She may also try health supplements like Cerefolin or Prevagen if interested.  We also discussed memory compensation strategies.  Followup in the future with me in 6 months or call earlier if necessary.  Memory Compensation Strategies  Use WARM strategy.  W= write it down  A= associate it  R= repeat it  M= make a mental note  2.   You can keep a Glass blower/designer.  Use a 3-ring notebook with sections for the following: calendar, important names and phone numbers,  medications, doctors' names/phone numbers, lists/reminders, and a section to journal what you did  each day.   3.    Use a calendar to write appointments down.  4.    Write yourself a schedule for the day.  This can be placed on the calendar or in a separate section of the Memory Notebook.  Keeping a  regular schedule can help memory.  5.    Use medication organizer with sections for  each day or morning/evening pills.  You may need help loading it  6.    Keep a basket, or pegboard by the door.  Place items that you need to take out with you in the basket or on the pegboard.  You may also want to  include a message board for reminders.  7.    Use sticky notes.  Place sticky notes with reminders in a place where the task is performed.  For example:  turn off the  stove placed by the stove, lock the door placed on the door at eye level,  take your medications on  the bathroom mirror or by the place where you normally take your medications.  8.    Use alarms/timers.  Use while cooking to remind yourself to check on food or as a reminder to take your medicine, or as a  reminder to make a call, or as a reminder to perform another task, etc.  Stroke Prevention Some medical conditions and behaviors can lead to a higher chance of having a stroke. You can help prevent a stroke by eating healthy, exercising, not smoking, and managing any medical conditions you have. Stroke is a leading cause of functional impairment. Primary prevention is particularly important because a majority of strokes are first-time events. Stroke changes the lives of not only those who experience a stroke but also their family and other caregivers. How can this condition affect me? A stroke is a medical emergency and should be treated right away. A stroke can lead to brain damage and can  sometimes be life-threatening. If a person gets medical treatment right away, there is a better chance of surviving and recovering from a stroke. What can increase my risk? The following medical conditions may increase your risk of a stroke: Cardiovascular disease. High blood pressure (hypertension). Diabetes. High cholesterol. Sickle cell disease. Blood clotting disorders (hypercoagulable state). Obesity. Sleep disorders (obstructive sleep apnea). Other risk factors include: Being older than age 68. Having a  history of blood clots, stroke, or mini-stroke (transient ischemic attack, TIA). Genetic factors, such as race, ethnicity, or a family history of stroke. Smoking cigarettes or using other tobacco products. Taking birth control pills, especially if you also use tobacco. Heavy use of alcohol or drugs, especially cocaine and methamphetamine. Physical inactivity. What actions can I take to prevent this? Manage your health conditions High cholesterol levels. Eating a healthy diet is important for preventing high cholesterol. If cholesterol cannot be managed through diet alone, you may need to take medicines. Take any prescribed medicines to control your cholesterol as told by your health care provider. Hypertension. To reduce your risk of stroke, try to keep your blood pressure below 130/80. Eating a healthy diet and exercising regularly are important for controlling blood pressure. If these steps are not enough to manage your blood pressure, you may need to take medicines. Take any prescribed medicines to control hypertension as told by your health care provider. Ask your health care provider if you should monitor your blood pressure at home. Have your blood pressure checked every year, even if your blood pressure is normal. Blood pressure increases with age and some medical conditions. Diabetes. Eating a healthy diet and exercising regularly are important parts of managing your blood sugar (glucose). If your blood sugar cannot be managed through diet and exercise, you may need to take medicines. Take any prescribed medicines to control your diabetes as told by your health care provider. Get evaluated for obstructive sleep apnea. Talk to your health care provider about getting a sleep evaluation if you snore a lot or have excessive sleepiness. Make sure that any other medical conditions you have, such as atrial fibrillation or atherosclerosis, are managed. Nutrition Follow instructions from your  health care provider about what to eat or drink to help manage your health condition. These instructions may include: Reducing your daily calorie intake. Limiting how much salt (sodium) you use to 1,500 milligrams (mg) each day. Using only healthy fats for cooking, such as olive oil, canola oil, or sunflower oil. Eating healthy foods. You can do this by: Choosing foods that are high in fiber, such as whole grains, and fresh fruits and vegetables. Eating at least 5 servings of fruits and vegetables a day. Try to fill one-half of your plate with fruits and vegetables at each meal. Choosing lean protein foods, such as lean cuts of meat, poultry without skin, fish, tofu, beans, and nuts. Eating low-fat dairy products. Avoiding foods that are high in sodium. This can help lower blood pressure. Avoiding foods that have saturated fat, trans fat, and cholesterol. This can help prevent high cholesterol. Avoiding processed and prepared foods. Counting your daily carbohydrate intake.  Lifestyle If you drink alcohol: Limit how much you have to: 0-1 drink a day for women who are not pregnant. 0-2 drinks a day for men. Know how much alcohol is in your drink. In the [place] one drink equals one 12 oz bottle of beer ( ), one 5 oz glass of wine ( ), or one 1 oz glass of hard liquor (  44mL). Do not use any products that contain nicotine or tobacco. These products include cigarettes, chewing tobacco, and vaping devices, such as e-cigarettes. If you need help quitting, ask your health care provider. Avoid secondhand smoke. Do not use drugs. Activity  Try to stay at a healthy weight. Get at least 30 minutes of exercise on most days, such as: Fast walking. Biking. Swimming. Medicines Take over-the-counter and prescription medicines only as told by your health care provider. Aspirin  or blood thinners (antiplatelets or anticoagulants) may be recommended to reduce your risk of forming blood clots that  can lead to stroke. Avoid taking birth control pills. Talk to your health care provider about the risks of taking birth control pills if: You are over 51 years old. You smoke. You get very bad headaches. You have had a blood clot. Where to find more information American Stroke Association: www.strokeassociation.org Get help right away if: You or a loved one has any symptoms of a stroke. BE FAST is an easy way to remember the main warning signs of a stroke: B - Balance. Signs are dizziness, sudden trouble walking, or loss of balance. E - Eyes. Signs are trouble seeing or a sudden change in vision. F - Face. Signs are sudden weakness or numbness of the face, or the face or eyelid drooping on one side. A - Arms. Signs are weakness or numbness in an arm. This happens suddenly and usually on one side of the body. S - Speech. Signs are sudden trouble speaking, slurred speech, or trouble understanding what people say. T - Time. Time to call emergency services. Write down what time symptoms started. You or a loved one has other signs of a stroke, such as: A sudden, severe headache with no known cause. Nausea or vomiting. Seizure. These symptoms may represent a serious problem that is an emergency. Do not wait to see if the symptoms will go away. Get medical help right away. Call your local emergency services (911 in the [place] Do not drive yourself to the hospital. Summary You can help to prevent a stroke by eating healthy, exercising, not smoking, limiting alcohol intake, and managing any medical conditions you may have. Do not use any products that contain nicotine or tobacco. These include cigarettes, chewing tobacco, and vaping devices, such as e-cigarettes. If you need help quitting, ask your health care provider. Remember BE FAST for warning signs of a stroke. Get help right away if you or a loved one has any of these signs. This information is not intended to replace advice given to you  by your health care provider. Make sure you discuss any questions you have with your health care provider. Document Revised: 06/06/2022 Document Reviewed: 06/06/2022 Elsevier Patient Education  2024 [employer].

## 2024-01-22 NOTE — Progress Notes (Signed)
 [facility] [address] [place]. [place] [address] [phone]       OFFICE FOLLOW-UP NOTE  [name] Date of Birth:  May 26, 1951 Medical Record Number:  [id number]   HPI: Gabrielle Vargas is a pleasant 73 year old Caucasian lady seen today for initial office follow-up visit following hospital admission for stroke and March 2025.  She is accompanied by her husband.  History is obtained from them and review of electronic medical records and I personally reviewed pertinent available imaging films in PACS.  She has past medical history of hypertension, hyperlipidemia, hypothyroidism, ADHD, low back pain, cervical neck fusion, mild cognitive impairment, asthma, osteoarthritis and B12 deficiency.  She presented on 10/08/2023 from home to the emergency room via EMS as a code stroke for acute onset left-sided weakness and speech difficulties.  She presented within time window for thrombolysis and CT head on presentation showed no acute abnormality.  Neurological exam suggested left facial droop, speech difficulties, left arm and leg weakness and left-sided visual field cut with NIH stroke scale of 8.  She was given IV TNK.  CT angiogram showed no LVO and 1 mm aneurysm or infundibulum at the right P-comm origin.  MRI scan showed multiple punctate tiny acute infarcts involving bilateral frontal and left parietal lobes.  2D echo showed ejection fraction of 30 to 35% with grade 1 diastolic dysfunction.  LDL cholesterol is 80 mg percent.  Hemoglobin A1c of 6.2.  Cardiology was consulted and patient had a loop recorder inserted but so far paroxysmal A-fib not yet been found.  She was discharged on dual antiplatelet therapy for 3 weeks and then aspirin  alone.  Patient states she is doing well.  She is still participating in outpatient speech therapy.  She is able to ambulate independently without assistance.  She is tolerating aspirin  well with minor bruising and no bleeding.  She states  her blood pressure is under good control.  She had an outpatient repeat echocardiogram on 01/15/2024 which showed improved ejection fraction of 55 to 60%.  Carotid ultrasound showed no significant extracranial stenosis.  Last loop recorder interrogation on 12/18/2023 showed no evidence of paroxysmal A-fib yet.  She continues to have mild short-term memory and cognitive difficulties which are not significantly worse than before her stroke.  She has no new complaints today.  ROS:   14 system review of systems is positive for weakness, speech difficulty, back pain, neck pain, bruising all other systems negative  PMH:  Past Medical History:  Diagnosis Date   Acute combined systolic and diastolic heart failure 10/11/2023   ADHD (attention deficit hyperactivity disorder) 05/09/2007   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; multiple punctate foci of restricted diffusion in bilateral frontal lobes and the left parietal lobe   Diverticulitis of colon (without mention of hemorrhage) 09/17/2013   Essential hypertension 10/09/2017   HFrEF (heart failure with reduced ejection fraction) 10/10/2023   History of COVID-19 2021   Hyperlipidemia    Hypothyroidism    Low back pain radiating to right leg 10/09/2017   Mild cognitive impairment of uncertain or unknown etiology 08/04/2022   Mild intermittent asthma with acute exacerbation 05/30/2017   Mild neurocognitive disorder due to multiple etiologies 12/19/2023   NSVT (nonsustained ventricular tachycardia) 10/12/2023   Osteoarthritis    Postmenopausal atrophic vaginitis, severe 08/18/2008   Right knee pain 03/13/2014   S/P arthroscopy of right knee 06/03/2014   Vitamin B12 deficiency     Social History:  Social History  Socioeconomic History   Marital status: Married    Spouse name: Not on file   Number of children: 2   Years of education: 4   Highest education level: Some college, no degree  Occupational History    Occupation: Retired  Tobacco Use   Smoking status: Never   Smokeless tobacco: Never  Vaping Use   Vaping status: Never Used  Substance and Sexual Activity   Alcohol use: No   Drug use: No   Sexual activity: Not on file  Other Topics Concern   Not on file  Social History Narrative   Retired   Married   2 children   Right handed   Live with husband   Social Drivers of Corporate investment banker Strain: Low Risk  (12/23/2022)   Overall Financial Resource Strain (CARDIA)    Difficulty of Paying Living Expenses: Not hard at all  Food Insecurity: Patient Unable To Answer (10/09/2023)   Hunger Vital Sign    Worried About Running Out of Food in the Last Year: Patient unable to answer    Ran Out of Food in the Last Year: Patient unable to answer  Transportation Needs: Patient Unable To Answer (10/09/2023)   PRAPARE - Transportation    Lack of Transportation (Medical): Patient unable to answer    Lack of Transportation (Non-Medical): Patient unable to answer  Physical Activity: Sufficiently Active (12/23/2022)   Exercise Vital Sign    Days of Exercise per Week: 7 days    Minutes of Exercise per Session: 60 min  Stress: No Stress Concern Present (12/23/2022)   [employer] of Occupational Health - Occupational Stress Questionnaire    Feeling of Stress : Not at all  Social Connections: Patient Unable To Answer (10/09/2023)   Social Connection and Isolation Panel    Frequency of Communication with Friends and Family: Patient unable to answer    Frequency of Social Gatherings with Friends and Family: Patient unable to answer    Attends Religious Services: Patient unable to answer    Active Member of Clubs or Organizations: Patient unable to answer    Attends Banker Meetings: Patient unable to answer    Marital Status: Patient unable to answer  Intimate Partner Violence: Patient Unable To Answer (10/09/2023)   Humiliation, Afraid, Rape, and Kick questionnaire    Fear of  Current or Ex-Partner: Patient unable to answer    Emotionally Abused: Patient unable to answer    Physically Abused: Patient unable to answer    Sexually Abused: Patient unable to answer    Medications:   Current Outpatient Medications on File Prior to Visit  Medication Sig Dispense Refill   Acetylcysteine (NAC) 500 MG CAPS Take 500 mg by mouth daily.     amphetamine -dextroamphetamine  (ADDERALL) 10 MG tablet Take 1 tablet (10 mg total) by mouth daily with breakfast. This dose is decreased as before admission. 30 tablet 0   amphetamine -dextroamphetamine  (ADDERALL) 10 MG tablet Take 1 tablet (10 mg total) by mouth daily with breakfast. This dose is decreased as before admission. 30 tablet 0   amphetamine -dextroamphetamine  (ADDERALL) 10 MG tablet Take 1 tablet (10 mg total) by mouth daily with breakfast. This dose is decreased as before admission. 30 tablet 0   Ascorbic Acid (VITAMIN C) 1000 MG tablet Take 1,000 mg by mouth daily.     aspirin  EC 81 MG tablet Take 81 mg by mouth daily. Swallow whole.     atorvastatin  (LIPITOR) 40 MG tablet Take 1 tablet (  40 mg total) by mouth daily. 90 tablet 3   Bioflavonoid Products (QUERCETIN COMPLEX IMMUNE PO) Take 500 mg by mouth daily.     Cholecalciferol (D3-50 PO) Take 1 capsule by mouth daily.     Evening Primrose Oil 1000 MG CAPS Take 1,000 mg by mouth in the morning and at bedtime.     ezetimibe  (ZETIA ) 10 MG tablet Take 1 tablet (10 mg total) by mouth at bedtime. 90 tablet 3   hydrochlorothiazide  (HYDRODIURIL ) 25 MG tablet TAKE 1 TABLET BY MOUTH DAILY 90 tablet 1   losartan  (COZAAR ) 25 MG tablet Take 0.5 tablets (12.5 mg total) by mouth daily. 45 tablet 3   metoprolol  succinate (TOPROL -XL) 25 MG 24 hr tablet Take 1 tablet (25 mg total) by mouth daily. 90 tablet 3   SYNTHROID  75 MCG tablet Take 1 tablet (75 mcg total) by mouth daily. 90 tablet 3   vitamin B-12 (CYANOCOBALAMIN ) 1000 MCG tablet Take 1,000 mcg by mouth 2 (two) times a week.     Zinc 20  MG CAPS Take 20 mg by mouth daily.     No current facility-administered medications on file prior to visit.    Allergies:   Allergies  Allergen Reactions   Ativan  [Lorazepam ]     Agitation/paradoxical reaction.   Oxycodone-Acetaminophen  Nausea Only   Latex Hives and Rash    Physical Exam General: well developed, well nourished pleasant elderly Caucasian lady, seated, in no evident distress Head: head normocephalic and atraumatic.  Neck: supple with no carotid or supraclavicular bruits Cardiovascular: regular rate and rhythm, no murmurs Musculoskeletal: no deformity Skin:  no rash/petichiae Vascular:  Normal pulses all extremities Vitals:   01/22/24 1001  BP: 110/68  Pulse: 71   Neurologic Exam Mental Status: Awake and fully alert. Oriented to place and time. Recent and remote memory intact. Attention span, concentration and fund of knowledge appropriate. Mood and affect appropriate.  Recall 3/3.  Clock drawing 4/4.  Able to name 7 animals which can walk on 4 legs. Cranial Nerves: Fundoscopic exam reveals sharp disc margins. Pupils equal, briskly reactive to light. Extraocular movements full without nystagmus. Visual fields full to confrontation. Hearing intact. Facial sensation intact. Face, tongue, palate moves normally and symmetrically.  Motor: Normal bulk and tone. Normal strength in all tested extremity muscles. Sensory.: intact to touch ,pinprick .position and vibratory sensation.  Coordination: Rapid alternating movements normal in all extremities. Finger-to-nose and heel-to-shin performed accurately bilaterally. Gait and Station: Arises from chair without difficulty. Stance is normal. Gait demonstrates normal stride length and balance . Able to heel, toe and tandem walk without difficulty.  Reflexes: 1+ and symmetric. Toes downgoing.   NIHSS  0 Modified Rankin  1   ASSESSMENT: 73 year old Caucasian lady with by cerebral small embolic infarcts in March 2025 of  cryptogenic etiology.  Vascular risk factors of hyperlipidemia, hypertension and cardiomyopathy.  She also has mild cognitive impairment.     PLAN:I had a long d/w patient and her husband about her recent cryptogenic stroke, mild cognitive impairment and memory loss risk for recurrent stroke/TIAs, personally independently reviewed imaging studies and stroke evaluation results and answered questions.Continue aspirin  81 mg daily  for secondary stroke prevention and maintain strict control of hypertension with blood pressure goal below 130/90, diabetes with hemoglobin A1c goal below 6.5% and lipids with LDL cholesterol goal below 70 mg/dL. I also advised the patient to eat a healthy diet with plenty of whole grains, cereals, fruits and vegetables, exercise regularly and maintain ideal body  weight .  Patient is worrying about her hair loss which she feels may be related to Lipitor.  I recommend she consider reducing the dose to 20 mg daily and if it persists consider switching to alternative like Zetia  or bempedoic acid after discussion with primary care physician..I encouraged her to increase participation in cognitively challenging activities like solving crossword puzzles, playing bridge and sudoku.  She may also try health supplements like Cerefolin or Prevagen if interested.  We also discussed memory compensation strategies.  Followup in the future with me in 6 months or call earlier if necessary.  Greater than 50% of time during this 40 minute visit was spent on counseling,explanation of diagnosis cryptogenic stroke, cardiomyopathy, memory loss, mild cognitive impairment, planning of further management, discussion with patient and family and coordination of care Eather Popp, MD Note: This document was prepared with digital dictation and possible smart phrase technology. Any transcriptional errors that result from this process are unintentional

## 2024-01-23 ENCOUNTER — Telehealth: Payer: Self-pay

## 2024-01-23 ENCOUNTER — Ambulatory Visit: Attending: Physical Medicine & Rehabilitation

## 2024-01-23 DIAGNOSIS — R4701 Aphasia: Secondary | ICD-10-CM | POA: Insufficient documentation

## 2024-01-23 DIAGNOSIS — R41840 Attention and concentration deficit: Secondary | ICD-10-CM | POA: Insufficient documentation

## 2024-01-23 DIAGNOSIS — R41841 Cognitive communication deficit: Secondary | ICD-10-CM | POA: Insufficient documentation

## 2024-01-23 DIAGNOSIS — I69318 Other symptoms and signs involving cognitive functions following cerebral infarction: Secondary | ICD-10-CM | POA: Diagnosis not present

## 2024-01-23 NOTE — Patient Outreach (Signed)
 No Telephone outreach to patient. Dr. Rosemarie obtained mRS successfully 01/22/24. MRS= 1  Shereen Gin [facility] Health Care Management Assistant  Direct Dial: [phone]  Fax: [phone] Website: [email or web address]

## 2024-01-23 NOTE — Therapy (Signed)
 OUTPATIENT SPEECH LANGUAGE PATHOLOGY TREATMENT   [name] MRN: [id number] DOB:Jun 02, 1951, 73 y.o., female Today's Date: 01/23/2024  PCP: Merna Huxley, NP REFERRING PROVIDER: Carilyn Prentice BRAVO, MD  END OF SESSION:  End of Session - 01/23/24 1511     Visit Number 9    Number of Visits 17    Date for SLP Re-Evaluation 02/02/24    SLP Start Time 1500    SLP Stop Time  1535    SLP Time Calculation (min) 35 min    Activity Tolerance Patient tolerated treatment well              Past Medical History:  Diagnosis Date   Acute combined systolic and diastolic heart failure 10/11/2023   ADHD (attention deficit hyperactivity disorder) 05/09/2007   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; multiple punctate foci of restricted diffusion in bilateral frontal lobes and the left parietal lobe   Diverticulitis of colon (without mention of hemorrhage) 09/17/2013   Essential hypertension 10/09/2017   HFrEF (heart failure with reduced ejection fraction) 10/10/2023   History of COVID-19 2021   Hyperlipidemia    Hypothyroidism    Low back pain radiating to right leg 10/09/2017   Mild cognitive impairment of uncertain or unknown etiology 08/04/2022   Mild intermittent asthma with acute exacerbation 05/30/2017   Mild neurocognitive disorder due to multiple etiologies 12/19/2023   NSVT (nonsustained ventricular tachycardia) 10/12/2023   Osteoarthritis    Postmenopausal atrophic vaginitis, severe 08/18/2008   Right knee pain 03/13/2014   S/P arthroscopy of right knee 06/03/2014   Vitamin B12 deficiency    Past Surgical History:  Procedure Laterality Date   ABDOMINAL HYSTERECTOMY     BREAST SURGERY     CARPAL TUNNEL RELEASE Bilateral    CERVICAL DISC ARTHROPLASTY     CESAREAN SECTION     x2   left shoulder surgery     LOOP RECORDER INSERTION N/A 10/12/2023   Procedure: LOOP RECORDER INSERTION;  Surgeon: Cindie Ole DASEN, MD;   Location: [facility];  Service: Cardiovascular;  Laterality: N/A;   MENISCUS REPAIR Right 2017   REDUCTION MAMMAPLASTY Bilateral    right shoulder     TONSILLECTOMY     Patient Active Problem List   Diagnosis Date Noted   Mild neurocognitive disorder due to multiple etiologies 12/19/2023   NSVT (nonsustained ventricular tachycardia) 10/12/2023   Acute combined systolic and diastolic heart failure 10/11/2023   HFrEF (heart failure with reduced ejection fraction) 10/10/2023   CVA (cerebral vascular accident) 10/08/2023   Mild cognitive impairment of uncertain or unknown etiology 08/04/2022   Vitamin B12 deficiency    Osteoarthritis 08/03/2022   Hyperlipidemia 08/03/2022   Essential hypertension 10/09/2017   Low back pain radiating to right leg 10/09/2017   Mild intermittent asthma with acute exacerbation 05/30/2017   S/P arthroscopy of right knee 06/03/2014   Right knee pain 03/13/2014   Diverticulitis of colon (without mention of hemorrhage) 09/17/2013   Postmenopausal atrophic vaginitis, severe 08/18/2008   Hypothyroidism 05/09/2007   ADHD (attention deficit hyperactivity disorder) 05/09/2007    ONSET DATE:  10/08/23  REFERRING DIAG: P30.679 (ICD-10-CM) - Aphasia as late effect of stroke  THERAPY DIAG:  No diagnosis found.  Rationale for Evaluation and Treatment: Rehabilitation  SUBJECTIVE:   SUBJECTIVE STATEMENT: Pt returns with homework completed. [name] did some of his own sequencing tasks and had pt put the steps in order.    Pt accompanied by: self  PERTINENT HISTORY: HPI: Pt presented to [facility] ED on 10/08/2023 with sudden onset of left facial droop and left-sided weakness. ADHD, HTN, diverticulitis, HLD, hypothyroidism, low back pain, neck fusion, mild cognitive impairment (dx Jan 2024), cervical disc arthroplasty, OA in R knee, asthma   PAIN:  Are you having pain? No  FALLS: Has patient fallen in last 6 months?  No  PATIENT GOALS: Improve language  function, memory  OBJECTIVE:  Note: Objective measures were completed at Evaluation unless otherwise noted.  DIAGNOSTIC FINDINGS:  MRI 10/09/23 IMPRESSION: 1. Findings compatible with multiple punctate acute infarcts in bilateral frontal and left parietal lobes. Given involvement of multiple vascular territories, consider an embolic etiology. 2. Significantly motion limited study.   Date of Discharge from SLP service:October 19, 2023 Clinical Impression/Discharge Summary: Pt has made great gains and has met 4 of 6 LTG's this admission due to improved cognition and language. Pt is currently an overall mod I A for orientation tasks, though requires up to [facility] for attention. Patient requires mod I A for expressive language, though continues to require occasional min-modA for complex receptive language tasks.  Pt/family education complete and pt will discharge home with 24 hour supervision from friends/family/etc. Pt would benefit from OP f/u ST services to maximize cognition and communication in order to maximize functional independence.     STANDARDIZED ASSESSMENTS:   Cognitive Linguistic Quick Test  AGE - 70-89  The Cognitive Linguistic Quick Test (CLQT) was administered to assess the relative status of five cognitive domains: attention, memory, language, executive functioning, and visuospatial skills. Scores from 10 tasks were used to estimate severity ratings (standardized for age groups 18-69 years and 70-89 years) for each domain, a clock drawing task, as well as an overall composite severity rating of cognition.     Task Score Criterion Cut Scores  Personal Facts 8/8 8  Symbol Cancellation 12/12 10  Confrontation Naming 10/10 10  Clock Drawing  12/13 11  Story Retelling 8/10 5  Symbol Trails 10/10 6  Generative Naming 3/9 4  Design Memory 4/6 4  Mazes  4/8 4  Design Generation 3/13 5   Cognitive Domain Composite Score Severity Rating  Attention 181/215 WNL  Memory 147/185 WNL   Executive Function 20/40 Low-WNL  Language 29/37 Low-WNL  Visuospatial Skills 75/105 WNL  Clock Drawing  12/13 WNL  Composite Severity Rating  WNL     PATIENT REPORTED OUTCOME MEASURES (PROM): Communication Effectiveness Survey: 12/20/23 - pt scored herself 18/32, with higher scores indicating less of a negative effect of pt's deficits on daily life and QOL.                                                                                                                              TREATMENT DATE:  Semantic Feature Analysis (SFA)  01/23/24: Bunnie required incr'd cue level from [place] as steps increased for her homework. Today SLP with long discussion what pt could complete with husband  at home to work with china attention and organization. Suggested cooking, baking, or organizing pantry or junk drawer in the kitchen, with steps written out in an organizational manner prior to beginning her work.  SLP also discussed with pt and [name] about pt keeping her own calendar at this time, with [name] assisting PRN. SLP expained that over time pt would do this independently but at this time she will need George's assistance and that this was a stepping stone to her eventually keeping her own calendar independently like she did prior to CVA.   01/08/24: Pt with 3 hour visit this morning from sister, and brother (from [facility]). Today SLP helped pt with occasional mod A with problem solving/sequencing task. She req'd extra time and redirection at times with 13-step sequencing task. Took pt 17 minutes for this task. Level of fatigue at which pt entered may have played a role.  [name] had question about 6-step sequencing tasks- he could not find any online. SLP suggested he and pt think through some everyday tasks and write down 6 steps in them and SLP provided 7-8 examples.  01/03/24: Pt brought homework in completed. She enjoyed sequencing task. With simple problem solving tasks pt req'd rare min A, but  performance was functional.  Her speech today was WNL rate, without anomia. When she has anomia, she uses synonym or description strategy. SLP observed pt to use description strategy in previous sessions. Homework provided.  01/01/24: Pt did not complete any SFA since last session. SLP told pt to complete when she has anomia with a noun. Pt brought homework with her completed. She finished 40 wrtiten definitions in approx 90 minutes. Today SLP targeted simple problem solving with 5-step sequencing task. Pt took almost 10 minutes to complete 4 sequences. She was provided with homework until next session.  12/28/23: Pt brought in books that daughter bought her. SLP to look at these and star pages SLP thinks would be helpful for pt. SLP worked with pt today with SFA - grill. She req'd mod A occasionally for procedure and for more accurate wording, especially description. SLP provided homework for description.  12/25/23:  Minimal difficulty with letter fill in homework. Focusing on problem solving in  cognitive linguistics today, SLP assisted pt with scrambled sentences.Four to 5-word sentences were completed with 4/4 success. When pt worked on 7-9 word sentences with SLP she had intermittent difficulty with mental flexibility with word choice. SLP provided mod cues faded to min nonverbal cues. Pt's attention/memory deficit made alternating attention difficult at times so SLP suggested pt write the word order above the words and this helped pt compensate for decr'd alternating attention.  SLP provided compensations for anomia for pt and educated her and [name] about these.   12/20/23: PROM provided today, with score above. [name] cueing pt about the accuracy of her answers. Needs anomia compensations next session. SLP asked [name] to fill out a CES as well for SLP to compare. Pt completed 3 clues task with 90% success, however only named average 2 items in a common category for divergent naming prior to requiring  consistent mod-max  A. SLP provided letter fill ins for homework, and divergent naming for flowers in her yard, and for sports.  12/14/23: PT NEEDS PROM NEXT SESSION. SLP completed CLQT today. From pt's CLQT score it would indicate that although pt's Composite Score is WNL she had difficulty with processing speed (mazes, design generation), problem solving Forensic psychologist), memory Training and development officer memory), and language (generative naming). Goals  added.   12/07/23: SLP educated pt and husband [name]) with describing phonemic cues, as this appeared to help pt during BNT. SLP provided examples during BNT and told [name] to assist pt in this way if he is sure of the word pt means to say. Secondly, SLP educated pt and husband on how to complete semantic feature analysis (SFA) at home with words/nouns pt experiences anomia with. SLP explained benefits of SFA to pt and husband. [name] took notes during session. SLP provided handouts for this framework.  PATIENT EDUCATION: Education details: see treatment date Person educated: Patient and Spouse Education method: Medical illustrator Education comprehension: verbalized understanding and needs further education   GOALS: Goals reviewed with patient? Yes in general  SHORT TERM GOALS: Target date: 01/05/24  Pt will complete cognitive linguistic assessment in first 2 sessions  Baseline: Goal status: met  2.  Pt will successfully use anomia compensations with min A to do so, for 10 minutes functional simple conversation in 2 sessions Baseline:  Goal status: partially met  3.  Pt will demo how to use SFA with rare min A in 3 sessions Baseline: 01/03/24 Goal status: partially met  4.  Pt will demo [facility] problem solving skills in simple cognitive linguistic tasks in 2 sessions Baseline: 01/03/24 Goal status: partially met   LONG TERM GOALS: Target date: 02/02/24  Pt will improve PROM compared to initial administration Baseline:  Goal status:  INITIAL  2.  Pt will participate in 10 minutes mod complex conversation with functional expressive language (using anomia compensations) in 3 sessions Baseline:  Goal status: INITIAL  3.  Pt will demo functional problem solving skills in practical cognitive linguistic tasks in 3 sessions Baseline:  Goal status: INITIAL   ASSESSMENT:  CLINICAL IMPRESSION: Patient is a 73 y.o. F who was seen today for treatment of aphasia and problem solving in light of bilateral frontal CVA, and parietal CVA. See treatment date above for today's date for further details on today's session. Given her premorbid dx of ADHD and MCI and OT focus on other areas identified on CLQT, SLP will focus on problem solving in functional cognitive linguistic tasks. She also demonstrated s/sx of cognitive linguistic impairment requiring a full evaluation in the first 2 sessions. Pt with premorbid dx of ADHD and MCI.   OBJECTIVE IMPAIRMENTS: include attention, memory, awareness, and aphasia. These impairments are limiting patient from managing medications, managing appointments, managing finances, household responsibilities, ADLs/IADLs, and effectively communicating at home and in community. Factors affecting potential to achieve goals and functional outcome are ability to learn/carryover information and previous level of function. Patient will benefit from skilled SLP services to address above impairments and improve overall function.  REHAB POTENTIAL: Good  PLAN:  SLP FREQUENCY: 2x/week  SLP DURATION: 8 weeks  PLANNED INTERVENTIONS: Language facilitation, Environmental controls, Cueing hierachy, Cognitive reorganization, Internal/external aids, Functional tasks, Multimodal communication approach, SLP instruction and feedback, Compensatory strategies, Patient/family education, [phone] Treatment of speech (30 or 45 min) , and [id number] (Standardized cognitive assessment)    Chancey Ringel, CCC-SLP 01/23/2024, 3:11  PM

## 2024-01-25 ENCOUNTER — Ambulatory Visit (INDEPENDENT_AMBULATORY_CARE_PROVIDER_SITE_OTHER): Admitting: Otolaryngology

## 2024-01-25 ENCOUNTER — Encounter (INDEPENDENT_AMBULATORY_CARE_PROVIDER_SITE_OTHER): Payer: Self-pay | Admitting: Otolaryngology

## 2024-01-25 ENCOUNTER — Encounter

## 2024-01-25 VITALS — BP 126/72 | HR 61

## 2024-01-25 DIAGNOSIS — J3489 Other specified disorders of nose and nasal sinuses: Secondary | ICD-10-CM

## 2024-01-25 DIAGNOSIS — R040 Epistaxis: Secondary | ICD-10-CM

## 2024-01-25 MED ORDER — IPRATROPIUM BROMIDE 0.06 % NA SOLN: 2.0000 | Freq: Two times a day (BID) | NASAL | 12 refills | Status: AC | PRN

## 2024-01-25 NOTE — Progress Notes (Signed)
 Dear Dr. Abigail, Here is my assessment for our mutual patient, Gabrielle Vargas. Thank you for allowing me the opportunity to care for your patient. Please do not hesitate to contact me should you have any other questions. Sincerely, Dr. Eldora Blanch  Otolaryngology Clinic Note  HISTORY: Gabrielle Vargas is a 73 y.o. female kindly referred by Dr. Abigail for evaluation of epistaxis.  Initial visit (01/2024): Epistaxis history: occasional, does not remember which side. Started late winter/early spring (unsure exactly when) but she reports that nose felt dry. Memory is not as good after recent stroke. Last episode was months ago. Cold compresses made it stop. Never had to go to ED, lasted few mins, low volume. Twice a week or so, but has not occurred since around may. CKD/Liver dysfunction: denies Anticoagulation/AP: ASA 81 Trauma: no History of Sinusitis: no Nasal obstruction: no Nasal procedures: no Current nasal medication use: no  Also reports bilateral (right > left) nasal dripping. Mucoid. Only really occurs in winter when nose is dry. Does not report significant nose blowing. Has not tried nasal sprays. Not happening with foods, has not noted exacerbation with temp/pressure. No headaches. Does not happen with straining. No PND. Denies CRS sx.  GLP-1: no AP/AC: ASA 81  Tobacco: never  PMHx: Cryptogenic stroke, HLD, HFrEF, HTN, Carotid Bruit, Hypothyroidism  RADIOGRAPHIC EVALUATION AND INDEPENDENT REVIEW OF OTHER RECORDS:: Dayna Dunn (10/2023): noted continued epistaxis at night, unclear volume; Dx: Epistaxis; Rx; ref to ENT CBC and CMP: 11/13/2023, 11/20/2023 respectively: WBC 8.3, Hgb 13.4, Plt 208, BUN/Cr wnl; Alk phos 131, AST/ALT 24/41 CTH 10/08/2023 independently interpreted with respect to sinuses: septal deviation right, no significant sinonasal opacification or disease; cuts thick so suboptimal for skull base eval but no noted dehiscence Past Medical History:  Diagnosis Date    Acute combined systolic and diastolic heart failure 10/11/2023   ADHD (attention deficit hyperactivity disorder) 05/09/2007   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; multiple punctate foci of restricted diffusion in bilateral frontal lobes and the left parietal lobe   Diverticulitis of colon (without mention of hemorrhage) 09/17/2013   Essential hypertension 10/09/2017   HFrEF (heart failure with reduced ejection fraction) 10/10/2023   History of COVID-19 2021   Hyperlipidemia    Hypothyroidism    Low back pain radiating to right leg 10/09/2017   Mild cognitive impairment of uncertain or unknown etiology 08/04/2022   Mild intermittent asthma with acute exacerbation 05/30/2017   Mild neurocognitive disorder due to multiple etiologies 12/19/2023   NSVT (nonsustained ventricular tachycardia) 10/12/2023   Osteoarthritis    Postmenopausal atrophic vaginitis, severe 08/18/2008   Right knee pain 03/13/2014   S/P arthroscopy of right knee 06/03/2014   Vitamin B12 deficiency    Past Surgical History:  Procedure Laterality Date   ABDOMINAL HYSTERECTOMY     BREAST SURGERY     CARPAL TUNNEL RELEASE Bilateral    CERVICAL DISC ARTHROPLASTY     CESAREAN SECTION     x2   left shoulder surgery     LOOP RECORDER INSERTION N/A 10/12/2023   Procedure: LOOP RECORDER INSERTION;  Surgeon: Cindie Ole DASEN, MD;  Location: [facility];  Service: Cardiovascular;  Laterality: N/A;   MENISCUS REPAIR Right 2017   REDUCTION MAMMAPLASTY Bilateral    right shoulder     TONSILLECTOMY     Family History  Problem Relation Age of Onset   Melanoma Mother    Kidney disease Father    Diabetes Father    Heart  disease Father    Breast cancer Paternal Grandmother    ADD / ADHD Other        multiple family members   Colon cancer Neg Hx    Esophageal cancer Neg Hx    Rectal cancer Neg Hx    Stomach cancer Neg Hx    Social History   Tobacco Use   Smoking status: Never    Smokeless tobacco: Never  Substance Use Topics   Alcohol use: No   Allergies  Allergen Reactions   Ativan  [Lorazepam ]     Agitation/paradoxical reaction.   Oxycodone-Acetaminophen  Nausea Only   Latex Hives and Rash   Current Outpatient Medications  Medication Sig Dispense Refill   ipratropium (ATROVENT ) 0.06 % nasal spray Place 2 sprays into both nostrils 2 (two) times daily as needed. 15 mL 12   Acetylcysteine (NAC) 500 MG CAPS Take 500 mg by mouth daily.     amphetamine -dextroamphetamine  (ADDERALL) 10 MG tablet Take 1 tablet (10 mg total) by mouth daily with breakfast. This dose is decreased as before admission. 30 tablet 0   amphetamine -dextroamphetamine  (ADDERALL) 10 MG tablet Take 1 tablet (10 mg total) by mouth daily with breakfast. This dose is decreased as before admission. 30 tablet 0   amphetamine -dextroamphetamine  (ADDERALL) 10 MG tablet Take 1 tablet (10 mg total) by mouth daily with breakfast. This dose is decreased as before admission. 30 tablet 0   Ascorbic Acid (VITAMIN C) 1000 MG tablet Take 1,000 mg by mouth daily.     aspirin  EC 81 MG tablet Take 81 mg by mouth daily. Swallow whole.     atorvastatin  (LIPITOR) 40 MG tablet Take 0.5 tablets (20 mg total) by mouth daily. 90 tablet 3   Bioflavonoid Products (QUERCETIN COMPLEX IMMUNE PO) Take 500 mg by mouth daily.     Cholecalciferol (D3-50 PO) Take 1 capsule by mouth daily.     Evening Primrose Oil 1000 MG CAPS Take 1,000 mg by mouth in the morning and at bedtime.     ezetimibe  (ZETIA ) 10 MG tablet Take 1 tablet (10 mg total) by mouth at bedtime. 90 tablet 3   metoprolol  succinate (TOPROL  XL) 25 MG 24 hr tablet Take 0.5 tablets (12.5 mg total) by mouth daily. 45 tablet 3   SYNTHROID  75 MCG tablet Take 1 tablet (75 mcg total) by mouth daily. 90 tablet 3   vitamin B-12 (CYANOCOBALAMIN ) 1000 MCG tablet Take 1,000 mcg by mouth 2 (two) times a week.     Zinc 20 MG CAPS Take 20 mg by mouth daily.     No current  facility-administered medications for this visit.   BP 126/72   Pulse 61   SpO2 97%   PHYSICAL EXAM:  BP 126/72   Pulse 61   SpO2 97%    Salient findings:  CN II-XII intact Bilateral EAC clear and TM intact with well pneumatized middle ear spaces Nose: Anterior rhinoscopy reveals prominent bilateral nasal septal vessels, bilateral inferior turbinates without significant hypertrophy.  Nasal endoscopy was indicated to better evaluate the nose and paranasal sinuses, given the patient's history and exam findings, and is detailed below. Head tilt test neg No lesions of oral cavity/oropharynx No obviously palpable neck masses/lymphadenopathy/thyromegaly No respiratory distress or stridor   PROCEDURE:  Prior to initiating any procedures, risks/benefits/alternatives were explained to the patient and verbal consent obtained. Diagnostic Nasal Endoscopy Pre-procedure diagnosis: Concern for epistaxis, rhinorrhea Post-procedure diagnosis: same Indication: See pre-procedure diagnosis and physical exam above Complications: None apparent EBL: 0 mL  Anesthesia: Lidocaine  4% and topical decongestant was topically sprayed in each nasal cavity  Description of Procedure:  Patient was identified. A rigid 30 degree endoscope was utilized to evaluate the sinonasal cavities, mucosa, sinus ostia and turbinates and septum.  Overall, signs of mucosal inflammation are not noted.  Also noted are no evidence of pulsatile fluid or lesions over visualized skull base and olfactory cleft.  No mucopurulence, polyps, or masses noted.   Right Middle meatus: clear Right SE Recess: clear Left MM: clear Left SE Recess: clear  CPT CODE -- 31231 - Mod 25   ASSESSMENT:  73 y.o. with:  1. Epistaxis   2. Rhinorrhea     Prominent bilateral septal vessels, oflactory cleft without issue; endo reassuring If can collect liquid, will send for B2; otherwise f/u as needed per her preference Do no tthink CSF  PLAN: We've  discussed issues and options today.  We reviewed the nasal endoscopy images together.  The risks, benefits and alternatives were discussed and questions answered.  Epistaxis most likely from dry nose and from septal vessels. Endo reassuring. We discussed rhinorrhea - given mostly in winter, suspect from dry nose but could also be vasomotor/NAR.  D/w pt options including cautery and further imaging but given recent multiple rounds of imaging, she deferred and opted for medical management    She has elected to proceed with:  1) Atrovent  BID PRN 2) Humidification with ayr gel QID and humidifier If sx recur, advised to call; monitor rhinorrhea, if collectable, call office and can send for B2  See below regarding exact medications prescribed this encounter including dosages and route: Meds ordered this encounter  Medications   ipratropium (ATROVENT ) 0.06 % nasal spray    Sig: Place 2 sprays into both nostrils 2 (two) times daily as needed.    Dispense:  15 mL    Refill:  12     Thank you for allowing me the opportunity to care for your patient. Please do not hesitate to contact me should you have any other questions.  Sincerely, Eldora Blanch, MD Otolaryngologist (ENT), [facility] Phone: [phone] Fax: [phone]  MDM:  Level 4: [phone] Complexity/Problems addressed: mod Data complexity: mod - independent interpretation of imaging; review of notes, labs - Morbidity: mod  - Prescription Drug prescribed or managed: y  02/04/2024, 11:10 AM

## 2024-01-25 NOTE — Patient Instructions (Addendum)
  Ayr gel: Apply a pea sized amount up to 4 times daily just to inside of each nostril like above, then pinch your nose for 10 seconds. Use this consistently.     For dripping in the nose, use atrovent  (ipratropium) two sprays in each nostril twice per day as needed.

## 2024-01-26 ENCOUNTER — Encounter

## 2024-01-27 ENCOUNTER — Ambulatory Visit: Payer: Self-pay | Admitting: Cardiology

## 2024-01-29 ENCOUNTER — Ambulatory Visit: Admitting: Occupational Therapy

## 2024-01-29 DIAGNOSIS — R41840 Attention and concentration deficit: Secondary | ICD-10-CM

## 2024-01-29 DIAGNOSIS — I69318 Other symptoms and signs involving cognitive functions following cerebral infarction: Secondary | ICD-10-CM

## 2024-01-29 DIAGNOSIS — R41841 Cognitive communication deficit: Secondary | ICD-10-CM | POA: Diagnosis not present

## 2024-01-29 NOTE — Therapy (Signed)
 OUTPATIENT OCCUPATIONAL THERAPY NEURO Treatment  [name] MRN: [id number] DOB:1950/10/10, 73 y.o., female Today's Date: 01/29/2024  PCP: Merna Huxley, NP REFERRING PROVIDER: Carilyn Prentice BRAVO, MD  END OF SESSION:            Past Medical History:  Diagnosis Date   Acute combined systolic and diastolic heart failure 10/11/2023   ADHD (attention deficit hyperactivity disorder) 05/09/2007   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; multiple punctate foci of restricted diffusion in bilateral frontal lobes and the left parietal lobe   Diverticulitis of colon (without mention of hemorrhage) 09/17/2013   Essential hypertension 10/09/2017   HFrEF (heart failure with reduced ejection fraction) 10/10/2023   History of COVID-19 2021   Hyperlipidemia    Hypothyroidism    Low back pain radiating to right leg 10/09/2017   Mild cognitive impairment of uncertain or unknown etiology 08/04/2022   Mild intermittent asthma with acute exacerbation 05/30/2017   Mild neurocognitive disorder due to multiple etiologies 12/19/2023   NSVT (nonsustained ventricular tachycardia) 10/12/2023   Osteoarthritis    Postmenopausal atrophic vaginitis, severe 08/18/2008   Right knee pain 03/13/2014   S/P arthroscopy of right knee 06/03/2014   Vitamin B12 deficiency    Past Surgical History:  Procedure Laterality Date   ABDOMINAL HYSTERECTOMY     BREAST SURGERY     CARPAL TUNNEL RELEASE Bilateral    CERVICAL DISC ARTHROPLASTY     CESAREAN SECTION     x2   left shoulder surgery     LOOP RECORDER INSERTION N/A 10/12/2023   Procedure: LOOP RECORDER INSERTION;  Surgeon: Cindie Ole DASEN, MD;  Location: [facility];  Service: Cardiovascular;  Laterality: N/A;   MENISCUS REPAIR Right 2017   REDUCTION MAMMAPLASTY Bilateral    right shoulder     TONSILLECTOMY     Patient Active Problem List   Diagnosis Date Noted   Mild neurocognitive disorder  due to multiple etiologies 12/19/2023   NSVT (nonsustained ventricular tachycardia) 10/12/2023   Acute combined systolic and diastolic heart failure 10/11/2023   HFrEF (heart failure with reduced ejection fraction) 10/10/2023   CVA (cerebral vascular accident) 10/08/2023   Mild cognitive impairment of uncertain or unknown etiology 08/04/2022   Vitamin B12 deficiency    Osteoarthritis 08/03/2022   Hyperlipidemia 08/03/2022   Essential hypertension 10/09/2017   Low back pain radiating to right leg 10/09/2017   Mild intermittent asthma with acute exacerbation 05/30/2017   S/P arthroscopy of right knee 06/03/2014   Right knee pain 03/13/2014   Diverticulitis of colon (without mention of hemorrhage) 09/17/2013   Postmenopausal atrophic vaginitis, severe 08/18/2008   Hypothyroidism 05/09/2007   ADHD (attention deficit hyperactivity disorder) 05/09/2007    ONSET DATE: 10/08/23  REFERRING DIAG: I63.9 (ICD-10-CM) - Cerebral infarction, unspecified  THERAPY DIAG:  No diagnosis found.  Rationale for Evaluation and Treatment: Rehabilitation  SUBJECTIVE:   SUBJECTIVE STATEMENT: Pt reports seeing Dr. Rosemarie and he said that he would f/u with her in >6 months.  Pt's spouse stating that her recall and memory is impacting her use of her phone independently.    Pt accompanied by: self and spouse  PERTINENT HISTORY: ADHD, HTN, diverticulitis, HLD, hypothyroidism, low back pain, neck fusion, mild cognitive impairment, OA in R knee, asthma   presented to [facility] ED on 10/08/2023 with sudden onset of left facial droop, left-sided weakness,and L visual cut. MRI: Multiple punctate acute infarcts bilateral frontal and left parietal lobes. EEG suggestive of moderate  diffuse encephalopathy.   PRECAUTIONS: Fall  WEIGHT BEARING RESTRICTIONS: No  PAIN:  Are you having pain? No pain  FALLS: Has patient fallen in last 6 months? No  LIVING ENVIRONMENT: Lives with: lives with their spouse Lives in:  House/apartment Stairs: Yes: Internal: bedroom/bathroom on main floor, does have a full flight of steps - spouse's office is upstairs  steps; one rail and External: 2 steps;   Has following equipment at home: shower chair and hand held shower head  PLOF: Independent, Independent with basic ADLs, and Leisure: walking up to 4-6 hours per day  PATIENT GOALS: I really don't know  OBJECTIVE:  Note: Objective measures were completed at Evaluation unless otherwise noted.  HAND DOMINANCE: Right  ADLs: Overall ADLs: Mod I with bathing and dressing, pt's spouse will go in and out of bathroom while pt in shower providing distant supervision to Mod I level Transfers/ambulation related to ADLs: Mod I without AD, getting into high bed without any issue  IADLs: Shopping: pt gets overwhelmed with everything that is going on, easily distracted  Meal Prep: spouse is doing most of the cooking right now as pt forgetful with sequencing and ingredients, and easily distracted Community mobility: not cleared to drive at this time Medication management: spouse is assisting now, he will fill up a daily pill box for pt   MOBILITY STATUS: Mod I without AD  POSTURE COMMENTS:  No Significant postural limitations  ACTIVITY TOLERANCE: Activity tolerance: pt reports worn out after 30 min walk (where as they would walk up to 2 hrs at a time before stroke).  Pt is not sleeping as much during the day time.  FUNCTIONAL OUTCOME MEASURES: 9 hole peg test: Right: 36.54 sec; Left: 34.06 sec Trail Making: 42.47 sec for Trail A   UPPER EXTREMITY ROM:  WFL bilaterally  UPPER EXTREMITY MMT:     MMT Right eval Left eval  Shoulder flexion 4+ 4-  Shoulder abduction    Shoulder adduction    Shoulder extension    Shoulder internal rotation    Shoulder external rotation    Middle trapezius    Lower trapezius    Elbow flexion 4 4  Elbow extension 4- 4-  Wrist flexion    Wrist extension    Wrist ulnar deviation     Wrist radial deviation    Wrist pronation    Wrist supination    (Blank rows = not tested)  COORDINATION: 9 Hole Peg test: Right: 36.54 sec; Left: 34.06 sec Box and Blocks:  Right 43 blocks, Left 36blocks - Pt requiring cues x3 to remember to only pick up one block at a time when completing with Right; requiring cues to go over barrier instead of around (possibly slowing down pace even more).  SENSATION: WFL  COGNITION: Overall cognitive status: Impaired and History of cognitive impairments - at baseline Pt able to recall 3 words: sock blue bed after 5 mins  Pt with difficulty with word finding and memory  VISION: Subjective report: wears glasses, reported f/u with neurology and ophthalmologist who report to wait for further assessment to allow recovery s/p CVA Baseline vision: Wears glasses all the time  VISION ASSESSMENT: To be further assessed in functional context Eye alignment: WFL Ocular ROM: WFL Gaze preference/alignment: WDL Tracking/Visual pursuits: TBA Visual Fields: TBA in functional context   Bell cancellation test: locating 32/35 in 3:00, pt then able to locate remaining 2/3 in 4:20 before stating I think that is all  PERCEPTION: Impaired  PRAXIS:  Impaired: Initiation, Motor planning, and Organization  OBSERVATIONS: Difficult to assess vision, attention, and strength as pt requiring increased cueing and repeat directions.  Unsure full attention and effort during MMT this session.  Pt forgetting instructions during coordination assessment, requiring wait time for recall and/or additional cues.                                                                                                                             TREATMENT DATE:  01/29/24 Trail Making: Trail A: 37.37 sec and Trail B: 3:05.  Pt requiring significantly increased time to alternate attention between numbers and letters.  OT educating on functional carryover of task with encouragement to focus  on single tasks at a time and assess ability to add additional task.  Educated on awareness with pt and spouse on when to add and when to remove second task to challenge attention.   Sequencing: pt brought in sequencing homework with good completion.  Pt stating that she did well with all of the steps listed out.  Therefore OT challenging pt to verbally list steps of 2 cooking tasks.  Pt with good sequencing, however not specific in details.   Phone use: pt and spouse still reporting decreased use of personal cell phone, especially when responding to text messages.  OT had pt use phone to call spouse and then to send text message.  Pt demonstrating good use of short cuts with spouse's contact as icon on phone as well as use of predictive text to aid in speed and thinking with texting.  OT encouraged pt to attempt to respond when receiving a text and talk it off if needed prior to responding.  OT encouraged pt to remove extra apps from phone to decrease confusion when navigating to most used apps.    01/10/24 Logic puzzles: engaged in sequencing and problem solving organization of daily task prompt.  Pt demonstrating improved ease with more simple prompts compared to more complex prompts.  OT breaking down prompt to facilitate increased ease and sequencing, including listing tasks and having pt place numbers to organize.  OT utilizing who, what, when, where, why prompts to answer some complex problem solving questions.  OT also educating on energy conservation tasks to break up tasks across days to increase success if multiple tasks.  OT educated on 4P's of energy conservation (planning, prioritizing, pacing, and positioning) and providing cues and examples for each.   Pill box assessment.  Pt completed with 100% accuracy in 6:17.  Pt with no errors, however requiring increased time for organization and sequencing.  PT demonstrating good technique by flipping over pill bottles after dispensing to increase  recall.  OT reiterating use of recall/memory strategies to increase recall, recommending use of checks and balances.  OT encouraged pt to take a more active roll in filling out pill box at home as well as other familiar tasks, with supervision, to continue to address organization, sequencing, and recall.  01/08/24 Assessed goals - see goal section for details.  Pt continues to require increased time and effort for sequencing, recall, and problem solving during structured and functional tasks. Box and blocks: Right 41 blocks, Left 42 blocks - Pt demonstrating improved recall of instructions with only initial instruction Logic puzzles: engaged in 5x5 puzzle to improve sequencing, deductive reasoning, memory, and recall during structured task.  OT completing puzzle with pt that she had attempted at home and was unable to solve.  OT educating on deductive reasoning as pt not explicitly given each instruction but based on some clues is able to deduce information to solve some other clues.  OT providing education on and sample situation when logic puzzle would carry over to functional situation.  Pt still demonstrating difficulty with functional situation with planning a dessert for family members based on their dietary preferences.   Sequencing: engaged in simple planning/problem solving prompt requiring pt to complete mental math to ensure ability to complete various prompts.  Pt with good mental math in increments of 10, but with increased difficulty when increasing number and increasing options.  OT educated pt on writing out steps to allow for increased organization and recall and then sequencing steps in that manner.  Educated on endurance impacting tolerance to engage in various tasks.   PATIENT EDUCATION: Education details: see today's tx above Person educated: Patient and Spouse Education method: Explanation and Handouts Education comprehension: verbalized understanding and needs further  education  HOME EXERCISE PROGRAM: Provided written suggestions for keeping thinking skills sharp - plan to provide printout at future session 12/13/23 - memory compensation strategies and activities to keep thinking skills sharp (see pt instructions)   GOALS: Goals reviewed with patient? Yes  SHORT TERM GOALS: Target date: 12/15/23  Pt will be independent with coordination HEP with visual handouts for recall. Baseline: new to OPOT Goal status: in progress  2.  Pt will demonstrate understanding of memory compensations and ways to keep thinking skills sharp Baseline: STM deficits and impaired initiation 01/08/24 - pt utilizing writing information down,  WARM strategy, and engaging in logic puzzles with assistance from spouse for attention, sequencing, and deductive reasoning.   Goal status: MET  3.  Pt will demonstrate and/or verbalize understanding of visual attention exercises and compensatory strategies for improved visual scanning and attention. Baseline: left field cut 01/08/24 - pt and spouse report that this does not seem to be an area of concern any longer Goal status: MET, however would benefit from f/u  4.  Pt will demonstrate improved coordination, attention to task, and recall of instructions by completing box and blocks assessment with improved score by 6 blocks bilaterally. Baseline: Right 43 blocks, Left 36blocks - Pt requiring cues x3 to remember to only pick up one block at a time when completing with Right; requiring cues to go over barrier instead of around (possibly slowing down pace even more). 01/08/24: Right 41 blocks, Left 42 blocks - Pt demonstrating improved recall of instructions with only initial instruction Goal status: partially met: not met on RUE, met with LUE    LONG TERM GOALS: Target date: 03/04/24  Pt will verbalize understanding of task modifications, adaptive strategies, and/or potential AE needs to increase ease, safety, and independence w/  IADLs. Baseline: decreased engagement in IADLs due to impaired memory and attention Goal status: in progress  2.  Pt will complete simulated medication management activity w/ Supervision by discharge, incorporating compensatory strategies/AE prn Baseline: spouse managing meds at this time, was  not prior to CVA Goal status: in progress  3.  Pt will demonstrate ability to sequence simple functional task (simple snack prep, laundry task, etc) at Mod I level with good safety awareness and ability to utilize memory strategies for recall. Baseline:  Goal status: in progress  4.  Pt will navigate a moderately busy environment, completing dual task activity and/or following multi-step commands with 90% accuracy Baseline:  Goal status: in progress  5.  Pt will demonstrate improved initiation and sequencing to be able to navigate use of personal cell phone and send text messages in <15 mins. Baseline: spouse reporting 45 mins to reply to test message Goal status: in progress   ASSESSMENT:  CLINICAL IMPRESSION: Pt continues to demonstrate difficulty with alternating attention.  OT encouraging continued structured alternating attention/dual task activities with spouse, while decreasing functional need to dual task as able.  OT encourage pt and spouse to reflect on success and areas of challenges during routine, home making tasks and phone use. Pt is an active participant in cognitive retraining activities and sequencing, benefiting from more functional prompts and utilizing listing to increase recall and improve sequencing.  Pt and spouse receptive to education on continued dual tasking and/or incorporation of cognitive challenges into familiar, routine tasks to continue to focus on sequencing and problem solving. Pt will continue to benefit from additional skilled occupational therapy visits to allow for further focus on sequencing, recall, deductive reasoning with functional tasks to increase  participation in IADLs.   PERFORMANCE DEFICITS: in functional skills including ADLs, IADLs, coordination, strength, Fine motor control, Gross motor control, endurance, decreased knowledge of precautions, vision, and UE functional use, cognitive skills including attention, energy/drive, memory, problem solving, safety awareness, and sequencing, and psychosocial skills including environmental adaptation and routines and behaviors.     PLAN:  OT FREQUENCY: 1-2x/week  OT DURATION: 6 weeks (asking 8 weeks for scheduling)  PLANNED INTERVENTIONS: 02831 OT Re-evaluation, 97535 self care/ADL training, 02889 therapeutic exercise, 97530 therapeutic activity, 97112 neuromuscular re-education, functional mobility training, visual/perceptual remediation/compensation, psychosocial skills training, energy conservation, coping strategies training, patient/family education, and DME and/or AE instructions  RECOMMENDED OTHER SERVICES: SLP  CONSULTED AND AGREED WITH PLAN OF CARE: Patient and family member/caregiver  PLAN FOR NEXT SESSION:   Cognitive/motor dual tasking, sequencing, recall of 2-3 step directions  Phone use  Further assess during functional tasks, review PRN visual compensatory strategies (especially during functional tasks)  Visual scanning tasks  Sequencing tasks    KAYLENE DOMINO, OTR/L 01/29/2024, 9:22 AM  [facility] at [facility] [address], [address] [place], [place] [address] Phone # [phone] Fax # [phone]

## 2024-01-30 NOTE — Progress Notes (Unsigned)
 Cardiology Office Note    Date:  01/31/2024  ID:  Gabrielle Vargas, DOB Jun 09, 1951, MRN [id number] PCP:  Merna Huxley, NP  Cardiologist:  Vina Gull, MD  Electrophysiologist:  OLE ONEIDA HOLTS, MD   Chief Complaint: f/u cardiomyopathy   History of Present Illness: .    Gabrielle Vargas is a 73 y.o. female with visit-pertinent history of hypertension, hyperlipidemia, borderline DM, hypothyroidism, ADHD, mild cognitive impairment per neuropsych evaluation January 2024, stroke and chronic HFimpEF. She was admitted in 09/2023 for stroke and received TNK. 2D echo showed EF 30-35% without prior to compare to, mildly dilated LV, G1DD, mild MR, mild AI, no obvious LV thrombus with Definity  contrast however given apical and mid to distal septal WMAs patient seems to be at high risk for cardio-embolic events. General cardiology raised question of Takotsubo cardiomyopathy. She was recommended for initiation of GDMT and repeat echo in 3 months - if LVEF remained reduced would plan ischemic eval. Neurology recommended ASA + Plavix  x3 weeks then ASA alone - cardiology team did discuss empiric anticoagulation with neurology but given lack of LV thrombus seen, recommendation was for antiplatelets at discharge. She also had brief NSVT. EP placed ILR. Carotid duplex 12/2023 for right carotid bruit was near-normal. F/u 2D echo 12/2023 showed normalized EF 55-60%, G1DD, trivial MR/AI.  She is seen back today with her husband doing great. They do mention that occasionally after standing for periods of time she will get somewhat dizzy or feel off balance. They bring in a diligent log of BPs ranging from mid 90s-1teens, HR generally in the 50s. She has been concerned about some hair loss. PCP decreased her atorvastatin  today. Epistaxis reported at prior visit has not recurred.  Labwork independently reviewed: 11/2023 repeat K 4.4 after value 5.3, Cr 0.74, LFTs ok 10/2023 LDL 42, TSH, CBC ok, trig 56, Mg  2.3 09/2023 Cbc ok, K 4.1 (episodically low), Cr 0.79, Mg 2.1, LDL 33, trig 87, LFTs ok 08/2023 TSH OK  ROS: .    Please see the history of present illness.  All other systems are reviewed and otherwise negative.  Studies Reviewed: SABRA    EKG:  EKG is not ordered today  CV Studies: Cardiac studies reviewed are outlined and summarized above. Otherwise please see EMR for full report.   Current Reported Medications:.    Current Meds  Medication Sig   Acetylcysteine (NAC) 500 MG CAPS Take 500 mg by mouth daily.   amphetamine -dextroamphetamine  (ADDERALL) 10 MG tablet Take 1 tablet (10 mg total) by mouth daily with breakfast. This dose is decreased as before admission.   amphetamine -dextroamphetamine  (ADDERALL) 10 MG tablet Take 1 tablet (10 mg total) by mouth daily with breakfast. This dose is decreased as before admission.   amphetamine -dextroamphetamine  (ADDERALL) 10 MG tablet Take 1 tablet (10 mg total) by mouth daily with breakfast. This dose is decreased as before admission.   Ascorbic Acid (VITAMIN C) 1000 MG tablet Take 1,000 mg by mouth daily.   aspirin  EC 81 MG tablet Take 81 mg by mouth daily. Swallow whole.   atorvastatin  (LIPITOR) 40 MG tablet Take 0.5 tablets (20 mg total) by mouth daily.   Bioflavonoid Products (QUERCETIN COMPLEX IMMUNE PO) Take 500 mg by mouth daily.   Cholecalciferol (D3-50 PO) Take 1 capsule by mouth daily.   Evening Primrose Oil 1000 MG CAPS Take 1,000 mg by mouth in the morning and at bedtime.   ezetimibe  (ZETIA ) 10 MG tablet Take 1 tablet (10 mg total)  by mouth at bedtime.   ipratropium (ATROVENT ) 0.06 % nasal spray Place 2 sprays into both nostrils 2 (two) times daily as needed.   losartan  (COZAAR ) 25 MG tablet Take 0.5 tablets (12.5 mg total) by mouth daily.   metoprolol  succinate (TOPROL -XL) 25 MG 24 hr tablet Take 1 tablet (25 mg total) by mouth daily.   SYNTHROID  75 MCG tablet Take 1 tablet (75 mcg total) by mouth daily.   vitamin B-12  (CYANOCOBALAMIN ) 1000 MCG tablet Take 1,000 mcg by mouth 2 (two) times a week.   Zinc 20 MG CAPS Take 20 mg by mouth daily.    Physical Exam:    VS:  BP 118/64   Pulse 60   Ht 4' 11 (1.499 m)   Wt 109 lb 12.8 oz (49.8 kg)   SpO2 100%   BMI 22.18 kg/m    Wt Readings from Last 3 Encounters:  01/31/24 109 lb 12.8 oz (49.8 kg)  01/31/24 110 lb (49.9 kg)  01/22/24 109 lb (49.4 kg)    GEN: Well nourished, well developed in no acute distress NECK: No JVD; No carotid bruits CARDIAC: RRR, no murmurs, rubs, gallops RESPIRATORY:  Clear to auscultation without rales, wheezing or rhonchi  ABDOMEN: Soft, non-tender, non-distended EXTREMITIES:  No edema; No acute deformity   Asessement and Plan:.    1. Chronic HFimpEF with HTN, brief NSVT - ? Takotsubo event. EF improved to normal on subsequent study. Husband shares there had been some family stress around time of original event. See #2 regarding GDMT changes. OK to defer ischemic workup given no anginal symptoms and normalized EF. She appears euvolemic. They asked if they still need to weigh every single day; I am OK with them transitioning to once per week given no history of volume issues, but continue to follow blood pressure for now and notify for any new symptoms. Of note the patient has been on Adderall since her children were young. I will reach out to Dr. Okey to find out if she is OK with continuing this from cardiac standpoint. She is on 10mg  daily, listed on med list 3 separate times to indicate the 3 separate months' fills.  2. Dizziness - their blood pressure log does reveal she has episodic tendency for soft BP and HR in the 50s. I do not think stopping the baby dose of losartan  will make a huge impact on blood pressure, so will stop this and change her Toprol  to 12.5mg  daily. Check H/H, BMET today to ensure stable.  3. 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 No specific follow-up needed for this.  4.  Cryptogenic stroke - remains on ASA with ILR in place being monitored by EP. Follow rhythm on loop with decrease in metoprolol  above. Statin has been decreased by PCP due to hair loss, will defer subsequent f/u of lipids to them. This seems reasonable as LDL has been well controlled. If hair loss persists, consider referral to dermatology.    Disposition: F/u with Dr. Okey or me in 6 months.  Signed, Makenleigh Crownover N Murvin Gift, PA-C

## 2024-01-31 ENCOUNTER — Ambulatory Visit (INDEPENDENT_AMBULATORY_CARE_PROVIDER_SITE_OTHER): Admitting: Adult Health

## 2024-01-31 ENCOUNTER — Encounter: Payer: Self-pay | Admitting: Adult Health

## 2024-01-31 ENCOUNTER — Ambulatory Visit: Payer: Self-pay | Admitting: Adult Health

## 2024-01-31 ENCOUNTER — Encounter: Payer: Self-pay | Admitting: Physician Assistant

## 2024-01-31 ENCOUNTER — Ambulatory Visit: Attending: Physician Assistant | Admitting: Physician Assistant

## 2024-01-31 VITALS — BP 120/60 | HR 60 | Temp 97.5°F | Ht 59.0 in | Wt 110.0 lb

## 2024-01-31 VITALS — BP 118/64 | HR 60 | Ht 59.0 in | Wt 109.8 lb

## 2024-01-31 DIAGNOSIS — I340 Nonrheumatic mitral (valve) insufficiency: Secondary | ICD-10-CM

## 2024-01-31 DIAGNOSIS — I10 Essential (primary) hypertension: Secondary | ICD-10-CM | POA: Diagnosis not present

## 2024-01-31 DIAGNOSIS — I4729 Other ventricular tachycardia: Secondary | ICD-10-CM

## 2024-01-31 DIAGNOSIS — I639 Cerebral infarction, unspecified: Secondary | ICD-10-CM

## 2024-01-31 DIAGNOSIS — L659 Nonscarring hair loss, unspecified: Secondary | ICD-10-CM

## 2024-01-31 DIAGNOSIS — I351 Nonrheumatic aortic (valve) insufficiency: Secondary | ICD-10-CM

## 2024-01-31 DIAGNOSIS — I5032 Chronic diastolic (congestive) heart failure: Secondary | ICD-10-CM

## 2024-01-31 LAB — IBC + FERRITIN
Ferritin: 121.5 ng/mL (ref 10.0–291.0)
Iron: 112 ug/dL (ref 42–145)
Saturation Ratios: 25.9 % (ref 20.0–50.0)
TIBC: 432.6 ug/dL (ref 250.0–450.0)
Transferrin: 309 mg/dL (ref 212.0–360.0)

## 2024-01-31 LAB — TSH: TSH: 1.38 u[IU]/mL (ref 0.35–5.50)

## 2024-01-31 MED ORDER — METOPROLOL SUCCINATE ER 25 MG PO TB24: 12.5000 mg | ORAL_TABLET | Freq: Every day | ORAL | 3 refills | Status: AC

## 2024-01-31 NOTE — Progress Notes (Signed)
 Subjective:    Patient ID: Gabrielle Vargas, female    DOB: 25-Jul-1950, 73 y.o.   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), ADHD (attention deficit hyperactivity disorder) (05/09/2007), CVA (cerebral vascular accident) (10/08/2023), Diverticulitis of colon (without mention of hemorrhage) (09/17/2013), Essential hypertension (10/09/2017), HFrEF (heart failure with reduced ejection fraction) (10/10/2023), History of COVID-19 (2021), Hyperlipidemia, Hypothyroidism, Low back pain radiating to right leg (10/09/2017), Mild cognitive impairment of uncertain or unknown etiology (08/04/2022), Mild intermittent asthma with acute exacerbation (05/30/2017), Mild neurocognitive disorder due to multiple etiologies (12/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  She presents to the office today for concern of hair loss.  Noticed hair loss about 2 weeks ago, reports that when she combs her hair that she is getting a lot more loose hair than she has in the past.  She is concerned that it may be due to her statin therapy.  She does not think that she has a family history of hair loss but reports that her sister thinks that she is losing her hair as well.  She does have a history of hypothyroidism and is on 75 mg of Synthroid , last TSH in April 2025 was 1.98.  He was recently seen at neurology and brought this up to her neurologist who recommended trying to decrease the dose of Lipitor to 20 mg daily and he did send in a prescription for this but she has not started this yet.  Review of Systems See HPI   Past Medical History:  Diagnosis Date   Acute combined systolic and diastolic heart failure 10/11/2023   ADHD  (attention deficit hyperactivity disorder) 05/09/2007   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; multiple punctate foci of restricted diffusion in bilateral frontal lobes and the left parietal lobe   Diverticulitis of colon (without mention of hemorrhage) 09/17/2013   Essential hypertension 10/09/2017   HFrEF (heart failure with reduced ejection fraction) 10/10/2023   History of COVID-19 2021   Hyperlipidemia    Hypothyroidism    Low back pain radiating to right leg 10/09/2017   Mild cognitive impairment of uncertain or unknown etiology 08/04/2022   Mild intermittent asthma with acute exacerbation 05/30/2017   Mild neurocognitive disorder due to multiple etiologies 12/19/2023   NSVT (nonsustained ventricular tachycardia) 10/12/2023   Osteoarthritis    Postmenopausal atrophic vaginitis, severe 08/18/2008   Right knee pain 03/13/2014   S/P arthroscopy of right knee 06/03/2014   Vitamin B12 deficiency     Social History   Socioeconomic History   Marital status: Married    Spouse name: Not on file   Number of children: 2   Years of education: 13   Highest education level: Some college, no degree  Occupational History   Occupation: Retired  Tobacco Use   Smoking status: Never   Smokeless tobacco: Never  Vaping Use   Vaping status: Never Used  Substance and Sexual Activity   Alcohol use: No   Drug use: No   Sexual activity: Not on file  Other Topics Concern   Not on file  Social History Narrative   Retired   Married   2 children   Right handed   Live with husband   Social Drivers of Corporate investment banker Strain: Low  Risk  (12/23/2022)   Overall Financial Resource Strain (CARDIA)    Difficulty of Paying Living Expenses: Not hard at all  Food Insecurity: Patient Unable To Answer (10/09/2023)   Hunger Vital Sign    Worried About Running Out of Food in the Last Year: Patient unable to answer    Ran Out of Food in the Last Year: Patient unable to  answer  Transportation Needs: Patient Unable To Answer (10/09/2023)   PRAPARE - Transportation    Lack of Transportation (Medical): Patient unable to answer    Lack of Transportation (Non-Medical): Patient unable to answer  Physical Activity: Sufficiently Active (12/23/2022)   Exercise Vital Sign    Days of Exercise per Week: 7 days    Minutes of Exercise per Session: 60 min  Stress: No Stress Concern Present (12/23/2022)   [employer] of Occupational Health - Occupational Stress Questionnaire    Feeling of Stress : Not at all  Social Connections: Patient Unable To Answer (10/09/2023)   Social Connection and Isolation Panel    Frequency of Communication with Friends and Family: Patient unable to answer    Frequency of Social Gatherings with Friends and Family: Patient unable to answer    Attends Religious Services: Patient unable to answer    Active Member of Clubs or Organizations: Patient unable to answer    Attends Banker Meetings: Patient unable to answer    Marital Status: Patient unable to answer  Intimate Partner Violence: Patient Unable To Answer (10/09/2023)   Humiliation, Afraid, Rape, and Kick questionnaire    Fear of Current or Ex-Partner: Patient unable to answer    Emotionally Abused: Patient unable to answer    Physically Abused: Patient unable to answer    Sexually Abused: Patient unable to answer    Past Surgical History:  Procedure Laterality Date   ABDOMINAL HYSTERECTOMY     BREAST SURGERY     CARPAL TUNNEL RELEASE Bilateral    CERVICAL DISC ARTHROPLASTY     CESAREAN SECTION     x2   left shoulder surgery     LOOP RECORDER INSERTION N/A 10/12/2023   Procedure: LOOP RECORDER INSERTION;  Surgeon: Cindie Ole DASEN, MD;  Location: [facility];  Service: Cardiovascular;  Laterality: N/A;   MENISCUS REPAIR Right 2017   REDUCTION MAMMAPLASTY Bilateral    right shoulder     TONSILLECTOMY      Family History  Problem Relation Age of Onset    Melanoma Mother    Kidney disease Father    Diabetes Father    Heart disease Father    Breast cancer Paternal Grandmother    ADD / ADHD Other        multiple family members   Colon cancer Neg Hx    Esophageal cancer Neg Hx    Rectal cancer Neg Hx    Stomach cancer Neg Hx     Allergies  Allergen Reactions   Ativan  [Lorazepam ]     Agitation/paradoxical reaction.   Oxycodone-Acetaminophen  Nausea Only   Latex Hives and Rash    Current Outpatient Medications on File Prior to Visit  Medication Sig Dispense Refill   Acetylcysteine (NAC) 500 MG CAPS Take 500 mg by mouth daily.     amphetamine -dextroamphetamine  (ADDERALL) 10 MG tablet Take 1 tablet (10 mg total) by mouth daily with breakfast. This dose is decreased as before admission. 30 tablet 0   amphetamine -dextroamphetamine  (ADDERALL) 10 MG tablet Take 1 tablet (10 mg total) by mouth daily  with breakfast. This dose is decreased as before admission. 30 tablet 0   amphetamine -dextroamphetamine  (ADDERALL) 10 MG tablet Take 1 tablet (10 mg total) by mouth daily with breakfast. This dose is decreased as before admission. 30 tablet 0   Ascorbic Acid (VITAMIN C) 1000 MG tablet Take 1,000 mg by mouth daily.     aspirin  EC 81 MG tablet Take 81 mg by mouth daily. Swallow whole.     atorvastatin  (LIPITOR) 40 MG tablet Take 0.5 tablets (20 mg total) by mouth daily. 90 tablet 3   Bioflavonoid Products (QUERCETIN COMPLEX IMMUNE PO) Take 500 mg by mouth daily.     Cholecalciferol (D3-50 PO) Take 1 capsule by mouth daily.     Evening Primrose Oil 1000 MG CAPS Take 1,000 mg by mouth in the morning and at bedtime.     ezetimibe  (ZETIA ) 10 MG tablet Take 1 tablet (10 mg total) by mouth at bedtime. 90 tablet 3   hydrochlorothiazide  (HYDRODIURIL ) 25 MG tablet TAKE 1 TABLET BY MOUTH DAILY 90 tablet 1   ipratropium (ATROVENT ) 0.06 % nasal spray Place 2 sprays into both nostrils 2 (two) times daily as needed. 15 mL 12   losartan  (COZAAR ) 25 MG tablet Take  0.5 tablets (12.5 mg total) by mouth daily. 45 tablet 3   metoprolol  succinate (TOPROL -XL) 25 MG 24 hr tablet Take 1 tablet (25 mg total) by mouth daily. 90 tablet 3   SYNTHROID  75 MCG tablet Take 1 tablet (75 mcg total) by mouth daily. 90 tablet 3   vitamin B-12 (CYANOCOBALAMIN ) 1000 MCG tablet Take 1,000 mcg by mouth 2 (two) times a week.     Zinc 20 MG CAPS Take 20 mg by mouth daily.     No current facility-administered medications on file prior to visit.    BP 120/60   Pulse 60   Temp (!) 97.5 F (36.4 C) (Oral)   Ht 4' 11 (1.499 m)   Wt 110 lb (49.9 kg)   SpO2 99%   BMI 22.22 kg/m       Objective:   Physical Exam Vitals and nursing note reviewed.  Constitutional:      Appearance: Normal appearance.  Skin:    General: Skin is warm and dry.     Comments: No bald patches noted on scalp. There was no erythema or eczema noted. Hair pull test was negative for hair loss.   Neurological:     Mental Status: She is alert.  Psychiatric:        Mood and Affect: Mood normal.        Behavior: Behavior normal.        Thought Content: Thought content normal.        Judgment: Judgment normal.       Assessment & Plan:  1. Hair loss (Primary) - Check TSH and iron for reassurance today.  I am okay with the patient decreasing her dose of Lipitor to 20 mg daily - TSH; Future - IBC + Ferritin; Future  Darleene Shape, NP

## 2024-01-31 NOTE — Patient Instructions (Signed)
 Medication Instructions:  Stop losartan  Decrease Torpolol to 12.5 mg once a day *If you need a refill on your cardiac medications before your next appointment, please call your pharmacy*  Lab Work: Today we are going to draw a Bmet and H&H If you have labs (blood work) drawn today and your tests are completely normal, you will receive your results only by: MyChart Message (if you have MyChart) OR A paper copy in the mail If you have any lab test that is abnormal or we need to change your treatment, we will call you to review the results.  Testing/Procedures: No testing  Follow-Up: At [facility], you and your health needs are our priority.  As part of our continuing mission to provide you with exceptional heart care, our providers are all part of one team.  This team includes your primary Cardiologist (physician) and Advanced Practice Providers or APPs (Physician Assistants and Nurse Practitioners) who all work together to provide you with the care you need, when you need it.  Your next appointment:   6 month(s)  Provider:   Vina Gull, MD    We recommend signing up for the patient portal called MyChart.  Sign up information is provided on this After Visit Summary.  MyChart is used to connect with patients for Virtual Visits (Telemedicine).  Patients are able to view lab/test results, encounter notes, upcoming appointments, etc.  Non-urgent messages can be sent to your provider as well.   To learn more about what you can do with MyChart, go to [email or web address].   Other Instructions Please check your blood pressure and heart rate once per day and send us  a log in 1 week.

## 2024-02-01 ENCOUNTER — Ambulatory Visit: Payer: Self-pay | Admitting: Physician Assistant

## 2024-02-01 LAB — BASIC METABOLIC PANEL WITH GFR
BUN/Creatinine Ratio: 33 — AB (ref 12–28)
BUN: 19 mg/dL (ref 8–27)
CO2: 21 mmol/L (ref 20–29)
Calcium: 10.3 mg/dL (ref 8.7–10.3)
Chloride: 100 mmol/L (ref 96–106)
Creatinine, Ser: 0.57 mg/dL (ref 0.57–1.00)
Glucose: 91 mg/dL (ref 70–99)
Potassium: 4 mmol/L (ref 3.5–5.2)
Sodium: 140 mmol/L (ref 134–144)
eGFR: 95 mL/min/1.73 (ref >59)

## 2024-02-01 LAB — HEMOGLOBIN AND HEMATOCRIT, BLOOD
Hematocrit: 43.6 % (ref 34.0–46.6)
Hemoglobin: 14.2 g/dL (ref 11.1–15.9)

## 2024-02-02 ENCOUNTER — Ambulatory Visit

## 2024-02-02 DIAGNOSIS — R4701 Aphasia: Secondary | ICD-10-CM

## 2024-02-02 DIAGNOSIS — R41841 Cognitive communication deficit: Secondary | ICD-10-CM | POA: Diagnosis not present

## 2024-02-02 NOTE — Therapy (Signed)
 OUTPATIENT SPEECH LANGUAGE PATHOLOGY TREATMENT/RECERTIFICATION   [name] MRN: [id number] DOB:1950/09/06, 73 y.o., female Today's Date: 02/02/2024  PCP: Merna Huxley, NP REFERRING PROVIDER: Carilyn Prentice BRAVO, MD  END OF SESSION:  End of Session - 02/02/24 1018     Visit Number 10    Number of Visits 17    Date for SLP Re-Evaluation 03/20/24    SLP Start Time 0935    SLP Stop Time  1018    SLP Time Calculation (min) 43 min    Activity Tolerance Patient tolerated treatment well               Past Medical History:  Diagnosis Date   Acute combined systolic and diastolic heart failure 10/11/2023   ADHD (attention deficit hyperactivity disorder) 05/09/2007   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; multiple punctate foci of restricted diffusion in bilateral frontal lobes and the left parietal lobe   Diverticulitis of colon (without mention of hemorrhage) 09/17/2013   Essential hypertension 10/09/2017   HFrEF (heart failure with reduced ejection fraction) 10/10/2023   History of COVID-19 2021   Hyperlipidemia    Hypothyroidism    Low back pain radiating to right leg 10/09/2017   Mild cognitive impairment of uncertain or unknown etiology 08/04/2022   Mild intermittent asthma with acute exacerbation 05/30/2017   Mild neurocognitive disorder due to multiple etiologies 12/19/2023   NSVT (nonsustained ventricular tachycardia) 10/12/2023   Osteoarthritis    Postmenopausal atrophic vaginitis, severe 08/18/2008   Right knee pain 03/13/2014   S/P arthroscopy of right knee 06/03/2014   Vitamin B12 deficiency    Past Surgical History:  Procedure Laterality Date   ABDOMINAL HYSTERECTOMY     BREAST SURGERY     CARPAL TUNNEL RELEASE Bilateral    CERVICAL DISC ARTHROPLASTY     CESAREAN SECTION     x2   left shoulder surgery     LOOP RECORDER INSERTION N/A 10/12/2023   Procedure: LOOP RECORDER INSERTION;  Surgeon: Cindie Ole DASEN, MD;  Location: [facility];  Service: Cardiovascular;  Laterality: N/A;   MENISCUS REPAIR Right 2017   REDUCTION MAMMAPLASTY Bilateral    right shoulder     TONSILLECTOMY     Patient Active Problem List   Diagnosis Date Noted   Mild neurocognitive disorder due to multiple etiologies 12/19/2023   NSVT (nonsustained ventricular tachycardia) 10/12/2023   Acute combined systolic and diastolic heart failure 10/11/2023   HFrEF (heart failure with reduced ejection fraction) 10/10/2023   CVA (cerebral vascular accident) 10/08/2023   Mild cognitive impairment of uncertain or unknown etiology 08/04/2022   Vitamin B12 deficiency    Osteoarthritis 08/03/2022   Hyperlipidemia 08/03/2022   Essential hypertension 10/09/2017   Low back pain radiating to right leg 10/09/2017   Mild intermittent asthma with acute exacerbation 05/30/2017   S/P arthroscopy of right knee 06/03/2014   Right knee pain 03/13/2014   Diverticulitis of colon (without mention of hemorrhage) 09/17/2013   Postmenopausal atrophic vaginitis, severe 08/18/2008   Hypothyroidism 05/09/2007   ADHD (attention deficit hyperactivity disorder) 05/09/2007   Speech Therapy Progress Note  Dates of Reporting Period: 12/07/23 to present  Subjective Statement: Pt has been seen for 10 ST visits targeting cognition and aphasia.  Objective: Pt's attention has improved and husband has demonstrated better cueing methods for pt's anomia. Pt cont to experience anomia but improved from 12/07/23. She is more confident speaking now than at evaluation date.  Goal Update: See  below. Note pt has 10 visits in 7-8 weeks, and at time of eval 15-16 were expected by this time.  Plan: See below.  Reason Skilled Services are Required: Pt has not yet reached max potential.    ONSET DATE:  10/08/23  REFERRING DIAG: P30.679 (ICD-10-CM) - Aphasia as late effect of stroke  THERAPY DIAG:  Aphasia  Cognitive communication  deficit  Rationale for Evaluation and Treatment: Rehabilitation  SUBJECTIVE:   SUBJECTIVE STATEMENT: Pt reports occasional anomia and husband reminds her it occurs at least several times daily.     Pt accompanied by: self   PERTINENT HISTORY: HPI: Pt presented to [facility] ED on 10/08/2023 with sudden onset of left facial droop and left-sided weakness. ADHD, HTN, diverticulitis, HLD, hypothyroidism, low back pain, neck fusion, mild cognitive impairment (dx Jan 2024), cervical disc arthroplasty, OA in R knee, asthma   PAIN:  Are you having pain? No  FALLS: Has patient fallen in last 6 months?  No  PATIENT GOALS: Improve language function, memory  OBJECTIVE:  Note: Objective measures were completed at Evaluation unless otherwise noted.  DIAGNOSTIC FINDINGS:  MRI 10/09/23 IMPRESSION: 1. Findings compatible with multiple punctate acute infarcts in bilateral frontal and left parietal lobes. Given involvement of multiple vascular territories, consider an embolic etiology. 2. Significantly motion limited study.   Date of Discharge from SLP service:October 19, 2023 Clinical Impression/Discharge Summary: Pt has made great gains and has met 4 of 6 LTG's this admission due to improved cognition and language. Pt is currently an overall mod I A for orientation tasks, though requires up to [facility] for attention. Patient requires mod I A for expressive language, though continues to require occasional min-modA for complex receptive language tasks.  Pt/family education complete and pt will discharge home with 24 hour supervision from friends/family/etc. Pt would benefit from OP f/u ST services to maximize cognition and communication in order to maximize functional independence.     STANDARDIZED ASSESSMENTS:   Cognitive Linguistic Quick Test  AGE - 70-89  The Cognitive Linguistic Quick Test (CLQT) was administered to assess the relative status of five cognitive domains: attention, memory, language,  executive functioning, and visuospatial skills. Scores from 10 tasks were used to estimate severity ratings (standardized for age groups 18-69 years and 70-89 years) for each domain, a clock drawing task, as well as an overall composite severity rating of cognition.     Task Score Criterion Cut Scores  Personal Facts 8/8 8  Symbol Cancellation 12/12 10  Confrontation Naming 10/10 10  Clock Drawing  12/13 11  Story Retelling 8/10 5  Symbol Trails 10/10 6  Generative Naming 3/9 4  Design Memory 4/6 4  Mazes  4/8 4  Design Generation 3/13 5   Cognitive Domain Composite Score Severity Rating  Attention 181/215 WNL  Memory 147/185 WNL  Executive Function 20/40 Low-WNL  Language 29/37 Low-WNL  Visuospatial Skills 75/105 WNL  Clock Drawing  12/13 WNL  Composite Severity Rating  WNL     PATIENT REPORTED OUTCOME MEASURES (PROM): Communication Effectiveness Survey: 12/20/23 - pt scored herself 18/32, with higher scores indicating less of a negative effect of pt's deficits on daily life and QOL.  TREATMENT DATE:  Semantic Feature Analysis (SFA)  02/02/24: SLP worked with pt today with word finding using divergent naming tasks with salient categories for pt. SLP req'd to provide mod cues occasionally for pt's success. SLP wonders if pt's premorbid memory deficit/MCI is impeding pt progress with anomia. SLP instructed pt to cont SFA and naming tasks at home. Pt completing word/language tasks with aphasia book at home, regularly.  01/23/24: Bunnie required incr'd cue level from [place] as steps increased for her homework. Today SLP with long discussion what pt could complete with husband at home to work with china attention and organization. Suggested cooking, baking, or organizing pantry or junk drawer in the kitchen, with steps written out in an organizational manner  prior to beginning her work.  SLP also discussed with pt and [name] about pt keeping her own calendar at this time, with [name] assisting PRN. SLP expained that over time pt would do this independently but at this time she will need George's assistance and that this was a stepping stone to her eventually keeping her own calendar independently like she did prior to CVA.   01/08/24: Pt with 3 hour visit this morning from sister, and brother (from [facility]). Today SLP helped pt with occasional mod A with problem solving/sequencing task. She req'd extra time and redirection at times with 13-step sequencing task. Took pt 17 minutes for this task. Level of fatigue at which pt entered may have played a role.  [name] had question about 6-step sequencing tasks- he could not find any online. SLP suggested he and pt think through some everyday tasks and write down 6 steps in them and SLP provided 7-8 examples.  01/03/24: Pt brought homework in completed. She enjoyed sequencing task. With simple problem solving tasks pt req'd rare min A, but performance was functional.  Her speech today was WNL rate, without anomia. When she has anomia, she uses synonym or description strategy. SLP observed pt to use description strategy in previous sessions. Homework provided.  01/01/24: Pt did not complete any SFA since last session. SLP told pt to complete when she has anomia with a noun. Pt brought homework with her completed. She finished 40 wrtiten definitions in approx 90 minutes. Today SLP targeted simple problem solving with 5-step sequencing task. Pt took almost 10 minutes to complete 4 sequences. She was provided with homework until next session.  12/28/23: Pt brought in books that daughter bought her. SLP to look at these and star pages SLP thinks would be helpful for pt. SLP worked with pt today with SFA - grill. She req'd mod A occasionally for procedure and for more accurate wording, especially description. SLP provided homework  for description.  12/25/23:  Minimal difficulty with letter fill in homework. Focusing on problem solving in  cognitive linguistics today, SLP assisted pt with scrambled sentences.Four to 5-word sentences were completed with 4/4 success. When pt worked on 7-9 word sentences with SLP she had intermittent difficulty with mental flexibility with word choice. SLP provided mod cues faded to min nonverbal cues. Pt's attention/memory deficit made alternating attention difficult at times so SLP suggested pt write the word order above the words and this helped pt compensate for decr'd alternating attention.  SLP provided compensations for anomia for pt and educated her and [name] about these.   12/20/23: PROM provided today, with score above. [name] cueing pt about the accuracy of her answers. Needs anomia compensations next session. SLP asked [name] to fill out a CES as well  for SLP to compare. Pt completed 3 clues task with 90% success, however only named average 2 items in a common category for divergent naming prior to requiring consistent mod-max  A. SLP provided letter fill ins for homework, and divergent naming for flowers in her yard, and for sports.  12/14/23: PT NEEDS PROM NEXT SESSION. SLP completed CLQT today. From pt's CLQT score it would indicate that although pt's Composite Score is WNL she had difficulty with processing speed (mazes, design generation), problem solving Forensic psychologist), memory Training and development officer memory), and language (generative naming). Goals added.   12/07/23: SLP educated pt and husband [name]) with describing phonemic cues, as this appeared to help pt during BNT. SLP provided examples during BNT and told [name] to assist pt in this way if he is sure of the word pt means to say. Secondly, SLP educated pt and husband on how to complete semantic feature analysis (SFA) at home with words/nouns pt experiences anomia with. SLP explained benefits of SFA to pt and husband. [name] took notes during  session. SLP provided handouts for this framework.  PATIENT EDUCATION: Education details: see treatment date Person educated: Patient and Spouse Education method: Medical illustrator Education comprehension: verbalized understanding and needs further education   GOALS: Goals reviewed with patient? Yes in general  SHORT TERM GOALS: Target date: 01/05/24  Pt will complete cognitive linguistic assessment in first 2 sessions  Baseline: Goal status: met  2.  Pt will successfully use anomia compensations with min A to do so, for 10 minutes functional simple conversation in 2 sessions Baseline:  Goal status: partially met  3.  Pt will demo how to use SFA with rare min A in 3 sessions Baseline: 01/03/24 Goal status: partially met  4.  Pt will demo [facility] problem solving skills in simple cognitive linguistic tasks in 2 sessions Baseline: 01/03/24 Goal status: partially met   LONG TERM GOALS: Target date:  03/20/24  Pt will improve PROM compared to initial administration Baseline:  Goal status: INITIAL  2.  Pt will participate in 10 minutes mod complex conversation with functional expressive language (using anomia compensations) in 3 sessions Baseline:  Goal status: INITIAL  3.  Pt will demo functional problem solving skills in practical cognitive linguistic tasks in 3 sessions Baseline:  Goal status: INITIAL   ASSESSMENT:  CLINICAL IMPRESSION: RECERT TODAY. Pt did not meet any LTGs, partly due to visit number (cert date arrived prior to pt's planned number of visits). Patient is a 73 y.o. F who was seen today for treatment of aphasia and problem solving in light of bilateral frontal CVA, and parietal CVA. See treatment date above for today's date for further details on today's session. Given her premorbid dx of ADHD and MCI and OT focus on other areas identified on CLQT, SLP will focus on problem solving in functional cognitive linguistic tasks. She also demonstrated  s/sx of cognitive linguistic impairment requiring a full evaluation in the first 2 sessions. Pt with premorbid dx of ADHD and MCI.   OBJECTIVE IMPAIRMENTS: include attention, memory, awareness, and aphasia. These impairments are limiting patient from managing medications, managing appointments, managing finances, household responsibilities, ADLs/IADLs, and effectively communicating at home and in community. Factors affecting potential to achieve goals and functional outcome are ability to learn/carryover information and previous level of function. Patient will benefit from skilled SLP services to address above impairments and improve overall function.  REHAB POTENTIAL: Good  PLAN:  SLP FREQUENCY: 2x/week  SLP DURATION: 17 sessions total  PLANNED INTERVENTIONS: Language facilitation, Environmental controls, Cueing hierachy, Cognitive reorganization, Internal/external aids, Functional tasks, Multimodal communication approach, SLP instruction and feedback, Compensatory strategies, Patient/family education, [phone] Treatment of speech (30 or 45 min) , and [id number] (Standardized cognitive assessment)    Daimon Kean, CCC-SLP 02/02/2024, 1:03 PM

## 2024-02-06 NOTE — Progress Notes (Signed)
 [facility] Summary Report / Loop Recorder

## 2024-02-07 ENCOUNTER — Telehealth: Payer: Self-pay | Admitting: Physician Assistant

## 2024-02-07 NOTE — Telephone Encounter (Signed)
 I discussed Ms. Ghattas's case with Dr. Okey, specifically the question of her opinion on this patient continuing her Adderall given her cardiovascular history. She had reported at recent OV being on this since being a young mother, maintained on 10mg  daily by primary care. Dr. Okey relays some concern in feeling conflicted about the safely of continuing meanwhile acknowledging that she has been on a low dose long term. 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 Dr. Okey recommended for prescribing provider to weigh risks/benefits and consider non-stimulant ADHD medications if feasible given the updates to her health this year. I will relay to [employer], for review and advisement.  Katherene Dinino, PA with [facility]

## 2024-02-09 NOTE — Telephone Encounter (Signed)
 Left detailed message informing  of update.

## 2024-02-12 ENCOUNTER — Ambulatory Visit: Admitting: Occupational Therapy

## 2024-02-12 DIAGNOSIS — I69318 Other symptoms and signs involving cognitive functions following cerebral infarction: Secondary | ICD-10-CM

## 2024-02-12 DIAGNOSIS — R41841 Cognitive communication deficit: Secondary | ICD-10-CM | POA: Diagnosis not present

## 2024-02-12 DIAGNOSIS — R41840 Attention and concentration deficit: Secondary | ICD-10-CM

## 2024-02-12 NOTE — Therapy (Signed)
 OUTPATIENT OCCUPATIONAL THERAPY NEURO Treatment  [name] MRN: [id number] DOB:February 05, 1951, 73 y.o., female Today's Date: 02/12/2024  PCP: Merna Huxley, NP REFERRING PROVIDER: Carilyn Prentice BRAVO, MD  END OF SESSION:  OT End of Session - 02/12/24 1357     Visit Number 11    Number of Visits 16    Date for OT Re-Evaluation 03/04/24    Authorization Type [employer]    OT Start Time 1318    OT Stop Time 1400    OT Time Calculation (min) 42 min    Activity Tolerance Patient tolerated treatment well    Behavior During Therapy WFL for tasks assessed/performed                   Past Medical History:  Diagnosis Date   Acute combined systolic and diastolic heart failure 10/11/2023   ADHD (attention deficit hyperactivity disorder) 05/09/2007   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; multiple punctate foci of restricted diffusion in bilateral frontal lobes and the left parietal lobe   Diverticulitis of colon (without mention of hemorrhage) 09/17/2013   Essential hypertension 10/09/2017   HFrEF (heart failure with reduced ejection fraction) 10/10/2023   History of COVID-19 2021   Hyperlipidemia    Hypothyroidism    Low back pain radiating to right leg 10/09/2017   Mild cognitive impairment of uncertain or unknown etiology 08/04/2022   Mild intermittent asthma with acute exacerbation 05/30/2017   Mild neurocognitive disorder due to multiple etiologies 12/19/2023   NSVT (nonsustained ventricular tachycardia) 10/12/2023   Osteoarthritis    Postmenopausal atrophic vaginitis, severe 08/18/2008   Right knee pain 03/13/2014   S/P arthroscopy of right knee 06/03/2014   Vitamin B12 deficiency    Past Surgical History:  Procedure Laterality Date   ABDOMINAL HYSTERECTOMY     BREAST SURGERY     CARPAL TUNNEL RELEASE Bilateral    CERVICAL DISC ARTHROPLASTY     CESAREAN SECTION     x2   left shoulder surgery     LOOP  RECORDER INSERTION N/A 10/12/2023   Procedure: LOOP RECORDER INSERTION;  Surgeon: Cindie Ole DASEN, MD;  Location: [facility];  Service: Cardiovascular;  Laterality: N/A;   MENISCUS REPAIR Right 2017   REDUCTION MAMMAPLASTY Bilateral    right shoulder     TONSILLECTOMY     Patient Active Problem List   Diagnosis Date Noted   Mild neurocognitive disorder due to multiple etiologies 12/19/2023   NSVT (nonsustained ventricular tachycardia) 10/12/2023   Acute combined systolic and diastolic heart failure 10/11/2023   HFrEF (heart failure with reduced ejection fraction) 10/10/2023   CVA (cerebral vascular accident) 10/08/2023   Mild cognitive impairment of uncertain or unknown etiology 08/04/2022   Vitamin B12 deficiency    Osteoarthritis 08/03/2022   Hyperlipidemia 08/03/2022   Essential hypertension 10/09/2017   Low back pain radiating to right leg 10/09/2017   Mild intermittent asthma with acute exacerbation 05/30/2017   S/P arthroscopy of right knee 06/03/2014   Right knee pain 03/13/2014   Diverticulitis of colon (without mention of hemorrhage) 09/17/2013   Postmenopausal atrophic vaginitis, severe 08/18/2008   Hypothyroidism 05/09/2007   ADHD (attention deficit hyperactivity disorder) 05/09/2007    ONSET DATE: 10/08/23  REFERRING DIAG: I63.9 (ICD-10-CM) - Cerebral infarction, unspecified  THERAPY DIAG:  Attention and concentration deficit  Other symptoms and signs involving cognitive functions following cerebral infarction  Rationale for Evaluation and Treatment: Rehabilitation  SUBJECTIVE:   SUBJECTIVE STATEMENT: Pt  reports that her vision comes and goes.  Pt brought in [employer] to show to therapist -reporting they have been playing to work on recall and sequencing.  Pt accompanied by: self and spouse  PERTINENT HISTORY: ADHD, HTN, diverticulitis, HLD, hypothyroidism, low back pain, neck fusion, mild cognitive impairment, OA in R knee, asthma   presented  to [facility] ED on 10/08/2023 with sudden onset of left facial droop, left-sided weakness,and L visual cut. MRI: Multiple punctate acute infarcts bilateral frontal and left parietal lobes. EEG suggestive of moderate diffuse encephalopathy.   PRECAUTIONS: Fall  WEIGHT BEARING RESTRICTIONS: No  PAIN:  Are you having pain? No pain  FALLS: Has patient fallen in last 6 months? No  LIVING ENVIRONMENT: Lives with: lives with their spouse Lives in: House/apartment Stairs: Yes: Internal: bedroom/bathroom on main floor, does have a full flight of steps - spouse's office is upstairs  steps; one rail and External: 2 steps;   Has following equipment at home: shower chair and hand held shower head  PLOF: Independent, Independent with basic ADLs, and Leisure: walking up to 4-6 hours per day  PATIENT GOALS: I really don't know  OBJECTIVE:  Note: Objective measures were completed at Evaluation unless otherwise noted.  HAND DOMINANCE: Right  ADLs: Overall ADLs: Mod I with bathing and dressing, pt's spouse will go in and out of bathroom while pt in shower providing distant supervision to Mod I level Transfers/ambulation related to ADLs: Mod I without AD, getting into high bed without any issue  IADLs: Shopping: pt gets overwhelmed with everything that is going on, easily distracted  Meal Prep: spouse is doing most of the cooking right now as pt forgetful with sequencing and ingredients, and easily distracted Community mobility: not cleared to drive at this time Medication management: spouse is assisting now, he will fill up a daily pill box for pt   MOBILITY STATUS: Mod I without AD  POSTURE COMMENTS:  No Significant postural limitations  ACTIVITY TOLERANCE: Activity tolerance: pt reports worn out after 30 min walk (where as they would walk up to 2 hrs at a time before stroke).  Pt is not sleeping as much during the day time.  FUNCTIONAL OUTCOME MEASURES: 9 hole peg test: Right: 36.54 sec;  Left: 34.06 sec Trail Making: 42.47 sec for Trail A   UPPER EXTREMITY ROM:  WFL bilaterally  UPPER EXTREMITY MMT:     MMT Right eval Left eval  Shoulder flexion 4+ 4-  Shoulder abduction    Shoulder adduction    Shoulder extension    Shoulder internal rotation    Shoulder external rotation    Middle trapezius    Lower trapezius    Elbow flexion 4 4  Elbow extension 4- 4-  Wrist flexion    Wrist extension    Wrist ulnar deviation    Wrist radial deviation    Wrist pronation    Wrist supination    (Blank rows = not tested)  COORDINATION: 9 Hole Peg test: Right: 36.54 sec; Left: 34.06 sec Box and Blocks:  Right 43 blocks, Left 36blocks - Pt requiring cues x3 to remember to only pick up one block at a time when completing with Right; requiring cues to go over barrier instead of around (possibly slowing down pace even more).  SENSATION: WFL  COGNITION: Overall cognitive status: Impaired and History of cognitive impairments - at baseline Pt able to recall 3 words: sock blue bed after 5 mins  Pt with difficulty with word finding and  memory  VISION: Subjective report: wears glasses, reported f/u with neurology and ophthalmologist who report to wait for further assessment to allow recovery s/p CVA Baseline vision: Wears glasses all the time  VISION ASSESSMENT: To be further assessed in functional context Eye alignment: WFL Ocular ROM: WFL Gaze preference/alignment: WDL Tracking/Visual pursuits: TBA Visual Fields: TBA in functional context   Bell cancellation test: locating 32/35 in 3:00, pt then able to locate remaining 2/3 in 4:20 before stating I think that is all  PERCEPTION: Impaired  PRAXIS: Impaired: Initiation, Motor planning, and Organization  OBSERVATIONS: Difficult to assess vision, attention, and strength as pt requiring increased cueing and repeat directions.  Unsure full attention and effort during MMT this session.  Pt forgetting instructions during  coordination assessment, requiring wait time for recall and/or additional cues.                                                                                                                             TREATMENT DATE:  02/12/24 Therapeutic activity: engaged in [place] challenging pt to explain how to play the game.  Pt able to provide instructions for initial portion of game, however having difficulty with recall of 2 steps at midway point of game.  Pt then fixating on previous instructions to attempt to recall next instruction.  OT providing pt with question cues, however pt unable to recall even when given next instruction.   Therapeutic activity: engaged in mobility in clinic with focus on recall of 2-3 step directions.  Pt initially completing first 3 step sequencing task, however selecting incorrect amount of a particular item.  Reviewed instructions with pt unable to recall correct number initially stated.  Pt with good sequencing of items, however with incorrect recall of amounts.  Completed additional 3 steps command with more functional context with pt demonstrating improved recall of steps and sequence.  OT educating pt on writing things down, especially when needing to remember specific numbers/ amounts.   Recall: OT instructing pt in Speed card game with focus on ability to recall sequencing, instructions, and complete task with alternating attention (as therapist playing simultaneously impacting pt's outcomes).  Pt demonstrating good sequencing during turns, however demonstrating decreased recall of final instructions.  OT encouraged pt to attempt game at home with spouse, and will plan to assess recall at next session.    01/29/24 Trail Making: Trail A: 37.37 sec and Trail B: 3:05.  Pt requiring significantly increased time to alternate attention between numbers and letters.  OT educating on functional carryover of task with encouragement to focus on single tasks at a time and assess  ability to add additional task.  Educated on awareness with pt and spouse on when to add and when to remove second task to challenge attention.   Sequencing: pt brought in sequencing homework with good completion.  Pt stating that she did well with all of the steps listed out.  Therefore OT challenging pt to verbally  list steps of 2 cooking tasks.  Pt with good sequencing, however not specific in details.   Phone use: pt and spouse still reporting decreased use of personal cell phone, especially when responding to text messages.  OT had pt use phone to call spouse and then to send text message.  Pt demonstrating good use of short cuts with spouse's contact as icon on phone as well as use of predictive text to aid in speed and thinking with texting.  OT encouraged pt to attempt to respond when receiving a text and talk it off if needed prior to responding.  OT encouraged pt to remove extra apps from phone to decrease confusion when navigating to most used apps.    01/10/24 Logic puzzles: engaged in sequencing and problem solving organization of daily task prompt.  Pt demonstrating improved ease with more simple prompts compared to more complex prompts.  OT breaking down prompt to facilitate increased ease and sequencing, including listing tasks and having pt place numbers to organize.  OT utilizing who, what, when, where, why prompts to answer some complex problem solving questions.  OT also educating on energy conservation tasks to break up tasks across days to increase success if multiple tasks.  OT educated on 4P's of energy conservation (planning, prioritizing, pacing, and positioning) and providing cues and examples for each.   Pill box assessment.  Pt completed with 100% accuracy in 6:17.  Pt with no errors, however requiring increased time for organization and sequencing.  PT demonstrating good technique by flipping over pill bottles after dispensing to increase recall.  OT reiterating use of  recall/memory strategies to increase recall, recommending use of checks and balances.  OT encouraged pt to take a more active roll in filling out pill box at home as well as other familiar tasks, with supervision, to continue to address organization, sequencing, and recall.    PATIENT EDUCATION: Education details: see today's tx above Person educated: Patient and Spouse Education method: Explanation and Handouts Education comprehension: verbalized understanding and needs further education  HOME EXERCISE PROGRAM: Provided written suggestions for keeping thinking skills sharp - plan to provide printout at future session 12/13/23 - memory compensation strategies and activities to keep thinking skills sharp (see pt instructions)   GOALS: Goals reviewed with patient? Yes  SHORT TERM GOALS: Target date: 12/15/23  Pt will be independent with coordination HEP with visual handouts for recall. Baseline: new to OPOT Goal status: in progress  2.  Pt will demonstrate understanding of memory compensations and ways to keep thinking skills sharp Baseline: STM deficits and impaired initiation 01/08/24 - pt utilizing writing information down,  WARM strategy, and engaging in logic puzzles with assistance from spouse for attention, sequencing, and deductive reasoning.   Goal status: MET  3.  Pt will demonstrate and/or verbalize understanding of visual attention exercises and compensatory strategies for improved visual scanning and attention. Baseline: left field cut 01/08/24 - pt and spouse report that this does not seem to be an area of concern any longer Goal status: MET, however would benefit from f/u  4.  Pt will demonstrate improved coordination, attention to task, and recall of instructions by completing box and blocks assessment with improved score by 6 blocks bilaterally. Baseline: Right 43 blocks, Left 36blocks - Pt requiring cues x3 to remember to only pick up one block at a time when completing  with Right; requiring cues to go over barrier instead of around (possibly slowing down pace even more). 01/08/24: Right 41  blocks, Left 42 blocks - Pt demonstrating improved recall of instructions with only initial instruction Goal status: partially met: not met on RUE, met with LUE    LONG TERM GOALS: Target date: 03/04/24  Pt will verbalize understanding of task modifications, adaptive strategies, and/or potential AE needs to increase ease, safety, and independence w/ IADLs. Baseline: decreased engagement in IADLs due to impaired memory and attention Goal status: in progress  2.  Pt will complete simulated medication management activity w/ Supervision by discharge, incorporating compensatory strategies/AE prn Baseline: spouse managing meds at this time, was not prior to CVA Goal status: in progress  3.  Pt will demonstrate ability to sequence simple functional task (simple snack prep, laundry task, etc) at Mod I level with good safety awareness and ability to utilize memory strategies for recall. Baseline:  Goal status: in progress  4.  Pt will navigate a moderately busy environment, completing dual task activity and/or following multi-step commands with 90% accuracy Baseline:  Goal status: in progress  5.  Pt will demonstrate improved initiation and sequencing to be able to navigate use of personal cell phone and send text messages in <15 mins. Baseline: spouse reporting 45 mins to reply to test message Goal status: in progress   ASSESSMENT:  CLINICAL IMPRESSION: Pt continues to demonstrate difficulty with alternating attention and multi-step commands - especially with amounts.  Pt is an active participant in cognitive retraining activities and sequencing, reporting enjoying more dynamic activities this session.  Pt and spouse receptive to education on continued dual tasking and/or incorporation of cognitive challenges into familiar, routine tasks to continue to focus on sequencing  and problem solving. Pt will continue to benefit from additional skilled occupational therapy visits to allow for further focus on sequencing, recall, deductive reasoning with functional tasks to increase participation in IADLs.   PERFORMANCE DEFICITS: in functional skills including ADLs, IADLs, coordination, strength, Fine motor control, Gross motor control, endurance, decreased knowledge of precautions, vision, and UE functional use, cognitive skills including attention, energy/drive, memory, problem solving, safety awareness, and sequencing, and psychosocial skills including environmental adaptation and routines and behaviors.     PLAN:  OT FREQUENCY: 1-2x/week  OT DURATION: 6 weeks (asking 8 weeks for scheduling)  PLANNED INTERVENTIONS: 02831 OT Re-evaluation, 97535 self care/ADL training, 02889 therapeutic exercise, 97530 therapeutic activity, 97112 neuromuscular re-education, functional mobility training, visual/perceptual remediation/compensation, psychosocial skills training, energy conservation, coping strategies training, patient/family education, and DME and/or AE instructions  RECOMMENDED OTHER SERVICES: SLP  CONSULTED AND AGREED WITH PLAN OF CARE: Patient and family member/caregiver  PLAN FOR NEXT SESSION:   Start with Speed card game  Cognitive/motor dual tasking, sequencing, recall of 2-3 step directions  Phone use  Further assess during functional tasks, review PRN visual compensatory strategies (especially during functional tasks)  Visual scanning tasks  Sequencing tasks    KAYLENE DOMINO, OTR/L 02/12/2024, 4:54 PM  [facility] at [facility] [address] [place], [place] [address] Phone # [phone] Fax # [phone]

## 2024-02-13 ENCOUNTER — Ambulatory Visit

## 2024-02-13 ENCOUNTER — Telehealth (INDEPENDENT_AMBULATORY_CARE_PROVIDER_SITE_OTHER): Admitting: Adult Health

## 2024-02-13 VITALS — Ht 59.0 in | Wt 109.0 lb

## 2024-02-13 DIAGNOSIS — F909 Attention-deficit hyperactivity disorder, unspecified type: Secondary | ICD-10-CM | POA: Diagnosis not present

## 2024-02-13 DIAGNOSIS — R41841 Cognitive communication deficit: Secondary | ICD-10-CM | POA: Diagnosis not present

## 2024-02-13 DIAGNOSIS — R4701 Aphasia: Secondary | ICD-10-CM

## 2024-02-13 MED ORDER — BUPROPION HCL ER (XL) 150 MG PO TB24: 150.0000 mg | ORAL_TABLET | Freq: Every day | ORAL | 0 refills | Status: DC

## 2024-02-13 NOTE — Therapy (Signed)
 OUTPATIENT SPEECH LANGUAGE PATHOLOGY TREATMENT   [name] MRN: [id number] DOB:[birth year], 73 y.o., female Today's Date: 02/13/2024  PCP: Merna Huxley, NP REFERRING PROVIDER: Carilyn Prentice BRAVO, MD  END OF SESSION:         Past Medical History:  Diagnosis Date   Acute combined systolic and diastolic heart failure 10/11/2023   ADHD (attention deficit hyperactivity disorder) 05/09/2007   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; multiple punctate foci of restricted diffusion in bilateral frontal lobes and the left parietal lobe   Diverticulitis of colon (without mention of hemorrhage) 09/17/2013   Essential hypertension 10/09/2017   HFrEF (heart failure with reduced ejection fraction) 10/10/2023   History of COVID-19 2021   Hyperlipidemia    Hypothyroidism    Low back pain radiating to right leg 10/09/2017   Mild cognitive impairment of uncertain or unknown etiology 08/04/2022   Mild intermittent asthma with acute exacerbation 05/30/2017   Mild neurocognitive disorder due to multiple etiologies 12/19/2023   NSVT (nonsustained ventricular tachycardia) 10/12/2023   Osteoarthritis    Postmenopausal atrophic vaginitis, severe 08/18/2008   Right knee pain 03/13/2014   S/P arthroscopy of right knee 06/03/2014   Vitamin B12 deficiency    Past Surgical History:  Procedure Laterality Date   ABDOMINAL HYSTERECTOMY     BREAST SURGERY     CARPAL TUNNEL RELEASE Bilateral    CERVICAL DISC ARTHROPLASTY     CESAREAN SECTION     x2   left shoulder surgery     LOOP RECORDER INSERTION N/A 10/12/2023   Procedure: LOOP RECORDER INSERTION;  Surgeon: Cindie Ole DASEN, MD;  Location: [facility];  Service: Cardiovascular;  Laterality: N/A;   MENISCUS REPAIR Right 2017   REDUCTION MAMMAPLASTY Bilateral    right shoulder     TONSILLECTOMY     Patient Active Problem List   Diagnosis Date Noted   Mild neurocognitive disorder due to  multiple etiologies 12/19/2023   NSVT (nonsustained ventricular tachycardia) 10/12/2023   Acute combined systolic and diastolic heart failure 10/11/2023   HFrEF (heart failure with reduced ejection fraction) 10/10/2023   CVA (cerebral vascular accident) 10/08/2023   Mild cognitive impairment of uncertain or unknown etiology 08/04/2022   Vitamin B12 deficiency    Osteoarthritis 08/03/2022   Hyperlipidemia 08/03/2022   Essential hypertension 10/09/2017   Low back pain radiating to right leg 10/09/2017   Mild intermittent asthma with acute exacerbation 05/30/2017   S/P arthroscopy of right knee 06/03/2014   Right knee pain 03/13/2014   Diverticulitis of colon (without mention of hemorrhage) 09/17/2013   Postmenopausal atrophic vaginitis, severe 08/18/2008   Hypothyroidism 05/09/2007   ADHD (attention deficit hyperactivity disorder) 05/09/2007     ONSET DATE:  10/08/23  REFERRING DIAG: P30.679 (ICD-10-CM) - Aphasia as late effect of stroke  THERAPY DIAG:  Cognitive communication deficit  Aphasia  Rationale for Evaluation and Treatment: Rehabilitation  SUBJECTIVE:   SUBJECTIVE STATEMENT: Pt reports it was challenging for pt to attend to reading homework. I kept on having to go back and go back and go back.     Pt accompanied by: self   PERTINENT HISTORY: HPI: Pt presented to [facility] ED on 10/08/2023 with sudden onset of left facial droop and left-sided weakness. ADHD, HTN, diverticulitis, HLD, hypothyroidism, low back pain, neck fusion, mild cognitive impairment (dx Jan 2024), cervical disc arthroplasty, OA in R knee, asthma   PAIN:  Are you having pain? No  FALLS: Has patient fallen in  last 6 months?  No  PATIENT GOALS: Improve language function, memory  OBJECTIVE:  Note: Objective measures were completed at Evaluation unless otherwise noted.  DIAGNOSTIC FINDINGS:  MRI 10/09/23 IMPRESSION: 1. Findings compatible with multiple punctate acute infarcts in bilateral frontal  and left parietal lobes. Given involvement of multiple vascular territories, consider an embolic etiology. 2. Significantly motion limited study.   Date of Discharge from SLP service:October 19, 2023 Clinical Impression/Discharge Summary: Pt has made great gains and has met 4 of 6 LTG's this admission due to improved cognition and language. Pt is currently an overall mod I A for orientation tasks, though requires up to [facility] for attention. Patient requires mod I A for expressive language, though continues to require occasional min-modA for complex receptive language tasks.  Pt/family education complete and pt will discharge home with 24 hour supervision from friends/family/etc. Pt would benefit from OP f/u ST services to maximize cognition and communication in order to maximize functional independence.     STANDARDIZED ASSESSMENTS:   Cognitive Linguistic Quick Test  AGE - 70-89  The Cognitive Linguistic Quick Test (CLQT) was administered to assess the relative status of five cognitive domains: attention, memory, language, executive functioning, and visuospatial skills. Scores from 10 tasks were used to estimate severity ratings (standardized for age groups 18-69 years and 70-89 years) for each domain, a clock drawing task, as well as an overall composite severity rating of cognition.     Task Score Criterion Cut Scores  Personal Facts 8/8 8  Symbol Cancellation 12/12 10  Confrontation Naming 10/10 10  Clock Drawing  12/13 11  Story Retelling 8/10 5  Symbol Trails 10/10 6  Generative Naming 3/9 4  Design Memory 4/6 4  Mazes  4/8 4  Design Generation 3/13 5   Cognitive Domain Composite Score Severity Rating  Attention 181/215 WNL  Memory 147/185 WNL  Executive Function 20/40 Low-WNL  Language 29/37 Low-WNL  Visuospatial Skills 75/105 WNL  Clock Drawing  12/13 WNL  Composite Severity Rating  WNL     PATIENT REPORTED OUTCOME MEASURES (PROM): Communication Effectiveness Survey: 12/20/23 -  pt scored herself 18/32, with higher scores indicating less of a negative effect of pt's deficits on daily life and QOL.                                                                                                                              TREATMENT DATE:  Semantic Feature Analysis (SFA)  02/13/24: Pt with discussion with PCP this afternoon about meds, Adderall is not recommended for her due to cardiac health. Pt's attention has decr'd since CVA but pt's attention med was reduced to half dose than prior to CVA. SLP took time to explain/educate pt and husband why an attention med would cont to be helpful for pt from a therapeutic perspective. SLP ascertained that pt's anomia has improved but not dramatically since her hospital d/c for CVA. SLP to cont with pt for  anomia/verbal expression and for problem solving in a functional context. Pt and husband agree with this plan.   02/02/24: SLP worked with pt today with word finding using divergent naming tasks with salient categories for pt. SLP req'd to provide mod cues occasionally for pt's success. SLP wonders if pt's premorbid memory deficit/MCI is impeding pt progress with anomia. SLP instructed pt to cont SFA and naming tasks at home. Pt completing word/language tasks with aphasia book at home, regularly.  01/23/24: Bunnie required incr'd cue level from [place] as steps increased for her homework. Today SLP with long discussion what pt could complete with husband at home to work with china attention and organization. Suggested cooking, baking, or organizing pantry or junk drawer in the kitchen, with steps written out in an organizational manner prior to beginning her work.  SLP also discussed with pt and [name] about pt keeping her own calendar at this time, with [name] assisting PRN. SLP expained that over time pt would do this independently but at this time she will need George's assistance and that this was a stepping stone to her eventually  keeping her own calendar independently like she did prior to CVA.   01/08/24: Pt with 3 hour visit this morning from sister, and brother (from [facility]). Today SLP helped pt with occasional mod A with problem solving/sequencing task. She req'd extra time and redirection at times with 13-step sequencing task. Took pt 17 minutes for this task. Level of fatigue at which pt entered may have played a role.  [name] had question about 6-step sequencing tasks- he could not find any online. SLP suggested he and pt think through some everyday tasks and write down 6 steps in them and SLP provided 7-8 examples.  01/03/24: Pt brought homework in completed. She enjoyed sequencing task. With simple problem solving tasks pt req'd rare min A, but performance was functional.  Her speech today was WNL rate, without anomia. When she has anomia, she uses synonym or description strategy. SLP observed pt to use description strategy in previous sessions. Homework provided.  01/01/24: Pt did not complete any SFA since last session. SLP told pt to complete when she has anomia with a noun. Pt brought homework with her completed. She finished 40 wrtiten definitions in approx 90 minutes. Today SLP targeted simple problem solving with 5-step sequencing task. Pt took almost 10 minutes to complete 4 sequences. She was provided with homework until next session.  12/28/23: Pt brought in books that daughter bought her. SLP to look at these and star pages SLP thinks would be helpful for pt. SLP worked with pt today with SFA - grill. She req'd mod A occasionally for procedure and for more accurate wording, especially description. SLP provided homework for description.  12/25/23:  Minimal difficulty with letter fill in homework. Focusing on problem solving in  cognitive linguistics today, SLP assisted pt with scrambled sentences.Four to 5-word sentences were completed with 4/4 success. When pt worked on 7-9 word sentences with SLP she had intermittent  difficulty with mental flexibility with word choice. SLP provided mod cues faded to min nonverbal cues. Pt's attention/memory deficit made alternating attention difficult at times so SLP suggested pt write the word order above the words and this helped pt compensate for decr'd alternating attention.  SLP provided compensations for anomia for pt and educated her and [name] about these.   12/20/23: PROM provided today, with score above. [name] cueing pt about the accuracy of her answers. Needs anomia compensations next  session. SLP asked [name] to fill out a CES as well for SLP to compare. Pt completed 3 clues task with 90% success, however only named average 2 items in a common category for divergent naming prior to requiring consistent mod-max  A. SLP provided letter fill ins for homework, and divergent naming for flowers in her yard, and for sports.  12/14/23: PT NEEDS PROM NEXT SESSION. SLP completed CLQT today. From pt's CLQT score it would indicate that although pt's Composite Score is WNL she had difficulty with processing speed (mazes, design generation), problem solving Forensic psychologist), memory Training and development officer memory), and language (generative naming). Goals added.   12/07/23: SLP educated pt and husband [name]) with describing phonemic cues, as this appeared to help pt during BNT. SLP provided examples during BNT and told [name] to assist pt in this way if he is sure of the word pt means to say. Secondly, SLP educated pt and husband on how to complete semantic feature analysis (SFA) at home with words/nouns pt experiences anomia with. SLP explained benefits of SFA to pt and husband. [name] took notes during session. SLP provided handouts for this framework.  PATIENT EDUCATION: Education details: see treatment date Person educated: Patient and Spouse Education method: Medical illustrator Education comprehension: verbalized understanding and needs further education   GOALS: Goals  reviewed with patient? Yes in general  SHORT TERM GOALS: Target date: 01/05/24  Pt will complete cognitive linguistic assessment in first 2 sessions  Baseline: Goal status: met  2.  Pt will successfully use anomia compensations with min A to do so, for 10 minutes functional simple conversation in 2 sessions Baseline:  Goal status: partially met  3.  Pt will demo how to use SFA with rare min A in 3 sessions Baseline: 01/03/24 Goal status: partially met  4.  Pt will demo [facility] problem solving skills in simple cognitive linguistic tasks in 2 sessions Baseline: 01/03/24 Goal status: partially met   LONG TERM GOALS: Target date:  03/20/24  Pt will improve PROM compared to initial administration Baseline:  Goal status: INITIAL  2.  Pt will participate in 10 minutes mod complex conversation with functional expressive language (using anomia compensations) in 3 sessions Baseline:  Goal status: INITIAL  3.  Pt will demo functional problem solving skills in practical cognitive linguistic tasks in 3 sessions Baseline:  Goal status: INITIAL   ASSESSMENT:  CLINICAL IMPRESSION: Patient is a 74 y.o. F who was seen today for treatment of aphasia and problem solving in light of bilateral frontal CVA, and parietal CVA. See treatment date above for today's date for further details on today's session. Given her premorbid dx of ADHD and MCI and OT focus on other areas identified on CLQT, SLP will focus on problem solving in functional cognitive linguistic tasks. She also demonstrated s/sx of cognitive linguistic impairment requiring a full evaluation in the first 2 sessions. Pt with premorbid dx of ADHD and MCI.   OBJECTIVE IMPAIRMENTS: include attention, memory, awareness, and aphasia. These impairments are limiting patient from managing medications, managing appointments, managing finances, household responsibilities, ADLs/IADLs, and effectively communicating at home and in community. Factors  affecting potential to achieve goals and functional outcome are ability to learn/carryover information and previous level of function. Patient will benefit from skilled SLP services to address above impairments and improve overall function.  REHAB POTENTIAL: Good  PLAN:  SLP FREQUENCY: 2x/week  SLP DURATION: 17 sessions total  PLANNED INTERVENTIONS: Language facilitation, Environmental controls, Cueing hierachy, Cognitive reorganization, Internal/external  aids, Functional tasks, Multimodal communication approach, SLP instruction and feedback, Compensatory strategies, Patient/family education, [phone] Treatment of speech (30 or 45 min) , and [id number] (Standardized cognitive assessment)    Sadie Hazelett, CCC-SLP 02/13/2024, 2:17 PM

## 2024-02-13 NOTE — Progress Notes (Signed)
 Virtual Visit via Video Note  I connected with Gabrielle Vargas on 02/13/24 at  4:00 PM EDT by a video enabled telemedicine application and verified that I am speaking with the correct person using two identifiers.  Location patient: home Location provider:work or home office Persons participating in the virtual visit: patient, provider  I discussed the limitations of evaluation and management by telemedicine and the availability of in person appointments. The patient expressed understanding and agreed to proceed.   HPI: 73 year old female who  has a past medical history of Acute combined systolic and diastolic heart failure (10/11/2023), ADHD (attention deficit hyperactivity disorder) (05/09/2007), CVA (cerebral vascular accident) (10/08/2023), Diverticulitis of colon (without mention of hemorrhage) (09/17/2013), Essential hypertension (10/09/2017), HFrEF (heart failure with reduced ejection fraction) (10/10/2023), History of COVID-19 (2021), Hyperlipidemia, Hypothyroidism, Low back pain radiating to right leg (10/09/2017), Mild cognitive impairment of uncertain or unknown etiology (08/04/2022), Mild intermittent asthma with acute exacerbation (05/30/2017), Mild neurocognitive disorder due to multiple etiologies (12/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  She has known ADHD and has been on Adderall since she was a young mother.  I received a message from her cardiology team for concern of patient being on Adderall.  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   Cardiology wanted me to talk with the patient since I am the prescriber of her Adderall about changing her to a nonstimulant form.   ROS: See pertinent positives and negatives per HPI.  Past Medical History:  Diagnosis Date   Acute combined systolic and diastolic heart failure 10/11/2023   ADHD (attention deficit hyperactivity disorder) 05/09/2007   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; multiple punctate foci of restricted diffusion in bilateral frontal lobes and the left parietal lobe   Diverticulitis of colon (without mention of hemorrhage) 09/17/2013   Essential hypertension 10/09/2017   HFrEF (heart failure with reduced ejection fraction) 10/10/2023   History of COVID-19 2021   Hyperlipidemia    Hypothyroidism    Low back pain radiating to right leg 10/09/2017   Mild cognitive impairment of uncertain or unknown etiology 08/04/2022   Mild intermittent asthma with acute exacerbation 05/30/2017   Mild neurocognitive disorder due to multiple etiologies 12/19/2023   NSVT (nonsustained ventricular tachycardia) 10/12/2023   Osteoarthritis    Postmenopausal atrophic vaginitis, severe 08/18/2008   Right knee pain 03/13/2014   S/P arthroscopy of right knee 06/03/2014   Vitamin B12 deficiency     Past Surgical History:  Procedure Laterality Date   ABDOMINAL HYSTERECTOMY     BREAST SURGERY     CARPAL TUNNEL RELEASE Bilateral    CERVICAL DISC ARTHROPLASTY     CESAREAN SECTION     x2   left shoulder surgery     LOOP RECORDER INSERTION N/A 10/12/2023   Procedure: LOOP RECORDER INSERTION;  Surgeon: Cindie Ole DASEN, MD;  Location: [facility];  Service: Cardiovascular;  Laterality: N/A;   MENISCUS REPAIR Right 2017   REDUCTION MAMMAPLASTY Bilateral    right shoulder     TONSILLECTOMY      Family History  Problem Relation Age of Onset  Melanoma Mother    Kidney disease Father    Diabetes Father    Heart disease Father    Breast cancer Paternal  Grandmother    ADD / ADHD Other        multiple family members   Colon cancer Neg Hx    Esophageal cancer Neg Hx    Rectal cancer Neg Hx    Stomach cancer Neg Hx        Current Outpatient Medications:    Acetylcysteine (NAC) 500 MG CAPS, Take 500 mg by mouth daily., Disp: , Rfl:    amphetamine -dextroamphetamine  (ADDERALL) 10 MG tablet, Take 1 tablet (10 mg total) by mouth daily with breakfast. This dose is decreased as before admission., Disp: 30 tablet, Rfl: 0   amphetamine -dextroamphetamine  (ADDERALL) 10 MG tablet, Take 1 tablet (10 mg total) by mouth daily with breakfast. This dose is decreased as before admission., Disp: 30 tablet, Rfl: 0   amphetamine -dextroamphetamine  (ADDERALL) 10 MG tablet, Take 1 tablet (10 mg total) by mouth daily with breakfast. This dose is decreased as before admission., Disp: 30 tablet, Rfl: 0   Ascorbic Acid (VITAMIN C) 1000 MG tablet, Take 1,000 mg by mouth daily., Disp: , Rfl:    aspirin  EC 81 MG tablet, Take 81 mg by mouth daily. Swallow whole., Disp: , Rfl:    atorvastatin  (LIPITOR) 40 MG tablet, Take 0.5 tablets (20 mg total) by mouth daily., Disp: 90 tablet, Rfl: 3   Bioflavonoid Products (QUERCETIN COMPLEX IMMUNE PO), Take 500 mg by mouth daily., Disp: , Rfl:    buPROPion  (WELLBUTRIN  XL) 150 MG 24 hr tablet, Take 1 tablet (150 mg total) by mouth daily., Disp: 30 tablet, Rfl: 0   Cholecalciferol (D3-50 PO), Take 1 capsule by mouth daily., Disp: , Rfl:    Evening Primrose Oil 1000 MG CAPS, Take 1,000 mg by mouth in the morning and at bedtime., Disp: , Rfl:    ezetimibe  (ZETIA ) 10 MG tablet, Take 1 tablet (10 mg total) by mouth at bedtime., Disp: 90 tablet, Rfl: 3   ipratropium (ATROVENT ) 0.06 % nasal spray, Place 2 sprays into both nostrils 2 (two) times daily as needed., Disp: 15 mL, Rfl: 12   metoprolol  succinate (TOPROL  XL) 25 MG 24 hr tablet, Take 0.5 tablets (12.5 mg total) by mouth daily., Disp: 45 tablet, Rfl: 3   SYNTHROID  75 MCG tablet, Take 1  tablet (75 mcg total) by mouth daily., Disp: 90 tablet, Rfl: 3   vitamin B-12 (CYANOCOBALAMIN ) 1000 MCG tablet, Take 1,000 mcg by mouth 2 (two) times a week., Disp: , Rfl:    Zinc 20 MG CAPS, Take 20 mg by mouth daily., Disp: , Rfl:   EXAM:  VITALS per patient if applicable:  GENERAL: alert, oriented, appears well and in no acute distress  HEENT: atraumatic, conjunttiva clear, no obvious abnormalities on inspection of external nose and ears  NECK: normal movements of the head and neck  LUNGS: on inspection no signs of respiratory distress, breathing rate appears normal, no obvious gross SOB, gasping or wheezing  CV: no obvious cyanosis  MS: moves all visible extremities without noticeable abnormality  PSYCH/NEURO: pleasant and cooperative, no obvious depression or anxiety, speech and thought processing grossly intact  ASSESSMENT AND PLAN:  Discussed the following assessment and plan: 1. Attention deficit hyperactivity disorder (ADHD), unspecified ADHD type (Primary) - Try her on Wellbutrin  150 mg extended release daily and have her stop her Adderall.  We can increase her Wellbutrin  to 300 mg in a  couple weeks if we need to.  If she does not get any benefit from this then we will likely need to refer her to [place] attention specialist or psychiatry since they have a better understanding of nonstimulant ADHD medications - buPROPion  (WELLBUTRIN  XL) 150 MG 24 hr tablet; Take 1 tablet (150 mg total) by mouth daily.  Dispense: 30 tablet; Refill: 0    I discussed the assessment and treatment plan with the patient. The patient was provided an opportunity to ask questions and all were answered. The patient agreed with the plan and demonstrated an understanding of the instructions.   The patient was advised to call back or seek an in-person evaluation if the symptoms worsen or if the condition fails to improve as anticipated.   Darleene Shape, NP   Time spent with patient today was 31  minutes which consisted of chart review, discussing  ADHD work up, treatment answering questions and documentation.

## 2024-02-15 ENCOUNTER — Ambulatory Visit: Admitting: Occupational Therapy

## 2024-02-15 DIAGNOSIS — R41840 Attention and concentration deficit: Secondary | ICD-10-CM

## 2024-02-15 DIAGNOSIS — I69318 Other symptoms and signs involving cognitive functions following cerebral infarction: Secondary | ICD-10-CM

## 2024-02-15 DIAGNOSIS — R41841 Cognitive communication deficit: Secondary | ICD-10-CM | POA: Diagnosis not present

## 2024-02-15 NOTE — Therapy (Signed)
 OUTPATIENT OCCUPATIONAL THERAPY NEURO Treatment  Patient Name: PENINA REISNER MRN: [id number] DOB:[birth year], 73 y.o., female Today's Date: 02/15/2024  PCP: Merna Huxley, NP REFERRING PROVIDER: Carilyn Prentice BRAVO, MD  END OF SESSION:  OT End of Session - 02/15/24 1311     Visit Number 12    Number of Visits 16    Date for OT Re-Evaluation 03/04/24    Authorization Type [employer]    OT Start Time 1102    OT Stop Time 1146    OT Time Calculation (min) 44 min    Activity Tolerance Patient tolerated treatment well    Behavior During Therapy WFL for tasks assessed/performed                    Past Medical History:  Diagnosis Date   Acute combined systolic and diastolic heart failure 10/11/2023   ADHD (attention deficit hyperactivity disorder) 05/09/2007   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; multiple punctate foci of restricted diffusion in bilateral frontal lobes and the left parietal lobe   Diverticulitis of colon (without mention of hemorrhage) 09/17/2013   Essential hypertension 10/09/2017   HFrEF (heart failure with reduced ejection fraction) 10/10/2023   History of COVID-19 2021   Hyperlipidemia    Hypothyroidism    Low back pain radiating to right leg 10/09/2017   Mild cognitive impairment of uncertain or unknown etiology 08/04/2022   Mild intermittent asthma with acute exacerbation 05/30/2017   Mild neurocognitive disorder due to multiple etiologies 12/19/2023   NSVT (nonsustained ventricular tachycardia) 10/12/2023   Osteoarthritis    Postmenopausal atrophic vaginitis, severe 08/18/2008   Right knee pain 03/13/2014   S/P arthroscopy of right knee 06/03/2014   Vitamin B12 deficiency    Past Surgical History:  Procedure Laterality Date   ABDOMINAL HYSTERECTOMY     BREAST SURGERY     CARPAL TUNNEL RELEASE Bilateral    CERVICAL DISC ARTHROPLASTY     CESAREAN SECTION     x2   left shoulder surgery     LOOP  RECORDER INSERTION N/A 10/12/2023   Procedure: LOOP RECORDER INSERTION;  Surgeon: Cindie Ole DASEN, MD;  Location: [facility];  Service: Cardiovascular;  Laterality: N/A;   MENISCUS REPAIR Right 2017   REDUCTION MAMMAPLASTY Bilateral    right shoulder     TONSILLECTOMY     Patient Active Problem List   Diagnosis Date Noted   Mild neurocognitive disorder due to multiple etiologies 12/19/2023   NSVT (nonsustained ventricular tachycardia) 10/12/2023   Acute combined systolic and diastolic heart failure 10/11/2023   HFrEF (heart failure with reduced ejection fraction) 10/10/2023   CVA (cerebral vascular accident) 10/08/2023   Mild cognitive impairment of uncertain or unknown etiology 08/04/2022   Vitamin B12 deficiency    Osteoarthritis 08/03/2022   Hyperlipidemia 08/03/2022   Essential hypertension 10/09/2017   Low back pain radiating to right leg 10/09/2017   Mild intermittent asthma with acute exacerbation 05/30/2017   S/P arthroscopy of right knee 06/03/2014   Right knee pain 03/13/2014   Diverticulitis of colon (without mention of hemorrhage) 09/17/2013   Postmenopausal atrophic vaginitis, severe 08/18/2008   Hypothyroidism 05/09/2007   ADHD (attention deficit hyperactivity disorder) 05/09/2007    ONSET DATE: 10/08/23  REFERRING DIAG: I63.9 (ICD-10-CM) - Cerebral infarction, unspecified  THERAPY DIAG:  Attention and concentration deficit  Other symptoms and signs involving cognitive functions following cerebral infarction  Rationale for Evaluation and Treatment: Rehabilitation  SUBJECTIVE:   SUBJECTIVE STATEMENT:  Pt reports that they started her on a new medication to help with her focus.    Pt accompanied by: self and spouse  PERTINENT HISTORY: ADHD, HTN, diverticulitis, HLD, hypothyroidism, low back pain, neck fusion, mild cognitive impairment, OA in R knee, asthma   presented to [facility] ED on 10/08/2023 with sudden onset of left facial droop, left-sided  weakness,and L visual cut. MRI: Multiple punctate acute infarcts bilateral frontal and left parietal lobes. EEG suggestive of moderate diffuse encephalopathy.   PRECAUTIONS: Fall  WEIGHT BEARING RESTRICTIONS: No  PAIN:  Are you having pain? No pain  FALLS: Has patient fallen in last 6 months? No  LIVING ENVIRONMENT: Lives with: lives with their spouse Lives in: House/apartment Stairs: Yes: Internal: bedroom/bathroom on main floor, does have a full flight of steps - spouse's office is upstairs  steps; one rail and External: 2 steps;   Has following equipment at home: shower chair and hand held shower head  PLOF: Independent, Independent with basic ADLs, and Leisure: walking up to 4-6 hours per day  PATIENT GOALS: I really don't know  OBJECTIVE:  Note: Objective measures were completed at Evaluation unless otherwise noted.  HAND DOMINANCE: Right  ADLs: Overall ADLs: Mod I with bathing and dressing, pt's spouse will go in and out of bathroom while pt in shower providing distant supervision to Mod I level Transfers/ambulation related to ADLs: Mod I without AD, getting into high bed without any issue  IADLs: Shopping: pt gets overwhelmed with everything that is going on, easily distracted  Meal Prep: spouse is doing most of the cooking right now as pt forgetful with sequencing and ingredients, and easily distracted Community mobility: not cleared to drive at this time Medication management: spouse is assisting now, he will fill up a daily pill box for pt   MOBILITY STATUS: Mod I without AD  POSTURE COMMENTS:  No Significant postural limitations  ACTIVITY TOLERANCE: Activity tolerance: pt reports worn out after 30 min walk (where as they would walk up to 2 hrs at a time before stroke).  Pt is not sleeping as much during the day time.  FUNCTIONAL OUTCOME MEASURES: 9 hole peg test: Right: 36.54 sec; Left: 34.06 sec Trail Making: 42.47 sec for Trail A   UPPER EXTREMITY ROM:   WFL bilaterally  UPPER EXTREMITY MMT:     MMT Right eval Left eval  Shoulder flexion 4+ 4-  Shoulder abduction    Shoulder adduction    Shoulder extension    Shoulder internal rotation    Shoulder external rotation    Middle trapezius    Lower trapezius    Elbow flexion 4 4  Elbow extension 4- 4-  Wrist flexion    Wrist extension    Wrist ulnar deviation    Wrist radial deviation    Wrist pronation    Wrist supination    (Blank rows = not tested)  COORDINATION: 9 Hole Peg test: Right: 36.54 sec; Left: 34.06 sec Box and Blocks:  Right 43 blocks, Left 36blocks - Pt requiring cues x3 to remember to only pick up one block at a time when completing with Right; requiring cues to go over barrier instead of around (possibly slowing down pace even more).  SENSATION: WFL  COGNITION: Overall cognitive status: Impaired and History of cognitive impairments - at baseline Pt able to recall 3 words: sock blue bed after 5 mins  Pt with difficulty with word finding and memory  VISION: Subjective report: wears glasses, reported f/u with  neurology and ophthalmologist who report to wait for further assessment to allow recovery s/p CVA Baseline vision: Wears glasses all the time  VISION ASSESSMENT: To be further assessed in functional context Eye alignment: WFL Ocular ROM: WFL Gaze preference/alignment: WDL Tracking/Visual pursuits: TBA Visual Fields: TBA in functional context   Bell cancellation test: locating 32/35 in 3:00, pt then able to locate remaining 2/3 in 4:20 before stating I think that is all  PERCEPTION: Impaired  PRAXIS: Impaired: Initiation, Motor planning, and Organization  OBSERVATIONS: Difficult to assess vision, attention, and strength as pt requiring increased cueing and repeat directions.  Unsure full attention and effort during MMT this session.  Pt forgetting instructions during coordination assessment, requiring wait time for recall and/or additional  cues.                                                                                                                             TREATMENT DATE:  02/15/24 Recall and multi-tasking: engaged in Speed card game with focus on pt's recall from previous session.  OT had pt setup cards vs instructing therapist in how to setup to challenge recall.  Pt's spouse had printed setup instructions, but pt not having to refer to them.  Pt with good sequencing initially, however with repetition pt starting to second guess movements and looking to spouse for feedback.  Completed additional round with pt demonstrating improved sequencing and allowing brief pauses to reflect.  Motor/cognitive dual tasking: engaged in ambulating while tossing ball to self and naming food items in alphabetical order.  Pt requiring mod-max question cues for sequencing of toss/catch while naming items and cues for word selection.  Engaged in increased cognitive challenge incorporating recall of previously named items and without category.  OT encouraged use of categories for increased success.    02/12/24 Therapeutic activity: engaged in [place] challenging pt to explain how to play the game.  Pt able to provide instructions for initial portion of game, however having difficulty with recall of 2 steps at midway point of game.  Pt then fixating on previous instructions to attempt to recall next instruction.  OT providing pt with question cues, however pt unable to recall even when given next instruction.   Therapeutic activity: engaged in mobility in clinic with focus on recall of 2-3 step directions.  Pt initially completing first 3 step sequencing task, however selecting incorrect amount of a particular item.  Reviewed instructions with pt unable to recall correct number initially stated.  Pt with good sequencing of items, however with incorrect recall of amounts.  Completed additional 3 steps command with more functional context with pt  demonstrating improved recall of steps and sequence.  OT educating pt on writing things down, especially when needing to remember specific numbers/ amounts.   Recall: OT instructing pt in Speed card game with focus on ability to recall sequencing, instructions, and complete task with alternating attention (as therapist playing simultaneously impacting pt's outcomes).  Pt demonstrating good sequencing during turns, however demonstrating decreased recall of final instructions.  OT encouraged pt to attempt game at home with spouse, and will plan to assess recall at next session.    01/29/24 Trail Making: Trail A: 37.37 sec and Trail B: 3:05.  Pt requiring significantly increased time to alternate attention between numbers and letters.  OT educating on functional carryover of task with encouragement to focus on single tasks at a time and assess ability to add additional task.  Educated on awareness with pt and spouse on when to add and when to remove second task to challenge attention.   Sequencing: pt brought in sequencing homework with good completion.  Pt stating that she did well with all of the steps listed out.  Therefore OT challenging pt to verbally list steps of 2 cooking tasks.  Pt with good sequencing, however not specific in details.   Phone use: pt and spouse still reporting decreased use of personal cell phone, especially when responding to text messages.  OT had pt use phone to call spouse and then to send text message.  Pt demonstrating good use of short cuts with spouse's contact as icon on phone as well as use of predictive text to aid in speed and thinking with texting.  OT encouraged pt to attempt to respond when receiving a text and talk it off if needed prior to responding.  OT encouraged pt to remove extra apps from phone to decrease confusion when navigating to most used apps.   PATIENT EDUCATION: Education details: see today's tx above Person educated: Patient and Spouse Education  method: Explanation and Handouts Education comprehension: verbalized understanding and needs further education  HOME EXERCISE PROGRAM: Provided written suggestions for keeping thinking skills sharp - plan to provide printout at future session 12/13/23 - memory compensation strategies and activities to keep thinking skills sharp (see pt instructions)   GOALS: Goals reviewed with patient? Yes  SHORT TERM GOALS: Target date: 12/15/23  Pt will be independent with coordination HEP with visual handouts for recall. Baseline: new to OPOT Goal status: in progress  2.  Pt will demonstrate understanding of memory compensations and ways to keep thinking skills sharp Baseline: STM deficits and impaired initiation 01/08/24 - pt utilizing writing information down,  WARM strategy, and engaging in logic puzzles with assistance from spouse for attention, sequencing, and deductive reasoning.   Goal status: MET  3.  Pt will demonstrate and/or verbalize understanding of visual attention exercises and compensatory strategies for improved visual scanning and attention. Baseline: left field cut 01/08/24 - pt and spouse report that this does not seem to be an area of concern any longer Goal status: MET, however would benefit from f/u  4.  Pt will demonstrate improved coordination, attention to task, and recall of instructions by completing box and blocks assessment with improved score by 6 blocks bilaterally. Baseline: Right 43 blocks, Left 36blocks - Pt requiring cues x3 to remember to only pick up one block at a time when completing with Right; requiring cues to go over barrier instead of around (possibly slowing down pace even more). 01/08/24: Right 41 blocks, Left 42 blocks - Pt demonstrating improved recall of instructions with only initial instruction Goal status: partially met: not met on RUE, met with LUE    LONG TERM GOALS: Target date: 03/04/24  Pt will verbalize understanding of task modifications,  adaptive strategies, and/or potential AE needs to increase ease, safety, and independence w/ IADLs. Baseline: decreased engagement in  IADLs due to impaired memory and attention Goal status: in progress  2.  Pt will complete simulated medication management activity w/ Supervision by discharge, incorporating compensatory strategies/AE prn Baseline: spouse managing meds at this time, was not prior to CVA Goal status: in progress  3.  Pt will demonstrate ability to sequence simple functional task (simple snack prep, laundry task, etc) at Mod I level with good safety awareness and ability to utilize memory strategies for recall. Baseline:  Goal status: in progress  4.  Pt will navigate a moderately busy environment, completing dual task activity and/or following multi-step commands with 90% accuracy Baseline:  Goal status: in progress  5.  Pt will demonstrate improved initiation and sequencing to be able to navigate use of personal cell phone and send text messages in <15 mins. Baseline: spouse reporting 45 mins to reply to test message Goal status: in progress   ASSESSMENT:  CLINICAL IMPRESSION: Pt continues to demonstrate difficulty with alternating attention and dual tasking with cognitive component.  Pt is an active participant in cognitive retraining activities and sequencing, reporting challenges with naming items - frequently having to stop walking/tossing ball to think of next word.  Pt and spouse receptive to education on continued dual tasking and/or incorporation of cognitive challenges into familiar, routine tasks to continue to focus on cognitive/motor dual tasking  Pt will continue to benefit from additional skilled occupational therapy visits to allow for further focus on sequencing, recall, deductive reasoning with functional tasks to increase participation in IADLs.   PERFORMANCE DEFICITS: in functional skills including ADLs, IADLs, coordination, strength, Fine motor control,  Gross motor control, endurance, decreased knowledge of precautions, vision, and UE functional use, cognitive skills including attention, energy/drive, memory, problem solving, safety awareness, and sequencing, and psychosocial skills including environmental adaptation and routines and behaviors.     PLAN:  OT FREQUENCY: 1-2x/week  OT DURATION: 6 weeks (asking 8 weeks for scheduling)  PLANNED INTERVENTIONS: 02831 OT Re-evaluation, 97535 self care/ADL training, 02889 therapeutic exercise, 97530 therapeutic activity, 97112 neuromuscular re-education, functional mobility training, visual/perceptual remediation/compensation, psychosocial skills training, energy conservation, coping strategies training, patient/family education, and DME and/or AE instructions  RECOMMENDED OTHER SERVICES: SLP  CONSULTED AND AGREED WITH PLAN OF CARE: Patient and family member/caregiver  PLAN FOR NEXT SESSION:   Cognitive/motor dual tasking, sequencing, recall of 2-3 step directions  Phone use  Further assess during functional tasks, review PRN visual compensatory strategies (especially during functional tasks)  Visual scanning tasks  Sequencing tasks    KAYLENE DOMINO, OTR/L 02/15/2024, 1:11 PM  [facility] [address], [address] [place], [place] [address] Phone # [phone] Fax # [phone]

## 2024-02-19 ENCOUNTER — Ambulatory Visit

## 2024-02-19 ENCOUNTER — Ambulatory Visit: Attending: Physical Medicine & Rehabilitation | Admitting: Occupational Therapy

## 2024-02-19 DIAGNOSIS — I69318 Other symptoms and signs involving cognitive functions following cerebral infarction: Secondary | ICD-10-CM | POA: Diagnosis not present

## 2024-02-19 DIAGNOSIS — R4701 Aphasia: Secondary | ICD-10-CM

## 2024-02-19 DIAGNOSIS — R41840 Attention and concentration deficit: Secondary | ICD-10-CM | POA: Diagnosis not present

## 2024-02-19 DIAGNOSIS — R41841 Cognitive communication deficit: Secondary | ICD-10-CM | POA: Diagnosis not present

## 2024-02-19 NOTE — Therapy (Signed)
 OUTPATIENT SPEECH LANGUAGE PATHOLOGY TREATMENT   [name] MRN: [id number] DOB:09-10-1950, 73 y.o., female Today's Date: 02/19/2024  PCP: Merna Huxley, NP REFERRING PROVIDER: Carilyn Prentice BRAVO, MD  END OF SESSION:  End of Session - 02/19/24 1240     Visit Number 12    Number of Visits 17    Date for SLP Re-Evaluation 03/20/24    SLP Start Time 1235    SLP Stop Time  1315    SLP Time Calculation (min) 40 min    Activity Tolerance Patient tolerated treatment well                Past Medical History:  Diagnosis Date   Acute combined systolic and diastolic heart failure 10/11/2023   ADHD (attention deficit hyperactivity disorder) 05/09/2007   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; multiple punctate foci of restricted diffusion in bilateral frontal lobes and the left parietal lobe   Diverticulitis of colon (without mention of hemorrhage) 09/17/2013   Essential hypertension 10/09/2017   HFrEF (heart failure with reduced ejection fraction) 10/10/2023   History of COVID-19 2021   Hyperlipidemia    Hypothyroidism    Low back pain radiating to right leg 10/09/2017   Mild cognitive impairment of uncertain or unknown etiology 08/04/2022   Mild intermittent asthma with acute exacerbation 05/30/2017   Mild neurocognitive disorder due to multiple etiologies 12/19/2023   NSVT (nonsustained ventricular tachycardia) 10/12/2023   Osteoarthritis    Postmenopausal atrophic vaginitis, severe 08/18/2008   Right knee pain 03/13/2014   S/P arthroscopy of right knee 06/03/2014   Vitamin B12 deficiency    Past Surgical History:  Procedure Laterality Date   ABDOMINAL HYSTERECTOMY     BREAST SURGERY     CARPAL TUNNEL RELEASE Bilateral    CERVICAL DISC ARTHROPLASTY     CESAREAN SECTION     x2   left shoulder surgery     LOOP RECORDER INSERTION N/A 10/12/2023   Procedure: LOOP RECORDER INSERTION;  Surgeon: Cindie Ole DASEN, MD;   Location: [facility];  Service: Cardiovascular;  Laterality: N/A;   MENISCUS REPAIR Right 2017   REDUCTION MAMMAPLASTY Bilateral    right shoulder     TONSILLECTOMY     Patient Active Problem List   Diagnosis Date Noted   Mild neurocognitive disorder due to multiple etiologies 12/19/2023   NSVT (nonsustained ventricular tachycardia) 10/12/2023   Acute combined systolic and diastolic heart failure 10/11/2023   HFrEF (heart failure with reduced ejection fraction) 10/10/2023   CVA (cerebral vascular accident) 10/08/2023   Mild cognitive impairment of uncertain or unknown etiology 08/04/2022   Vitamin B12 deficiency    Osteoarthritis 08/03/2022   Hyperlipidemia 08/03/2022   Essential hypertension 10/09/2017   Low back pain radiating to right leg 10/09/2017   Mild intermittent asthma with acute exacerbation 05/30/2017   S/P arthroscopy of right knee 06/03/2014   Right knee pain 03/13/2014   Diverticulitis of colon (without mention of hemorrhage) 09/17/2013   Postmenopausal atrophic vaginitis, severe 08/18/2008   Hypothyroidism 05/09/2007   ADHD (attention deficit hyperactivity disorder) 05/09/2007     ONSET DATE:  10/08/23  REFERRING DIAG: P30.679 (ICD-10-CM) - Aphasia as late effect of stroke  THERAPY DIAG:  Cognitive communication deficit  Aphasia  Rationale for Evaluation and Treatment: Rehabilitation  SUBJECTIVE:   SUBJECTIVE STATEMENT: Pt recalled that SLP asked her to find out what they are studying in [employer] this year so she could make decision on  whether she will participate.     Pt accompanied by: self   PERTINENT HISTORY: HPI: Pt presented to [facility] ED on 10/08/2023 with sudden onset of left facial droop and left-sided weakness. ADHD, HTN, diverticulitis, HLD, hypothyroidism, low back pain, neck fusion, mild cognitive impairment (dx Jan 2024), cervical disc arthroplasty, OA in R knee, asthma   PAIN:  Are you having pain? No  FALLS: Has  patient fallen in last 6 months?  No  PATIENT GOALS: Improve language function, memory  OBJECTIVE:  Note: Objective measures were completed at Evaluation unless otherwise noted.  DIAGNOSTIC FINDINGS:  MRI 10/09/23 IMPRESSION: 1. Findings compatible with multiple punctate acute infarcts in bilateral frontal and left parietal lobes. Given involvement of multiple vascular territories, consider an embolic etiology. 2. Significantly motion limited study.   Date of Discharge from SLP service:October 19, 2023 Clinical Impression/Discharge Summary: Pt has made great gains and has met 4 of 6 LTG's this admission due to improved cognition and language. Pt is currently an overall mod I A for orientation tasks, though requires up to [facility] for attention. Patient requires mod I A for expressive language, though continues to require occasional min-modA for complex receptive language tasks.  Pt/family education complete and pt will discharge home with 24 hour supervision from friends/family/etc. Pt would benefit from OP f/u ST services to maximize cognition and communication in order to maximize functional independence.     STANDARDIZED ASSESSMENTS:   Cognitive Linguistic Quick Test  AGE - 70-89  The Cognitive Linguistic Quick Test (CLQT) was administered to assess the relative status of five cognitive domains: attention, memory, language, executive functioning, and visuospatial skills. Scores from 10 tasks were used to estimate severity ratings (standardized for age groups 18-69 years and 70-89 years) for each domain, a clock drawing task, as well as an overall composite severity rating of cognition.     Task Score Criterion Cut Scores  Personal Facts 8/8 8  Symbol Cancellation 12/12 10  Confrontation Naming 10/10 10  Clock Drawing  12/13 11  Story Retelling 8/10 5  Symbol Trails 10/10 6  Generative Naming 3/9 4  Design Memory 4/6 4  Mazes  4/8 4  Design Generation 3/13 5   Cognitive Domain Composite  Score Severity Rating  Attention 181/215 WNL  Memory 147/185 WNL  Executive Function 20/40 Low-WNL  Language 29/37 Low-WNL  Visuospatial Skills 75/105 WNL  Clock Drawing  12/13 WNL  Composite Severity Rating  WNL     PATIENT REPORTED OUTCOME MEASURES (PROM): Communication Effectiveness Survey: 12/20/23 - pt scored herself 18/32, with higher scores indicating less of a negative effect of pt's deficits on daily life and QOL.                                                                                                                              TREATMENT DATE:  Semantic Feature Analysis (SFA)  02/19/24: SLP assisted pt in problem solving with [employer]  given the information she found. Initially she felt like she could keep up but SLP talked further with pt about prophecy books and she decided this was not best for her to participate. Pt had difficulty arriving at a way she could participate but not necessarily do al lthe work necessary and SLP helped pt realize other options (problem solve). Pt is  now on Wellbutrin , which she and [name] will assess if attention is improved or same with this med compared to Adderall.   02/15/24: Pt with discussion with PCP this afternoon about meds, Adderall is not recommended for her due to cardiac health. Pt's attention has decr'd since CVA but pt's attention med was reduced to half dose than prior to CVA. SLP took time to explain/educate pt and husband why an attention med would cont to be helpful for pt from a therapeutic perspective. SLP ascertained that pt's anomia has improved but not dramatically since her hospital d/c for CVA. SLP to cont with pt for anomia/verbal expression and for problem solving in a functional context. Pt and husband agree with this plan.   02/02/24: SLP worked with pt today with word finding using divergent naming tasks with salient categories for pt. SLP req'd to provide mod cues occasionally for pt's success. SLP  wonders if pt's premorbid memory deficit/MCI is impeding pt progress with anomia. SLP instructed pt to cont SFA and naming tasks at home. Pt completing word/language tasks with aphasia book at home, regularly.  01/23/24: Bunnie required incr'd cue level from [place] as steps increased for her homework. Today SLP with long discussion what pt could complete with husband at home to work with china attention and organization. Suggested cooking, baking, or organizing pantry or junk drawer in the kitchen, with steps written out in an organizational manner prior to beginning her work.  SLP also discussed with pt and [name] about pt keeping her own calendar at this time, with [name] assisting PRN. SLP expained that over time pt would do this independently but at this time she will need George's assistance and that this was a stepping stone to her eventually keeping her own calendar independently like she did prior to CVA.   01/08/24: Pt with 3 hour visit this morning from sister, and brother (from [facility]). Today SLP helped pt with occasional mod A with problem solving/sequencing task. She req'd extra time and redirection at times with 13-step sequencing task. Took pt 17 minutes for this task. Level of fatigue at which pt entered may have played a role.  [name] had question about 6-step sequencing tasks- he could not find any online. SLP suggested he and pt think through some everyday tasks and write down 6 steps in them and SLP provided 7-8 examples.  01/03/24: Pt brought homework in completed. She enjoyed sequencing task. With simple problem solving tasks pt req'd rare min A, but performance was functional.  Her speech today was WNL rate, without anomia. When she has anomia, she uses synonym or description strategy. SLP observed pt to use description strategy in previous sessions. Homework provided.  01/01/24: Pt did not complete any SFA since last session. SLP told pt to complete when she has anomia with a noun. Pt  brought homework with her completed. She finished 40 wrtiten definitions in approx 90 minutes. Today SLP targeted simple problem solving with 5-step sequencing task. Pt took almost 10 minutes to complete 4 sequences. She was provided with homework until next session.  12/28/23: Pt brought in books that daughter bought her.  SLP to look at these and star pages SLP thinks would be helpful for pt. SLP worked with pt today with SFA - grill. She req'd mod A occasionally for procedure and for more accurate wording, especially description. SLP provided homework for description.  12/25/23:  Minimal difficulty with letter fill in homework. Focusing on problem solving in  cognitive linguistics today, SLP assisted pt with scrambled sentences.Four to 5-word sentences were completed with 4/4 success. When pt worked on 7-9 word sentences with SLP she had intermittent difficulty with mental flexibility with word choice. SLP provided mod cues faded to min nonverbal cues. Pt's attention/memory deficit made alternating attention difficult at times so SLP suggested pt write the word order above the words and this helped pt compensate for decr'd alternating attention.  SLP provided compensations for anomia for pt and educated her and [name] about these.   12/20/23: PROM provided today, with score above. [name] cueing pt about the accuracy of her answers. Needs anomia compensations next session. SLP asked [name] to fill out a CES as well for SLP to compare. Pt completed 3 clues task with 90% success, however only named average 2 items in a common category for divergent naming prior to requiring consistent mod-max  A. SLP provided letter fill ins for homework, and divergent naming for flowers in her yard, and for sports.  12/14/23: PT NEEDS PROM NEXT SESSION. SLP completed CLQT today. From pt's CLQT score it would indicate that although pt's Composite Score is WNL she had difficulty with processing speed (mazes, design generation),  problem solving Forensic psychologist), memory Training and development officer memory), and language (generative naming). Goals added.   12/07/23: SLP educated pt and husband [name]) with describing phonemic cues, as this appeared to help pt during BNT. SLP provided examples during BNT and told [name] to assist pt in this way if he is sure of the word pt means to say. Secondly, SLP educated pt and husband on how to complete semantic feature analysis (SFA) at home with words/nouns pt experiences anomia with. SLP explained benefits of SFA to pt and husband. [name] took notes during session. SLP provided handouts for this framework.  PATIENT EDUCATION: Education details: see treatment date Person educated: Patient and Spouse Education method: Medical illustrator Education comprehension: verbalized understanding and needs further education   GOALS: Goals reviewed with patient? Yes in general  SHORT TERM GOALS: Target date: 01/05/24  Pt will complete cognitive linguistic assessment in first 2 sessions  Baseline: Goal status: met  2.  Pt will successfully use anomia compensations with min A to do so, for 10 minutes functional simple conversation in 2 sessions Baseline:  Goal status: partially met  3.  Pt will demo how to use SFA with rare min A in 3 sessions Baseline: 01/03/24 Goal status: partially met  4.  Pt will demo [facility] problem solving skills in simple cognitive linguistic tasks in 2 sessions Baseline: 01/03/24 Goal status: partially met   LONG TERM GOALS: Target date:  03/20/24  Pt will improve PROM compared to initial administration Baseline:  Goal status: INITIAL  2.  Pt will participate in 10 minutes mod complex conversation with functional expressive language (using anomia compensations) in 3 sessions Baseline:  Goal status: INITIAL  3.  Pt will demo functional problem solving skills in practical cognitive linguistic tasks in 3 sessions Baseline:  Goal status:  INITIAL   ASSESSMENT:  CLINICAL IMPRESSION: Patient is a 73 y.o. F who was seen today for treatment of aphasia and problem solving  in light of bilateral frontal CVA, and parietal CVA. See treatment date above for today's date for further details on today's session. Given her premorbid dx of ADHD and MCI and OT focus on other areas identified on CLQT, SLP will focus on problem solving in functional cognitive linguistic tasks. She also demonstrated s/sx of cognitive linguistic impairment requiring a full evaluation in the first 2 sessions. Pt with premorbid dx of ADHD and MCI.   OBJECTIVE IMPAIRMENTS: include attention, memory, awareness, and aphasia. These impairments are limiting patient from managing medications, managing appointments, managing finances, household responsibilities, ADLs/IADLs, and effectively communicating at home and in community. Factors affecting potential to achieve goals and functional outcome are ability to learn/carryover information and previous level of function. Patient will benefit from skilled SLP services to address above impairments and improve overall function.  REHAB POTENTIAL: Good  PLAN:  SLP FREQUENCY: 2x/week  SLP DURATION: 17 sessions total  PLANNED INTERVENTIONS: Language facilitation, Environmental controls, Cueing hierachy, Cognitive reorganization, Internal/external aids, Functional tasks, Multimodal communication approach, SLP instruction and feedback, Compensatory strategies, Patient/family education, [phone] Treatment of speech (30 or 45 min) , and [id number] (Standardized cognitive assessment)    Jenniferann Stuckert, CCC-SLP 02/19/2024, 12:50 PM

## 2024-02-19 NOTE — Therapy (Signed)
 OUTPATIENT OCCUPATIONAL THERAPY NEURO Treatment  [name] MRN: [id number] DOB:[birth year], 73 y.o., female Today's Date: 02/19/2024  PCP: Merna Huxley, NP REFERRING PROVIDER: Carilyn Prentice BRAVO, MD  END OF SESSION:  OT End of Session - 02/19/24 1502     Visit Number 13    Number of Visits 16    Date for OT Re-Evaluation 03/04/24    Authorization Type [employer]    OT Start Time 1320    OT Stop Time 1405    OT Time Calculation (min) 45 min    Activity Tolerance Patient tolerated treatment well    Behavior During Therapy WFL for tasks assessed/performed                     Past Medical History:  Diagnosis Date   Acute combined systolic and diastolic heart failure 10/11/2023   ADHD (attention deficit hyperactivity disorder) 05/09/2007   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; multiple punctate foci of restricted diffusion in bilateral frontal lobes and the left parietal lobe   Diverticulitis of colon (without mention of hemorrhage) 09/17/2013   Essential hypertension 10/09/2017   HFrEF (heart failure with reduced ejection fraction) 10/10/2023   History of COVID-19 2021   Hyperlipidemia    Hypothyroidism    Low back pain radiating to right leg 10/09/2017   Mild cognitive impairment of uncertain or unknown etiology 08/04/2022   Mild intermittent asthma with acute exacerbation 05/30/2017   Mild neurocognitive disorder due to multiple etiologies 12/19/2023   NSVT (nonsustained ventricular tachycardia) 10/12/2023   Osteoarthritis    Postmenopausal atrophic vaginitis, severe 08/18/2008   Right knee pain 03/13/2014   S/P arthroscopy of right knee 06/03/2014   Vitamin B12 deficiency    Past Surgical History:  Procedure Laterality Date   ABDOMINAL HYSTERECTOMY     BREAST SURGERY     CARPAL TUNNEL RELEASE Bilateral    CERVICAL DISC ARTHROPLASTY     CESAREAN SECTION     x2   left shoulder surgery      LOOP RECORDER INSERTION N/A 10/12/2023   Procedure: LOOP RECORDER INSERTION;  Surgeon: Cindie Ole DASEN, MD;  Location: [facility];  Service: Cardiovascular;  Laterality: N/A;   MENISCUS REPAIR Right 2017   REDUCTION MAMMAPLASTY Bilateral    right shoulder     TONSILLECTOMY     Patient Active Problem List   Diagnosis Date Noted   Mild neurocognitive disorder due to multiple etiologies 12/19/2023   NSVT (nonsustained ventricular tachycardia) 10/12/2023   Acute combined systolic and diastolic heart failure 10/11/2023   HFrEF (heart failure with reduced ejection fraction) 10/10/2023   CVA (cerebral vascular accident) 10/08/2023   Mild cognitive impairment of uncertain or unknown etiology 08/04/2022   Vitamin B12 deficiency    Osteoarthritis 08/03/2022   Hyperlipidemia 08/03/2022   Essential hypertension 10/09/2017   Low back pain radiating to right leg 10/09/2017   Mild intermittent asthma with acute exacerbation 05/30/2017   S/P arthroscopy of right knee 06/03/2014   Right knee pain 03/13/2014   Diverticulitis of colon (without mention of hemorrhage) 09/17/2013   Postmenopausal atrophic vaginitis, severe 08/18/2008   Hypothyroidism 05/09/2007   ADHD (attention deficit hyperactivity disorder) 05/09/2007    ONSET DATE: 10/08/23  REFERRING DIAG: I63.9 (ICD-10-CM) - Cerebral infarction, unspecified  THERAPY DIAG:  Attention and concentration deficit  Other symptoms and signs involving cognitive functions following cerebral infarction  Rationale for Evaluation and Treatment: Rehabilitation  SUBJECTIVE:   SUBJECTIVE  STATEMENT: Pt reports that they started her on a new medication to help with her focus.    Pt accompanied by: self and spouse  PERTINENT HISTORY: ADHD, HTN, diverticulitis, HLD, hypothyroidism, low back pain, neck fusion, mild cognitive impairment, OA in R knee, asthma   presented to [facility] ED on 10/08/2023 with sudden onset of left facial droop, left-sided  weakness,and L visual cut. MRI: Multiple punctate acute infarcts bilateral frontal and left parietal lobes. EEG suggestive of moderate diffuse encephalopathy.   PRECAUTIONS: Fall  WEIGHT BEARING RESTRICTIONS: No  PAIN:  Are you having pain? No pain  FALLS: Has patient fallen in last 6 months? No  LIVING ENVIRONMENT: Lives with: lives with their spouse Lives in: House/apartment Stairs: Yes: Internal: bedroom/bathroom on main floor, does have a full flight of steps - spouse's office is upstairs  steps; one rail and External: 2 steps;   Has following equipment at home: shower chair and hand held shower head  PLOF: Independent, Independent with basic ADLs, and Leisure: walking up to 4-6 hours per day  PATIENT GOALS: I really don't know  OBJECTIVE:  Note: Objective measures were completed at Evaluation unless otherwise noted.  HAND DOMINANCE: Right  ADLs: Overall ADLs: Mod I with bathing and dressing, pt's spouse will go in and out of bathroom while pt in shower providing distant supervision to Mod I level Transfers/ambulation related to ADLs: Mod I without AD, getting into high bed without any issue  IADLs: Shopping: pt gets overwhelmed with everything that is going on, easily distracted  Meal Prep: spouse is doing most of the cooking right now as pt forgetful with sequencing and ingredients, and easily distracted Community mobility: not cleared to drive at this time Medication management: spouse is assisting now, he will fill up a daily pill box for pt   MOBILITY STATUS: Mod I without AD  POSTURE COMMENTS:  No Significant postural limitations  ACTIVITY TOLERANCE: Activity tolerance: pt reports worn out after 30 min walk (where as they would walk up to 2 hrs at a time before stroke).  Pt is not sleeping as much during the day time.  FUNCTIONAL OUTCOME MEASURES: 9 hole peg test: Right: 36.54 sec; Left: 34.06 sec Trail Making: 42.47 sec for Trail A   UPPER EXTREMITY ROM:   WFL bilaterally  UPPER EXTREMITY MMT:     MMT Right eval Left eval  Shoulder flexion 4+ 4-  Shoulder abduction    Shoulder adduction    Shoulder extension    Shoulder internal rotation    Shoulder external rotation    Middle trapezius    Lower trapezius    Elbow flexion 4 4  Elbow extension 4- 4-  Wrist flexion    Wrist extension    Wrist ulnar deviation    Wrist radial deviation    Wrist pronation    Wrist supination    (Blank rows = not tested)  COORDINATION: 9 Hole Peg test: Right: 36.54 sec; Left: 34.06 sec Box and Blocks:  Right 43 blocks, Left 36blocks - Pt requiring cues x3 to remember to only pick up one block at a time when completing with Right; requiring cues to go over barrier instead of around (possibly slowing down pace even more).  SENSATION: WFL  COGNITION: Overall cognitive status: Impaired and History of cognitive impairments - at baseline Pt able to recall 3 words: sock blue bed after 5 mins  Pt with difficulty with word finding and memory  VISION: Subjective report: wears glasses, reported f/u  with neurology and ophthalmologist who report to wait for further assessment to allow recovery s/p CVA Baseline vision: Wears glasses all the time  VISION ASSESSMENT: To be further assessed in functional context Eye alignment: WFL Ocular ROM: WFL Gaze preference/alignment: WDL Tracking/Visual pursuits: TBA Visual Fields: TBA in functional context   Bell cancellation test: locating 32/35 in 3:00, pt then able to locate remaining 2/3 in 4:20 before stating I think that is all  PERCEPTION: Impaired  PRAXIS: Impaired: Initiation, Motor planning, and Organization  OBSERVATIONS: Difficult to assess vision, attention, and strength as pt requiring increased cueing and repeat directions.  Unsure full attention and effort during MMT this session.  Pt forgetting instructions during coordination assessment, requiring wait time for recall and/or additional  cues.                                                                                                                             TREATMENT DATE:  02/19/24 Sequencing: Pt able to demonstrate appropriate sequencing for texting and making a phone call.  Pt still with anomia impacting ability to verbalize sequencing but able to demonstrate. Phone use: engaged in discussion about removing excess apps (to a different page vs deleting) and removing alerts for social media to allow for increased ability to manage most used functions on phone.  Pt's spouse rearranging apps during session with plan to assess if improved ease and attention to task with decreased visual distractions. Motor/cognitive dual tasking: engaged in use of sequencing cards with pt completing 3 step motor commands with good sequencing and recall.  OT then providing pt with additional 3 step sequencing task to challenge recall with pt requiring increased time but able to recall all 3 tasks correctly.     02/15/24 Recall and multi-tasking: engaged in Speed card game with focus on pt's recall from previous session.  OT had pt setup cards vs instructing therapist in how to setup to challenge recall.  Pt's spouse had printed setup instructions, but pt not having to refer to them.  Pt with good sequencing initially, however with repetition pt starting to second guess movements and looking to spouse for feedback.  Completed additional round with pt demonstrating improved sequencing and allowing brief pauses to reflect.  Motor/cognitive dual tasking: engaged in ambulating while tossing ball to self and naming food items in alphabetical order.  Pt requiring mod-max question cues for sequencing of toss/catch while naming items and cues for word selection.  Engaged in increased cognitive challenge incorporating recall of previously named items and without category.  OT encouraged use of categories for increased success.    02/12/24 Therapeutic  activity: engaged in [place] challenging pt to explain how to play the game.  Pt able to provide instructions for initial portion of game, however having difficulty with recall of 2 steps at midway point of game.  Pt then fixating on previous instructions to attempt to recall next instruction.  OT providing pt with question  cues, however pt unable to recall even when given next instruction.   Therapeutic activity: engaged in mobility in clinic with focus on recall of 2-3 step directions.  Pt initially completing first 3 step sequencing task, however selecting incorrect amount of a particular item.  Reviewed instructions with pt unable to recall correct number initially stated.  Pt with good sequencing of items, however with incorrect recall of amounts.  Completed additional 3 steps command with more functional context with pt demonstrating improved recall of steps and sequence.  OT educating pt on writing things down, especially when needing to remember specific numbers/ amounts.   Recall: OT instructing pt in Speed card game with focus on ability to recall sequencing, instructions, and complete task with alternating attention (as therapist playing simultaneously impacting pt's outcomes).  Pt demonstrating good sequencing during turns, however demonstrating decreased recall of final instructions.  OT encouraged pt to attempt game at home with spouse, and will plan to assess recall at next session.   PATIENT EDUCATION: Education details: see today's tx above Person educated: Patient and Spouse Education method: Explanation and Handouts Education comprehension: verbalized understanding and needs further education  HOME EXERCISE PROGRAM: Provided written suggestions for keeping thinking skills sharp - plan to provide printout at future session 12/13/23 - memory compensation strategies and activities to keep thinking skills sharp (see pt instructions)   GOALS: Goals reviewed with patient? Yes  SHORT  TERM GOALS: Target date: 12/15/23  Pt will be independent with coordination HEP with visual handouts for recall. Baseline: new to OPOT Goal status: in progress  2.  Pt will demonstrate understanding of memory compensations and ways to keep thinking skills sharp Baseline: STM deficits and impaired initiation 01/08/24 - pt utilizing writing information down,  WARM strategy, and engaging in logic puzzles with assistance from spouse for attention, sequencing, and deductive reasoning.   Goal status: MET  3.  Pt will demonstrate and/or verbalize understanding of visual attention exercises and compensatory strategies for improved visual scanning and attention. Baseline: left field cut 01/08/24 - pt and spouse report that this does not seem to be an area of concern any longer Goal status: MET, however would benefit from f/u  4.  Pt will demonstrate improved coordination, attention to task, and recall of instructions by completing box and blocks assessment with improved score by 6 blocks bilaterally. Baseline: Right 43 blocks, Left 36blocks - Pt requiring cues x3 to remember to only pick up one block at a time when completing with Right; requiring cues to go over barrier instead of around (possibly slowing down pace even more). 01/08/24: Right 41 blocks, Left 42 blocks - Pt demonstrating improved recall of instructions with only initial instruction Goal status: partially met: not met on RUE, met with LUE    LONG TERM GOALS: Target date: 03/04/24  Pt will verbalize understanding of task modifications, adaptive strategies, and/or potential AE needs to increase ease, safety, and independence w/ IADLs. Baseline: decreased engagement in IADLs due to impaired memory and attention Goal status: in progress  2.  Pt will complete simulated medication management activity w/ Supervision by discharge, incorporating compensatory strategies/AE prn Baseline: spouse managing meds at this time, was not prior to  CVA Goal status: in progress  3.  Pt will demonstrate ability to sequence simple functional task (simple snack prep, laundry task, etc) at Mod I level with good safety awareness and ability to utilize memory strategies for recall. Baseline:  Goal status: in progress  4.  Pt will  navigate a moderately busy environment, completing dual task activity and/or following multi-step commands with 90% accuracy Baseline:  Goal status: in progress  5.  Pt will demonstrate improved initiation and sequencing to be able to navigate use of personal cell phone and send text messages in <15 mins. Baseline: spouse reporting 45 mins to reply to test message Goal status: in progress   ASSESSMENT:  CLINICAL IMPRESSION: Pt continues to demonstrate difficulty with sequencing of tasks, however question if it is more anomia impacting with pt unsure how to verbalize sequences > ability to sequence tasks.  Pt continues to be an active participant in cognitive retraining activities and sequencing, demonstrating improved timing/pacing with dual tasks this session. Pt and spouse receptive to education on continued dual tasking and/or incorporation of cognitive challenges into familiar, routine tasks to continue to focus on cognitive/motor dual tasking  Pt will continue to benefit from additional skilled occupational therapy visits to allow for further focus on sequencing, recall, deductive reasoning with functional tasks to increase participation in IADLs.   PERFORMANCE DEFICITS: in functional skills including ADLs, IADLs, coordination, strength, Fine motor control, Gross motor control, endurance, decreased knowledge of precautions, vision, and UE functional use, cognitive skills including attention, energy/drive, memory, problem solving, safety awareness, and sequencing, and psychosocial skills including environmental adaptation and routines and behaviors.     PLAN:  OT FREQUENCY: 1-2x/week  OT DURATION: 6 weeks  (asking 8 weeks for scheduling)  PLANNED INTERVENTIONS: 02831 OT Re-evaluation, 97535 self care/ADL training, 02889 therapeutic exercise, 97530 therapeutic activity, 97112 neuromuscular re-education, functional mobility training, visual/perceptual remediation/compensation, psychosocial skills training, energy conservation, coping strategies training, patient/family education, and DME and/or AE instructions  RECOMMENDED OTHER SERVICES: SLP  CONSULTED AND AGREED WITH PLAN OF CARE: Patient and family member/caregiver  PLAN FOR NEXT SESSION:   Cognitive/motor dual tasking, sequencing, recall of 2-3 step directions  Phone use  Further assess during functional tasks, review PRN visual compensatory strategies (especially during functional tasks)  Visual scanning tasks  Sequencing tasks    KAYLENE DOMINO, OTR/L 02/19/2024, 3:03 PM  [facility] [address] [place], [place] [address] Phone # [phone] Fax # [phone]

## 2024-02-21 ENCOUNTER — Ambulatory Visit: Admitting: Occupational Therapy

## 2024-02-21 DIAGNOSIS — I69318 Other symptoms and signs involving cognitive functions following cerebral infarction: Secondary | ICD-10-CM

## 2024-02-21 DIAGNOSIS — R41840 Attention and concentration deficit: Secondary | ICD-10-CM | POA: Diagnosis not present

## 2024-02-21 NOTE — Therapy (Signed)
 OUTPATIENT OCCUPATIONAL THERAPY NEURO Treatment  [name] MRN: [id number] DOB:October 09, 1950, 73 y.o., female Today's Date: 02/21/2024  PCP: Merna Huxley, NP REFERRING PROVIDER: Carilyn Prentice BRAVO, MD  END OF SESSION:  OT End of Session - 02/21/24 1523     Visit Number 14    Number of Visits 16    Date for OT Re-Evaluation 03/04/24    Authorization Type [employer]    OT Start Time 1318    OT Stop Time 1402    OT Time Calculation (min) 44 min    Activity Tolerance Patient tolerated treatment well    Behavior During Therapy WFL for tasks assessed/performed                      Past Medical History:  Diagnosis Date   Acute combined systolic and diastolic heart failure 10/11/2023   ADHD (attention deficit hyperactivity disorder) 05/09/2007   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; multiple punctate foci of restricted diffusion in bilateral frontal lobes and the left parietal lobe   Diverticulitis of colon (without mention of hemorrhage) 09/17/2013   Essential hypertension 10/09/2017   HFrEF (heart failure with reduced ejection fraction) 10/10/2023   History of COVID-19 2021   Hyperlipidemia    Hypothyroidism    Low back pain radiating to right leg 10/09/2017   Mild cognitive impairment of uncertain or unknown etiology 08/04/2022   Mild intermittent asthma with acute exacerbation 05/30/2017   Mild neurocognitive disorder due to multiple etiologies 12/19/2023   NSVT (nonsustained ventricular tachycardia) 10/12/2023   Osteoarthritis    Postmenopausal atrophic vaginitis, severe 08/18/2008   Right knee pain 03/13/2014   S/P arthroscopy of right knee 06/03/2014   Vitamin B12 deficiency    Past Surgical History:  Procedure Laterality Date   ABDOMINAL HYSTERECTOMY     BREAST SURGERY     CARPAL TUNNEL RELEASE Bilateral    CERVICAL DISC ARTHROPLASTY     CESAREAN SECTION     x2   left shoulder surgery      LOOP RECORDER INSERTION N/A 10/12/2023   Procedure: LOOP RECORDER INSERTION;  Surgeon: Cindie Ole DASEN, MD;  Location: [facility];  Service: Cardiovascular;  Laterality: N/A;   MENISCUS REPAIR Right 2017   REDUCTION MAMMAPLASTY Bilateral    right shoulder     TONSILLECTOMY     Patient Active Problem List   Diagnosis Date Noted   Mild neurocognitive disorder due to multiple etiologies 12/19/2023   NSVT (nonsustained ventricular tachycardia) 10/12/2023   Acute combined systolic and diastolic heart failure 10/11/2023   HFrEF (heart failure with reduced ejection fraction) 10/10/2023   CVA (cerebral vascular accident) 10/08/2023   Mild cognitive impairment of uncertain or unknown etiology 08/04/2022   Vitamin B12 deficiency    Osteoarthritis 08/03/2022   Hyperlipidemia 08/03/2022   Essential hypertension 10/09/2017   Low back pain radiating to right leg 10/09/2017   Mild intermittent asthma with acute exacerbation 05/30/2017   S/P arthroscopy of right knee 06/03/2014   Right knee pain 03/13/2014   Diverticulitis of colon (without mention of hemorrhage) 09/17/2013   Postmenopausal atrophic vaginitis, severe 08/18/2008   Hypothyroidism 05/09/2007   ADHD (attention deficit hyperactivity disorder) 05/09/2007    ONSET DATE: 10/08/23  REFERRING DIAG: I63.9 (ICD-10-CM) - Cerebral infarction, unspecified  THERAPY DIAG:  Attention and concentration deficit  Other symptoms and signs involving cognitive functions following cerebral infarction  Rationale for Evaluation and Treatment: Rehabilitation  SUBJECTIVE:  SUBJECTIVE STATEMENT: Pt reports changes to phone setup are working out fine.   Pt accompanied by: self and spouse  PERTINENT HISTORY: ADHD, HTN, diverticulitis, HLD, hypothyroidism, low back pain, neck fusion, mild cognitive impairment, OA in R knee, asthma   presented to [facility] ED on 10/08/2023 with sudden onset of left facial droop, left-sided weakness,and L visual  cut. MRI: Multiple punctate acute infarcts bilateral frontal and left parietal lobes. EEG suggestive of moderate diffuse encephalopathy.   PRECAUTIONS: Fall  WEIGHT BEARING RESTRICTIONS: No  PAIN:  Are you having pain? No pain  FALLS: Has patient fallen in last 6 months? No  LIVING ENVIRONMENT: Lives with: lives with their spouse Lives in: House/apartment Stairs: Yes: Internal: bedroom/bathroom on main floor, does have a full flight of steps - spouse's office is upstairs  steps; one rail and External: 2 steps;   Has following equipment at home: shower chair and hand held shower head  PLOF: Independent, Independent with basic ADLs, and Leisure: walking up to 4-6 hours per day  PATIENT GOALS: I really don't know  OBJECTIVE:  Note: Objective measures were completed at Evaluation unless otherwise noted.  HAND DOMINANCE: Right  ADLs: Overall ADLs: Mod I with bathing and dressing, pt's spouse will go in and out of bathroom while pt in shower providing distant supervision to Mod I level Transfers/ambulation related to ADLs: Mod I without AD, getting into high bed without any issue  IADLs: Shopping: pt gets overwhelmed with everything that is going on, easily distracted  Meal Prep: spouse is doing most of the cooking right now as pt forgetful with sequencing and ingredients, and easily distracted Community mobility: not cleared to drive at this time Medication management: spouse is assisting now, he will fill up a daily pill box for pt   MOBILITY STATUS: Mod I without AD  POSTURE COMMENTS:  No Significant postural limitations  ACTIVITY TOLERANCE: Activity tolerance: pt reports worn out after 30 min walk (where as they would walk up to 2 hrs at a time before stroke).  Pt is not sleeping as much during the day time.  FUNCTIONAL OUTCOME MEASURES: 9 hole peg test: Right: 36.54 sec; Left: 34.06 sec Trail Making: 42.47 sec for Trail A   UPPER EXTREMITY ROM:  WFL  bilaterally  UPPER EXTREMITY MMT:     MMT Right eval Left eval  Shoulder flexion 4+ 4-  Shoulder abduction    Shoulder adduction    Shoulder extension    Shoulder internal rotation    Shoulder external rotation    Middle trapezius    Lower trapezius    Elbow flexion 4 4  Elbow extension 4- 4-  Wrist flexion    Wrist extension    Wrist ulnar deviation    Wrist radial deviation    Wrist pronation    Wrist supination    (Blank rows = not tested)  COORDINATION: 9 Hole Peg test: Right: 36.54 sec; Left: 34.06 sec Box and Blocks:  Right 43 blocks, Left 36blocks - Pt requiring cues x3 to remember to only pick up one block at a time when completing with Right; requiring cues to go over barrier instead of around (possibly slowing down pace even more).  SENSATION: WFL  COGNITION: Overall cognitive status: Impaired and History of cognitive impairments - at baseline Pt able to recall 3 words: sock blue bed after 5 mins  Pt with difficulty with word finding and memory  VISION: Subjective report: wears glasses, reported f/u with neurology and ophthalmologist who  report to wait for further assessment to allow recovery s/p CVA Baseline vision: Wears glasses all the time  VISION ASSESSMENT: To be further assessed in functional context Eye alignment: WFL Ocular ROM: WFL Gaze preference/alignment: WDL Tracking/Visual pursuits: TBA Visual Fields: TBA in functional context   Bell cancellation test: locating 32/35 in 3:00, pt then able to locate remaining 2/3 in 4:20 before stating I think that is all  PERCEPTION: Impaired  PRAXIS: Impaired: Initiation, Motor planning, and Organization  OBSERVATIONS: Difficult to assess vision, attention, and strength as pt requiring increased cueing and repeat directions.  Unsure full attention and effort during MMT this session.  Pt forgetting instructions during coordination assessment, requiring wait time for recall and/or additional  cues.                                                                                                                             TREATMENT DATE:  02/21/24 Jigsaw puzzle: pt completed 10 piece puzzle with use of picture for sequencing and organization.  Pt requiring mild increased time due to odd shape of puzzle. Pt with difficulty naming aquatic animals, even after completing puzzle with aquatic animals on it - question if more anomia vs memory. Recall and sequencing: engaged in 2-4 step command tasks with pt demonstrating improved recall of steps when repeated by therapist x2 and then by pt aloud.  Pt with increased recall of multi- step commands with details of specific numbers.  Pt also demonstrating good recall of motor sequencing tasks.  Pt stopping intermittently during tasks, looking to therapist for input, but able to complete without any additional cues during tasks this session.  Reviewed functional sequencing tasks such as emptying dishwasher, washing clothes, etc with pt reporting good sequencing of each at home. Recall: engaged in scavenger hunt to challenge memory with pt able to recall 4 of 5 items after initial repetition (as above).      02/19/24 Sequencing: Pt able to demonstrate appropriate sequencing for texting and making a phone call.  Pt still with anomia impacting ability to verbalize sequencing but able to demonstrate. Phone use: engaged in discussion about removing excess apps (to a different page vs deleting) and removing alerts for social media to allow for increased ability to manage most used functions on phone.  Pt's spouse rearranging apps during session with plan to assess if improved ease and attention to task with decreased visual distractions. Motor/cognitive dual tasking: engaged in use of sequencing cards with pt completing 3 step motor commands with good sequencing and recall.  OT then providing pt with additional 3 step sequencing task to challenge recall with pt  requiring increased time but able to recall all 3 tasks correctly.     02/15/24 Recall and multi-tasking: engaged in Speed card game with focus on pt's recall from previous session.  OT had pt setup cards vs instructing therapist in how to setup to challenge recall.  Pt's spouse had printed setup instructions,  but pt not having to refer to them.  Pt with good sequencing initially, however with repetition pt starting to second guess movements and looking to spouse for feedback.  Completed additional round with pt demonstrating improved sequencing and allowing brief pauses to reflect.  Motor/cognitive dual tasking: engaged in ambulating while tossing ball to self and naming food items in alphabetical order.  Pt requiring mod-max question cues for sequencing of toss/catch while naming items and cues for word selection.  Engaged in increased cognitive challenge incorporating recall of previously named items and without category.  OT encouraged use of categories for increased success.    PATIENT EDUCATION: Education details: see today's tx above Person educated: Patient and Spouse Education method: Explanation and Handouts Education comprehension: verbalized understanding and needs further education  HOME EXERCISE PROGRAM: Provided written suggestions for keeping thinking skills sharp - plan to provide printout at future session 12/13/23 - memory compensation strategies and activities to keep thinking skills sharp (see pt instructions)   GOALS: Goals reviewed with patient? Yes  SHORT TERM GOALS: Target date: 12/15/23  Pt will be independent with coordination HEP with visual handouts for recall. Baseline: new to OPOT Goal status: in progress  2.  Pt will demonstrate understanding of memory compensations and ways to keep thinking skills sharp Baseline: STM deficits and impaired initiation 01/08/24 - pt utilizing writing information down,  WARM strategy, and engaging in logic puzzles with  assistance from spouse for attention, sequencing, and deductive reasoning.   Goal status: MET  3.  Pt will demonstrate and/or verbalize understanding of visual attention exercises and compensatory strategies for improved visual scanning and attention. Baseline: left field cut 01/08/24 - pt and spouse report that this does not seem to be an area of concern any longer Goal status: MET, however would benefit from f/u  4.  Pt will demonstrate improved coordination, attention to task, and recall of instructions by completing box and blocks assessment with improved score by 6 blocks bilaterally. Baseline: Right 43 blocks, Left 36blocks - Pt requiring cues x3 to remember to only pick up one block at a time when completing with Right; requiring cues to go over barrier instead of around (possibly slowing down pace even more). 01/08/24: Right 41 blocks, Left 42 blocks - Pt demonstrating improved recall of instructions with only initial instruction Goal status: partially met: not met on RUE, met with LUE    LONG TERM GOALS: Target date: 03/04/24  Pt will verbalize understanding of task modifications, adaptive strategies, and/or potential AE needs to increase ease, safety, and independence w/ IADLs. Baseline: decreased engagement in IADLs due to impaired memory and attention Goal status: in progress  2.  Pt will complete simulated medication management activity w/ Supervision by discharge, incorporating compensatory strategies/AE prn Baseline: spouse managing meds at this time, was not prior to CVA Goal status: in progress  3.  Pt will demonstrate ability to sequence simple functional task (simple snack prep, laundry task, etc) at Mod I level with good safety awareness and ability to utilize memory strategies for recall. Baseline:  Goal status: in progress  4.  Pt will navigate a moderately busy environment, completing dual task activity and/or following multi-step commands with 90% accuracy Baseline:   Goal status: in progress  5.  Pt will demonstrate improved initiation and sequencing to be able to navigate use of personal cell phone and send text messages in <15 mins. Baseline: spouse reporting 45 mins to reply to test message Goal status: in progress  ASSESSMENT:  CLINICAL IMPRESSION: Pt is demonstrating improvements in sequencing, especially with motor tasks that do not require naming items.  Pt demonstrating increased recall of sequencing and items to locate on scavenger hunt.  Pt does continue to benefit from increased time for sequencing and processing of information, however question if more from a language standpoint (as SLP working with pt on word finding and receptive tasks).  Pt and spouse receptive to education on continued dual tasking and/or incorporation of cognitive challenges into familiar, routine tasks to continue to focus on cognitive/motor dual tasking  Pt will continue to benefit from additional skilled occupational therapy visits to allow for further focus on sequencing, recall, deductive reasoning with functional tasks to increase participation in IADLs.   PERFORMANCE DEFICITS: in functional skills including ADLs, IADLs, coordination, strength, Fine motor control, Gross motor control, endurance, decreased knowledge of precautions, vision, and UE functional use, cognitive skills including attention, energy/drive, memory, problem solving, safety awareness, and sequencing, and psychosocial skills including environmental adaptation and routines and behaviors.     PLAN:  OT FREQUENCY: 1-2x/week  OT DURATION: 6 weeks (asking 8 weeks for scheduling)  PLANNED INTERVENTIONS: 02831 OT Re-evaluation, 97535 self care/ADL training, 02889 therapeutic exercise, 97530 therapeutic activity, 97112 neuromuscular re-education, functional mobility training, visual/perceptual remediation/compensation, psychosocial skills training, energy conservation, coping strategies training,  patient/family education, and DME and/or AE instructions  RECOMMENDED OTHER SERVICES: SLP  CONSULTED AND AGREED WITH PLAN OF CARE: Patient and family member/caregiver  PLAN FOR NEXT SESSION:   Cognitive/motor dual tasking, sequencing, recall of 2-3 step directions  Phone use  Further assess during functional tasks, review PRN visual compensatory strategies (especially during functional tasks)    KAYLENE DOMINO, OTR/L 02/21/2024, 3:24 PM  [facility] [address] [place], [place] [address] Phone # [phone] Fax # [phone]

## 2024-02-22 ENCOUNTER — Ambulatory Visit

## 2024-02-22 DIAGNOSIS — I639 Cerebral infarction, unspecified: Secondary | ICD-10-CM

## 2024-02-22 LAB — CUP PACEART REMOTE DEVICE CHECK
Date Time Interrogation Session: 20250806233848
Implantable Pulse Generator Implant Date: 20250327

## 2024-02-23 ENCOUNTER — Ambulatory Visit: Payer: Self-pay | Admitting: Cardiology

## 2024-02-26 ENCOUNTER — Ambulatory Visit

## 2024-02-26 ENCOUNTER — Ambulatory Visit: Admitting: Occupational Therapy

## 2024-02-26 DIAGNOSIS — R41840 Attention and concentration deficit: Secondary | ICD-10-CM | POA: Diagnosis not present

## 2024-02-26 DIAGNOSIS — I69318 Other symptoms and signs involving cognitive functions following cerebral infarction: Secondary | ICD-10-CM

## 2024-02-26 DIAGNOSIS — R41841 Cognitive communication deficit: Secondary | ICD-10-CM

## 2024-02-26 DIAGNOSIS — R4701 Aphasia: Secondary | ICD-10-CM

## 2024-02-26 NOTE — Therapy (Signed)
 OUTPATIENT SPEECH LANGUAGE PATHOLOGY TREATMENT   [name] MRN: [id number] DOB:Mar 19, 1951, 73 y.o., female Today's Date: 02/26/2024  PCP: Merna Huxley, NP REFERRING PROVIDER: Carilyn Prentice BRAVO, MD  END OF SESSION:  End of Session - 02/26/24 1409     Visit Number 13    Number of Visits 17    Date for SLP Re-Evaluation 03/20/24    SLP Start Time 1405    SLP Stop Time  1445    SLP Time Calculation (min) 40 min    Activity Tolerance Patient tolerated treatment well                 Past Medical History:  Diagnosis Date   Acute combined systolic and diastolic heart failure 10/11/2023   ADHD (attention deficit hyperactivity disorder) 05/09/2007   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; multiple punctate foci of restricted diffusion in bilateral frontal lobes and the left parietal lobe   Diverticulitis of colon (without mention of hemorrhage) 09/17/2013   Essential hypertension 10/09/2017   HFrEF (heart failure with reduced ejection fraction) 10/10/2023   History of COVID-19 2021   Hyperlipidemia    Hypothyroidism    Low back pain radiating to right leg 10/09/2017   Mild cognitive impairment of uncertain or unknown etiology 08/04/2022   Mild intermittent asthma with acute exacerbation 05/30/2017   Mild neurocognitive disorder due to multiple etiologies 12/19/2023   NSVT (nonsustained ventricular tachycardia) 10/12/2023   Osteoarthritis    Postmenopausal atrophic vaginitis, severe 08/18/2008   Right knee pain 03/13/2014   S/P arthroscopy of right knee 06/03/2014   Vitamin B12 deficiency    Past Surgical History:  Procedure Laterality Date   ABDOMINAL HYSTERECTOMY     BREAST SURGERY     CARPAL TUNNEL RELEASE Bilateral    CERVICAL DISC ARTHROPLASTY     CESAREAN SECTION     x2   left shoulder surgery     LOOP RECORDER INSERTION N/A 10/12/2023   Procedure: LOOP RECORDER INSERTION;  Surgeon: Cindie Ole DASEN, MD;   Location: [facility];  Service: Cardiovascular;  Laterality: N/A;   MENISCUS REPAIR Right 2017   REDUCTION MAMMAPLASTY Bilateral    right shoulder     TONSILLECTOMY     Patient Active Problem List   Diagnosis Date Noted   Mild neurocognitive disorder due to multiple etiologies 12/19/2023   NSVT (nonsustained ventricular tachycardia) 10/12/2023   Acute combined systolic and diastolic heart failure 10/11/2023   HFrEF (heart failure with reduced ejection fraction) 10/10/2023   CVA (cerebral vascular accident) 10/08/2023   Mild cognitive impairment of uncertain or unknown etiology 08/04/2022   Vitamin B12 deficiency    Osteoarthritis 08/03/2022   Hyperlipidemia 08/03/2022   Essential hypertension 10/09/2017   Low back pain radiating to right leg 10/09/2017   Mild intermittent asthma with acute exacerbation 05/30/2017   S/P arthroscopy of right knee 06/03/2014   Right knee pain 03/13/2014   Diverticulitis of colon (without mention of hemorrhage) 09/17/2013   Postmenopausal atrophic vaginitis, severe 08/18/2008   Hypothyroidism 05/09/2007   ADHD (attention deficit hyperactivity disorder) 05/09/2007     ONSET DATE:  10/08/23  REFERRING DIAG: P30.679 (ICD-10-CM) - Aphasia as late effect of stroke  THERAPY DIAG:  Cognitive communication deficit  Aphasia  Rationale for Evaluation and Treatment: Rehabilitation  SUBJECTIVE:   SUBJECTIVE STATEMENT: Pt and husband do not notice notable difference in pt's attention between Adderall and Wellbutrin .     Pt accompanied by: self  PERTINENT HISTORY: HPI: Pt presented to [facility] ED on 10/08/2023 with sudden onset of left facial droop and left-sided weakness. ADHD, HTN, diverticulitis, HLD, hypothyroidism, low back pain, neck fusion, mild cognitive impairment (dx Jan 2024), cervical disc arthroplasty, OA in R knee, asthma   PAIN:  Are you having pain? No  FALLS: Has patient fallen in last 6 months?  No  PATIENT GOALS: Improve  language function, memory  OBJECTIVE:  Note: Objective measures were completed at Evaluation unless otherwise noted.  DIAGNOSTIC FINDINGS:  MRI 10/09/23 IMPRESSION: 1. Findings compatible with multiple punctate acute infarcts in bilateral frontal and left parietal lobes. Given involvement of multiple vascular territories, consider an embolic etiology. 2. Significantly motion limited study.   Date of Discharge from SLP service:October 19, 2023 Clinical Impression/Discharge Summary: Pt has made great gains and has met 4 of 6 LTG's this admission due to improved cognition and language. Pt is currently an overall mod I A for orientation tasks, though requires up to [facility] for attention. Patient requires mod I A for expressive language, though continues to require occasional min-modA for complex receptive language tasks.  Pt/family education complete and pt will discharge home with 24 hour supervision from friends/family/etc. Pt would benefit from OP f/u ST services to maximize cognition and communication in order to maximize functional independence.     STANDARDIZED ASSESSMENTS:   Cognitive Linguistic Quick Test  AGE - 70-89  The Cognitive Linguistic Quick Test (CLQT) was administered to assess the relative status of five cognitive domains: attention, memory, language, executive functioning, and visuospatial skills. Scores from 10 tasks were used to estimate severity ratings (standardized for age groups 18-69 years and 70-89 years) for each domain, a clock drawing task, as well as an overall composite severity rating of cognition.     Task Score Criterion Cut Scores  Personal Facts 8/8 8  Symbol Cancellation 12/12 10  Confrontation Naming 10/10 10  Clock Drawing  12/13 11  Story Retelling 8/10 5  Symbol Trails 10/10 6  Generative Naming 3/9 4  Design Memory 4/6 4  Mazes  4/8 4  Design Generation 3/13 5   Cognitive Domain Composite Score Severity Rating  Attention 181/215 WNL  Memory 147/185  WNL  Executive Function 20/40 Low-WNL  Language 29/37 Low-WNL  Visuospatial Skills 75/105 WNL  Clock Drawing  12/13 WNL  Composite Severity Rating  WNL     PATIENT REPORTED OUTCOME MEASURES (PROM): Communication Effectiveness Survey: 12/20/23 - pt scored herself 18/32, with higher scores indicating less of a negative effect of pt's deficits on daily life and QOL.                                                                                                                              TREATMENT DATE:  Semantic Feature Analysis (SFA)  02/26/24:  SLP assisted pt with error awareness and reviewed homework with her - pt req'd min-mod A for awareness with this task (map). Her  groceries task demonstrated min decr'd organization, req'd mod A for error awareness, and she req'd mod semantic cues during this task to generate dairy due to anomia. SLP encouraged pt and [name] to cont SFA at home PRN. They will complete smoke alarm with SFA.   02/19/24: SLP assisted pt in problem solving with [employer] given the information she found. Initially she felt like she could keep up but SLP talked further with pt about prophecy books and she decided this was not best for her to participate. Pt had difficulty arriving at a way she could participate but not necessarily do al lthe work necessary and SLP helped pt realize other options (problem solve). Pt is  now on Wellbutrin , which she and [name] will assess if attention is improved or same with this med compared to Adderall.   02/15/24: Pt with discussion with PCP this afternoon about meds, Adderall is not recommended for her due to cardiac health. Pt's attention has decr'd since CVA but pt's attention med was reduced to half dose than prior to CVA. SLP took time to explain/educate pt and husband why an attention med would cont to be helpful for pt from a therapeutic perspective. SLP ascertained that pt's anomia has improved but not dramatically since  her hospital d/c for CVA. SLP to cont with pt for anomia/verbal expression and for problem solving in a functional context. Pt and husband agree with this plan.   02/02/24: SLP worked with pt today with word finding using divergent naming tasks with salient categories for pt. SLP req'd to provide mod cues occasionally for pt's success. SLP wonders if pt's premorbid memory deficit/MCI is impeding pt progress with anomia. SLP instructed pt to cont SFA and naming tasks at home. Pt completing word/language tasks with aphasia book at home, regularly.  01/23/24: Bunnie required incr'd cue level from [employer] as steps increased for her homework. Today SLP with long discussion what pt could complete with husband at home to work with china attention and organization. Suggested cooking, baking, or organizing pantry or junk drawer in the kitchen, with steps written out in an organizational manner prior to beginning her work.  SLP also discussed with pt and [name] about pt keeping her own calendar at this time, with [name] assisting PRN. SLP expained that over time pt would do this independently but at this time she will need George's assistance and that this was a stepping stone to her eventually keeping her own calendar independently like she did prior to CVA.   01/08/24: Pt with 3 hour visit this morning from sister, and brother (from [facility]). Today SLP helped pt with occasional mod A with problem solving/sequencing task. She req'd extra time and redirection at times with 13-step sequencing task. Took pt 17 minutes for this task. Level of fatigue at which pt entered may have played a role.  [name] had question about 6-step sequencing tasks- he could not find any online. SLP suggested he and pt think through some everyday tasks and write down 6 steps in them and SLP provided 7-8 examples.  01/03/24: Pt brought homework in completed. She enjoyed sequencing task. With simple problem solving tasks pt req'd rare min A, but  performance was functional.  Her speech today was WNL rate, without anomia. When she has anomia, she uses synonym or description strategy. SLP observed pt to use description strategy in previous sessions. Homework provided.  01/01/24: Pt did not complete any SFA since last session. SLP told pt to complete when  she has anomia with a noun. Pt brought homework with her completed. She finished 40 wrtiten definitions in approx 90 minutes. Today SLP targeted simple problem solving with 5-step sequencing task. Pt took almost 10 minutes to complete 4 sequences. She was provided with homework until next session.  12/28/23: Pt brought in books that daughter bought her. SLP to look at these and star pages SLP thinks would be helpful for pt. SLP worked with pt today with SFA - grill. She req'd mod A occasionally for procedure and for more accurate wording, especially description. SLP provided homework for description.  12/25/23:  Minimal difficulty with letter fill in homework. Focusing on problem solving in  cognitive linguistics today, SLP assisted pt with scrambled sentences.Four to 5-word sentences were completed with 4/4 success. When pt worked on 7-9 word sentences with SLP she had intermittent difficulty with mental flexibility with word choice. SLP provided mod cues faded to min nonverbal cues. Pt's attention/memory deficit made alternating attention difficult at times so SLP suggested pt write the word order above the words and this helped pt compensate for decr'd alternating attention.  SLP provided compensations for anomia for pt and educated her and [name] about these.   12/20/23: PROM provided today, with score above. [name] cueing pt about the accuracy of her answers. Needs anomia compensations next session. SLP asked [name] to fill out a CES as well for SLP to compare. Pt completed 3 clues task with 90% success, however only named average 2 items in a common category for divergent naming prior to requiring  consistent mod-max  A. SLP provided letter fill ins for homework, and divergent naming for flowers in her yard, and for sports.  12/14/23: PT NEEDS PROM NEXT SESSION. SLP completed CLQT today. From pt's CLQT score it would indicate that although pt's Composite Score is WNL she had difficulty with processing speed (mazes, design generation), problem solving Forensic psychologist), memory Training and development officer memory), and language (generative naming). Goals added.   12/07/23: SLP educated pt and husband [name]) with describing phonemic cues, as this appeared to help pt during BNT. SLP provided examples during BNT and told [name] to assist pt in this way if he is sure of the word pt means to say. Secondly, SLP educated pt and husband on how to complete semantic feature analysis (SFA) at home with words/nouns pt experiences anomia with. SLP explained benefits of SFA to pt and husband. [name] took notes during session. SLP provided handouts for this framework.  PATIENT EDUCATION: Education details: see treatment date Person educated: Patient and Spouse Education method: Medical illustrator Education comprehension: verbalized understanding and needs further education   GOALS: Goals reviewed with patient? Yes in general  SHORT TERM GOALS: Target date: 01/05/24  Pt will complete cognitive linguistic assessment in first 2 sessions  Baseline: Goal status: met  2.  Pt will successfully use anomia compensations with min A to do so, for 10 minutes functional simple conversation in 2 sessions Baseline:  Goal status: partially met  3.  Pt will demo how to use SFA with rare min A in 3 sessions Baseline: 01/03/24 Goal status: partially met  4.  Pt will demo [facility] problem solving skills in simple cognitive linguistic tasks in 2 sessions Baseline: 01/03/24 Goal status: partially met   LONG TERM GOALS: Target date:  03/20/24  Pt will improve PROM compared to initial administration Baseline:  Goal status:  INITIAL  2.  Pt will participate in 10 minutes mod complex conversation with functional expressive  language (using anomia compensations) in 3 sessions Baseline:  Goal status: INITIAL  3.  Pt will demo functional problem solving skills in practical cognitive linguistic tasks in 3 sessions Baseline:  Goal status: INITIAL   ASSESSMENT:  CLINICAL IMPRESSION: Patient is a 73 y.o. F who was seen today for treatment of aphasia and problem solving in light of bilateral frontal CVA, and parietal CVA. See treatment date above for today's date for further details on today's session. Given her premorbid dx of ADHD and MCI and OT focus on other areas identified on CLQT, SLP will focus on problem solving in functional cognitive linguistic tasks. She also demonstrated s/sx of cognitive linguistic impairment requiring a full evaluation in the first 2 sessions. Pt with premorbid dx of ADHD and MCI.   OBJECTIVE IMPAIRMENTS: include attention, memory, awareness, and aphasia. These impairments are limiting patient from managing medications, managing appointments, managing finances, household responsibilities, ADLs/IADLs, and effectively communicating at home and in community. Factors affecting potential to achieve goals and functional outcome are ability to learn/carryover information and previous level of function. Patient will benefit from skilled SLP services to address above impairments and improve overall function.  REHAB POTENTIAL: Good  PLAN:  SLP FREQUENCY: 2x/week  SLP DURATION: 17 sessions total  PLANNED INTERVENTIONS: Language facilitation, Environmental controls, Cueing hierachy, Cognitive reorganization, Internal/external aids, Functional tasks, Multimodal communication approach, SLP instruction and feedback, Compensatory strategies, Patient/family education, [phone] Treatment of speech (30 or 45 min) , and [id number] (Standardized cognitive assessment)    Raliyah Montella, CCC-SLP 02/26/2024, 5:31  PM

## 2024-02-26 NOTE — Therapy (Signed)
 OUTPATIENT OCCUPATIONAL THERAPY NEURO Treatment  [name] MRN: [id number] DOB:1951-03-10, 73 y.o., female Today's Date: 02/26/2024  PCP: Merna Huxley, NP REFERRING PROVIDER: Carilyn Prentice BRAVO, MD  END OF SESSION:  OT End of Session - 02/26/24 1321     Visit Number 15    Number of Visits 16    Date for OT Re-Evaluation 03/04/24    Authorization Type [employer]    OT Start Time 1318    OT Stop Time 1400    OT Time Calculation (min) 42 min    Activity Tolerance Patient tolerated treatment well    Behavior During Therapy WFL for tasks assessed/performed                       Past Medical History:  Diagnosis Date   Acute combined systolic and diastolic heart failure 10/11/2023   ADHD (attention deficit hyperactivity disorder) 05/09/2007   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; multiple punctate foci of restricted diffusion in bilateral frontal lobes and the left parietal lobe   Diverticulitis of colon (without mention of hemorrhage) 09/17/2013   Essential hypertension 10/09/2017   HFrEF (heart failure with reduced ejection fraction) 10/10/2023   History of COVID-19 2021   Hyperlipidemia    Hypothyroidism    Low back pain radiating to right leg 10/09/2017   Mild cognitive impairment of uncertain or unknown etiology 08/04/2022   Mild intermittent asthma with acute exacerbation 05/30/2017   Mild neurocognitive disorder due to multiple etiologies 12/19/2023   NSVT (nonsustained ventricular tachycardia) 10/12/2023   Osteoarthritis    Postmenopausal atrophic vaginitis, severe 08/18/2008   Right knee pain 03/13/2014   S/P arthroscopy of right knee 06/03/2014   Vitamin B12 deficiency    Past Surgical History:  Procedure Laterality Date   ABDOMINAL HYSTERECTOMY     BREAST SURGERY     CARPAL TUNNEL RELEASE Bilateral    CERVICAL DISC ARTHROPLASTY     CESAREAN SECTION     x2   left shoulder surgery      LOOP RECORDER INSERTION N/A 10/12/2023   Procedure: LOOP RECORDER INSERTION;  Surgeon: Cindie Ole DASEN, MD;  Location: [facility];  Service: Cardiovascular;  Laterality: N/A;   MENISCUS REPAIR Right 2017   REDUCTION MAMMAPLASTY Bilateral    right shoulder     TONSILLECTOMY     Patient Active Problem List   Diagnosis Date Noted   Mild neurocognitive disorder due to multiple etiologies 12/19/2023   NSVT (nonsustained ventricular tachycardia) 10/12/2023   Acute combined systolic and diastolic heart failure 10/11/2023   HFrEF (heart failure with reduced ejection fraction) 10/10/2023   CVA (cerebral vascular accident) 10/08/2023   Mild cognitive impairment of uncertain or unknown etiology 08/04/2022   Vitamin B12 deficiency    Osteoarthritis 08/03/2022   Hyperlipidemia 08/03/2022   Essential hypertension 10/09/2017   Low back pain radiating to right leg 10/09/2017   Mild intermittent asthma with acute exacerbation 05/30/2017   S/P arthroscopy of right knee 06/03/2014   Right knee pain 03/13/2014   Diverticulitis of colon (without mention of hemorrhage) 09/17/2013   Postmenopausal atrophic vaginitis, severe 08/18/2008   Hypothyroidism 05/09/2007   ADHD (attention deficit hyperactivity disorder) 05/09/2007    ONSET DATE: 10/08/23  REFERRING DIAG: I63.9 (ICD-10-CM) - Cerebral infarction, unspecified  THERAPY DIAG:  Attention and concentration deficit  Other symptoms and signs involving cognitive functions following cerebral infarction  Rationale for Evaluation and Treatment: Rehabilitation  SUBJECTIVE:  SUBJECTIVE STATEMENT: Gabrielle Vargas reports changes to phone setup are working out fine.   Gabrielle Vargas accompanied by: self and spouse  PERTINENT HISTORY: ADHD, HTN, diverticulitis, HLD, hypothyroidism, low back pain, neck fusion, mild cognitive impairment, OA in R knee, asthma   presented to [facility] ED on 10/08/2023 with sudden onset of left facial droop, left-sided weakness,and L  visual cut. MRI: Multiple punctate acute infarcts bilateral frontal and left parietal lobes. EEG suggestive of moderate diffuse encephalopathy.   PRECAUTIONS: Fall  WEIGHT BEARING RESTRICTIONS: No  PAIN:  Are you having pain? No pain  FALLS: Has patient fallen in last 6 months? No  LIVING ENVIRONMENT: Lives with: lives with their spouse Lives in: House/apartment Stairs: Yes: Internal: bedroom/bathroom on main floor, does have a full flight of steps - spouse's office is upstairs  steps; one rail and External: 2 steps;   Has following equipment at home: shower chair and hand held shower head  PLOF: Independent, Independent with basic ADLs, and Leisure: walking up to 4-6 hours per day  PATIENT GOALS: I really don't know  OBJECTIVE:  Note: Objective measures were completed at Evaluation unless otherwise noted.  HAND DOMINANCE: Right  ADLs: Overall ADLs: Mod I with bathing and dressing, Gabrielle Vargas's spouse will go in and out of bathroom while Gabrielle Vargas in shower providing distant supervision to Mod I level Transfers/ambulation related to ADLs: Mod I without AD, getting into high bed without any issue  IADLs: Shopping: Gabrielle Vargas gets overwhelmed with everything that is going on, easily distracted  Meal Prep: spouse is doing most of the cooking right now as Gabrielle Vargas forgetful with sequencing and ingredients, and easily distracted Community mobility: not cleared to drive at this time Medication management: spouse is assisting now, he will fill up a daily pill box for Gabrielle Vargas   MOBILITY STATUS: Mod I without AD  POSTURE COMMENTS:  No Significant postural limitations  ACTIVITY TOLERANCE: Activity tolerance: Gabrielle Vargas reports worn out after 30 min walk (where as they would walk up to 2 hrs at a time before stroke).  Gabrielle Vargas is not sleeping as much during the day time.  FUNCTIONAL OUTCOME MEASURES: 9 hole peg test: Right: 36.54 sec; Left: 34.06 sec Trail Making: 42.47 sec for Trail A   UPPER EXTREMITY ROM:  WFL  bilaterally  UPPER EXTREMITY MMT:     MMT Right eval Left eval  Shoulder flexion 4+ 4-  Shoulder abduction    Shoulder adduction    Shoulder extension    Shoulder internal rotation    Shoulder external rotation    Middle trapezius    Lower trapezius    Elbow flexion 4 4  Elbow extension 4- 4-  Wrist flexion    Wrist extension    Wrist ulnar deviation    Wrist radial deviation    Wrist pronation    Wrist supination    (Blank rows = not tested)  COORDINATION: 9 Hole Peg test: Right: 36.54 sec; Left: 34.06 sec Box and Blocks:  Right 43 blocks, Left 36blocks - Gabrielle Vargas requiring cues x3 to remember to only pick up one block at a time when completing with Right; requiring cues to go over barrier instead of around (possibly slowing down pace even more).  SENSATION: WFL  COGNITION: Overall cognitive status: Impaired and History of cognitive impairments - at baseline Gabrielle Vargas able to recall 3 words: sock blue bed after 5 mins  Gabrielle Vargas with difficulty with word finding and memory  VISION: Subjective report: wears glasses, reported f/u with neurology and ophthalmologist who  report to wait for further assessment to allow recovery s/p CVA Baseline vision: Wears glasses all the time  VISION ASSESSMENT: To be further assessed in functional context Eye alignment: WFL Ocular ROM: WFL Gaze preference/alignment: WDL Tracking/Visual pursuits: TBA Visual Fields: TBA in functional context   Bell cancellation test: locating 32/35 in 3:00, Gabrielle Vargas then able to locate remaining 2/3 in 4:20 before stating I think that is all  PERCEPTION: Impaired  PRAXIS: Impaired: Initiation, Motor planning, and Organization  OBSERVATIONS: Difficult to assess vision, attention, and strength as Gabrielle Vargas requiring increased cueing and repeat directions.  Unsure full attention and effort during MMT this session.  Gabrielle Vargas forgetting instructions during coordination assessment, requiring wait time for recall and/or additional  cues.                                                                                                                             TREATMENT DATE:  02/26/24 Pill box assessment: Gabrielle Vargas completed pill box test with 2 errors, in 11:18 minutes.  Gabrielle Vargas placing an additional pill in 2 slots as she had forgotten when she left off when she went back to get more of same medication.  Gabrielle Vargas demonstrating good sequencing and process of checks and balances by setting completed pill bottles to R and flipping upside down.  Gabrielle Vargas did take a prolonged pause during 3 times per day medication, looking to therapist for clarification but talking it out on her own.    Functional cognition further assessed with The Pillbox Test: A Measure of Executive Functioning and Estimate of Medication Management. A straight pass/fail designation is determined by 3 or more errors of omission or misplacement on the task. The Gabrielle Vargas completed the test with 0 errors. Trail making: completed Trail B: 2:54 with initial significant improvement in sequencing and alternating attention through 8 - H but then questioning self through remainder of numbers/letters.  OT educating on functional carryover of task with cooking, particularly when cooking 2 or 3 items at the same time.  OT encouraging Gabrielle Vargas to attempt cooking 2 items for meal simultaneously.      02/21/24 Jigsaw puzzle: Gabrielle Vargas completed 10 piece puzzle with use of picture for sequencing and organization.  Gabrielle Vargas requiring mild increased time due to odd shape of puzzle. Gabrielle Vargas with difficulty naming aquatic animals, even after completing puzzle with aquatic animals on it - question if more anomia vs memory. Recall and sequencing: engaged in 2-4 step command tasks with Gabrielle Vargas demonstrating improved recall of steps when repeated by therapist x2 and then by Gabrielle Vargas aloud.  Gabrielle Vargas with increased recall of multi- step commands with details of specific numbers.  Gabrielle Vargas also demonstrating good recall of motor sequencing tasks.  Gabrielle Vargas stopping  intermittently during tasks, looking to therapist for input, but able to complete without any additional cues during tasks this session.  Reviewed functional sequencing tasks such as emptying dishwasher, washing clothes, etc with Gabrielle Vargas reporting good sequencing of each at home. Recall: engaged  in scavenger hunt to challenge memory with Gabrielle Vargas able to recall 4 of 5 items after initial repetition (as above).      02/19/24 Sequencing: Gabrielle Vargas able to demonstrate appropriate sequencing for texting and making a phone call.  Gabrielle Vargas still with anomia impacting ability to verbalize sequencing but able to demonstrate. Phone use: engaged in discussion about removing excess apps (to a different page vs deleting) and removing alerts for social media to allow for increased ability to manage most used functions on phone.  Gabrielle Vargas's spouse rearranging apps during session with plan to assess if improved ease and attention to task with decreased visual distractions. Motor/cognitive dual tasking: engaged in use of sequencing cards with Gabrielle Vargas completing 3 step motor commands with good sequencing and recall.  OT then providing Gabrielle Vargas with additional 3 step sequencing task to challenge recall with Gabrielle Vargas requiring increased time but able to recall all 3 tasks correctly.     PATIENT EDUCATION: Education details: see today's tx above Person educated: Patient and Spouse Education method: Explanation and Handouts Education comprehension: verbalized understanding and needs further education  HOME EXERCISE PROGRAM: Provided written suggestions for keeping thinking skills sharp - plan to provide printout at future session 12/13/23 - memory compensation strategies and activities to keep thinking skills sharp (see Gabrielle Vargas instructions)   GOALS: Goals reviewed with patient? Yes  SHORT TERM GOALS: Target date: 12/15/23  Gabrielle Vargas will be independent with coordination HEP with visual handouts for recall. Baseline: new to OPOT Goal status: in progress  2.  Gabrielle Vargas will  demonstrate understanding of memory compensations and ways to keep thinking skills sharp Baseline: STM deficits and impaired initiation 01/08/24 - Gabrielle Vargas utilizing writing information down,  WARM strategy, and engaging in logic puzzles with assistance from spouse for attention, sequencing, and deductive reasoning.   Goal status: MET  3.  Gabrielle Vargas will demonstrate and/or verbalize understanding of visual attention exercises and compensatory strategies for improved visual scanning and attention. Baseline: left field cut 01/08/24 - Gabrielle Vargas and spouse report that this does not seem to be an area of concern any longer Goal status: MET, however would benefit from f/u  4.  Gabrielle Vargas will demonstrate improved coordination, attention to task, and recall of instructions by completing box and blocks assessment with improved score by 6 blocks bilaterally. Baseline: Right 43 blocks, Left 36blocks - Gabrielle Vargas requiring cues x3 to remember to only pick up one block at a time when completing with Right; requiring cues to go over barrier instead of around (possibly slowing down pace even more). 01/08/24: Right 41 blocks, Left 42 blocks - Gabrielle Vargas demonstrating improved recall of instructions with only initial instruction Goal status: partially met: not met on RUE, met with LUE    LONG TERM GOALS: Target date: 03/04/24  Gabrielle Vargas will verbalize understanding of task modifications, adaptive strategies, and/or potential AE needs to increase ease, safety, and independence w/ IADLs. Baseline: decreased engagement in IADLs due to impaired memory and attention Goal status: in progress  2.  Gabrielle Vargas will complete simulated medication management activity w/ Supervision by discharge, incorporating compensatory strategies/AE prn Baseline: spouse managing meds at this time, was not prior to CVA 02/26/24: Gabrielle Vargas completing with good sequencing, use of checks and balances, however requires significant increased time therefore continue to recommend meds be completed with  supervision/with spouse Goal status: MET  3.  Gabrielle Vargas will demonstrate ability to sequence simple functional task (simple snack prep, laundry task, etc) at Mod I level with good safety awareness and ability to utilize memory  strategies for recall. Baseline:  Goal status: in progress  4.  Gabrielle Vargas will navigate a moderately busy environment, completing dual task activity and/or following multi-step commands with 90% accuracy Baseline:  Goal status: in progress  5.  Gabrielle Vargas will demonstrate improved initiation and sequencing to be able to navigate use of personal cell phone and send text messages in <15 mins. Baseline: spouse reporting 45 mins to reply to test message Goal status: in progress   ASSESSMENT:  CLINICAL IMPRESSION: Gabrielle Vargas is demonstrating improvements in sequencing and recall with use of memory strategies (writing things down) and system of checks and balances.  Gabrielle Vargas does continue to benefit from increased time for sequencing and processing of information, occasionally looking to therapist for input but able to problem solve independently without little to no errors. Gabrielle Vargas and spouse receptive to education on continued dual tasking with meal prep and increased involvement in routine tasks to continue to focus on cognitive/motor dual tasking     PERFORMANCE DEFICITS: in functional skills including ADLs, IADLs, coordination, strength, Fine motor control, Gross motor control, endurance, decreased knowledge of precautions, vision, and UE functional use, cognitive skills including attention, energy/drive, memory, problem solving, safety awareness, and sequencing, and psychosocial skills including environmental adaptation and routines and behaviors.     PLAN:  OT FREQUENCY: 1-2x/week  OT DURATION: 6 weeks (asking 8 weeks for scheduling)  PLANNED INTERVENTIONS: 02831 OT Re-evaluation, 97535 self care/ADL training, 02889 therapeutic exercise, 97530 therapeutic activity, 97112 neuromuscular re-education,  functional mobility training, visual/perceptual remediation/compensation, psychosocial skills training, energy conservation, coping strategies training, patient/family education, and DME and/or AE instructions  RECOMMENDED OTHER SERVICES: SLP  CONSULTED AND AGREED WITH PLAN OF CARE: Patient and family member/caregiver  PLAN FOR NEXT SESSION:   Cognitive/motor dual tasking, sequencing, recall of 2-3 step directions  Phone use  Further assess during functional tasks, review PRN visual compensatory strategies (especially during functional tasks)  Discharge after next session?    KAYLENE DOMINO, OTR/L 02/26/2024, 1:21 PM  [facility] at [facility] [address], [address] [place], [place] [address] Phone # [phone] Fax # [phone]

## 2024-02-27 ENCOUNTER — Other Ambulatory Visit: Payer: Self-pay | Admitting: Adult Health

## 2024-02-27 ENCOUNTER — Telehealth: Payer: Self-pay | Admitting: Cardiology

## 2024-02-27 DIAGNOSIS — Z1231 Encounter for screening mammogram for malignant neoplasm of breast: Secondary | ICD-10-CM

## 2024-02-27 NOTE — Telephone Encounter (Signed)
 Returned call to Pt.  Advised ok to proceed with mammogram.  Loop recorder would not interfere with mammogram, and mammogram will not cause false readings on loop.  Additionally, wife would like the card for her wallet.  She has not received yet.  Advised would follow up on getting her a card.  Post phone call-went online and ordered new card to be mailed to patient.  Await further needs.

## 2024-02-27 NOTE — Telephone Encounter (Signed)
 Pt needs to schedule a mammogram and would like to know if her loop device will interfere with procedure.

## 2024-02-28 ENCOUNTER — Ambulatory Visit: Admitting: Occupational Therapy

## 2024-02-28 ENCOUNTER — Telehealth: Payer: Self-pay | Admitting: Pharmacist

## 2024-02-28 ENCOUNTER — Ambulatory Visit

## 2024-02-28 DIAGNOSIS — R41840 Attention and concentration deficit: Secondary | ICD-10-CM | POA: Diagnosis not present

## 2024-02-28 DIAGNOSIS — I69318 Other symptoms and signs involving cognitive functions following cerebral infarction: Secondary | ICD-10-CM

## 2024-02-28 DIAGNOSIS — R41841 Cognitive communication deficit: Secondary | ICD-10-CM

## 2024-02-28 DIAGNOSIS — R4701 Aphasia: Secondary | ICD-10-CM

## 2024-02-28 NOTE — Therapy (Signed)
 OUTPATIENT SPEECH LANGUAGE PATHOLOGY TREATMENT   [name] MRN: [id number] DOB:July 14, 1951, 73 y.o., female Today's Date: 02/28/2024  PCP: Merna Huxley, NP REFERRING PROVIDER: Carilyn Prentice BRAVO, MD  END OF SESSION:  End of Session - 02/28/24 1426     Visit Number 14    Number of Visits 17    Date for SLP Re-Evaluation 03/20/24    SLP Start Time 1405    SLP Stop Time  1445    SLP Time Calculation (min) 40 min    Activity Tolerance Patient tolerated treatment well                 Past Medical History:  Diagnosis Date   Acute combined systolic and diastolic heart failure 10/11/2023   ADHD (attention deficit hyperactivity disorder) 05/09/2007   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; multiple punctate foci of restricted diffusion in bilateral frontal lobes and the left parietal lobe   Diverticulitis of colon (without mention of hemorrhage) 09/17/2013   Essential hypertension 10/09/2017   HFrEF (heart failure with reduced ejection fraction) 10/10/2023   History of COVID-19 2021   Hyperlipidemia    Hypothyroidism    Low back pain radiating to right leg 10/09/2017   Mild cognitive impairment of uncertain or unknown etiology 08/04/2022   Mild intermittent asthma with acute exacerbation 05/30/2017   Mild neurocognitive disorder due to multiple etiologies 12/19/2023   NSVT (nonsustained ventricular tachycardia) 10/12/2023   Osteoarthritis    Postmenopausal atrophic vaginitis, severe 08/18/2008   Right knee pain 03/13/2014   S/P arthroscopy of right knee 06/03/2014   Vitamin B12 deficiency    Past Surgical History:  Procedure Laterality Date   ABDOMINAL HYSTERECTOMY     BREAST SURGERY     CARPAL TUNNEL RELEASE Bilateral    CERVICAL DISC ARTHROPLASTY     CESAREAN SECTION     x2   left shoulder surgery     LOOP RECORDER INSERTION N/A 10/12/2023   Procedure: LOOP RECORDER INSERTION;  Surgeon: Cindie Ole DASEN, MD;   Location: [facility];  Service: Cardiovascular;  Laterality: N/A;   MENISCUS REPAIR Right 2017   REDUCTION MAMMAPLASTY Bilateral    right shoulder     TONSILLECTOMY     Patient Active Problem List   Diagnosis Date Noted   Mild neurocognitive disorder due to multiple etiologies 12/19/2023   NSVT (nonsustained ventricular tachycardia) 10/12/2023   Acute combined systolic and diastolic heart failure 10/11/2023   HFrEF (heart failure with reduced ejection fraction) 10/10/2023   CVA (cerebral vascular accident) 10/08/2023   Mild cognitive impairment of uncertain or unknown etiology 08/04/2022   Vitamin B12 deficiency    Osteoarthritis 08/03/2022   Hyperlipidemia 08/03/2022   Essential hypertension 10/09/2017   Low back pain radiating to right leg 10/09/2017   Mild intermittent asthma with acute exacerbation 05/30/2017   S/P arthroscopy of right knee 06/03/2014   Right knee pain 03/13/2014   Diverticulitis of colon (without mention of hemorrhage) 09/17/2013   Postmenopausal atrophic vaginitis, severe 08/18/2008   Hypothyroidism 05/09/2007   ADHD (attention deficit hyperactivity disorder) 05/09/2007     ONSET DATE:  10/08/23  REFERRING DIAG: P30.679 (ICD-10-CM) - Aphasia as late effect of stroke  THERAPY DIAG:  Cognitive communication deficit  Aphasia  Rationale for Evaluation and Treatment: Rehabilitation  SUBJECTIVE:   SUBJECTIVE STATEMENT: Pt returned homework when handed it by husband.     Pt accompanied by: self   PERTINENT HISTORY: HPI: Pt presented to  [facility] ED on 10/08/2023 with sudden onset of left facial droop and left-sided weakness. ADHD, HTN, diverticulitis, HLD, hypothyroidism, low back pain, neck fusion, mild cognitive impairment (dx Jan 2024), cervical disc arthroplasty, OA in R knee, asthma   PAIN:  Are you having pain? No  FALLS: Has patient fallen in last 6 months?  No  PATIENT GOALS: Improve language function, memory  OBJECTIVE:  Note:  Objective measures were completed at Evaluation unless otherwise noted.  DIAGNOSTIC FINDINGS:  MRI 10/09/23 IMPRESSION: 1. Findings compatible with multiple punctate acute infarcts in bilateral frontal and left parietal lobes. Given involvement of multiple vascular territories, consider an embolic etiology. 2. Significantly motion limited study.   Date of Discharge from SLP service:October 19, 2023 Clinical Impression/Discharge Summary: Pt has made great gains and has met 4 of 6 LTG's this admission due to improved cognition and language. Pt is currently an overall mod I A for orientation tasks, though requires up to [facility] for attention. Patient requires mod I A for expressive language, though continues to require occasional min-modA for complex receptive language tasks.  Pt/family education complete and pt will discharge home with 24 hour supervision from friends/family/etc. Pt would benefit from OP f/u ST services to maximize cognition and communication in order to maximize functional independence.     STANDARDIZED ASSESSMENTS:   Cognitive Linguistic Quick Test  AGE - 70-89  The Cognitive Linguistic Quick Test (CLQT) was administered to assess the relative status of five cognitive domains: attention, memory, language, executive functioning, and visuospatial skills. Scores from 10 tasks were used to estimate severity ratings (standardized for age groups 18-69 years and 70-89 years) for each domain, a clock drawing task, as well as an overall composite severity rating of cognition.     Task Score Criterion Cut Scores  Personal Facts 8/8 8  Symbol Cancellation 12/12 10  Confrontation Naming 10/10 10  Clock Drawing  12/13 11  Story Retelling 8/10 5  Symbol Trails 10/10 6  Generative Naming 3/9 4  Design Memory 4/6 4  Mazes  4/8 4  Design Generation 3/13 5   Cognitive Domain Composite Score Severity Rating  Attention 181/215 WNL  Memory 147/185 WNL  Executive Function 20/40 Low-WNL   Language 29/37 Low-WNL  Visuospatial Skills 75/105 WNL  Clock Drawing  12/13 WNL  Composite Severity Rating  WNL     PATIENT REPORTED OUTCOME MEASURES (PROM): Communication Effectiveness Survey: 12/20/23 - pt scored herself 18/32, with higher scores indicating less of a negative effect of pt's deficits on daily life and QOL.                                                                                                                              TREATMENT DATE:  Semantic Feature Analysis (SFA)  02/28/24: Pt reports no frustrations or problems as of now with cognition currently and she is satisfied to focus on anomia in ST beginning next session. SLP suggested pt keep track  of her own homework in a manner she desires. SLP assisted pt with deciding she could do this with her clipboard, and provide homework to SLP at beginning of sessions. SLP reviewed pt's homework with her. Pt unaware of 2 details on detailed written instructions. SLP gave consistent mod A for [place] . Homework provided for word finding.  02/26/24:  SLP assisted pt with error awareness and reviewed homework with her - pt req'd min-mod A for awareness with this task (map). Her groceries task demonstrated min decr'd organization, req'd mod A for error awareness, and she req'd mod semantic cues during this task to generate dairy due to anomia. SLP encouraged pt and [name] to cont SFA at home PRN. They will complete smoke alarm with SFA.   02/19/24: SLP assisted pt in problem solving with [employer] given the information she found. Initially she felt like she could keep up but SLP talked further with pt about prophecy books and she decided this was not best for her to participate. Pt had difficulty arriving at a way she could participate but not necessarily do al lthe work necessary and SLP helped pt realize other options (problem solve). Pt is  now on Wellbutrin , which she and [name] will assess if  attention is improved or same with this med compared to Adderall.   02/15/24: Pt with discussion with PCP this afternoon about meds, Adderall is not recommended for her due to cardiac health. Pt's attention has decr'd since CVA but pt's attention med was reduced to half dose than prior to CVA. SLP took time to explain/educate pt and husband why an attention med would cont to be helpful for pt from a therapeutic perspective. SLP ascertained that pt's anomia has improved but not dramatically since her hospital d/c for CVA. SLP to cont with pt for anomia/verbal expression and for problem solving in a functional context. Pt and husband agree with this plan.   02/02/24: SLP worked with pt today with word finding using divergent naming tasks with salient categories for pt. SLP req'd to provide mod cues occasionally for pt's success. SLP wonders if pt's premorbid memory deficit/MCI is impeding pt progress with anomia. SLP instructed pt to cont SFA and naming tasks at home. Pt completing word/language tasks with aphasia book at home, regularly.  01/23/24: Bunnie required incr'd cue level from [place] as steps increased for her homework. Today SLP with long discussion what pt could complete with husband at home to work with china attention and organization. Suggested cooking, baking, or organizing pantry or junk drawer in the kitchen, with steps written out in an organizational manner prior to beginning her work.  SLP also discussed with pt and [name] about pt keeping her own calendar at this time, with [name] assisting PRN. SLP expained that over time pt would do this independently but at this time she will need George's assistance and that this was a stepping stone to her eventually keeping her own calendar independently like she did prior to CVA.   01/08/24: Pt with 3 hour visit this morning from sister, and brother (from [facility]). Today SLP helped pt with occasional mod A with problem solving/sequencing task. She  req'd extra time and redirection at times with 13-step sequencing task. Took pt 17 minutes for this task. Level of fatigue at which pt entered may have played a role.  [name] had question about 6-step sequencing tasks- he could not find any online. SLP suggested he and pt think through some everyday  tasks and write down 6 steps in them and SLP provided 7-8 examples.  01/03/24: Pt brought homework in completed. She enjoyed sequencing task. With simple problem solving tasks pt req'd rare min A, but performance was functional.  Her speech today was WNL rate, without anomia. When she has anomia, she uses synonym or description strategy. SLP observed pt to use description strategy in previous sessions. Homework provided.  01/01/24: Pt did not complete any SFA since last session. SLP told pt to complete when she has anomia with a noun. Pt brought homework with her completed. She finished 40 wrtiten definitions in approx 90 minutes. Today SLP targeted simple problem solving with 5-step sequencing task. Pt took almost 10 minutes to complete 4 sequences. She was provided with homework until next session.  12/28/23: Pt brought in books that daughter bought her. SLP to look at these and star pages SLP thinks would be helpful for pt. SLP worked with pt today with SFA - grill. She req'd mod A occasionally for procedure and for more accurate wording, especially description. SLP provided homework for description.  12/25/23:  Minimal difficulty with letter fill in homework. Focusing on problem solving in  cognitive linguistics today, SLP assisted pt with scrambled sentences.Four to 5-word sentences were completed with 4/4 success. When pt worked on 7-9 word sentences with SLP she had intermittent difficulty with mental flexibility with word choice. SLP provided mod cues faded to min nonverbal cues. Pt's attention/memory deficit made alternating attention difficult at times so SLP suggested pt write the word order above the  words and this helped pt compensate for decr'd alternating attention.  SLP provided compensations for anomia for pt and educated her and [name] about these.   12/20/23: PROM provided today, with score above. [name] cueing pt about the accuracy of her answers. Needs anomia compensations next session. SLP asked [name] to fill out a CES as well for SLP to compare. Pt completed 3 clues task with 90% success, however only named average 2 items in a common category for divergent naming prior to requiring consistent mod-max  A. SLP provided letter fill ins for homework, and divergent naming for flowers in her yard, and for sports.  12/14/23: PT NEEDS PROM NEXT SESSION. SLP completed CLQT today. From pt's CLQT score it would indicate that although pt's Composite Score is WNL she had difficulty with processing speed (mazes, design generation), problem solving Forensic psychologist), memory Training and development officer memory), and language (generative naming). Goals added.   12/07/23: SLP educated pt and husband [name]) with describing phonemic cues, as this appeared to help pt during BNT. SLP provided examples during BNT and told [name] to assist pt in this way if he is sure of the word pt means to say. Secondly, SLP educated pt and husband on how to complete semantic feature analysis (SFA) at home with words/nouns pt experiences anomia with. SLP explained benefits of SFA to pt and husband. [name] took notes during session. SLP provided handouts for this framework.  PATIENT EDUCATION: Education details: see treatment date Person educated: Patient and Spouse Education method: Medical illustrator Education comprehension: verbalized understanding and needs further education   GOALS: Goals reviewed with patient? Yes in general  SHORT TERM GOALS: Target date: 01/05/24  Pt will complete cognitive linguistic assessment in first 2 sessions  Baseline: Goal status: met  2.  Pt will successfully use anomia compensations  with min A to do so, for 10 minutes functional simple conversation in 2 sessions Baseline:  Goal status: partially  met  3.  Pt will demo how to use SFA with rare min A in 3 sessions Baseline: 01/03/24 Goal status: partially met  4.  Pt will demo [facility] problem solving skills in simple cognitive linguistic tasks in 2 sessions Baseline: 01/03/24 Goal status: partially met   LONG TERM GOALS: Target date:  03/20/24  Pt will improve PROM compared to initial administration Baseline:  Goal status: INITIAL  2.  Pt will participate in 10 minutes mod complex conversation with functional expressive language (using anomia compensations) in 3 sessions Baseline:  Goal status: INITIAL  3.  Pt will demo functional problem solving skills in practical cognitive linguistic tasks in 3 sessions Baseline:  Goal status: deferred, per pt   ASSESSMENT:  CLINICAL IMPRESSION: Patient is a 73 y.o. F who was seen today for treatment of aphasia and problem solving in light of bilateral frontal CVA, and parietal CVA. See treatment date above for today's date for further details on today's session. OT has d/c'd at this time, and pt states she is satisfied with cognition currently, so ST to FOCUS ON ANOMIA/WORD FINDING. Problem solving goals were d/c'd in light of this. Pt with premorbid dx of ADHD and MCI.   OBJECTIVE IMPAIRMENTS: include attention, memory, awareness, and aphasia. These impairments are limiting patient from managing medications, managing appointments, managing finances, household responsibilities, ADLs/IADLs, and effectively communicating at home and in community. Factors affecting potential to achieve goals and functional outcome are ability to learn/carryover information and previous level of function. Patient will benefit from skilled SLP services to address above impairments and improve overall function.  REHAB POTENTIAL: Good  PLAN:  SLP FREQUENCY: 2x/week  SLP DURATION: 17 sessions  total  PLANNED INTERVENTIONS: Language facilitation, Environmental controls, Cueing hierachy, Cognitive reorganization, Internal/external aids, Functional tasks, Multimodal communication approach, SLP instruction and feedback, Compensatory strategies, Patient/family education, [phone] Treatment of speech (30 or 45 min) , and [id number] (Standardized cognitive assessment)    Varnell Orvis, CCC-SLP 02/28/2024, 2:26 PM

## 2024-02-28 NOTE — Therapy (Signed)
 OUTPATIENT OCCUPATIONAL THERAPY NEURO Treatment  [name] MRN: [id number] DOB:[birth year], 73 y.o., female Today's Date: 02/28/2024  PCP: Merna Huxley, NP REFERRING PROVIDER: Carilyn Prentice BRAVO, MD  END OF SESSION:  OT End of Session - 02/28/24 1504     Visit Number 16    Number of Visits 16    Date for OT Re-Evaluation 03/04/24    Authorization Type [employer]    OT Start Time 1320    OT Stop Time 1402    OT Time Calculation (min) 42 min    Activity Tolerance Patient tolerated treatment well    Behavior During Therapy WFL for tasks assessed/performed                        Past Medical History:  Diagnosis Date   Acute combined systolic and diastolic heart failure 10/11/2023   ADHD (attention deficit hyperactivity disorder) 05/09/2007   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; multiple punctate foci of restricted diffusion in bilateral frontal lobes and the left parietal lobe   Diverticulitis of colon (without mention of hemorrhage) 09/17/2013   Essential hypertension 10/09/2017   HFrEF (heart failure with reduced ejection fraction) 10/10/2023   History of COVID-19 2021   Hyperlipidemia    Hypothyroidism    Low back pain radiating to right leg 10/09/2017   Mild cognitive impairment of uncertain or unknown etiology 08/04/2022   Mild intermittent asthma with acute exacerbation 05/30/2017   Mild neurocognitive disorder due to multiple etiologies 12/19/2023   NSVT (nonsustained ventricular tachycardia) 10/12/2023   Osteoarthritis    Postmenopausal atrophic vaginitis, severe 08/18/2008   Right knee pain 03/13/2014   S/P arthroscopy of right knee 06/03/2014   Vitamin B12 deficiency    Past Surgical History:  Procedure Laterality Date   ABDOMINAL HYSTERECTOMY     BREAST SURGERY     CARPAL TUNNEL RELEASE Bilateral    CERVICAL DISC ARTHROPLASTY     CESAREAN SECTION     x2   left shoulder surgery      LOOP RECORDER INSERTION N/A 10/12/2023   Procedure: LOOP RECORDER INSERTION;  Surgeon: Cindie Ole DASEN, MD;  Location: [facility];  Service: Cardiovascular;  Laterality: N/A;   MENISCUS REPAIR Right 2017   REDUCTION MAMMAPLASTY Bilateral    right shoulder     TONSILLECTOMY     Patient Active Problem List   Diagnosis Date Noted   Mild neurocognitive disorder due to multiple etiologies 12/19/2023   NSVT (nonsustained ventricular tachycardia) 10/12/2023   Acute combined systolic and diastolic heart failure 10/11/2023   HFrEF (heart failure with reduced ejection fraction) 10/10/2023   CVA (cerebral vascular accident) 10/08/2023   Mild cognitive impairment of uncertain or unknown etiology 08/04/2022   Vitamin B12 deficiency    Osteoarthritis 08/03/2022   Hyperlipidemia 08/03/2022   Essential hypertension 10/09/2017   Low back pain radiating to right leg 10/09/2017   Mild intermittent asthma with acute exacerbation 05/30/2017   S/P arthroscopy of right knee 06/03/2014   Right knee pain 03/13/2014   Diverticulitis of colon (without mention of hemorrhage) 09/17/2013   Postmenopausal atrophic vaginitis, severe 08/18/2008   Hypothyroidism 05/09/2007   ADHD (attention deficit hyperactivity disorder) 05/09/2007    ONSET DATE: 10/08/23  REFERRING DIAG: I63.9 (ICD-10-CM) - Cerebral infarction, unspecified  THERAPY DIAG:  Attention and concentration deficit  Other symptoms and signs involving cognitive functions following cerebral infarction  Rationale for Evaluation and Treatment: Rehabilitation  SUBJECTIVE:  SUBJECTIVE STATEMENT: Pt reports that she has been trying to decide if she is going to continue with her Bible study (BSF) this year.  She has come to the conclusion that it may be too challenging this year.    Pt accompanied by: self and spouse  PERTINENT HISTORY: ADHD, HTN, diverticulitis, HLD, hypothyroidism, low back pain, neck fusion, mild cognitive impairment,  OA in R knee, asthma   presented to [facility] ED on 10/08/2023 with sudden onset of left facial droop, left-sided weakness,and L visual cut. MRI: Multiple punctate acute infarcts bilateral frontal and left parietal lobes. EEG suggestive of moderate diffuse encephalopathy.   PRECAUTIONS: Fall  WEIGHT BEARING RESTRICTIONS: No  PAIN:  Are you having pain? No pain  FALLS: Has patient fallen in last 6 months? No  LIVING ENVIRONMENT: Lives with: lives with their spouse Lives in: House/apartment Stairs: Yes: Internal: bedroom/bathroom on main floor, does have a full flight of steps - spouse's office is upstairs  steps; one rail and External: 2 steps;   Has following equipment at home: shower chair and hand held shower head  PLOF: Independent, Independent with basic ADLs, and Leisure: walking up to 4-6 hours per day  PATIENT GOALS: I really don't know  OBJECTIVE:  Note: Objective measures were completed at Evaluation unless otherwise noted.  HAND DOMINANCE: Right  ADLs: Overall ADLs: Mod I with bathing and dressing, pt's spouse will go in and out of bathroom while pt in shower providing distant supervision to Mod I level Transfers/ambulation related to ADLs: Mod I without AD, getting into high bed without any issue  IADLs: Shopping: pt gets overwhelmed with everything that is going on, easily distracted  Meal Prep: spouse is doing most of the cooking right now as pt forgetful with sequencing and ingredients, and easily distracted Community mobility: not cleared to drive at this time Medication management: spouse is assisting now, he will fill up a daily pill box for pt   MOBILITY STATUS: Mod I without AD  POSTURE COMMENTS:  No Significant postural limitations  ACTIVITY TOLERANCE: Activity tolerance: pt reports worn out after 30 min walk (where as they would walk up to 2 hrs at a time before stroke).  Pt is not sleeping as much during the day time.  FUNCTIONAL OUTCOME MEASURES: 9  hole peg test: Right: 36.54 sec; Left: 34.06 sec Trail Making: 42.47 sec for Trail A   UPPER EXTREMITY ROM:  WFL bilaterally  UPPER EXTREMITY MMT:     MMT Right eval Left eval  Shoulder flexion 4+ 4-  Shoulder abduction    Shoulder adduction    Shoulder extension    Shoulder internal rotation    Shoulder external rotation    Middle trapezius    Lower trapezius    Elbow flexion 4 4  Elbow extension 4- 4-  Wrist flexion    Wrist extension    Wrist ulnar deviation    Wrist radial deviation    Wrist pronation    Wrist supination    (Blank rows = not tested)  COORDINATION: 9 Hole Peg test: Right: 36.54 sec; Left: 34.06 sec Box and Blocks:  Right 43 blocks, Left 36blocks - Pt requiring cues x3 to remember to only pick up one block at a time when completing with Right; requiring cues to go over barrier instead of around (possibly slowing down pace even more).  SENSATION: WFL  COGNITION: Overall cognitive status: Impaired and History of cognitive impairments - at baseline Pt able to recall 3 words:  sock blue bed after 5 mins  Pt with difficulty with word finding and memory  VISION: Subjective report: wears glasses, reported f/u with neurology and ophthalmologist who report to wait for further assessment to allow recovery s/p CVA Baseline vision: Wears glasses all the time  VISION ASSESSMENT: To be further assessed in functional context Eye alignment: WFL Ocular ROM: WFL Gaze preference/alignment: WDL Tracking/Visual pursuits: TBA Visual Fields: TBA in functional context   Bell cancellation test: locating 32/35 in 3:00, pt then able to locate remaining 2/3 in 4:20 before stating I think that is all  PERCEPTION: Impaired  PRAXIS: Impaired: Initiation, Motor planning, and Organization  OBSERVATIONS: Difficult to assess vision, attention, and strength as pt requiring increased cueing and repeat directions.  Unsure full attention and effort during MMT this session.   Pt forgetting instructions during coordination assessment, requiring wait time for recall and/or additional cues.                                                                                                                             TREATMENT DATE:  02/28/24 Engaged in discussion about continued engagement in some sort of Bible study to allow for attention, processing, and recall.  OT encouraged a less demanding option and/or use of an app.  Pt stating that the leader has offered to support as needed, therefore OT recommended that she f/u with leader on a consistent basis to still allow some involvement. Reiterated education on reducing distractions to increase attention, focus, and recall.  Discussed appropriate times to challenge attention with distractions while also being more aware of when to reduce distractions as able.   Engaged in [place] cancellation test.  Pt locating 31 of 35 in 2:37. Cued that she needed to locate additional 4, with pt able to complete in 3:40 total.  Pt demonstrating improved sequencing and scanning during task. Sequencing: engaged in simple problem solving when given 3 errands and time constraints.  Pt correctly able to identify which errands to run first based on time of day and importance as well as amount of time a particular errand may require.   Awareness: pt reporting increased insight and awareness into routine tasks.  Pt stating that she had the foresight to call about upcoming mammogram to ensure safety due to loop recorder placement.  Pt stating that she called the physician's office without prompting from spouse.    02/26/24 Pill box assessment: Pt completed pill box test with 2 errors, in 11:18 minutes.  Pt placing an additional pill in 2 slots as she had forgotten when she left off when she went back to get more of same medication.  Pt demonstrating good sequencing and process of checks and balances by setting completed pill bottles to R and flipping  upside down.  Pt did take a prolonged pause during 3 times per day medication, looking to therapist for clarification but talking it out on her own.    Functional cognition further  assessed with The Pillbox Test: A Measure of Executive Functioning and Estimate of Medication Management. A straight pass/fail designation is determined by 3 or more errors of omission or misplacement on the task. The pt completed the test with 0 errors. Trail making: completed Trail B: 2:54 with initial significant improvement in sequencing and alternating attention through 8 - H but then questioning self through remainder of numbers/letters.  OT educating on functional carryover of task with cooking, particularly when cooking 2 or 3 items at the same time.  OT encouraging pt to attempt cooking 2 items for meal simultaneously.      02/21/24 Jigsaw puzzle: pt completed 10 piece puzzle with use of picture for sequencing and organization.  Pt requiring mild increased time due to odd shape of puzzle. Pt with difficulty naming aquatic animals, even after completing puzzle with aquatic animals on it - question if more anomia vs memory. Recall and sequencing: engaged in 2-4 step command tasks with pt demonstrating improved recall of steps when repeated by therapist x2 and then by pt aloud.  Pt with increased recall of multi- step commands with details of specific numbers.  Pt also demonstrating good recall of motor sequencing tasks.  Pt stopping intermittently during tasks, looking to therapist for input, but able to complete without any additional cues during tasks this session.  Reviewed functional sequencing tasks such as emptying dishwasher, washing clothes, etc with pt reporting good sequencing of each at home. Recall: engaged in scavenger hunt to challenge memory with pt able to recall 4 of 5 items after initial repetition (as above).      02/19/24 Sequencing: Pt able to demonstrate appropriate sequencing for texting and  making a phone call.  Pt still with anomia impacting ability to verbalize sequencing but able to demonstrate. Phone use: engaged in discussion about removing excess apps (to a different page vs deleting) and removing alerts for social media to allow for increased ability to manage most used functions on phone.  Pt's spouse rearranging apps during session with plan to assess if improved ease and attention to task with decreased visual distractions. Motor/cognitive dual tasking: engaged in use of sequencing cards with pt completing 3 step motor commands with good sequencing and recall.  OT then providing pt with additional 3 step sequencing task to challenge recall with pt requiring increased time but able to recall all 3 tasks correctly.     PATIENT EDUCATION: Education details: see today's tx above Person educated: Patient and Spouse Education method: Explanation and Handouts Education comprehension: verbalized understanding and needs further education  HOME EXERCISE PROGRAM: Provided written suggestions for keeping thinking skills sharp - plan to provide printout at future session 12/13/23 - memory compensation strategies and activities to keep thinking skills sharp (see pt instructions)   GOALS: Goals reviewed with patient? Yes  SHORT TERM GOALS: Target date: 12/15/23  Pt will be independent with coordination HEP with visual handouts for recall. Baseline: new to OPOT Goal status: in progress  2.  Pt will demonstrate understanding of memory compensations and ways to keep thinking skills sharp Baseline: STM deficits and impaired initiation 01/08/24 - pt utilizing writing information down,  WARM strategy, and engaging in logic puzzles with assistance from spouse for attention, sequencing, and deductive reasoning.   Goal status: MET  3.  Pt will demonstrate and/or verbalize understanding of visual attention exercises and compensatory strategies for improved visual scanning and  attention. Baseline: left field cut 01/08/24 - pt and spouse report that this does  not seem to be an area of concern any longer Goal status: MET, however would benefit from f/u  4.  Pt will demonstrate improved coordination, attention to task, and recall of instructions by completing box and blocks assessment with improved score by 6 blocks bilaterally. Baseline: Right 43 blocks, Left 36blocks - Pt requiring cues x3 to remember to only pick up one block at a time when completing with Right; requiring cues to go over barrier instead of around (possibly slowing down pace even more). 01/08/24: Right 41 blocks, Left 42 blocks - Pt demonstrating improved recall of instructions with only initial instruction Goal status: partially met: not met on RUE, met with LUE    LONG TERM GOALS: Target date: 03/04/24  Pt will verbalize understanding of task modifications, adaptive strategies, and/or potential AE needs to increase ease, safety, and independence w/ IADLs. Baseline: decreased engagement in IADLs due to impaired memory and attention 02/28/24: pt reports increased engagement in routine IADLs without issue Goal status: MET  2.  Pt will complete simulated medication management activity w/ Supervision by discharge, incorporating compensatory strategies/AE prn Baseline: spouse managing meds at this time, was not prior to CVA 02/26/24: pt completing with good sequencing, use of checks and balances, however requires significant increased time therefore continue to recommend meds be completed with supervision/with spouse Goal status: MET  3.  Pt will demonstrate ability to sequence simple functional task (simple snack prep, laundry task, etc) at Mod I level with good safety awareness and ability to utilize memory strategies for recall. Baseline:  02/28/24: pt reports completing laundry without assistance, making phone call about upcoming mammogram appt Goal status: MET  4.  Pt will navigate a moderately  busy environment, completing dual task activity and/or following multi-step commands with 90% accuracy Baseline:  Goal status: in progress  5.  Pt will demonstrate improved initiation and sequencing to be able to navigate use of personal cell phone and send text messages in <15 mins. Baseline: spouse reporting 45 mins to reply to test message 02/28/24: phone has been organized to decrease cluttered appearance with apps and reducing alerts.  Pt with improved sequencing with making phone calls and sending texts.  Pt still with some word finding difficulties which slow her down sometimes. Goal status: MET   ASSESSMENT:  CLINICAL IMPRESSION: Pt is demonstrating improvements in attention, recall, and sequencing with use of memory strategies and systematic completion of activities.  Pt does continue to benefit from increased time for sequencing and processing of information, largely due to difficulties with word finding aspect of tasks. Pt and spouse receptive to education on continued dual tasking with meal prep and increased involvement in routine tasks to continue to focus on cognitive/motor dual tasking.  Reiterated games and apps to address memory, visual scanning, sequencing.  Plan to defer therapy for ~30 days to determine continued necessity of skilled OT services. Pt encouraged to call back before that time with any changes or if any relevant functional deficits develop/occur. Pt to be discharged from OP OT if no further therapy is warranted after that time.   PERFORMANCE DEFICITS: in functional skills including ADLs, IADLs, coordination, strength, Fine motor control, Gross motor control, endurance, decreased knowledge of precautions, vision, and UE functional use, cognitive skills including attention, energy/drive, memory, problem solving, safety awareness, and sequencing, and psychosocial skills including environmental adaptation and routines and behaviors.     PLAN:  OT FREQUENCY:  1-2x/week  OT DURATION: 6 weeks (asking 8 weeks for scheduling)  PLANNED  INTERVENTIONS: 97168 OT Re-evaluation, 97535 self care/ADL training, 02889 therapeutic exercise, 97530 therapeutic activity, 97112 neuromuscular re-education, functional mobility training, visual/perceptual remediation/compensation, psychosocial skills training, energy conservation, coping strategies training, patient/family education, and DME and/or AE instructions  RECOMMENDED OTHER SERVICES: SLP  CONSULTED AND AGREED WITH PLAN OF CARE: Patient and family member/caregiver  PLAN FOR NEXT SESSION:   Cognitive/motor dual tasking, sequencing, recall of 2-3 step directions  Phone use  Further assess during functional tasks    KAYLENE DOMINO, OTR/L 02/28/2024, 3:05 PM  [facility] at [facility] [address], [address] [place], [place] [address] Phone # [phone] Fax # [phone]

## 2024-02-28 NOTE — Progress Notes (Signed)
 Pharmacy Quality Measure Review  This patient is appearing on a report for being at risk of failing the adherence measure for diabetes medications this calendar year.   Medication: metformin  - discontinued at prior hospitalization. A1c well controlled at last check in May.   Insurance report was not up to date. No action needed at this time.   Catie IVAR Centers, PharmD, [facility] Clinical Pharmacist [phone]

## 2024-03-01 ENCOUNTER — Encounter

## 2024-03-05 ENCOUNTER — Ambulatory Visit (INDEPENDENT_AMBULATORY_CARE_PROVIDER_SITE_OTHER): Admitting: Adult Health

## 2024-03-05 ENCOUNTER — Ambulatory Visit

## 2024-03-05 ENCOUNTER — Encounter: Payer: Self-pay | Admitting: Adult Health

## 2024-03-05 VITALS — BP 110/62 | HR 51 | Temp 97.8°F | Wt 111.0 lb

## 2024-03-05 DIAGNOSIS — R7303 Prediabetes: Secondary | ICD-10-CM | POA: Diagnosis not present

## 2024-03-05 DIAGNOSIS — F909 Attention-deficit hyperactivity disorder, unspecified type: Secondary | ICD-10-CM | POA: Diagnosis not present

## 2024-03-05 DIAGNOSIS — I10 Essential (primary) hypertension: Secondary | ICD-10-CM

## 2024-03-05 DIAGNOSIS — R41840 Attention and concentration deficit: Secondary | ICD-10-CM | POA: Diagnosis not present

## 2024-03-05 DIAGNOSIS — R41841 Cognitive communication deficit: Secondary | ICD-10-CM

## 2024-03-05 DIAGNOSIS — R4701 Aphasia: Secondary | ICD-10-CM

## 2024-03-05 LAB — POCT GLYCOSYLATED HEMOGLOBIN (HGB A1C): Hemoglobin A1C: 6 % — AB (ref 4.0–5.6)

## 2024-03-05 NOTE — Progress Notes (Signed)
 Subjective:    Patient ID: Gabrielle Vargas, female    DOB: Jan 13, 1951, 73 y.o.   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), ADHD (attention deficit hyperactivity disorder) (05/09/2007), CVA (cerebral vascular accident) (10/08/2023), Diverticulitis of colon (without mention of hemorrhage) (09/17/2013), Essential hypertension (10/09/2017), HFrEF (heart failure with reduced ejection fraction) (10/10/2023), History of COVID-19 (2021), Hyperlipidemia, Hypothyroidism, Low back pain radiating to right leg (10/09/2017), Mild cognitive impairment of uncertain or unknown etiology (08/04/2022), Mild intermittent asthma with acute exacerbation (05/30/2017), Mild neurocognitive disorder due to multiple etiologies (12/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  She presents to the office today for follow up regarding prediabetes,HTN, and ADHD  Prediabetes  - not currently on medication. She was on metformin  until her recent hospital admission for CVA at which time Metformin  was d/c.  Lab Results  Component Value Date   HGBA1C 6.0 (A) 12/05/2023   HGBA1C 6.2 09/05/2023   HGBA1C 5.8 (A) 03/07/2023   Wt Readings from Last 3 Encounters:  03/05/24 111 lb (50.3 kg)  02/13/24 109 lb (49.4 kg)  01/31/24 109 lb 12.8 oz (49.8 kg)   Hypertension - managed with Toprol  XL 25 mg daily and losartan  12.5 mg daily. She has been monitoring her BP at home with readings in the 102-120 range systolic. She denies dizziness, lightheadedness or blurred vision.  BP Readings from Last 3 Encounters:  03/05/24 110/62  01/31/24 118/64  01/31/24 120/60   ADHD - about three weeks ago she was switched from Adderall to Wellbutrin    I  had received a message from her cardiology team for concern of patient being on Adderall.  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  Cardiology wanted me to talk with the patient since I am the prescriber of her Adderall about changing her to a non stimulant form. Today she reports hat she has not had any side effects of Wellbutrin  and she does not feel any different from switching from Adderral to Wellbutrin . She does not feel like she is having any ADHD symptoms   Review of Systems See HPI   Past Medical History:  Diagnosis Date   Acute combined systolic and diastolic heart failure 10/11/2023   ADHD (attention deficit hyperactivity disorder) 05/09/2007   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; multiple punctate foci of restricted diffusion in bilateral frontal lobes and the left parietal lobe   Diverticulitis of colon (without mention of hemorrhage) 09/17/2013   Essential hypertension 10/09/2017   HFrEF (heart failure with reduced ejection fraction) 10/10/2023   History of COVID-19 2021   Hyperlipidemia    Hypothyroidism    Low back pain radiating to right leg 10/09/2017   Mild cognitive impairment of uncertain or unknown etiology 08/04/2022   Mild intermittent asthma with acute exacerbation 05/30/2017   Mild neurocognitive disorder due to multiple etiologies 12/19/2023   NSVT (nonsustained ventricular tachycardia) 10/12/2023   Osteoarthritis    Postmenopausal atrophic vaginitis, severe 08/18/2008   Right knee pain 03/13/2014   S/P arthroscopy of right knee 06/03/2014  Vitamin B12 deficiency     Social History   Socioeconomic History   Marital status: Married    Spouse name: Not on file   Number of children: 2    Years of education: 13   Highest education level: Some college, no degree  Occupational History   Occupation: Retired  Tobacco Use   Smoking status: Never   Smokeless tobacco: Never  Vaping Use   Vaping status: Never Used  Substance and Sexual Activity   Alcohol use: No   Drug use: No   Sexual activity: Not on file  Other Topics Concern   Not on file  Social History Narrative   Retired   Married   2 children   Right handed   Live with husband   Social Drivers of Corporate investment banker Strain: Low Risk  (12/23/2022)   Overall Financial Resource Strain (CARDIA)    Difficulty of Paying Living Expenses: Not hard at all  Food Insecurity: Patient Unable To Answer (10/09/2023)   Hunger Vital Sign    Worried About Running Out of Food in the Last Year: Patient unable to answer    Ran Out of Food in the Last Year: Patient unable to answer  Transportation Needs: Patient Unable To Answer (10/09/2023)   PRAPARE - Transportation    Lack of Transportation (Medical): Patient unable to answer    Lack of Transportation (Non-Medical): Patient unable to answer  Physical Activity: Sufficiently Active (12/23/2022)   Exercise Vital Sign    Days of Exercise per Week: 7 days    Minutes of Exercise per Session: 60 min  Stress: No Stress Concern Present (12/23/2022)   [employer] of Occupational Health - Occupational Stress Questionnaire    Feeling of Stress : Not at all  Social Connections: Patient Unable To Answer (10/09/2023)   Social Connection and Isolation Panel    Frequency of Communication with Friends and Family: Patient unable to answer    Frequency of Social Gatherings with Friends and Family: Patient unable to answer    Attends Religious Services: Patient unable to answer    Active Member of Clubs or Organizations: Patient unable to answer    Attends Banker Meetings: Patient unable to answer    Marital Status: Patient unable to answer  Intimate Partner Violence:  Patient Unable To Answer (10/09/2023)   Humiliation, Afraid, Rape, and Kick questionnaire    Fear of Current or Ex-Partner: Patient unable to answer    Emotionally Abused: Patient unable to answer    Physically Abused: Patient unable to answer    Sexually Abused: Patient unable to answer    Past Surgical History:  Procedure Laterality Date   ABDOMINAL HYSTERECTOMY     BREAST SURGERY     CARPAL TUNNEL RELEASE Bilateral    CERVICAL DISC ARTHROPLASTY     CESAREAN SECTION     x2   left shoulder surgery     LOOP RECORDER INSERTION N/A 10/12/2023   Procedure: LOOP RECORDER INSERTION;  Surgeon: Cindie Ole DASEN, MD;  Location: [facility];  Service: Cardiovascular;  Laterality: N/A;   MENISCUS REPAIR Right 2017   REDUCTION MAMMAPLASTY Bilateral    right shoulder     TONSILLECTOMY      Family History  Problem Relation Age of Onset   Melanoma Mother    Kidney disease Father    Diabetes Father    Heart disease Father    Breast cancer Paternal Grandmother    ADD / ADHD  Other        multiple family members   Colon cancer Neg Hx    Esophageal cancer Neg Hx    Rectal cancer Neg Hx    Stomach cancer Neg Hx     Allergies  Allergen Reactions   Ativan  [Lorazepam ]     Agitation/paradoxical reaction.   Oxycodone-Acetaminophen  Nausea Only   Latex Hives and Rash    Current Outpatient Medications on File Prior to Visit  Medication Sig Dispense Refill   Acetylcysteine (NAC) 500 MG CAPS Take 500 mg by mouth daily.     Ascorbic Acid (VITAMIN C) 1000 MG tablet Take 1,000 mg by mouth daily.     aspirin  EC 81 MG tablet Take 81 mg by mouth daily. Swallow whole.     atorvastatin  (LIPITOR) 40 MG tablet Take 0.5 tablets (20 mg total) by mouth daily. 90 tablet 3   Bioflavonoid Products (QUERCETIN COMPLEX IMMUNE PO) Take 500 mg by mouth daily.     buPROPion  (WELLBUTRIN  XL) 150 MG 24 hr tablet Take 1 tablet (150 mg total) by mouth daily. 30 tablet 0   Cholecalciferol (D3-50 PO) Take 1  capsule by mouth daily.     Evening Primrose Oil 1000 MG CAPS Take 1,000 mg by mouth in the morning and at bedtime.     ezetimibe  (ZETIA ) 10 MG tablet Take 1 tablet (10 mg total) by mouth at bedtime. 90 tablet 3   ipratropium (ATROVENT ) 0.06 % nasal spray Place 2 sprays into both nostrils 2 (two) times daily as needed. 15 mL 12   metoprolol  succinate (TOPROL  XL) 25 MG 24 hr tablet Take 0.5 tablets (12.5 mg total) by mouth daily. 45 tablet 3   SYNTHROID  75 MCG tablet Take 1 tablet (75 mcg total) by mouth daily. 90 tablet 3   vitamin B-12 (CYANOCOBALAMIN ) 1000 MCG tablet Take 1,000 mcg by mouth 2 (two) times a week.     Zinc 20 MG CAPS Take 20 mg by mouth daily.     No current facility-administered medications on file prior to visit.    BP 110/62   Pulse (!) 51   Temp 97.8 F (36.6 C) (Oral)   Wt 111 lb (50.3 kg)   SpO2 94%   BMI 22.42 kg/m       Objective:   Physical Exam Vitals and nursing note reviewed.  Constitutional:      Appearance: Normal appearance.  Cardiovascular:     Rate and Rhythm: Normal rate and regular rhythm.     Pulses: Normal pulses.     Heart sounds: Normal heart sounds.  Pulmonary:     Effort: Pulmonary effort is normal.     Breath sounds: Normal breath sounds.  Musculoskeletal:        General: Normal range of motion.  Skin:    General: Skin is warm and dry.     Capillary Refill: Capillary refill takes less than 2 seconds.  Neurological:     General: No focal deficit present.     Mental Status: She is alert and oriented to person, place, and time.  Psychiatric:        Mood and Affect: Mood normal.        Behavior: Behavior normal.        Thought Content: Thought content normal.        Judgment: Judgment normal.        Assessment & Plan:  1. Prediabetes (Primary)  - POC HgB A1c- 6.0 - no change - Follow up  in six months at [facility]  - Continue to stay active and eat healthy   2. Primary hypertension - At goal. No change in medication   3.  Attention deficit hyperactivity disorder (ADHD), unspecified ADHD type - We discussed trying to come off Wellbutrin  to see if she actually needs to be on medication for ADHD. She would like to do this. Since she has only been on this medication for a couple of weeks I think she will do well with stopping the medication for a week to see how she does. She knows she can restart it if needed  Darleene Shape, NP

## 2024-03-05 NOTE — Therapy (Unsigned)
 OUTPATIENT SPEECH LANGUAGE PATHOLOGY TREATMENT   [name] MRN: [id number] DOB:January 22, 1951, 73 y.o., female Today's Date: 03/06/2024  PCP: Merna Huxley, NP REFERRING PROVIDER: Carilyn Prentice BRAVO, MD  END OF SESSION:  End of Session - 03/06/24 0830     Visit Number 15    Number of Visits 17    Date for SLP Re-Evaluation 03/20/24    SLP Start Time 1319    SLP Stop Time  1400    SLP Time Calculation (min) 41 min    Activity Tolerance Patient tolerated treatment well                  Past Medical History:  Diagnosis Date   Acute combined systolic and diastolic heart failure 10/11/2023   ADHD (attention deficit hyperactivity disorder) 05/09/2007   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; multiple punctate foci of restricted diffusion in bilateral frontal lobes and the left parietal lobe   Diverticulitis of colon (without mention of hemorrhage) 09/17/2013   Essential hypertension 10/09/2017   HFrEF (heart failure with reduced ejection fraction) 10/10/2023   History of COVID-19 2021   Hyperlipidemia    Hypothyroidism    Low back pain radiating to right leg 10/09/2017   Mild cognitive impairment of uncertain or unknown etiology 08/04/2022   Mild intermittent asthma with acute exacerbation 05/30/2017   Mild neurocognitive disorder due to multiple etiologies 12/19/2023   NSVT (nonsustained ventricular tachycardia) 10/12/2023   Osteoarthritis    Postmenopausal atrophic vaginitis, severe 08/18/2008   Right knee pain 03/13/2014   S/P arthroscopy of right knee 06/03/2014   Vitamin B12 deficiency    Past Surgical History:  Procedure Laterality Date   ABDOMINAL HYSTERECTOMY     BREAST SURGERY     CARPAL TUNNEL RELEASE Bilateral    CERVICAL DISC ARTHROPLASTY     CESAREAN SECTION     x2   left shoulder surgery     LOOP RECORDER INSERTION N/A 10/12/2023   Procedure: LOOP RECORDER INSERTION;  Surgeon: Cindie Ole DASEN, MD;   Location: [facility];  Service: Cardiovascular;  Laterality: N/A;   MENISCUS REPAIR Right 2017   REDUCTION MAMMAPLASTY Bilateral    right shoulder     TONSILLECTOMY     Patient Active Problem List   Diagnosis Date Noted   Mild neurocognitive disorder due to multiple etiologies 12/19/2023   NSVT (nonsustained ventricular tachycardia) 10/12/2023   Acute combined systolic and diastolic heart failure 10/11/2023   HFrEF (heart failure with reduced ejection fraction) 10/10/2023   CVA (cerebral vascular accident) 10/08/2023   Mild cognitive impairment of uncertain or unknown etiology 08/04/2022   Vitamin B12 deficiency    Osteoarthritis 08/03/2022   Hyperlipidemia 08/03/2022   Essential hypertension 10/09/2017   Low back pain radiating to right leg 10/09/2017   Mild intermittent asthma with acute exacerbation 05/30/2017   S/P arthroscopy of right knee 06/03/2014   Right knee pain 03/13/2014   Diverticulitis of colon (without mention of hemorrhage) 09/17/2013   Postmenopausal atrophic vaginitis, severe 08/18/2008   Hypothyroidism 05/09/2007   ADHD (attention deficit hyperactivity disorder) 05/09/2007     ONSET DATE:  10/08/23  REFERRING DIAG: P30.679 (ICD-10-CM) - Aphasia as late effect of stroke  THERAPY DIAG:  Cognitive communication deficit  Aphasia  Rationale for Evaluation and Treatment: Rehabilitation  SUBJECTIVE:   SUBJECTIVE STATEMENT: Pt returned homework independently.     Pt accompanied by: self   PERTINENT HISTORY: HPI: Pt presented to [facility] ED on  10/08/2023 with sudden onset of left facial droop and left-sided weakness. ADHD, HTN, diverticulitis, HLD, hypothyroidism, low back pain, neck fusion, mild cognitive impairment (dx Jan 2024), cervical disc arthroplasty, OA in R knee, asthma   PAIN:  Are you having pain? No  FALLS: Has patient fallen in last 6 months?  No  PATIENT GOALS: Improve language function, memory  OBJECTIVE:  Note: Objective measures  were completed at Evaluation unless otherwise noted.  DIAGNOSTIC FINDINGS:  MRI 10/09/23 IMPRESSION: 1. Findings compatible with multiple punctate acute infarcts in bilateral frontal and left parietal lobes. Given involvement of multiple vascular territories, consider an embolic etiology. 2. Significantly motion limited study.   Date of Discharge from SLP service:October 19, 2023 Clinical Impression/Discharge Summary: Pt has made great gains and has met 4 of 6 LTG's this admission due to improved cognition and language. Pt is currently an overall mod I A for orientation tasks, though requires up to [facility] for attention. Patient requires mod I A for expressive language, though continues to require occasional min-modA for complex receptive language tasks.  Pt/family education complete and pt will discharge home with 24 hour supervision from friends/family/etc. Pt would benefit from OP f/u ST services to maximize cognition and communication in order to maximize functional independence.     STANDARDIZED ASSESSMENTS:   Cognitive Linguistic Quick Test  AGE - 70-89  The Cognitive Linguistic Quick Test (CLQT) was administered to assess the relative status of five cognitive domains: attention, memory, language, executive functioning, and visuospatial skills. Scores from 10 tasks were used to estimate severity ratings (standardized for age groups 18-69 years and 70-89 years) for each domain, a clock drawing task, as well as an overall composite severity rating of cognition.     Task Score Criterion Cut Scores  Personal Facts 8/8 8  Symbol Cancellation 12/12 10  Confrontation Naming 10/10 10  Clock Drawing  12/13 11  Story Retelling 8/10 5  Symbol Trails 10/10 6  Generative Naming 3/9 4  Design Memory 4/6 4  Mazes  4/8 4  Design Generation 3/13 5   Cognitive Domain Composite Score Severity Rating  Attention 181/215 WNL  Memory 147/185 WNL  Executive Function 20/40 Low-WNL  Language 29/37 Low-WNL   Visuospatial Skills 75/105 WNL  Clock Drawing  12/13 WNL  Composite Severity Rating  WNL     PATIENT REPORTED OUTCOME MEASURES (PROM): Communication Effectiveness Survey: 12/20/23 - pt scored herself 18/32, with higher scores indicating less of a negative effect of pt's deficits on daily life and QOL.                                                                                                                              TREATMENT DATE:  Semantic Feature Analysis (SFA)  03/05/24: SLP reviewed pt's homework with her - req'd mod-max A consistently for words associated with a given concept. SLP modeled cueing methods for [name] in order for him to assist pt at home  with this task/these anomia tasks. SLP educated pt/husband that it appears her premorbid cognitive differences are making it more challenging for her to experience more success - SLP does not usually need to provide this level of cueing on these lower-level tasks for pts post CVA. Pt may not return to baseline with her deficits presenting the way they are at this point post CVA.  02/28/24: Pt reports no frustrations or problems as of now with cognition currently and she is satisfied to focus on anomia in ST beginning next session. SLP suggested pt keep track of her own homework in a manner she desires. SLP assisted pt with deciding she could do this with her clipboard, and provide homework to SLP at beginning of sessions. SLP reviewed pt's homework with her. Pt unaware of 2 details on detailed written instructions. SLP gave consistent mod A for [place] . Homework provided for word finding.  02/26/24:  SLP assisted pt with error awareness and reviewed homework with her - pt req'd min-mod A for awareness with this task (map). Her groceries task demonstrated min decr'd organization, req'd mod A for error awareness, and she req'd mod semantic cues during this task to generate dairy due to anomia. SLP encouraged pt and [name]  to cont SFA at home PRN. They will complete smoke alarm with SFA.   02/19/24: SLP assisted pt in problem solving with [employer] given the information she found. Initially she felt like she could keep up but SLP talked further with pt about prophecy books and she decided this was not best for her to participate. Pt had difficulty arriving at a way she could participate but not necessarily do al lthe work necessary and SLP helped pt realize other options (problem solve). Pt is  now on Wellbutrin , which she and [name] will assess if attention is improved or same with this med compared to Adderall.   02/15/24: Pt with discussion with PCP this afternoon about meds, Adderall is not recommended for her due to cardiac health. Pt's attention has decr'd since CVA but pt's attention med was reduced to half dose than prior to CVA. SLP took time to explain/educate pt and husband why an attention med would cont to be helpful for pt from a therapeutic perspective. SLP ascertained that pt's anomia has improved but not dramatically since her hospital d/c for CVA. SLP to cont with pt for anomia/verbal expression and for problem solving in a functional context. Pt and husband agree with this plan.   02/02/24: SLP worked with pt today with word finding using divergent naming tasks with salient categories for pt. SLP req'd to provide mod cues occasionally for pt's success. SLP wonders if pt's premorbid memory deficit/MCI is impeding pt progress with anomia. SLP instructed pt to cont SFA and naming tasks at home. Pt completing word/language tasks with aphasia book at home, regularly.  01/23/24: Bunnie required incr'd cue level from [place] as steps increased for her homework. Today SLP with long discussion what pt could complete with husband at home to work with china attention and organization. Suggested cooking, baking, or organizing pantry or junk drawer in the kitchen, with steps written out in an  organizational manner prior to beginning her work.  SLP also discussed with pt and [name] about pt keeping her own calendar at this time, with [name] assisting PRN. SLP expained that over time pt would do this independently but at this time she will need George's assistance and that this was a stepping  stone to her eventually keeping her own calendar independently like she did prior to CVA.   01/08/24: Pt with 3 hour visit this morning from sister, and brother (from [facility]). Today SLP helped pt with occasional mod A with problem solving/sequencing task. She req'd extra time and redirection at times with 13-step sequencing task. Took pt 17 minutes for this task. Level of fatigue at which pt entered may have played a role.  [name] had question about 6-step sequencing tasks- he could not find any online. SLP suggested he and pt think through some everyday tasks and write down 6 steps in them and SLP provided 7-8 examples.  01/03/24: Pt brought homework in completed. She enjoyed sequencing task. With simple problem solving tasks pt req'd rare min A, but performance was functional.  Her speech today was WNL rate, without anomia. When she has anomia, she uses synonym or description strategy. SLP observed pt to use description strategy in previous sessions. Homework provided.  01/01/24: Pt did not complete any SFA since last session. SLP told pt to complete when she has anomia with a noun. Pt brought homework with her completed. She finished 40 wrtiten definitions in approx 90 minutes. Today SLP targeted simple problem solving with 5-step sequencing task. Pt took almost 10 minutes to complete 4 sequences. She was provided with homework until next session.  12/28/23: Pt brought in books that daughter bought her. SLP to look at these and star pages SLP thinks would be helpful for pt. SLP worked with pt today with SFA - grill. She req'd mod A occasionally for procedure and for more accurate wording, especially description.  SLP provided homework for description.  12/25/23:  Minimal difficulty with letter fill in homework. Focusing on problem solving in  cognitive linguistics today, SLP assisted pt with scrambled sentences.Four to 5-word sentences were completed with 4/4 success. When pt worked on 7-9 word sentences with SLP she had intermittent difficulty with mental flexibility with word choice. SLP provided mod cues faded to min nonverbal cues. Pt's attention/memory deficit made alternating attention difficult at times so SLP suggested pt write the word order above the words and this helped pt compensate for decr'd alternating attention.  SLP provided compensations for anomia for pt and educated her and [name] about these.   12/20/23: PROM provided today, with score above. [name] cueing pt about the accuracy of her answers. Needs anomia compensations next session. SLP asked [name] to fill out a CES as well for SLP to compare. Pt completed 3 clues task with 90% success, however only named average 2 items in a common category for divergent naming prior to requiring consistent mod-max  A. SLP provided letter fill ins for homework, and divergent naming for flowers in her yard, and for sports.  12/14/23: PT NEEDS PROM NEXT SESSION. SLP completed CLQT today. From pt's CLQT score it would indicate that although pt's Composite Score is WNL she had difficulty with processing speed (mazes, design generation), problem solving Forensic psychologist), memory Training and development officer memory), and language (generative naming). Goals added.   12/07/23: SLP educated pt and husband [name]) with describing phonemic cues, as this appeared to help pt during BNT. SLP provided examples during BNT and told [name] to assist pt in this way if he is sure of the word pt means to say. Secondly, SLP educated pt and husband on how to complete semantic feature analysis (SFA) at home with words/nouns pt experiences anomia with. SLP explained benefits of SFA to pt and husband.  [name] took  notes during session. SLP provided handouts for this framework.  PATIENT EDUCATION: Education details: see treatment date Person educated: Patient and Spouse Education method: Medical illustrator Education comprehension: verbalized understanding and needs further education   GOALS: Goals reviewed with patient? Yes in general  SHORT TERM GOALS: Target date: 01/05/24  Pt will complete cognitive linguistic assessment in first 2 sessions  Baseline: Goal status: met  2.  Pt will successfully use anomia compensations with min A to do so, for 10 minutes functional simple conversation in 2 sessions Baseline:  Goal status: partially met  3.  Pt will demo how to use SFA with rare min A in 3 sessions Baseline: 01/03/24 Goal status: partially met  4.  Pt will demo [facility] problem solving skills in simple cognitive linguistic tasks in 2 sessions Baseline: 01/03/24 Goal status: partially met   LONG TERM GOALS: Target date:  03/20/24  Pt will improve PROM compared to initial administration Baseline:  Goal status: INITIAL  2.  Pt will participate in 10 minutes mod complex conversation with functional expressive language (using anomia compensations) in 3 sessions Baseline:  Goal status: INITIAL  3.  Pt will demo functional problem solving skills in practical cognitive linguistic tasks in 3 sessions Baseline:  Goal status: deferred, per pt   ASSESSMENT:  CLINICAL IMPRESSION: Patient is a 73 y.o. F who was seen today for treatment of aphasia in light of bilateral frontal CVA, and parietal CVA. See treatment date above for today's date for further details on today's session. OT has d/c'd at this time, and pt states she is satisfied with cognition currently, so ST to FOCUS ON ANOMIA/WORD FINDING. Problem solving goals were d/c'd in light of this. Pt with premorbid dx of ADHD and MCI.   OBJECTIVE IMPAIRMENTS: include attention, memory, awareness, and aphasia. These  impairments are limiting patient from managing medications, managing appointments, managing finances, household responsibilities, ADLs/IADLs, and effectively communicating at home and in community. Factors affecting potential to achieve goals and functional outcome are ability to learn/carryover information and previous level of function. Patient will benefit from skilled SLP services to address above impairments and improve overall function.  REHAB POTENTIAL: Good  PLAN:  SLP FREQUENCY: 2x/week  SLP DURATION: 17 sessions total  PLANNED INTERVENTIONS: Language facilitation, Environmental controls, Cueing hierachy, Cognitive reorganization, Internal/external aids, Functional tasks, Multimodal communication approach, SLP instruction and feedback, Compensatory strategies, Patient/family education, [phone] Treatment of speech (30 or 45 min) , and [id number] (Standardized cognitive assessment)    Racer Quam, CCC-SLP 03/06/2024, 8:31 AM

## 2024-03-06 ENCOUNTER — Ambulatory Visit: Admitting: Adult Health

## 2024-03-06 ENCOUNTER — Telehealth: Payer: Self-pay | Admitting: Internal Medicine

## 2024-03-06 NOTE — Telephone Encounter (Signed)
 Husband [name]) wants to know if he should continue tracking patient's BP and weight.  Husband reported patient's BP has been 105/60, 111/62, 109/62, 107/60 and her HR has been averaging 50's most days.  Husband noted patient is also doing occupational therapy which is harder on her than other and patient's weight fluctuates 2/10 to 3/10 of a pound and ranges 109.6-110 pounds.

## 2024-03-06 NOTE — Telephone Encounter (Signed)
Left message for patient's spouse to call back.  

## 2024-03-06 NOTE — Telephone Encounter (Signed)
 If she is feeling well, OK to continue very low dose metoprolol  but if she begins having issues with any dizziness or blood pressure consistently <105 systolic, would stop metoprolol  and let us  know. Checking BP once or twice a week would be OK with me, just to keep an eye on things.

## 2024-03-06 NOTE — Telephone Encounter (Signed)
 Patient's spouse [name] reports patient's BP/HR and weight has been stable over the past month:  105/60 111/62 109/62 107/60  HR averaging 50's most days.  Weight is stable at 109.6 to 110 lbs.  [name] reports patient is doing well, still recovering from her stroke.  [name] is asking how often should he continue to monitor patient's BP and HR.  Will forward to Dayna Dunn, PA-C to review and advise.

## 2024-03-11 ENCOUNTER — Other Ambulatory Visit: Payer: Self-pay | Admitting: Adult Health

## 2024-03-11 ENCOUNTER — Ambulatory Visit

## 2024-03-11 ENCOUNTER — Ambulatory Visit: Payer: Self-pay

## 2024-03-11 DIAGNOSIS — R41841 Cognitive communication deficit: Secondary | ICD-10-CM

## 2024-03-11 DIAGNOSIS — R41840 Attention and concentration deficit: Secondary | ICD-10-CM | POA: Diagnosis not present

## 2024-03-11 DIAGNOSIS — F909 Attention-deficit hyperactivity disorder, unspecified type: Secondary | ICD-10-CM

## 2024-03-11 DIAGNOSIS — R4701 Aphasia: Secondary | ICD-10-CM

## 2024-03-11 NOTE — Telephone Encounter (Signed)
 FYI Only or Action Required?: FYI only for provider.  Patient was last seen in primary care on 03/05/2024 by Merna Huxley, NP.  Called Nurse Triage reporting Joint Swelling.  Symptoms began today.  Interventions attempted: Nothing.  Symptoms are: unchanged.  Triage Disposition: See Physician Within 24 Hours  Patient/caregiver understands and will follow disposition?: Yes         Copied from CRM #[id number]. Topic: Clinical - Red Word Triage >> Mar 11, 2024 11:28 AM Gabrielle Vargas wrote: Red Word that prompted transfer to Nurse Triage: Pt's husband Gabrielle Vargas states that patient's left ankle is red and swollen, inflamed. This began this morning. No recent falls or injuries, no visible bites. Husband thinks it is cellulitis. Had cellulitis previously on the thumb.        Reason for Disposition  Looks like a boil, infected sore, deep ulcer or other infected rash (spreading redness, pus)  Answer Assessment - Initial Assessment Questions 1. LOCATION: Which ankle is swollen? Where is the swelling?     Left ankle  2. ONSET: When did the swelling start?     Today  3. SWELLING: How bad is the swelling? Or, How large is it? (e.g., mild, moderate, severe; size of localized swelling)      Mild  4. PAIN: Is there any pain? If Yes, ask: How bad is it? (Scale 0-10; or none, mild, moderate, severe)     Mild  5. CAUSE: What do you think caused the ankle swelling?     Unsure  6. OTHER SYMPTOMS: Do you have any other symptoms? (e.g., fever, chest pain, difficulty breathing, calf pain)     Redness to the leg  Protocols used: Ankle Swelling-A-AH

## 2024-03-11 NOTE — Therapy (Signed)
 OUTPATIENT SPEECH LANGUAGE PATHOLOGY TREATMENT   [name] MRN: [id number] DOB:03-02-1951, 73 y.o., female Today's Date: 03/11/2024  PCP: Merna Huxley, NP REFERRING PROVIDER: Carilyn Prentice BRAVO, MD  END OF SESSION:  End of Session - 03/11/24 1239     Visit Number 16    Number of Visits 17    Date for SLP Re-Evaluation 03/20/24    SLP Start Time 1236    SLP Stop Time  1316    SLP Time Calculation (min) 40 min    Activity Tolerance Patient tolerated treatment well                   Past Medical History:  Diagnosis Date   Acute combined systolic and diastolic heart failure 10/11/2023   ADHD (attention deficit hyperactivity disorder) 05/09/2007   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; multiple punctate foci of restricted diffusion in bilateral frontal lobes and the left parietal lobe   Diverticulitis of colon (without mention of hemorrhage) 09/17/2013   Essential hypertension 10/09/2017   HFrEF (heart failure with reduced ejection fraction) 10/10/2023   History of COVID-19 2021   Hyperlipidemia    Hypothyroidism    Low back pain radiating to right leg 10/09/2017   Mild cognitive impairment of uncertain or unknown etiology 08/04/2022   Mild intermittent asthma with acute exacerbation 05/30/2017   Mild neurocognitive disorder due to multiple etiologies 12/19/2023   NSVT (nonsustained ventricular tachycardia) 10/12/2023   Osteoarthritis    Postmenopausal atrophic vaginitis, severe 08/18/2008   Right knee pain 03/13/2014   S/P arthroscopy of right knee 06/03/2014   Vitamin B12 deficiency    Past Surgical History:  Procedure Laterality Date   ABDOMINAL HYSTERECTOMY     BREAST SURGERY     CARPAL TUNNEL RELEASE Bilateral    CERVICAL DISC ARTHROPLASTY     CESAREAN SECTION     x2   left shoulder surgery     LOOP RECORDER INSERTION N/A 10/12/2023   Procedure: LOOP RECORDER INSERTION;  Surgeon: Cindie Ole DASEN,  MD;  Location: [facility];  Service: Cardiovascular;  Laterality: N/A;   MENISCUS REPAIR Right 2017   REDUCTION MAMMAPLASTY Bilateral    right shoulder     TONSILLECTOMY     Patient Active Problem List   Diagnosis Date Noted   Mild neurocognitive disorder due to multiple etiologies 12/19/2023   NSVT (nonsustained ventricular tachycardia) 10/12/2023   Acute combined systolic and diastolic heart failure 10/11/2023   HFrEF (heart failure with reduced ejection fraction) 10/10/2023   CVA (cerebral vascular accident) 10/08/2023   Mild cognitive impairment of uncertain or unknown etiology 08/04/2022   Vitamin B12 deficiency    Osteoarthritis 08/03/2022   Hyperlipidemia 08/03/2022   Essential hypertension 10/09/2017   Low back pain radiating to right leg 10/09/2017   Mild intermittent asthma with acute exacerbation 05/30/2017   S/P arthroscopy of right knee 06/03/2014   Right knee pain 03/13/2014   Diverticulitis of colon (without mention of hemorrhage) 09/17/2013   Postmenopausal atrophic vaginitis, severe 08/18/2008   Hypothyroidism 05/09/2007   ADHD (attention deficit hyperactivity disorder) 05/09/2007     ONSET DATE:  10/08/23  REFERRING DIAG: P30.679 (ICD-10-CM) - Aphasia as late effect of stroke  THERAPY DIAG:  Aphasia  Cognitive communication deficit  Rationale for Evaluation and Treatment: Rehabilitation  SUBJECTIVE:   SUBJECTIVE STATEMENT: [name]) The last 4 days she's been having more trouble paying attention, finding words,.     Pt accompanied by: self  PERTINENT HISTORY: HPI: Pt presented [facility] ED on 10/08/2023 with sudden onset of left facial droop and left-sided weakness. ADHD, HTN, diverticulitis, HLD, hypothyroidism, low back pain, neck fusion, mild cognitive impairment (dx Jan 2024), cervical disc arthroplasty, OA in R knee, asthma   PAIN:  Are you having pain? No  FALLS: Has patient fallen in last 6 months?  No  PATIENT GOALS: Improve  language function, memory  OBJECTIVE:  Note: Objective measures were completed at Evaluation unless otherwise noted.  DIAGNOSTIC FINDINGS:  MRI 10/09/23 IMPRESSION: 1. Findings compatible with multiple punctate acute infarcts in bilateral frontal and left parietal lobes. Given involvement of multiple vascular territories, consider an embolic etiology. 2. Significantly motion limited study.   Date of Discharge from SLP service:October 19, 2023 Clinical Impression/Discharge Summary: Pt has made great gains and has met 4 of 6 LTG's this admission due to improved cognition and language. Pt is currently an overall mod I A for orientation tasks, though requires up to [facility] for attention. Patient requires mod I A for expressive language, though continues to require occasional min-modA for complex receptive language tasks.  Pt/family education complete and pt will discharge home with 24 hour supervision from friends/family/etc. Pt would benefit from OP f/u ST services to maximize cognition and communication in order to maximize functional independence.     STANDARDIZED ASSESSMENTS:   Cognitive Linguistic Quick Test  AGE - 70-89  The Cognitive Linguistic Quick Test (CLQT) was administered to assess the relative status of five cognitive domains: attention, memory, language, executive functioning, and visuospatial skills. Scores from 10 tasks were used to estimate severity ratings (standardized for age groups 18-69 years and 70-89 years) for each domain, a clock drawing task, as well as an overall composite severity rating of cognition.     Task Score Criterion Cut Scores  Personal Facts 8/8 8  Symbol Cancellation 12/12 10  Confrontation Naming 10/10 10  Clock Drawing  12/13 11  Story Retelling 8/10 5  Symbol Trails 10/10 6  Generative Naming 3/9 4  Design Memory 4/6 4  Mazes  4/8 4  Design Generation 3/13 5   Cognitive Domain Composite Score Severity Rating  Attention 181/215 WNL  Memory 147/185  WNL  Executive Function 20/40 Low-WNL  Language 29/37 Low-WNL  Visuospatial Skills 75/105 WNL  Clock Drawing  12/13 WNL  Composite Severity Rating  WNL     PATIENT REPORTED OUTCOME MEASURES (PROM): Communication Effectiveness Survey: 12/20/23 - pt scored herself 18/32, with higher scores indicating less of a negative effect of pt's deficits on daily life and QOL.                                                                                                                              TREATMENT DATE:  Semantic Feature Analysis (SFA)  03/11/24: Pt was taken off Wellbutrin  03/05/24 and starting approx 03/07/24 exhibited reduced attention and reduced ability to communicate verbally. Pt agreed with SLP when  SLP asked her if she felt scattered. Pt also said she feels more withdrawn than she has previosly.  SLP reviewed pt's homework - [name] assisted with her homework Pt's verbal expression was not remarkably different than previous sessions except she appeared to have slightly more difficulty with verbal expression in specific conversation about daughter's artwork. In three instances today In 2/3 instances today, the SLP asked the patient direct questions to prompt use of more specific words. During this time of mod SLP cues, the patient used the target word in a sentence in 10-30 seconds and was unaware of her success.  03/05/24: SLP reviewed pt's homework with her - req'd mod-max A consistently for words associated with a given concept. SLP modeled cueing methods for [name] in order for him to assist pt at home with this task/these anomia tasks. SLP educated pt/husband that it appears her premorbid cognitive differences are making it more challenging for her to experience more success - SLP does not usually need to provide this level of cueing on these lower-level tasks for pts post CVA. Pt may not return to baseline with her deficits presenting the way they are at this point post CVA.  02/28/24:  Pt reports no frustrations or problems as of now with cognition currently and she is satisfied to focus on anomia in ST beginning next session. SLP suggested pt keep track of her own homework in a manner she desires. SLP assisted pt with deciding she could do this with her clipboard, and provide homework to SLP at beginning of sessions. SLP reviewed pt's homework with her. Pt unaware of 2 details on detailed written instructions. SLP gave consistent mod A for [place] . Homework provided for word finding.  02/26/24:  SLP assisted pt with error awareness and reviewed homework with her - pt req'd min-mod A for awareness with this task (map). Her groceries task demonstrated min decr'd organization, req'd mod A for error awareness, and she req'd mod semantic cues during this task to generate dairy due to anomia. SLP encouraged pt and [name] to cont SFA at home PRN. They will complete smoke alarm with SFA.   02/19/24: SLP assisted pt in problem solving with [employer] given the information she found. Initially she felt like she could keep up but SLP talked further with pt about prophecy books and she decided this was not best for her to participate. Pt had difficulty arriving at a way she could participate but not necessarily do al lthe work necessary and SLP helped pt realize other options (problem solve). Pt is  now on Wellbutrin , which she and [name] will assess if attention is improved or same with this med compared to Adderall.   02/15/24: Pt with discussion with PCP this afternoon about meds, Adderall is not recommended for her due to cardiac health. Pt's attention has decr'd since CVA but pt's attention med was reduced to half dose than prior to CVA. SLP took time to explain/educate pt and husband why an attention med would cont to be helpful for pt from a therapeutic perspective. SLP ascertained that pt's anomia has improved but not dramatically since her hospital d/c for  CVA. SLP to cont with pt for anomia/verbal expression and for problem solving in a functional context. Pt and husband agree with this plan.   02/02/24: SLP worked with pt today with word finding using divergent naming tasks with salient categories for pt. SLP req'd to provide mod cues occasionally for pt's success. SLP wonders  if pt's premorbid memory deficit/MCI is impeding pt progress with anomia. SLP instructed pt to cont SFA and naming tasks at home. Pt completing word/language tasks with aphasia book at home, regularly.  01/23/24: Bunnie required incr'd cue level from [place] as steps increased for her homework. Today SLP with long discussion what pt could complete with husband at home to work with china attention and organization. Suggested cooking, baking, or organizing pantry or junk drawer in the kitchen, with steps written out in an organizational manner prior to beginning her work.  SLP also discussed with pt and [name] about pt keeping her own calendar at this time, with [name] assisting PRN. SLP expained that over time pt would do this independently but at this time she will need George's assistance and that this was a stepping stone to her eventually keeping her own calendar independently like she did prior to CVA.   01/08/24: Pt with 3 hour visit this morning from sister, and brother (from [facility]). Today SLP helped pt with occasional mod A with problem solving/sequencing task. She req'd extra time and redirection at times with 13-step sequencing task. Took pt 17 minutes for this task. Level of fatigue at which pt entered may have played a role.  [name] had question about 6-step sequencing tasks- he could not find any online. SLP suggested he and pt think through some everyday tasks and write down 6 steps in them and SLP provided 7-8 examples.  01/03/24: Pt brought homework in completed. She enjoyed sequencing task. With simple problem solving tasks pt req'd rare min A, but performance was  functional.  Her speech today was WNL rate, without anomia. When she has anomia, she uses synonym or description strategy. SLP observed pt to use description strategy in previous sessions. Homework provided.  01/01/24: Pt did not complete any SFA since last session. SLP told pt to complete when she has anomia with a noun. Pt brought homework with her completed. She finished 40 wrtiten definitions in approx 90 minutes. Today SLP targeted simple problem solving with 5-step sequencing task. Pt took almost 10 minutes to complete 4 sequences. She was provided with homework until next session.  12/28/23: Pt brought in books that daughter bought her. SLP to look at these and star pages SLP thinks would be helpful for pt. SLP worked with pt today with SFA - grill. She req'd mod A occasionally for procedure and for more accurate wording, especially description. SLP provided homework for description.   PATIENT EDUCATION: Education details: see treatment date Person educated: Patient and Spouse Education method: Medical illustrator Education comprehension: verbalized understanding and needs further education   GOALS: Goals reviewed with patient? Yes in general  SHORT TERM GOALS: Target date: 01/05/24  Pt will complete cognitive linguistic assessment in first 2 sessions  Baseline: Goal status: met  2.  Pt will successfully use anomia compensations with min A to do so, for 10 minutes functional simple conversation in 2 sessions Baseline:  Goal status: partially met  3.  Pt will demo how to use SFA with rare min A in 3 sessions Baseline: 01/03/24 Goal status: partially met  4.  Pt will demo [facility] problem solving skills in simple cognitive linguistic tasks in 2 sessions Baseline: 01/03/24 Goal status: partially met   LONG TERM GOALS: Target date:  03/20/24  Pt will improve PROM compared to initial administration Baseline:  Goal status: INITIAL  2.  Pt will participate in 10 minutes mod  complex conversation with functional expressive language (  using anomia compensations) in 3 sessions Baseline:  Goal status: INITIAL  3.  Pt will demo functional problem solving skills in practical cognitive linguistic tasks in 3 sessions Baseline:  Goal status: deferred, per pt   ASSESSMENT:  CLINICAL IMPRESSION: Patient is a 73 y.o. F who was seen today for treatment of aphasia in light of bilateral frontal CVA, and parietal CVA. See treatment date above for today's date for further details on today's session. OT has d/c'd at this time, and pt states she is satisfied with cognition currently, so ST to FOCUS ON ANOMIA/WORD FINDING. Problem solving goals were d/c'd in light of this. Pt with premorbid dx of ADHD and MCI.   OBJECTIVE IMPAIRMENTS: include attention, memory, awareness, and aphasia. These impairments are limiting patient from managing medications, managing appointments, managing finances, household responsibilities, ADLs/IADLs, and effectively communicating at home and in community. Factors affecting potential to achieve goals and functional outcome are ability to learn/carryover information and previous level of function. Patient will benefit from skilled SLP services to address above impairments and improve overall function.  REHAB POTENTIAL: Good  PLAN:  SLP FREQUENCY: 2x/week  SLP DURATION: 17 sessions total  PLANNED INTERVENTIONS: Language facilitation, Environmental controls, Cueing hierachy, Cognitive reorganization, Internal/external aids, Functional tasks, Multimodal communication approach, SLP instruction and feedback, Compensatory strategies, Patient/family education, [phone] Treatment of speech (30 or 45 min) , and [phone] (Standardized cognitive assessment)    Tiffanny Lamarche, CCC-SLP 03/11/2024, 12:39 PM

## 2024-03-12 ENCOUNTER — Encounter: Payer: Self-pay | Admitting: Family Medicine

## 2024-03-12 ENCOUNTER — Ambulatory Visit (INDEPENDENT_AMBULATORY_CARE_PROVIDER_SITE_OTHER): Admitting: Family Medicine

## 2024-03-12 VITALS — BP 120/82 | HR 55 | Temp 97.5°F | Ht 59.0 in | Wt 109.1 lb

## 2024-03-12 DIAGNOSIS — R21 Rash and other nonspecific skin eruption: Secondary | ICD-10-CM

## 2024-03-12 NOTE — Progress Notes (Signed)
   Acute Office Visit  Subjective:     Patient ID: Gabrielle Vargas, female    DOB: 10-29-1950, 73 y.o.   MRN: [id number]  Chief Complaint  Patient presents with   Edema    Patient complains of sudden onset of left ankle redness x1 day, no known injury    HPI  Discussed the use of AI scribe software for clinical note transcription with the patient, who gave verbal consent to proceed.  History of Present Illness   Gabrielle Vargas is a 73 year old female who presents with redness on her left ankle.  She noticed the redness on her left ankle starting two days ago. It is not accompanied by swelling, soreness, or tenderness. She has a history of cellulitis about a month ago of her right thumb and is concerned about a possible cellulitis in her left ankle.  The redness was more noticeable yesterday. There is no associated itchiness, pain, fever, chills, difficulty breathing, or chest pain.  She engages in daily walking but has not done anything specific that might have caused the redness.       Review of Systems  Constitutional:  Negative for chills, diaphoresis and fever.  Musculoskeletal:  Negative for falls and joint pain.        Objective:    BP 120/82   Pulse (!) 55   Temp (!) 97.5 F (36.4 C) (Oral)   Ht 4' 11 (1.499 m)   Wt 109 lb 1.6 oz (49.5 kg)   SpO2 99%   BMI 22.04 kg/m    Physical Exam Vitals reviewed.  Constitutional:      Appearance: Normal appearance. She is normal weight.  Neurological:     Mental Status: She is alert and oriented to person, place, and time. Mental status is at baseline.  Psychiatric:        Mood and Affect: Mood normal.        Behavior: Behavior normal.     No results found for any visits on 03/12/24.      Assessment & Plan:   Problem List Items Addressed This Visit   None Visit Diagnoses       Rash    -  Primary     There is a slight redness of the skin of the ankle but no other significant  findings. reassured patient that there does not appear to be infection or other significant pathology at this time. Counseled patient on the warning signs of blood clot or infection, she was advised to notify us  if anything changes in her symptoms. Assessment and Plan    Redness of skin, left ankle Redness noted without swelling, pain, or itchiness.  Improvement observed. - Advise monitoring for changes in color, size, or symptoms. - Return if swelling, pain, or other symptoms develop.       No orders of the defined types were placed in this encounter.   No follow-ups on file.  Heron CHRISTELLA Sharper, MD

## 2024-03-13 ENCOUNTER — Ambulatory Visit

## 2024-03-13 DIAGNOSIS — R41840 Attention and concentration deficit: Secondary | ICD-10-CM | POA: Diagnosis not present

## 2024-03-13 DIAGNOSIS — R41841 Cognitive communication deficit: Secondary | ICD-10-CM

## 2024-03-13 NOTE — Therapy (Signed)
 OUTPATIENT SPEECH LANGUAGE PATHOLOGY TREATMENT/RECERTIFICATION   [name] MRN: [id number] DOB:01-31-1951, 73 y.o., female Today's Date: 03/13/2024  PCP: Merna Huxley, NP REFERRING PROVIDER: Carilyn Prentice BRAVO, MD  END OF SESSION:  End of Session - 03/13/24 1400     Visit Number 17    Number of Visits 22    Date for SLP Re-Evaluation 05/10/24   pt to begin x1/week x4 weeks the week of 04/15/24   SLP Start Time 1320    SLP Stop Time  1401    SLP Time Calculation (min) 41 min    Activity Tolerance Patient tolerated treatment well                    Past Medical History:  Diagnosis Date   Acute combined systolic and diastolic heart failure 10/11/2023   ADHD (attention deficit hyperactivity disorder) 05/09/2007   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; multiple punctate foci of restricted diffusion in bilateral frontal lobes and the left parietal lobe   Diverticulitis of colon (without mention of hemorrhage) 09/17/2013   Essential hypertension 10/09/2017   HFrEF (heart failure with reduced ejection fraction) 10/10/2023   History of COVID-19 2021   Hyperlipidemia    Hypothyroidism    Low back pain radiating to right leg 10/09/2017   Mild cognitive impairment of uncertain or unknown etiology 08/04/2022   Mild intermittent asthma with acute exacerbation 05/30/2017   Mild neurocognitive disorder due to multiple etiologies 12/19/2023   NSVT (nonsustained ventricular tachycardia) 10/12/2023   Osteoarthritis    Postmenopausal atrophic vaginitis, severe 08/18/2008   Right knee pain 03/13/2014   S/P arthroscopy of right knee 06/03/2014   Vitamin B12 deficiency    Past Surgical History:  Procedure Laterality Date   ABDOMINAL HYSTERECTOMY     BREAST SURGERY     CARPAL TUNNEL RELEASE Bilateral    CERVICAL DISC ARTHROPLASTY     CESAREAN SECTION     x2   left shoulder surgery     LOOP RECORDER INSERTION N/A 10/12/2023    Procedure: LOOP RECORDER INSERTION;  Surgeon: Cindie Ole DASEN, MD;  Location: [facility];  Service: Cardiovascular;  Laterality: N/A;   MENISCUS REPAIR Right 2017   REDUCTION MAMMAPLASTY Bilateral    right shoulder     TONSILLECTOMY     Patient Active Problem List   Diagnosis Date Noted   Mild neurocognitive disorder due to multiple etiologies 12/19/2023   NSVT (nonsustained ventricular tachycardia) 10/12/2023   Acute combined systolic and diastolic heart failure 10/11/2023   HFrEF (heart failure with reduced ejection fraction) 10/10/2023   CVA (cerebral vascular accident) 10/08/2023   Mild cognitive impairment of uncertain or unknown etiology 08/04/2022   Vitamin B12 deficiency    Osteoarthritis 08/03/2022   Hyperlipidemia 08/03/2022   Essential hypertension 10/09/2017   Low back pain radiating to right leg 10/09/2017   Mild intermittent asthma with acute exacerbation 05/30/2017   S/P arthroscopy of right knee 06/03/2014   Right knee pain 03/13/2014   Diverticulitis of colon (without mention of hemorrhage) 09/17/2013   Postmenopausal atrophic vaginitis, severe 08/18/2008   Hypothyroidism 05/09/2007   ADHD (attention deficit hyperactivity disorder) 05/09/2007   Speech Therapy Progress Note  Dates of Reporting Period: 02/02/24 to present  Subjective Statement: Pt has been seen for 17 total sessions targeting cognition and expressive language (verbal expression).  Objective: Pt has improved with her awareness, attention, and executive function. SLP believes pt's difficulty with expressive (verbal) language  is cognitively-based and her premorbid MCI and ADHD are complicating speed of recovery of these skills. SLP believes pt's overall mental fatigue plays a significant role in the degree of difficulty she has with linguistic organization/processing.   Goal Update: See below.   Plan: See pt for once/week for 5 more weeks. After next week pt's next ST is the week of  04/15/24  Reason Skilled Services are Required: ensure pt and husband have what they need at home to cont home tasks. May decr to once every other week if clinically appropriate after visit week of 04/15/24.   ONSET DATE:  10/08/23  REFERRING DIAG: P30.679 (ICD-10-CM) - Aphasia as late effect of stroke  THERAPY DIAG:  Cognitive communication deficit  Rationale for Evaluation and Treatment: Rehabilitation  SUBJECTIVE:   SUBJECTIVE STATEMENT: I feel a lot better today.     Pt accompanied by: self   PERTINENT HISTORY: HPI: Pt presented to [facility] ED on 10/08/2023 with sudden onset of left facial droop and left-sided weakness. ADHD, HTN, diverticulitis, HLD, hypothyroidism, low back pain, neck fusion, mild cognitive impairment (dx Jan 2024), cervical disc arthroplasty, OA in R knee, asthma   PAIN:  Are you having pain? No  FALLS: Has patient fallen in last 6 months?  No  PATIENT GOALS: Improve language function, memory  OBJECTIVE:  Note: Objective measures were completed at Evaluation unless otherwise noted.  DIAGNOSTIC FINDINGS:  MRI 10/09/23 IMPRESSION: 1. Findings compatible with multiple punctate acute infarcts in bilateral frontal and left parietal lobes. Given involvement of multiple vascular territories, consider an embolic etiology. 2. Significantly motion limited study.   Date of Discharge from SLP service:October 19, 2023 Clinical Impression/Discharge Summary: Pt has made great gains and has met 4 of 6 LTG's this admission due to improved cognition and language. Pt is currently an overall mod I A for orientation tasks, though requires up to [facility] for attention. Patient requires mod I A for expressive language, though continues to require occasional min-modA for complex receptive language tasks.  Pt/family education complete and pt will discharge home with 24 hour supervision from friends/family/etc. Pt would benefit from OP f/u ST services to maximize cognition and communication  in order to maximize functional independence.     STANDARDIZED ASSESSMENTS:   Cognitive Linguistic Quick Test  AGE - 70-89  The Cognitive Linguistic Quick Test (CLQT) was administered to assess the relative status of five cognitive domains: attention, memory, language, executive functioning, and visuospatial skills. Scores from 10 tasks were used to estimate severity ratings (standardized for age groups 18-69 years and 70-89 years) for each domain, a clock drawing task, as well as an overall composite severity rating of cognition.     Task Score Criterion Cut Scores  Personal Facts 8/8 8  Symbol Cancellation 12/12 10  Confrontation Naming 10/10 10  Clock Drawing  12/13 11  Story Retelling 8/10 5  Symbol Trails 10/10 6  Generative Naming 3/9 4  Design Memory 4/6 4  Mazes  4/8 4  Design Generation 3/13 5   Cognitive Domain Composite Score Severity Rating  Attention 181/215 WNL  Memory 147/185 WNL  Executive Function 20/40 Low-WNL  Language 29/37 Low-WNL  Visuospatial Skills 75/105 WNL  Clock Drawing  12/13 WNL  Composite Severity Rating  WNL     PATIENT REPORTED OUTCOME MEASURES (PROM): Communication Effectiveness Survey: 12/20/23 - pt scored herself 18/32, with higher scores indicating less of a negative effect of pt's deficits on daily life and QOL.  TREATMENT DATE:  Semantic Feature Analysis (SFA)  03/13/24: SLP targeted cognitive word finding with pt today and assisted her with dual/triple meaning words. She req'd consistent cues for a third definition, verbal fluency was WNL for this task. Pt speed of response in conversation and in therapy tasks was quicker today than 03/11/24. Discussion about ST frequency after next week - pt with one appointment left. Pt, husband and SLP decided once/week for 4 weeks is best. Pt to scheudle more ST (after next week) in  four weeks due to SLP current schedule. SLP spent much of the remainder of the session educating pt and husband what she could do in that interim.   03/11/24: Pt was taken off Wellbutrin  03/05/24 and starting approx 03/07/24 exhibited reduced attention and reduced ability to communicate verbally. Pt agreed with SLP when SLP asked her if she felt scattered. Pt also said she feels more withdrawn than she has previosly.  SLP reviewed pt's homework - [name] assisted with her homework Pt's verbal expression was not remarkably different than previous sessions except she appeared to have slightly more difficulty with verbal expression in specific conversation about daughter's artwork. In three instances today In 2/3 instances today, the SLP asked the patient direct questions to prompt use of more specific words. During this time of mod SLP cues, the patient used the target word in a sentence in 10-30 seconds and was unaware of her success.  03/05/24: SLP reviewed pt's homework with her - req'd mod-max A consistently for words associated with a given concept. SLP modeled cueing methods for [name] in order for him to assist pt at home with this task/these anomia tasks. SLP educated pt/husband that it appears her premorbid cognitive differences are making it more challenging for her to experience more success - SLP does not usually need to provide this level of cueing on these lower-level tasks for pts post CVA. Pt may not return to baseline with her deficits presenting the way they are at this point post CVA.  02/28/24: Pt reports no frustrations or problems as of now with cognition currently and she is satisfied to focus on anomia in ST beginning next session. SLP suggested pt keep track of her own homework in a manner she desires. SLP assisted pt with deciding she could do this with her clipboard, and provide homework to SLP at beginning of sessions. SLP reviewed pt's homework with her. Pt unaware of 2 details on  detailed written instructions. SLP gave consistent mod A for [place] . Homework provided for word finding.  02/26/24:  SLP assisted pt with error awareness and reviewed homework with her - pt req'd min-mod A for awareness with this task (map). Her groceries task demonstrated min decr'd organization, req'd mod A for error awareness, and she req'd mod semantic cues during this task to generate dairy due to anomia. SLP encouraged pt and [name] to cont SFA at home PRN. They will complete smoke alarm with SFA.   02/19/24: SLP assisted pt in problem solving with [employer] given the information she found. Initially she felt like she could keep up but SLP talked further with pt about prophecy books and she decided this was not best for her to participate. Pt had difficulty arriving at a way she could participate but not necessarily do al lthe work necessary and SLP helped pt realize other options (problem solve). Pt is  now on Wellbutrin , which she and [name] will assess if attention is improved or same  with this med compared to Adderall.   02/15/24: Pt with discussion with PCP this afternoon about meds, Adderall is not recommended for her due to cardiac health. Pt's attention has decr'd since CVA but pt's attention med was reduced to half dose than prior to CVA. SLP took time to explain/educate pt and husband why an attention med would cont to be helpful for pt from a therapeutic perspective. SLP ascertained that pt's anomia has improved but not dramatically since her hospital d/c for CVA. SLP to cont with pt for anomia/verbal expression and for problem solving in a functional context. Pt and husband agree with this plan.   02/02/24: SLP worked with pt today with word finding using divergent naming tasks with salient categories for pt. SLP req'd to provide mod cues occasionally for pt's success. SLP wonders if pt's premorbid memory deficit/MCI is impeding pt progress with anomia.  SLP instructed pt to cont SFA and naming tasks at home. Pt completing word/language tasks with aphasia book at home, regularly.  01/23/24: Bunnie required incr'd cue level from Kimberly as steps increased for her homework. Today SLP with long discussion what pt could complete with husband at home to work with china attention and organization. Suggested cooking, baking, or organizing pantry or junk drawer in the kitchen, with steps written out in an organizational manner prior to beginning her work.  SLP also discussed with pt and [name] about pt keeping her own calendar at this time, with [name] assisting PRN. SLP expained that over time pt would do this independently but at this time she will need George's assistance and that this was a stepping stone to her eventually keeping her own calendar independently like she did prior to CVA.   01/08/24: Pt with 3 hour visit this morning from sister, and brother (from [place]). Today SLP helped pt with occasional mod A with problem solving/sequencing task. She req'd extra time and redirection at times with 13-step sequencing task. Took pt 17 minutes for this task. Level of fatigue at which pt entered may have played a role.  [name] had question about 6-step sequencing tasks- he could not find any online. SLP suggested he and pt think through some everyday tasks and write down 6 steps in them and SLP provided 7-8 examples.  01/03/24: Pt brought homework in completed. She enjoyed sequencing task. With simple problem solving tasks pt req'd rare min A, but performance was functional.  Her speech today was WNL rate, without anomia. When she has anomia, she uses synonym or description strategy. SLP observed pt to use description strategy in previous sessions. Homework provided.  01/01/24: Pt did not complete any SFA since last session. SLP told pt to complete when she has anomia with a noun. Pt brought homework with her completed. She finished 40 wrtiten definitions in  approx 90 minutes. Today SLP targeted simple problem solving with 5-step sequencing task. Pt took almost 10 minutes to complete 4 sequences. She was provided with homework until next session.  12/28/23: Pt brought in books that daughter bought her. SLP to look at these and star pages SLP thinks would be helpful for pt. SLP worked with pt today with SFA - grill. She req'd mod A occasionally for procedure and for more accurate wording, especially description. SLP provided homework for description.   PATIENT EDUCATION: Education details: see treatment date Person educated: Patient and Spouse Education method: Medical illustrator Education comprehension: verbalized understanding and needs further education   GOALS: Goals reviewed with patient? Yes  in general  SHORT TERM GOALS: Target date: 01/05/24  Pt will complete cognitive linguistic assessment in first 2 sessions  Baseline: Goal status: met  2.  Pt will successfully use anomia compensations with min A to do so, for 10 minutes functional simple conversation in 2 sessions Baseline:  Goal status: partially met  3.  Pt will demo how to use SFA with rare min A in 3 sessions Baseline: 01/03/24 Goal status: partially met  4.  Pt will demo [facility] problem solving skills in simple cognitive linguistic tasks in 2 sessions Baseline: 01/03/24 Goal status: partially met   LONG TERM GOALS: Target date:   05/10/24  Pt will improve PROM compared to initial administration Baseline:  Goal status: continue  2.  Pt will participate in 10 minutes mod complex conversation with functional expressive language (using anomia compensations) in 3 sessions Baseline: 03/13/24 Goal status: Partially met and cont  3.  Pt will demo functional problem solving skills in practical cognitive linguistic tasks in 3 sessions Baseline:  Goal status: deferred, per pt   ASSESSMENT:  CLINICAL IMPRESSION: RECERT TODAY. See ST Pprogress Note, above.  Patient is a 73 y.o. F who was seen today for treatment of aphasia in light of bilateral frontal CVA, and parietal CVA. See treatment date above for today's date for further details on today's session. OT has d/c'd at this time, and pt states she is satisfied with cognition currently, so ST to FOCUS ON VERBAL EXPRESSION . Problem solving goals were d/c'd in light of this. Pt with premorbid dx of ADHD and MCI.   OBJECTIVE IMPAIRMENTS: include attention, memory, awareness, and aphasia. These impairments are limiting patient from managing medications, managing appointments, managing finances, household responsibilities, ADLs/IADLs, and effectively communicating at home and in community. Factors affecting potential to achieve goals and functional outcome are ability to learn/carryover information and previous level of function. Patient will benefit from skilled SLP services to address above impairments and improve overall function.  REHAB POTENTIAL: Good  PLAN:  SLP FREQUENCY: 1x/week  SLP DURATION: 22 sessions total  PLANNED INTERVENTIONS: Language facilitation, Environmental controls, Cueing hierachy, Cognitive reorganization, Internal/external aids, Functional tasks, Multimodal communication approach, SLP instruction and feedback, Compensatory strategies, Patient/family education, [phone] Treatment of speech (30 or 45 min) , and [id number] (Standardized cognitive assessment)    Chung Chagoya, CCC-SLP 03/13/2024, 5:55 PM

## 2024-03-19 ENCOUNTER — Ambulatory Visit: Attending: Physical Medicine & Rehabilitation

## 2024-03-19 DIAGNOSIS — R4701 Aphasia: Secondary | ICD-10-CM | POA: Diagnosis not present

## 2024-03-19 DIAGNOSIS — R41841 Cognitive communication deficit: Secondary | ICD-10-CM | POA: Insufficient documentation

## 2024-03-19 NOTE — Therapy (Signed)
 OUTPATIENT SPEECH LANGUAGE PATHOLOGY TREATMENT   [name] MRN: [id number] DOB:05-29-1951, 73 y.o., female Today's Date: 03/19/2024  PCP: Merna Huxley, NP REFERRING PROVIDER: Carilyn Prentice BRAVO, MD  END OF SESSION:  End of Session - 03/19/24 1406     Visit Number 18    Number of Visits 22    Date for SLP Re-Evaluation 05/10/24   pt to begin x1/week x4 weeks the week of 04/15/24   SLP Start Time 1318    SLP Stop Time  1400    SLP Time Calculation (min) 42 min    Activity Tolerance Patient tolerated treatment well                     Past Medical History:  Diagnosis Date   Acute combined systolic and diastolic heart failure 10/11/2023   ADHD (attention deficit hyperactivity disorder) 05/09/2007   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; multiple punctate foci of restricted diffusion in bilateral frontal lobes and the left parietal lobe   Diverticulitis of colon (without mention of hemorrhage) 09/17/2013   Essential hypertension 10/09/2017   HFrEF (heart failure with reduced ejection fraction) 10/10/2023   History of COVID-19 2021   Hyperlipidemia    Hypothyroidism    Low back pain radiating to right leg 10/09/2017   Mild cognitive impairment of uncertain or unknown etiology 08/04/2022   Mild intermittent asthma with acute exacerbation 05/30/2017   Mild neurocognitive disorder due to multiple etiologies 12/19/2023   NSVT (nonsustained ventricular tachycardia) 10/12/2023   Osteoarthritis    Postmenopausal atrophic vaginitis, severe 08/18/2008   Right knee pain 03/13/2014   S/P arthroscopy of right knee 06/03/2014   Vitamin B12 deficiency    Past Surgical History:  Procedure Laterality Date   ABDOMINAL HYSTERECTOMY     BREAST SURGERY     CARPAL TUNNEL RELEASE Bilateral    CERVICAL DISC ARTHROPLASTY     CESAREAN SECTION     x2   left shoulder surgery     LOOP RECORDER INSERTION N/A 10/12/2023   Procedure:  LOOP RECORDER INSERTION;  Surgeon: Cindie Ole DASEN, MD;  Location: [facility];  Service: Cardiovascular;  Laterality: N/A;   MENISCUS REPAIR Right 2017   REDUCTION MAMMAPLASTY Bilateral    right shoulder     TONSILLECTOMY     Patient Active Problem List   Diagnosis Date Noted   Mild neurocognitive disorder due to multiple etiologies 12/19/2023   NSVT (nonsustained ventricular tachycardia) 10/12/2023   Acute combined systolic and diastolic heart failure 10/11/2023   HFrEF (heart failure with reduced ejection fraction) 10/10/2023   CVA (cerebral vascular accident) 10/08/2023   Mild cognitive impairment of uncertain or unknown etiology 08/04/2022   Vitamin B12 deficiency    Osteoarthritis 08/03/2022   Hyperlipidemia 08/03/2022   Essential hypertension 10/09/2017   Low back pain radiating to right leg 10/09/2017   Mild intermittent asthma with acute exacerbation 05/30/2017   S/P arthroscopy of right knee 06/03/2014   Right knee pain 03/13/2014   Diverticulitis of colon (without mention of hemorrhage) 09/17/2013   Postmenopausal atrophic vaginitis, severe 08/18/2008   Hypothyroidism 05/09/2007   ADHD (attention deficit hyperactivity disorder) 05/09/2007     ONSET DATE:  10/08/23  REFERRING DIAG: P30.679 (ICD-10-CM) - Aphasia as late effect of stroke  THERAPY DIAG:  Cognitive communication deficit  Aphasia  Rationale for Evaluation and Treatment: Rehabilitation  SUBJECTIVE:   SUBJECTIVE STATEMENT: I won't see you again until - October!  Pt accompanied by: self   PERTINENT HISTORY: HPI: Pt presented to [facility] on 10/08/2023 with sudden onset of left facial droop and left-sided weakness. ADHD, HTN, diverticulitis, HLD, hypothyroidism, low back pain, neck fusion, mild cognitive impairment (dx Jan 2024), cervical disc arthroplasty, OA in R knee, asthma   PAIN:  Are you having pain? No  FALLS: Has patient fallen in last 6 months?  No  PATIENT GOALS: Improve  language function, memory  OBJECTIVE:  Note: Objective measures were completed at Evaluation unless otherwise noted.  DIAGNOSTIC FINDINGS:  MRI 10/09/23 IMPRESSION: 1. Findings compatible with multiple punctate acute infarcts in bilateral frontal and left parietal lobes. Given involvement of multiple vascular territories, consider an embolic etiology. 2. Significantly motion limited study.   Date of Discharge from SLP service:October 19, 2023 Clinical Impression/Discharge Summary: Pt has made great gains and has met 4 of 6 LTG's this admission due to improved cognition and language. Pt is currently an overall mod I A for orientation tasks, though requires up to [facility] for attention. Patient requires mod I A for expressive language, though continues to require occasional min-modA for complex receptive language tasks.  Pt/family education complete and pt will discharge home with 24 hour supervision from friends/family/etc. Pt would benefit from OP f/u ST services to maximize cognition and communication in order to maximize functional independence.     STANDARDIZED ASSESSMENTS:   Cognitive Linguistic Quick Test  AGE - 70-89  The Cognitive Linguistic Quick Test (CLQT) was administered to assess the relative status of five cognitive domains: attention, memory, language, executive functioning, and visuospatial skills. Scores from 10 tasks were used to estimate severity ratings (standardized for age groups 18-69 years and 70-89 years) for each domain, a clock drawing task, as well as an overall composite severity rating of cognition.     Task Score Criterion Cut Scores  Personal Facts 8/8 8  Symbol Cancellation 12/12 10  Confrontation Naming 10/10 10  Clock Drawing  12/13 11  Story Retelling 8/10 5  Symbol Trails 10/10 6  Generative Naming 3/9 4  Design Memory 4/6 4  Mazes  4/8 4  Design Generation 3/13 5   Cognitive Domain Composite Score Severity Rating  Attention 181/215 WNL  Memory 147/185  WNL  Executive Function 20/40 Low-WNL  Language 29/37 Low-WNL  Visuospatial Skills 75/105 WNL  Clock Drawing  12/13 WNL  Composite Severity Rating  WNL     PATIENT REPORTED OUTCOME MEASURES (PROM): Communication Effectiveness Survey: 12/20/23 - pt scored herself 18/32, with higher scores indicating less of a negative effect of pt's deficits on daily life and QOL.                                                                                                                              TREATMENT DATE:  Semantic Feature Analysis (SFA)  03/19/24: [name] states pt still has difficulty with staying on task, occasionally. Pt and husband going on short vacation  next week and to target anomia, pt generated list of what she will take on the trip. Anomia occurred x4, pt with SLP cues to describe x2 (successful for pt to self-generate the word) and she spontaneously described the item with success the last time. One instance (crypt) pt unable to generate correct word given SLP letter and semantic cues. Extra time consistently necessary. She req'd min A back to task due to self-distracting comments. ST to cont to focus on word finding.   03/13/24: SLP targeted cognitive word finding with pt today and assisted her with dual/triple meaning words. She req'd consistent cues for a third definition, verbal fluency was WNL for this task. Pt speed of response in conversation and in therapy tasks was quicker today than 03/11/24. Discussion about ST frequency after next week - pt with one appointment left. Pt, husband and SLP decided once/week for 4 weeks is best. Pt to scheudle more ST (after next week) in four weeks due to SLP current schedule. SLP spent much of the remainder of the session educating pt and husband what she could do in that interim.   03/11/24: Pt was taken off Wellbutrin  03/05/24 and starting approx 03/07/24 exhibited reduced attention and reduced ability to communicate verbally. Pt agreed with SLP when  SLP asked her if she felt scattered. Pt also said she feels more withdrawn than she has previosly.  SLP reviewed pt's homework - [name] assisted with her homework Pt's verbal expression was not remarkably different than previous sessions except she appeared to have slightly more difficulty with verbal expression in specific conversation about daughter's artwork. In three instances today In 2/3 instances today, the SLP asked the patient direct questions to prompt use of more specific words. During this time of mod SLP cues, the patient used the target word in a sentence in 10-30 seconds and was unaware of her success.  03/05/24: SLP reviewed pt's homework with her - req'd mod-max A consistently for words associated with a given concept. SLP modeled cueing methods for [name] in order for him to assist pt at home with this task/these anomia tasks. SLP educated pt/husband that it appears her premorbid cognitive differences are making it more challenging for her to experience more success - SLP does not usually need to provide this level of cueing on these lower-level tasks for pts post CVA. Pt may not return to baseline with her deficits presenting the way they are at this point post CVA.  02/28/24: Pt reports no frustrations or problems as of now with cognition currently and she is satisfied to focus on anomia in ST beginning next session. SLP suggested pt keep track of her own homework in a manner she desires. SLP assisted pt with deciding she could do this with her clipboard, and provide homework to SLP at beginning of sessions. SLP reviewed pt's homework with her. Pt unaware of 2 details on detailed written instructions. SLP gave consistent mod A for [place] . Homework provided for word finding.  02/26/24:  SLP assisted pt with error awareness and reviewed homework with her - pt req'd min-mod A for awareness with this task (map). Her groceries task demonstrated min decr'd organization,  req'd mod A for error awareness, and she req'd mod semantic cues during this task to generate dairy due to anomia. SLP encouraged pt and [name] to cont SFA at home PRN. They will complete smoke alarm with SFA.   02/19/24: SLP assisted pt in problem solving with [employer] given  the information she found. Initially she felt like she could keep up but SLP talked further with pt about prophecy books and she decided this was not best for her to participate. Pt had difficulty arriving at a way she could participate but not necessarily do al lthe work necessary and SLP helped pt realize other options (problem solve). Pt is  now on Wellbutrin , which she and [name] will assess if attention is improved or same with this med compared to Adderall.   02/15/24: Pt with discussion with PCP this afternoon about meds, Adderall is not recommended for her due to cardiac health. Pt's attention has decr'd since CVA but pt's attention med was reduced to half dose than prior to CVA. SLP took time to explain/educate pt and husband why an attention med would cont to be helpful for pt from a therapeutic perspective. SLP ascertained that pt's anomia has improved but not dramatically since her hospital d/c for CVA. SLP to cont with pt for anomia/verbal expression and for problem solving in a functional context. Pt and husband agree with this plan.   02/02/24: SLP worked with pt today with word finding using divergent naming tasks with salient categories for pt. SLP req'd to provide mod cues occasionally for pt's success. SLP wonders if pt's premorbid memory deficit/MCI is impeding pt progress with anomia. SLP instructed pt to cont SFA and naming tasks at home. Pt completing word/language tasks with aphasia book at home, regularly.  01/23/24: Bunnie required incr'd cue level from [place] as steps increased for her homework. Today SLP with long discussion what pt could complete with husband at home to work with [place]ina  attention and organization. Suggested cooking, baking, or organizing pantry or junk drawer in the kitchen, with steps written out in an organizational manner prior to beginning her work.  SLP also discussed with pt and [name] about pt keeping her own calendar at this time, with [name] assisting PRN. SLP expained that over time pt would do this independently but at this time she will need George's assistance and that this was a stepping stone to her eventually keeping her own calendar independently like she did prior to CVA.   01/08/24: Pt with 3 hour visit this Gabrielle from sister, and brother (from [facility]). Today SLP helped pt with occasional mod A with problem solving/sequencing task. She req'd extra time and redirection at times with 13-step sequencing task. Took pt 17 minutes for this task. Level of fatigue at which pt entered may have played a role.  [name] had question about 6-step sequencing tasks- he could not find any online. SLP suggested he and pt think through some everyday tasks and write down 6 steps in them and SLP provided 7-8 examples.  01/03/24: Pt brought homework in completed. She enjoyed sequencing task. With simple problem solving tasks pt req'd rare min A, but performance was functional.  Her speech today was WNL rate, without anomia. When she has anomia, she uses synonym or description strategy. SLP observed pt to use description strategy in previous sessions. Homework provided.  01/01/24: Pt did not complete any SFA since last session. SLP told pt to complete when she has anomia with a noun. Pt brought homework with her completed. She finished 40 wrtiten definitions in approx 90 minutes. Today SLP targeted simple problem solving with 5-step sequencing task. Pt took almost 10 minutes to complete 4 sequences. She was provided with homework until next session.  12/28/23: Pt brought in books that daughter bought her. SLP to  look at these and star pages SLP thinks would be helpful for pt.  SLP worked with pt today with SFA - grill. She req'd mod A occasionally for procedure and for more accurate wording, especially description. SLP provided homework for description.   PATIENT EDUCATION: Education details: see treatment date Person educated: Patient and Spouse Education method: Medical illustrator Education comprehension: verbalized understanding and needs further education   GOALS: Goals reviewed with patient? Yes in general  SHORT TERM GOALS: Target date: 01/05/24  Pt will complete cognitive linguistic assessment in first 2 sessions  Baseline: Goal status: met  2.  Pt will successfully use anomia compensations with min A to do so, for 10 minutes functional simple conversation in 2 sessions Baseline:  Goal status: partially met  3.  Pt will demo how to use SFA with rare min A in 3 sessions Baseline: 01/03/24 Goal status: partially met  4.  Pt will demo [facility] problem solving skills in simple cognitive linguistic tasks in 2 sessions Baseline: 01/03/24 Goal status: partially met   LONG TERM GOALS: Target date:   05/10/24  Pt will improve PROM compared to initial administration Baseline:  Goal status: continue  2.  Pt will participate in 10 minutes mod complex conversation with functional expressive language (using anomia compensations) in 3 sessions Baseline: 03/13/24 Goal status: cont  3.  Pt will demo functional problem solving skills in practical cognitive linguistic tasks in 3 sessions Baseline:  Goal status: deferred, per pt   ASSESSMENT:  CLINICAL IMPRESSION: See ST Pprogress Note, above. Patient is a 73 y.o. F who was seen today for treatment of aphasia in light of bilateral frontal CVA, and parietal CVA. See treatment date above for today's date for further details on today's session. OT has d/c'd at this time, and pt states she is satisfied with cognition currently, so ST to FOCUS ON VERBAL EXPRESSION . Problem solving goals were d/c'd  in light of this. Pt with premorbid dx of ADHD and MCI.   OBJECTIVE IMPAIRMENTS: include attention, memory, awareness, and aphasia. These impairments are limiting patient from managing medications, managing appointments, managing finances, household responsibilities, ADLs/IADLs, and effectively communicating at home and in community. Factors affecting potential to achieve goals and functional outcome are ability to learn/carryover information and previous level of function. Patient will benefit from skilled SLP services to address above impairments and improve overall function.  REHAB POTENTIAL: Good  PLAN:  SLP FREQUENCY: 1x/week  SLP DURATION: 22 sessions total  PLANNED INTERVENTIONS: Language facilitation, Environmental controls, Cueing hierachy, Cognitive reorganization, Internal/external aids, Functional tasks, Multimodal communication approach, SLP instruction and feedback, Compensatory strategies, Patient/family education, [id number] Treatment of speech (30 or 45 min) , and [id number] (Standardized cognitive assessment)    Nakiea Metzner, CCC-SLP 03/19/2024, 2:07 PM

## 2024-03-25 ENCOUNTER — Ambulatory Visit (INDEPENDENT_AMBULATORY_CARE_PROVIDER_SITE_OTHER): Admitting: Podiatry

## 2024-03-25 ENCOUNTER — Ambulatory Visit (INDEPENDENT_AMBULATORY_CARE_PROVIDER_SITE_OTHER)

## 2024-03-25 ENCOUNTER — Ambulatory Visit

## 2024-03-25 DIAGNOSIS — I639 Cerebral infarction, unspecified: Secondary | ICD-10-CM

## 2024-03-25 DIAGNOSIS — M722 Plantar fascial fibromatosis: Secondary | ICD-10-CM

## 2024-03-25 LAB — CUP PACEART REMOTE DEVICE CHECK
Date Time Interrogation Session: 20250906232448
Implantable Pulse Generator Implant Date: 20250327

## 2024-03-25 MED ORDER — TRIAMCINOLONE ACETONIDE 10 MG/ML IJ SUSP
5.0000 mg | Freq: Once | INTRAMUSCULAR | Status: AC
  Administered 2024-03-25: 5 mg via INTRAMUSCULAR

## 2024-03-25 NOTE — Progress Notes (Signed)
 Subjective:   Patient ID: Gabrielle Vargas, female   DOB: 73 y.o.   MRN: [id number]   HPI Chief Complaint  Patient presents with   Foot Pain    Left heel pain for 2-3 weeks no recent injuries    73 year old female presents the office today for concerns of left heel pain for the last couple of weeks without any injuries.  She has pain in the morning when she first gets up if she sits and stands back up is when she gets discomfort.  She will get discomfort for being on her feet for some time as well.  No recent treatment.  No other concerns.   Review of Systems  All other systems reviewed and are negative.  Past Medical History:  Diagnosis Date   Acute combined systolic and diastolic heart failure 10/11/2023   ADHD (attention deficit hyperactivity disorder) 05/09/2007   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; multiple punctate foci of restricted diffusion in bilateral frontal lobes and the left parietal lobe   Diverticulitis of colon (without mention of hemorrhage) 09/17/2013   Essential hypertension 10/09/2017   HFrEF (heart failure with reduced ejection fraction) 10/10/2023   History of COVID-19 2021   Hyperlipidemia    Hypothyroidism    Low back pain radiating to right leg 10/09/2017   Mild cognitive impairment of uncertain or unknown etiology 08/04/2022   Mild intermittent asthma with acute exacerbation 05/30/2017   Mild neurocognitive disorder due to multiple etiologies 12/19/2023   NSVT (nonsustained ventricular tachycardia) 10/12/2023   Osteoarthritis    Postmenopausal atrophic vaginitis, severe 08/18/2008   Right knee pain 03/13/2014   S/P arthroscopy of right knee 06/03/2014   Vitamin B12 deficiency     Past Surgical History:  Procedure Laterality Date   ABDOMINAL HYSTERECTOMY     BREAST SURGERY     CARPAL TUNNEL RELEASE Bilateral    CERVICAL DISC ARTHROPLASTY     CESAREAN SECTION     x2   left shoulder surgery     LOOP RECORDER  INSERTION N/A 10/12/2023   Procedure: LOOP RECORDER INSERTION;  Surgeon: Cindie Ole DASEN, MD;  Location: [facility];  Service: Cardiovascular;  Laterality: N/A;   MENISCUS REPAIR Right 2017   REDUCTION MAMMAPLASTY Bilateral    right shoulder     TONSILLECTOMY       Current Outpatient Medications:    Acetylcysteine (NAC) 500 MG CAPS, Take 500 mg by mouth daily., Disp: , Rfl:    Ascorbic Acid (VITAMIN C) 1000 MG tablet, Take 1,000 mg by mouth daily., Disp: , Rfl:    aspirin  EC 81 MG tablet, Take 81 mg by mouth daily. Swallow whole., Disp: , Rfl:    atorvastatin  (LIPITOR) 40 MG tablet, Take 0.5 tablets (20 mg total) by mouth daily., Disp: 90 tablet, Rfl: 3   Bioflavonoid Products (QUERCETIN COMPLEX IMMUNE PO), Take 500 mg by mouth daily., Disp: , Rfl:    buPROPion  (WELLBUTRIN  XL) 150 MG 24 hr tablet, TAKE 1 TABLET BY MOUTH DAILY, Disp: 30 tablet, Rfl: 0   Cholecalciferol (D3-50 PO), Take 1 capsule by mouth daily., Disp: , Rfl:    Evening Primrose Oil 1000 MG CAPS, Take 1,000 mg by mouth in the morning and at bedtime., Disp: , Rfl:    ezetimibe  (ZETIA ) 10 MG tablet, Take 1 tablet (10 mg total) by mouth at bedtime., Disp: 90 tablet, Rfl: 3   ipratropium (ATROVENT ) 0.06 % nasal spray, Place 2 sprays into both nostrils 2 (  two) times daily as needed., Disp: 15 mL, Rfl: 12   metoprolol  succinate (TOPROL  XL) 25 MG 24 hr tablet, Take 0.5 tablets (12.5 mg total) by mouth daily., Disp: 45 tablet, Rfl: 3   SYNTHROID  75 MCG tablet, Take 1 tablet (75 mcg total) by mouth daily., Disp: 90 tablet, Rfl: 3   vitamin B-12 (CYANOCOBALAMIN ) 1000 MCG tablet, Take 1,000 mcg by mouth 2 (two) times a week., Disp: , Rfl:    Zinc 20 MG CAPS, Take 20 mg by mouth daily., Disp: , Rfl:   Allergies  Allergen Reactions   Ativan  [Lorazepam ]     Agitation/paradoxical reaction.   Oxycodone-Acetaminophen  Nausea Only   Latex Hives and Rash          Objective:  Physical Exam  General: AAO x3,  NAD  Dermatological: Skin is warm, dry and supple bilateral.  Vascular: Dorsalis Pedis artery and Posterior Tibial artery pedal pulses are 2/4 bilateral with immedate capillary fill time. There is no pain with calf compression, swelling, warmth, erythema.   Neruologic: Grossly intact via light touch bilateral.   Musculoskeletal: Tenderness to palpation along the plantar medial tubercle of the calcaneus at the insertion of plantar fascia on the left foot. There is no pain along the course of the plantar fascia within the arch of the foot. Plantar fascia appears to be intact. There is no pain with lateral compression of the calcaneus or pain with vibratory sensation. There is no pain along the course or insertion of the achilles tendon. No other areas of tenderness to bilateral lower extremities.   Assessment:   Left heel pain, plantar fasciitis     Plan:  -Treatment options discussed including all alternatives, risks, and complications -Etiology of symptoms were discussed -X-rays were obtained and reviewed with the patient.  3 views of the foot were obtained.  No evidence of acute fracture.  Calcaneal spurring is present. -We discussed steroid injection she wishes to proceed.  See procedure note below. -Will hold off on oral anti-inflammatories given recent stroke -Plantar fascial brace dispensed help support stabilize the plantar fascia -Discussed stretching, icing daily -Discussed supportive shoe gear. -I do think she open from custom orthotics given her pain.  Will submit authorization to [facility]  Procedure: Injection Tendon/Ligament Discussed alternatives, risks, complications and verbal consent was obtained.  Location: Left plantar fascia at the glabrous junction; medial approach. Skin Prep: Alcohol. Injectate: 0.5cc 0.5% marcaine plain, 0.5 cc 2% lidocaine  plain and, 1 cc kenalog  10. Disposition: Patient tolerated procedure well. Injection site dressed with a band-aid.   Post-injection care was discussed and return precautions discussed.   No follow-ups on file.  Donnice JONELLE Fees DPM

## 2024-03-25 NOTE — Patient Instructions (Signed)

## 2024-03-31 ENCOUNTER — Ambulatory Visit: Payer: Self-pay | Admitting: Cardiology

## 2024-04-01 NOTE — Progress Notes (Signed)
   04/01/2024  Patient ID: Gabrielle Vargas, female   DOB: 1951-03-23, 73 y.o.   MRN: [id number]  Pharmacy Quality Measure Review  This patient is appearing on a report for being at risk of failing the adherence measure for hypertension (ACEi/ARB) medications this calendar year.   Medication: Losartan  Last fill date: 11/15/23 for 90 day supply  Medication has been discontinued per chart review. No further action needed at this time.  Jon VEAR Lindau, PharmD Clinical Pharmacist [phone]

## 2024-04-04 NOTE — Progress Notes (Signed)
 Remote Loop Recorder Transmission

## 2024-04-05 ENCOUNTER — Encounter

## 2024-04-08 ENCOUNTER — Encounter: Payer: Self-pay | Admitting: Cardiology

## 2024-04-09 ENCOUNTER — Ambulatory Visit (INDEPENDENT_AMBULATORY_CARE_PROVIDER_SITE_OTHER): Admitting: Podiatry

## 2024-04-09 VITALS — Ht 59.0 in | Wt 109.1 lb

## 2024-04-09 DIAGNOSIS — M722 Plantar fascial fibromatosis: Secondary | ICD-10-CM | POA: Diagnosis not present

## 2024-04-09 NOTE — Progress Notes (Signed)
 Subjective:   Patient ID: Gabrielle Vargas, female   DOB: 73 y.o.   MRN: [id number]   HPI Chief Complaint  Patient presents with   Foot Pain    RM 11 Patient is here for plantar fasciitis of the left foot. Pt states minimum pain, using cold therapy and a brace.    73 year old female presents the office today for up evaluation left heel pain.  She is very happy the outcome and the injection was very helpful for her.  She not been having any pain although she has not returned back to her normal activity 100%.  Currently no pain.  Denies any recent injuries or changes otherwise.   Review of Systems  All other systems reviewed and are negative.    Objective:  Physical Exam  General: AAO x3, NAD  Dermatological: Skin is warm, dry and supple bilateral.  Vascular: Dorsalis Pedis artery and Posterior Tibial artery pedal pulses are 2/4 bilateral with immedate capillary fill time. There is no pain with calf compression, swelling, warmth, erythema.   Neruologic: Grossly intact via light touch bilateral.   Musculoskeletal: At this time there is no tenderness palpation on the course or insertion of the plantar fascia on the left side.  There is no pain with lateral compression of calcaneus.  No pain Achilles tendon.  No area pinpoint tenderness.  MMT 5/5.   Assessment:   Left heel pain, plantar fasciitis-much improved     Plan:  -Treatment options discussed including all alternatives, risks, and complications -Etiology of symptoms were discussed -While the symptoms have improved not have any pain discussed importance of continued stretching, ice antibiotic basis.  Did demonstrate stretching exercises today.  I do think that is important to do this before and after activity.  Continue shoes, good arch support as well as the plantar fascial brace.  Return if symptoms worsen or fail to improve.  Donnice JONELLE Fees DPM

## 2024-04-09 NOTE — Patient Instructions (Signed)

## 2024-04-10 ENCOUNTER — Other Ambulatory Visit: Payer: Self-pay | Admitting: Adult Health

## 2024-04-10 DIAGNOSIS — F909 Attention-deficit hyperactivity disorder, unspecified type: Secondary | ICD-10-CM

## 2024-04-12 ENCOUNTER — Emergency Department (HOSPITAL_BASED_OUTPATIENT_CLINIC_OR_DEPARTMENT_OTHER)
Admission: EM | Admit: 2024-04-12 | Discharge: 2024-04-12 | Discharge status: Against Medical Advice | Attending: Emergency Medicine | Admitting: Emergency Medicine

## 2024-04-12 ENCOUNTER — Ambulatory Visit

## 2024-04-12 ENCOUNTER — Ambulatory Visit: Payer: Self-pay | Admitting: *Deleted

## 2024-04-12 DIAGNOSIS — H00014 Hordeolum externum left upper eyelid: Secondary | ICD-10-CM | POA: Diagnosis not present

## 2024-04-12 DIAGNOSIS — Z5321 Procedure and treatment not carried out due to patient leaving prior to being seen by health care provider: Secondary | ICD-10-CM | POA: Insufficient documentation

## 2024-04-12 DIAGNOSIS — H02846 Edema of left eye, unspecified eyelid: Secondary | ICD-10-CM | POA: Insufficient documentation

## 2024-04-12 DIAGNOSIS — H5712 Ocular pain, left eye: Secondary | ICD-10-CM | POA: Diagnosis not present

## 2024-04-12 NOTE — Telephone Encounter (Signed)
 Copied from CRM [id number]. Topic: Clinical - Red Word Triage >> Apr 12, 2024  8:26 AM Suzen RAMAN wrote: Red Word that prompted transfer to Nurse Triage: swollen left eye; request an appt today with any available provider Reason for Disposition  MODERATE-SEVERE eyelid swelling on one side  (Exception: Due to a mosquito bite.)  Answer Assessment - Initial Assessment Questions 1. ONSET: When did the swelling start? (e.g., minutes, hours, days)     My left eye is swollen shut.   I put a warm compress on it.   This happened 2 yrs ago and I know it needs treatment.   I was given antibiotic eyedrops. 2. LOCATION: What part of the eyelids is swollen?     Left eye is swollen almost shut.   No drainage 3. SEVERITY: How swollen is it?     Almost shut 4. ITCHING: Is there any itching? If Yes, ask: How much?   (Scale 1-10; mild, moderate or severe)     A little itching but not bad 5. PAIN: Is the swelling painful to touch? If Yes, ask: How painful is it?   (Scale 1-10; mild, moderate or severe)     Red and burning 6. FEVER: Do you have a fever? If Yes, ask: What is it, how was it measured, and when did it start?      Not asked 7. CAUSE: What do you think is causing the swelling?     I had this before. 8. RECURRENT SYMPTOM: Have you had eyelid swelling before? If Yes, ask: When was the last time? What happened that time?     Es 2 yrs ago 9. OTHER SYMPTOMS: Do you have any other symptoms? (e.g., blurred vision, eye discharge, rash, runny nose)     No drainage  10. PREGNANCY: Is there any chance you are pregnant? When was your last menstrual period?       N/A  Protocols used: Eye - Swelling-A-AH FYI Only or Action Required?: FYI only for provider.  Patient was last seen in primary care on 03/12/2024 by Ozell Heron HERO, MD.  Called Nurse Triage reporting Eye Problem.Left eye swollen shut and red.  Symptoms began yesterday.  Interventions attempted: Ice/heat  application.  Symptoms are: gradually worsening.  Triage Disposition: See Physician Within 24 HoursNo appts available with any providers at several practices that I checked on.    Pt agreeable to going to the urgent care.  Unable to do video appt.   Does not have a computer.    Patient/caregiver understands and will follow disposition?: Yes

## 2024-04-12 NOTE — ED Triage Notes (Signed)
 Pt caox4 ambulatory NAD c/o redness and swelling to L upper eye lid that she noticed this morning when she woke up.

## 2024-04-12 NOTE — Progress Notes (Signed)
 Gabrielle Vargas is a 73 y.o.  with  reports that she has never smoked. She has never used smokeless tobacco. with  Active Ambulatory Problems    Diagnosis Date Noted  . Right knee pain 03/13/2014  . S/P arthroscopy of right knee 06/03/2014   Resolved Ambulatory Problems    Diagnosis Date Noted  . No Resolved Ambulatory Problems   Past Medical History:  Diagnosis Date  . Arthritis   . Asthma (CMD)   . Hyperlipidemia   . Hypertension   . Kidney stones   . Thyroid  disease    who presents today at Urgent Care for  Chief Complaint  Patient presents with  . Eye Pain    Patient states she woke up this morning with her left eye swollen shut.        History of Present Illness The patient is a 73 year old female who presents with her left eye. She states that it has been swelling up that started this morning.  The patient reports that her left eye was swollen shut this morning, but the swelling has since reduced. She has not been using the medication prescribed to her 2.5 years ago. She has been applying hot compresses to the affected eye, which she believes have helped alleviate the swelling.  Supplemental Information She had a stroke, which has affected her memory.      Patient has no co-morbidities  or medications that might increase the risk for developing a severe infection     reports that she has never smoked. She has never used smokeless tobacco.  Review of Systems  Review of systems is otherwise negative except as noted in the HPI and Assessment/MDM   Physical Exam  BP 146/79 (BP Location: Left arm, Patient Position: Sitting)   Pulse 51   Temp 97.7 F (36.5 C) (Oral)   Resp 18   Ht 1.499 m (4' 11)   Wt 49.9 kg (110 lb)   SpO2 100%   BMI 22.22 kg/m   Constitutional:      General: Patient is not in acute distress.    Appearance: Normal appearance.  Neurological:     General: No focal deficit present.     Mental Status: alert and oriented to  person, place, and time.  HENT:     Eyes:     Conjunctiva/sclera: Conjunctivae normal. Pharynx normal    There is no associated anterior cervical lymphadenopathy    Pupils: Pupils are equal, round, and reactive to light.     TM's normal with no visible effusion  Cardiovascular:     Heart sounds: Normal heart sounds. No murmur heard.    No Lower extremity edema noted Pulmonary:     Effort: Pulmonary effort is normal. No respiratory distress.     Breath sounds: No wheezing, rhonchi or rales.  Abdominal:     General: Bowel sounds are normal. There is no distension.     Tenderness: There is no abdominal tenderness.  Musculoskeletal:        General: Normal range of motion.     Neck:  No rigidity.  Skin:    General: Skin is warm and dry.  Psychiatric:        Mood and Affect: Mood normal.   No orders to display            DIAGNOSIS/PLAN     1. Hordeolum externum of left upper eyelid  erythromycin  (ROMYCIN) 5 mg/gram (0.5 %) ophthalmic ointment      Assessment &  Plan Initial Assessment: 73 year old female with left eye swelling starting this morning.   Differential Diagnosis: - Hordeolum: Blockage in eyelid glands. Erythromycin  ointment prescribed. Hot compresses recommended every 3-4 hours, not exceeding 30 minutes per application. Further evaluation if swelling persists.  ED Course: - Prescription for erythromycin  ointment sent to pharmacy.  Final Assessment: Prescription for erythromycin  ointment sent to pharmacy. Hot compresses recommended. Further evaluation if swelling persists.  Clinical Impression: - Hordeolum  Disposition: - Follow-Up: Further evaluation if swelling persists.  Patient Education: Hot compresses every 3-4 hours, not exceeding 30 minutes per application. Apply erythromycin  ointment across eyelid, avoid direct eye contact.  Portions of this note were created using the aid of voice recognition Dragon/DAX dictation software.   We  discussed risks and side effects of medications, and also discussed red flags which would warrant immediate follow-up.   Urgent Care Disposition:  Home Care

## 2024-04-12 NOTE — Telephone Encounter (Signed)
 Pt is otw to urgent care.

## 2024-04-13 NOTE — Progress Notes (Signed)
 Remote Loop Recorder Transmission

## 2024-04-17 ENCOUNTER — Ambulatory Visit: Attending: Physical Medicine & Rehabilitation

## 2024-04-17 DIAGNOSIS — R4701 Aphasia: Secondary | ICD-10-CM | POA: Insufficient documentation

## 2024-04-17 DIAGNOSIS — R41841 Cognitive communication deficit: Secondary | ICD-10-CM | POA: Diagnosis not present

## 2024-04-17 NOTE — Therapy (Signed)
 OUTPATIENT SPEECH LANGUAGE PATHOLOGY TREATMENT   [name] MRN: [id number] DOB:Oct 31, 1950, 73 y.o., female Today's Date: 04/17/2024  PCP: Merna Huxley, NP REFERRING PROVIDER: Carilyn Prentice BRAVO, MD  END OF SESSION:  End of Session - 04/17/24 1451     Visit Number 19    Number of Visits 22    Date for Recertification  05/10/24   pt to begin x1/week x4 weeks the week of 04/15/24   SLP Start Time 1104    SLP Stop Time  1145    SLP Time Calculation (min) 41 min    Activity Tolerance Patient tolerated treatment well                      Past Medical History:  Diagnosis Date   Acute combined systolic and diastolic heart failure 10/11/2023   ADHD (attention deficit hyperactivity disorder) 05/09/2007   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; multiple punctate foci of restricted diffusion in bilateral frontal lobes and the left parietal lobe   Diverticulitis of colon (without mention of hemorrhage) 09/17/2013   Essential hypertension 10/09/2017   HFrEF (heart failure with reduced ejection fraction) 10/10/2023   History of COVID-19 2021   Hyperlipidemia    Hypothyroidism    Low back pain radiating to right leg 10/09/2017   Mild cognitive impairment of uncertain or unknown etiology 08/04/2022   Mild intermittent asthma with acute exacerbation 05/30/2017   Mild neurocognitive disorder due to multiple etiologies 12/19/2023   NSVT (nonsustained ventricular tachycardia) 10/12/2023   Osteoarthritis    Postmenopausal atrophic vaginitis, severe 08/18/2008   Right knee pain 03/13/2014   S/P arthroscopy of right knee 06/03/2014   Vitamin B12 deficiency    Past Surgical History:  Procedure Laterality Date   ABDOMINAL HYSTERECTOMY     BREAST SURGERY     CARPAL TUNNEL RELEASE Bilateral    CERVICAL DISC ARTHROPLASTY     CESAREAN SECTION     x2   left shoulder surgery     LOOP RECORDER INSERTION N/A 10/12/2023   Procedure:  LOOP RECORDER INSERTION;  Surgeon: Cindie Ole DASEN, MD;  Location: [facility];  Service: Cardiovascular;  Laterality: N/A;   MENISCUS REPAIR Right 2017   REDUCTION MAMMAPLASTY Bilateral    right shoulder     TONSILLECTOMY     Patient Active Problem List   Diagnosis Date Noted   Mild neurocognitive disorder due to multiple etiologies 12/19/2023   NSVT (nonsustained ventricular tachycardia) 10/12/2023   Acute combined systolic and diastolic heart failure 10/11/2023   HFrEF (heart failure with reduced ejection fraction) 10/10/2023   CVA (cerebral vascular accident) 10/08/2023   Mild cognitive impairment of uncertain or unknown etiology 08/04/2022   Vitamin B12 deficiency    Osteoarthritis 08/03/2022   Hyperlipidemia 08/03/2022   Essential hypertension 10/09/2017   Low back pain radiating to right leg 10/09/2017   Mild intermittent asthma with acute exacerbation 05/30/2017   S/P arthroscopy of right knee 06/03/2014   Right knee pain 03/13/2014   Diverticulitis of colon (without mention of hemorrhage) 09/17/2013   Postmenopausal atrophic vaginitis, severe 08/18/2008   Hypothyroidism 05/09/2007   ADHD (attention deficit hyperactivity disorder) 05/09/2007     ONSET DATE:  10/08/23  REFERRING DIAG: P30.679 (ICD-10-CM) - Aphasia as late effect of stroke  THERAPY DIAG:  No diagnosis found.  Rationale for Evaluation and Treatment: Rehabilitation  SUBJECTIVE:   SUBJECTIVE STATEMENT: He did help, but he didn't give me the answers.  Pt accompanied by: self   PERTINENT HISTORY: HPI: Pt presented to [facility] ED on 10/08/2023 with sudden onset of left facial droop and left-sided weakness. ADHD, HTN, diverticulitis, HLD, hypothyroidism, low back pain, neck fusion, mild cognitive impairment (dx Jan 2024), cervical disc arthroplasty, OA in R knee, asthma   PAIN:  Are you having pain? No  FALLS: Has patient fallen in last 6 months?  No  PATIENT GOALS: Improve language  function, memory  OBJECTIVE:  Note: Objective measures were completed at Evaluation unless otherwise noted.  DIAGNOSTIC FINDINGS:  MRI 10/09/23 IMPRESSION: 1. Findings compatible with multiple punctate acute infarcts in bilateral frontal and left parietal lobes. Given involvement of multiple vascular territories, consider an embolic etiology. 2. Significantly motion limited study.   Date of Discharge from SLP service:October 19, 2023 Clinical Impression/Discharge Summary: Pt has made great gains and has met 4 of 6 LTG's this admission due to improved cognition and language. Pt is currently an overall mod I A for orientation tasks, though requires up to [facility] for attention. Patient requires mod I A for expressive language, though continues to require occasional min-modA for complex receptive language tasks.  Pt/family education complete and pt will discharge home with 24 hour supervision from friends/family/etc. Pt would benefit from OP f/u ST services to maximize cognition and communication in order to maximize functional independence.     STANDARDIZED ASSESSMENTS:   Cognitive Linguistic Quick Test  AGE - 70-89  The Cognitive Linguistic Quick Test (CLQT) was administered to assess the relative status of five cognitive domains: attention, memory, language, executive functioning, and visuospatial skills. Scores from 10 tasks were used to estimate severity ratings (standardized for age groups 18-69 years and 70-89 years) for each domain, a clock drawing task, as well as an overall composite severity rating of cognition.     Task Score Criterion Cut Scores  Personal Facts 8/8 8  Symbol Cancellation 12/12 10  Confrontation Naming 10/10 10  Clock Drawing  12/13 11  Story Retelling 8/10 5  Symbol Trails 10/10 6  Generative Naming 3/9 4  Design Memory 4/6 4  Mazes  4/8 4  Design Generation 3/13 5   Cognitive Domain Composite Score Severity Rating  Attention 181/215 WNL  Memory 147/185 WNL   Executive Function 20/40 Low-WNL  Language 29/37 Low-WNL  Visuospatial Skills 75/105 WNL  Clock Drawing  12/13 WNL  Composite Severity Rating  WNL     PATIENT REPORTED OUTCOME MEASURES (PROM): Communication Effectiveness Survey: 12/20/23 - pt scored herself 18/32, with higher scores indicating less of a negative effect of pt's deficits on daily life and QOL.                                                                                                                              TREATMENT DATE:  Semantic Feature Analysis (SFA)  04/17/24: SLP discussed with pt and husband plan for pt after d/c 05/07/24. SLP attempted to assist pt and  husband find an aphasia workbook but unsuccessful. Since it has been 30 days since pt has been seen, SLP asked pt and husband about pt's status. [name] stated pt's word finding appears slightly better but no drastic improvement over the interim since previous appointment. Pt's conversation today was WNL given one anomic instance which req'd 6 seconds extra time and pt generated her target word.   03/19/24: [name] states pt still has difficulty with staying on task, occasionally. Pt and husband going on short vacation next week and to target anomia, pt generated list of what she will take on the trip. Anomia occurred x4, pt with SLP cues to describe x2 (successful for pt to self-generate the word) and she spontaneously described the item with success the last time. One instance (crypt) pt unable to generate correct word given SLP letter and semantic cues. Extra time consistently necessary. She req'd min A back to task due to self-distracting comments. ST to cont to focus on word finding.   03/13/24: SLP targeted cognitive word finding with pt today and assisted her with dual/triple meaning words. She req'd consistent cues for a third definition, verbal fluency was WNL for this task. Pt speed of response in conversation and in therapy tasks was quicker today than  03/11/24. Discussion about ST frequency after next week - pt with one appointment left. Pt, husband and SLP decided once/week for 4 weeks is best. Pt to scheudle more ST (after next week) in four weeks due to SLP current schedule. SLP spent much of the remainder of the session educating pt and husband what she could do in that interim.   03/11/24: Pt was taken off Wellbutrin  03/05/24 and starting approx 03/07/24 exhibited reduced attention and reduced ability to communicate verbally. Pt agreed with SLP when SLP asked her if she felt scattered. Pt also said she feels more withdrawn than she has previosly.  SLP reviewed pt's homework - [name] assisted with her homework Pt's verbal expression was not remarkably different than previous sessions except she appeared to have slightly more difficulty with verbal expression in specific conversation about daughter's artwork. In three instances today In 2/3 instances today, the SLP asked the patient direct questions to prompt use of more specific words. During this time of mod SLP cues, the patient used the target word in a sentence in 10-30 seconds and was unaware of her success.  03/05/24: SLP reviewed pt's homework with her - req'd mod-max A consistently for words associated with a given concept. SLP modeled cueing methods for [name] in order for him to assist pt at home with this task/these anomia tasks. SLP educated pt/husband that it appears her premorbid cognitive differences are making it more challenging for her to experience more success - SLP does not usually need to provide this level of cueing on these lower-level tasks for pts post CVA. Pt may not return to baseline with her deficits presenting the way they are at this point post CVA.  02/28/24: Pt reports no frustrations or problems as of now with cognition currently and she is satisfied to focus on anomia in ST beginning next session. SLP suggested pt keep track of her own homework in a manner she desires.  SLP assisted pt with deciding she could do this with her clipboard, and provide homework to SLP at beginning of sessions. SLP reviewed pt's homework with her. Pt unaware of 2 details on detailed written instructions. SLP gave consistent mod A for [place] . Homework provided for word  finding.  02/26/24:  SLP assisted pt with error awareness and reviewed homework with her - pt req'd min-mod A for awareness with this task (map). Her groceries task demonstrated min decr'd organization, req'd mod A for error awareness, and she req'd mod semantic cues during this task to generate dairy due to anomia. SLP encouraged pt and [name] to cont SFA at home PRN. They will complete smoke alarm with SFA.   02/19/24: SLP assisted pt in problem solving with [employer] given the information she found. Initially she felt like she could keep up but SLP talked further with pt about prophecy books and she decided this was not best for her to participate. Pt had difficulty arriving at a way she could participate but not necessarily do al lthe work necessary and SLP helped pt realize other options (problem solve). Pt is  now on Wellbutrin , which she and [name] will assess if attention is improved or same with this med compared to Adderall.    PATIENT EDUCATION: Education details: see treatment date Person educated: Patient and Spouse Education method: Medical illustrator Education comprehension: verbalized understanding and needs further education   GOALS: Goals reviewed with patient? Yes in general  SHORT TERM GOALS: Target date: 01/05/24  Pt will complete cognitive linguistic assessment in first 2 sessions  Baseline: Goal status: met  2.  Pt will successfully use anomia compensations with min A to do so, for 10 minutes functional simple conversation in 2 sessions Baseline:  Goal status: partially met  3.  Pt will demo how to use SFA with rare min A in 3  sessions Baseline: 01/03/24 Goal status: partially met  4.  Pt will demo [facility] problem solving skills in simple cognitive linguistic tasks in 2 sessions Baseline: 01/03/24 Goal status: partially met   LONG TERM GOALS: Target date:   05/10/24  Pt will improve PROM compared to initial administration Baseline:  Goal status: continue  2.  Pt will participate in 10 minutes mod complex conversation with functional expressive language (using anomia compensations) in 3 sessions Baseline: 03/13/24, 04/17/24 Goal status: cont  3.  Pt will demo functional problem solving skills in practical cognitive linguistic tasks in 3 sessions Baseline:  Goal status: deferred, per pt   ASSESSMENT:  CLINICAL IMPRESSION: Pt status remains primarily unchanged from last session 03/19/24. Patient is a 73 y.o. F who was seen today for treatment of aphasia in light of bilateral frontal CVA, and parietal CVA. See treatment date above for today's date for further details on today's session. ST to cont to FOCUS ON VERBAL EXPRESSION , with at least 2 more sessions. SLP to cont to attempt to find aphasia workbook for pt to use x2/week x30 minutes after d/c from skilled ST. Problem solving goals were d/c'd in light of this. Pt with premorbid dx of ADHD and MCI.   OBJECTIVE IMPAIRMENTS: include attention, memory, awareness, and aphasia. These impairments are limiting patient from managing medications, managing appointments, managing finances, household responsibilities, ADLs/IADLs, and effectively communicating at home and in community. Factors affecting potential to achieve goals and functional outcome are ability to learn/carryover information and previous level of function. Patient will benefit from skilled SLP services to address above impairments and improve overall function.  REHAB POTENTIAL: Good  PLAN:  SLP FREQUENCY: 1x/week  SLP DURATION: 22 sessions total  PLANNED INTERVENTIONS: Language facilitation,  Environmental controls, Cueing hierachy, Cognitive reorganization, Internal/external aids, Functional tasks, Multimodal communication approach, SLP instruction and feedback, Compensatory strategies, Patient/family education, [phone]  Treatment of speech (30 or 45 min) , and [id number] (Standardized cognitive assessment)    Jathniel Smeltzer, CCC-SLP 04/17/2024, 2:51 PM

## 2024-04-19 ENCOUNTER — Ambulatory Visit
Admission: RE | Admit: 2024-04-19 | Discharge: 2024-04-19 | Disposition: A | Discharge status: Home / Self Care | Source: Ambulatory Visit | Attending: Adult Health | Admitting: Adult Health

## 2024-04-19 DIAGNOSIS — Z1231 Encounter for screening mammogram for malignant neoplasm of breast: Secondary | ICD-10-CM | POA: Diagnosis not present

## 2024-04-23 ENCOUNTER — Ambulatory Visit

## 2024-04-23 DIAGNOSIS — R4701 Aphasia: Secondary | ICD-10-CM | POA: Diagnosis not present

## 2024-04-23 DIAGNOSIS — R41841 Cognitive communication deficit: Secondary | ICD-10-CM

## 2024-04-23 NOTE — Therapy (Signed)
 OUTPATIENT SPEECH LANGUAGE PATHOLOGY TREATMENT   [name] MRN: [id number] DOB:June 05, 1951, 73 y.o., female Today's Date: 04/23/2024  PCP: Merna Huxley, NP REFERRING PROVIDER: Carilyn Prentice BRAVO, MD  END OF SESSION:  End of Session - 04/23/24 1419     Visit Number 20    Number of Visits 22    Date for Recertification  05/10/24    SLP Start Time 1403    SLP Stop Time  1445    SLP Time Calculation (min) 42 min    Activity Tolerance Patient tolerated treatment well                      Past Medical History:  Diagnosis Date   Acute combined systolic and diastolic heart failure 10/11/2023   ADHD (attention deficit hyperactivity disorder) 05/09/2007   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; multiple punctate foci of restricted diffusion in bilateral frontal lobes and the left parietal lobe   Diverticulitis of colon (without mention of hemorrhage) 09/17/2013   Essential hypertension 10/09/2017   HFrEF (heart failure with reduced ejection fraction) 10/10/2023   History of COVID-19 2021   Hyperlipidemia    Hypothyroidism    Low back pain radiating to right leg 10/09/2017   Mild cognitive impairment of uncertain or unknown etiology 08/04/2022   Mild intermittent asthma with acute exacerbation 05/30/2017   Mild neurocognitive disorder due to multiple etiologies 12/19/2023   NSVT (nonsustained ventricular tachycardia) 10/12/2023   Osteoarthritis    Postmenopausal atrophic vaginitis, severe 08/18/2008   Right knee pain 03/13/2014   S/P arthroscopy of right knee 06/03/2014   Vitamin B12 deficiency    Past Surgical History:  Procedure Laterality Date   ABDOMINAL HYSTERECTOMY     BREAST SURGERY     CARPAL TUNNEL RELEASE Bilateral    CERVICAL DISC ARTHROPLASTY     CESAREAN SECTION     x2   left shoulder surgery     LOOP RECORDER INSERTION N/A 10/12/2023   Procedure: LOOP RECORDER INSERTION;  Surgeon: Cindie Ole DASEN, MD;  Location: [facility];  Service: Cardiovascular;  Laterality: N/A;   MENISCUS REPAIR Right 2017   REDUCTION MAMMAPLASTY Bilateral    right shoulder     TONSILLECTOMY     Patient Active Problem List   Diagnosis Date Noted   Mild neurocognitive disorder due to multiple etiologies 12/19/2023   NSVT (nonsustained ventricular tachycardia) 10/12/2023   Acute combined systolic and diastolic heart failure 10/11/2023   HFrEF (heart failure with reduced ejection fraction) 10/10/2023   CVA (cerebral vascular accident) 10/08/2023   Mild cognitive impairment of uncertain or unknown etiology 08/04/2022   Vitamin B12 deficiency    Osteoarthritis 08/03/2022   Hyperlipidemia 08/03/2022   Essential hypertension 10/09/2017   Low back pain radiating to right leg 10/09/2017   Mild intermittent asthma with acute exacerbation 05/30/2017   S/P arthroscopy of right knee 06/03/2014   Right knee pain 03/13/2014   Diverticulitis of colon (without mention of hemorrhage) 09/17/2013   Postmenopausal atrophic vaginitis, severe 08/18/2008   Hypothyroidism 05/09/2007   ADHD (attention deficit hyperactivity disorder) 05/09/2007   Speech Therapy Progress Note  Dates of Reporting Period: 02/13/24 to present  Subjective Statement: Pt seen for 10 more sessions of ST focusing on cog-comm and verbal expression, changing to 100% verbal expression during this last 10 sessions.  Objective: See below  Goal Update: See below  Plan: pt will be seen for 1-2 more sessions to  ensure pt and husband's comfort with pt's completion of language tasks post-d/c.  Reason Skilled Services are Required: see plan above.    ONSET DATE:  10/08/23  REFERRING DIAG: P30.679 (ICD-10-CM) - Aphasia as late effect of stroke  THERAPY DIAG:  Cognitive communication deficit  Aphasia  Rationale for Evaluation and Treatment: Rehabilitation  SUBJECTIVE:   SUBJECTIVE STATEMENT: Probably three. (Pt, amount of 30 minute  intervals she performed language tasks.)     Pt accompanied by: significant other - George   PERTINENT HISTORY: HPI: Pt presented to [facility] ED on 10/08/2023 with sudden onset of left facial droop and left-sided weakness. ADHD, HTN, diverticulitis, HLD, hypothyroidism, low back pain, neck fusion, mild cognitive impairment (dx Jan 2024), cervical disc arthroplasty, OA in R knee, asthma   PAIN:  Are you having pain? No  FALLS: Has patient fallen in last 6 months?  No  PATIENT GOALS: Improve language function, memory  OBJECTIVE:  Note: Objective measures were completed at Evaluation unless otherwise noted.  DIAGNOSTIC FINDINGS:  MRI 10/09/23 IMPRESSION: 1. Findings compatible with multiple punctate acute infarcts in bilateral frontal and left parietal lobes. Given involvement of multiple vascular territories, consider an embolic etiology. 2. Significantly motion limited study.   Date of Discharge from SLP service:October 19, 2023 Clinical Impression/Discharge Summary: Pt has made great gains and has met 4 of 6 LTG's this admission due to improved cognition and language. Pt is currently an overall mod I A for orientation tasks, though requires up to [facility] for attention. Patient requires mod I A for expressive language, though continues to require occasional min-modA for complex receptive language tasks.  Pt/family education complete and pt will discharge home with 24 hour supervision from friends/family/etc. Pt would benefit from OP f/u ST services to maximize cognition and communication in order to maximize functional independence.     STANDARDIZED ASSESSMENTS:   Cognitive Linguistic Quick Test  AGE - 70-89  The Cognitive Linguistic Quick Test (CLQT) was administered to assess the relative status of five cognitive domains: attention, memory, language, executive functioning, and visuospatial skills. Scores from 10 tasks were used to estimate severity ratings (standardized for age groups 18-69 years  and 70-89 years) for each domain, a clock drawing task, as well as an overall composite severity rating of cognition.     Task Score Criterion Cut Scores  Personal Facts 8/8 8  Symbol Cancellation 12/12 10  Confrontation Naming 10/10 10  Clock Drawing  12/13 11  Story Retelling 8/10 5  Symbol Trails 10/10 6  Generative Naming 3/9 4  Design Memory 4/6 4  Mazes  4/8 4  Design Generation 3/13 5   Cognitive Domain Composite Score Severity Rating  Attention 181/215 WNL  Memory 147/185 WNL  Executive Function 20/40 Low-WNL  Language 29/37 Low-WNL  Visuospatial Skills 75/105 WNL  Clock Drawing  12/13 WNL  Composite Severity Rating  WNL     PATIENT REPORTED OUTCOME MEASURES (PROM): Communication Effectiveness Survey: 12/20/23 - pt scored herself 18/32, with higher scores indicating less of a negative effect of pt's deficits on daily life and QOL.  TREATMENT DATE:  Semantic Feature Analysis (SFA)  04/23/24: Pt cont to perform 30 minutes of language tasks a few times a week. She arrives this afternoon with homework 30% complete. Pt described some family difficulties at this time and so SLP encouraged pt to cont to complete 30 minutes of language as she is able. She will bake/cook something from a recipe she hasn't done in a long time in the next week. Next session will be pt/husband/SLP talking to ensure pt and husband know activities pt can do after d/c and other questions If necessary one more visit to discuss these things 05/07/24, or possible d/c next session.  04/17/24: SLP discussed with pt and husband plan for pt after d/c 05/07/24. SLP attempted to assist pt and husband find an aphasia workbook but unsuccessful. Since it has been 30 days since pt has been seen, SLP asked pt and husband about pt's status. [name] stated pt's word finding appears slightly better but no  drastic improvement over the interim since previous appointment. Pt's conversation today was WNL given one anomic instance which req'd 6 seconds extra time and pt generated her target word.   03/19/24: [name] states pt still has difficulty with staying on task, occasionally. Pt and husband going on short vacation next week and to target anomia, pt generated list of what she will take on the trip. Anomia occurred x4, pt with SLP cues to describe x2 (successful for pt to self-generate the word) and she spontaneously described the item with success the last time. One instance (crypt) pt unable to generate correct word given SLP letter and semantic cues. Extra time consistently necessary. She req'd min A back to task due to self-distracting comments. ST to cont to focus on word finding.   03/13/24: SLP targeted cognitive word finding with pt today and assisted her with dual/triple meaning words. She req'd consistent cues for a third definition, verbal fluency was WNL for this task. Pt speed of response in conversation and in therapy tasks was quicker today than 03/11/24. Discussion about ST frequency after next week - pt with one appointment left. Pt, husband and SLP decided once/week for 4 weeks is best. Pt to scheudle more ST (after next week) in four weeks due to SLP current schedule. SLP spent much of the remainder of the session educating pt and husband what she could do in that interim.   03/11/24: Pt was taken off Wellbutrin  03/05/24 and starting approx 03/07/24 exhibited reduced attention and reduced ability to communicate verbally. Pt agreed with SLP when SLP asked her if she felt scattered. Pt also said she feels more withdrawn than she has previosly.  SLP reviewed pt's homework - [name] assisted with her homework Pt's verbal expression was not remarkably different than previous sessions except she appeared to have slightly more difficulty with verbal expression in specific conversation about daughter's  artwork. In three instances today In 2/3 instances today, the SLP asked the patient direct questions to prompt use of more specific words. During this time of mod SLP cues, the patient used the target word in a sentence in 10-30 seconds and was unaware of her success.  03/05/24: SLP reviewed pt's homework with her - req'd mod-max A consistently for words associated with a given concept. SLP modeled cueing methods for [name] in order for him to assist pt at home with this task/these anomia tasks. SLP educated pt/husband that it appears her premorbid cognitive differences are making it more challenging for her to experience more success -  SLP does not usually need to provide this level of cueing on these lower-level tasks for pts post CVA. Pt may not return to baseline with her deficits presenting the way they are at this point post CVA.  02/28/24: Pt reports no frustrations or problems as of now with cognition currently and she is satisfied to focus on anomia in ST beginning next session. SLP suggested pt keep track of her own homework in a manner she desires. SLP assisted pt with deciding she could do this with her clipboard, and provide homework to SLP at beginning of sessions. SLP reviewed pt's homework with her. Pt unaware of 2 details on detailed written instructions. SLP gave consistent mod A for [place] . Homework provided for word finding.  02/26/24:  SLP assisted pt with error awareness and reviewed homework with her - pt req'd min-mod A for awareness with this task (map). Her groceries task demonstrated min decr'd organization, req'd mod A for error awareness, and she req'd mod semantic cues during this task to generate dairy due to anomia. SLP encouraged pt and [name] to cont SFA at home PRN. They will complete smoke alarm with SFA.   02/19/24: SLP assisted pt in problem solving with [employer] given the information she found. Initially she felt like she could keep up  but SLP talked further with pt about prophecy books and she decided this was not best for her to participate. Pt had difficulty arriving at a way she could participate but not necessarily do al lthe work necessary and SLP helped pt realize other options (problem solve). Pt is  now on Wellbutrin , which she and [name] will assess if attention is improved or same with this med compared to Adderall.    PATIENT EDUCATION: Education details: see treatment date Person educated: Patient and Spouse Education method: Medical illustrator Education comprehension: verbalized understanding and needs further education   GOALS: Goals reviewed with patient? Yes in general  SHORT TERM GOALS: Target date: 01/05/24  Pt will complete cognitive linguistic assessment in first 2 sessions  Baseline: Goal status: met  2.  Pt will successfully use anomia compensations with min A to do so, for 10 minutes functional simple conversation in 2 sessions Baseline:  Goal status: partially met  3.  Pt will demo how to use SFA with rare min A in 3 sessions Baseline: 01/03/24 Goal status: partially met  4.  Pt will demo [facility] problem solving skills in simple cognitive linguistic tasks in 2 sessions Baseline: 01/03/24 Goal status: partially met   LONG TERM GOALS: Target date:   05/10/24  Pt will improve PROM compared to initial administration Baseline:  Goal status: continue  2.  Pt will participate in 10 minutes mod complex conversation with functional expressive language (using anomia compensations) in 3 sessions Baseline: 03/13/24, 04/17/24 Goal status: cont  3.  Pt will demo functional problem solving skills in practical cognitive linguistic tasks in 3 sessions Baseline:  Goal status: deferred, per pt   ASSESSMENT:  CLINICAL IMPRESSION: Pt status remains primarily unchanged from last session. Patient is a 73 y.o. F who was seen today for treatment of aphasia in light of bilateral frontal CVA, and  parietal CVA. See treatment date above for today's date for further details on today's session. Problem solving goals were d/c'd in light of this. Pt with premorbid dx of ADHD and MCI.   OBJECTIVE IMPAIRMENTS: include attention, memory, awareness, and aphasia. These impairments are limiting patient from managing medications,  managing appointments, managing finances, household responsibilities, ADLs/IADLs, and effectively communicating at home and in community. Factors affecting potential to achieve goals and functional outcome are ability to learn/carryover information and previous level of function. Patient will benefit from skilled SLP services to address above impairments and improve overall function.  REHAB POTENTIAL: Good  PLAN:  SLP FREQUENCY: 1x/week  SLP DURATION: 22 sessions total  PLANNED INTERVENTIONS: Language facilitation, Environmental controls, Cueing hierachy, Cognitive reorganization, Internal/external aids, Functional tasks, Multimodal communication approach, SLP instruction and feedback, Compensatory strategies, Patient/family education, [phone] Treatment of speech (30 or 45 min) , and [id number] (Standardized cognitive assessment)    Jedrek Dinovo, CCC-SLP 04/23/2024, 2:24 PM

## 2024-04-25 ENCOUNTER — Encounter

## 2024-04-26 ENCOUNTER — Ambulatory Visit (INDEPENDENT_AMBULATORY_CARE_PROVIDER_SITE_OTHER)

## 2024-04-26 DIAGNOSIS — I639 Cerebral infarction, unspecified: Secondary | ICD-10-CM | POA: Diagnosis not present

## 2024-04-26 LAB — CUP PACEART REMOTE DEVICE CHECK
Date Time Interrogation Session: 20251009234853
Implantable Pulse Generator Implant Date: 20250327

## 2024-04-26 NOTE — Progress Notes (Signed)
 Remote Loop Recorder Transmission

## 2024-04-29 ENCOUNTER — Ambulatory Visit: Payer: Self-pay | Admitting: Cardiology

## 2024-04-29 NOTE — Progress Notes (Signed)
 Remote Loop Recorder Transmission

## 2024-04-30 ENCOUNTER — Ambulatory Visit

## 2024-04-30 DIAGNOSIS — R4701 Aphasia: Secondary | ICD-10-CM

## 2024-04-30 DIAGNOSIS — R41841 Cognitive communication deficit: Secondary | ICD-10-CM

## 2024-04-30 NOTE — Therapy (Signed)
 OUTPATIENT SPEECH LANGUAGE PATHOLOGY TREATMENT/DISCHARGE   [name] MRN: [id number] DOB:18-May-1951, 73 y.o., female Today's Date: 04/30/2024  PCP: Merna Huxley, NP REFERRING PROVIDER: Carilyn Prentice BRAVO, MD  END OF SESSION:  End of Session - 04/30/24 1443     Visit Number 21    Number of Visits 22    Date for Recertification  05/10/24    SLP Start Time 1404    SLP Stop Time  1445    SLP Time Calculation (min) 41 min    Activity Tolerance Patient tolerated treatment well                      Past Medical History:  Diagnosis Date   Acute combined systolic and diastolic heart failure 10/11/2023   ADHD (attention deficit hyperactivity disorder) 05/09/2007   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; multiple punctate foci of restricted diffusion in bilateral frontal lobes and the left parietal lobe   Diverticulitis of colon (without mention of hemorrhage) 09/17/2013   Essential hypertension 10/09/2017   HFrEF (heart failure with reduced ejection fraction) 10/10/2023   History of COVID-19 2021   Hyperlipidemia    Hypothyroidism    Low back pain radiating to right leg 10/09/2017   Mild cognitive impairment of uncertain or unknown etiology 08/04/2022   Mild intermittent asthma with acute exacerbation 05/30/2017   Mild neurocognitive disorder due to multiple etiologies 12/19/2023   NSVT (nonsustained ventricular tachycardia) 10/12/2023   Osteoarthritis    Postmenopausal atrophic vaginitis, severe 08/18/2008   Right knee pain 03/13/2014   S/P arthroscopy of right knee 06/03/2014   Vitamin B12 deficiency    Past Surgical History:  Procedure Laterality Date   ABDOMINAL HYSTERECTOMY     BREAST SURGERY     CARPAL TUNNEL RELEASE Bilateral    CERVICAL DISC ARTHROPLASTY     CESAREAN SECTION     x2   left shoulder surgery     LOOP RECORDER INSERTION N/A 10/12/2023   Procedure: LOOP RECORDER INSERTION;  Surgeon:  Cindie Ole DASEN, MD;  Location: [facility];  Service: Cardiovascular;  Laterality: N/A;   MENISCUS REPAIR Right 2017   REDUCTION MAMMAPLASTY Bilateral    right shoulder     TONSILLECTOMY     Patient Active Problem List   Diagnosis Date Noted   Mild neurocognitive disorder due to multiple etiologies 12/19/2023   NSVT (nonsustained ventricular tachycardia) 10/12/2023   Acute combined systolic and diastolic heart failure 10/11/2023   HFrEF (heart failure with reduced ejection fraction) 10/10/2023   CVA (cerebral vascular accident) 10/08/2023   Mild cognitive impairment of uncertain or unknown etiology 08/04/2022   Vitamin B12 deficiency    Osteoarthritis 08/03/2022   Hyperlipidemia 08/03/2022   Essential hypertension 10/09/2017   Low back pain radiating to right leg 10/09/2017   Mild intermittent asthma with acute exacerbation 05/30/2017   S/P arthroscopy of right knee 06/03/2014   Right knee pain 03/13/2014   Diverticulitis of colon (without mention of hemorrhage) 09/17/2013   Postmenopausal atrophic vaginitis, severe 08/18/2008   Hypothyroidism 05/09/2007   ADHD (attention deficit hyperactivity disorder) 05/09/2007     SPEECH THERAPY DISCHARGE SUMMARY  Visits from Start of Care: 21  Current functional level related to goals / functional outcomes: See below. Pt has improved cognition and aphasia   Remaining deficits: Mild aphasia, mild cog-comm deficts. Pt with premorbid dx of ADHD.   Education / Equipment: See individual therapy session notes.   Patient  agrees to discharge. Patient goals were partially met. Patient is being discharged due to being pleased with the current functional level..     ONSET DATE:  10/08/23  REFERRING DIAG: P30.679 (ICD-10-CM) - Aphasia as late effect of stroke  THERAPY DIAG:  Cognitive communication deficit  Aphasia  Rationale for Evaluation and Treatment: Rehabilitation  SUBJECTIVE:   SUBJECTIVE STATEMENT: Do we want to  do next week? [name])    Pt accompanied by: significant other [name]   PERTINENT HISTORY: HPI: Pt presented [facility] ED on 10/08/2023 with sudden onset of left facial droop and left-sided weakness. ADHD, HTN, diverticulitis, HLD, hypothyroidism, low back pain, neck fusion, mild cognitive impairment (dx Jan 2024), cervical disc arthroplasty, OA in R knee, asthma   PAIN:  Are you having pain? No  FALLS: Has patient fallen in last 6 months?  No  PATIENT GOALS: Improve language function, memory  OBJECTIVE:  Note: Objective measures were completed at Evaluation unless otherwise noted.  DIAGNOSTIC FINDINGS:  MRI 10/09/23 IMPRESSION: 1. Findings compatible with multiple punctate acute infarcts in bilateral frontal and left parietal lobes. Given involvement of multiple vascular territories, consider an embolic etiology. 2. Significantly motion limited study.   Date of Discharge from SLP service:October 19, 2023 Clinical Impression/Discharge Summary: Pt has made great gains and has met 4 of 6 LTG's this admission due to improved cognition and language. Pt is currently an overall mod I A for orientation tasks, though requires up to [facility] for attention. Patient requires mod I A for expressive language, though continues to require occasional min-modA for complex receptive language tasks.  Pt/family education complete and pt will discharge home with 24 hour supervision from friends/family/etc. Pt would benefit from OP f/u ST services to maximize cognition and communication in order to maximize functional independence.     STANDARDIZED ASSESSMENTS:   Cognitive Linguistic Quick Test  AGE - 70-89  The Cognitive Linguistic Quick Test (CLQT) was administered to assess the relative status of five cognitive domains: attention, memory, language, executive functioning, and visuospatial skills. Scores from 10 tasks were used to estimate severity ratings (standardized for age groups 18-69 years and 70-89 years)  for each domain, a clock drawing task, as well as an overall composite severity rating of cognition.     Task Score Criterion Cut Scores  Personal Facts 8/8 8  Symbol Cancellation 12/12 10  Confrontation Naming 10/10 10  Clock Drawing  12/13 11  Story Retelling 8/10 5  Symbol Trails 10/10 6  Generative Naming 3/9 4  Design Memory 4/6 4  Mazes  4/8 4  Design Generation 3/13 5   Cognitive Domain Composite Score Severity Rating  Attention 181/215 WNL  Memory 147/185 WNL  Executive Function 20/40 Low-WNL  Language 29/37 Low-WNL  Visuospatial Skills 75/105 WNL  Clock Drawing  12/13 WNL  Composite Severity Rating  WNL     PATIENT REPORTED OUTCOME MEASURES (PROM): Communication Effectiveness Survey: 12/20/23 - pt scored herself 18/32, with higher scores indicating less of a negative effect of pt's deficits on daily life and QOL.  Upn d/c pt scored herself 20/32, improvement from initial administration.  TREATMENT DATE:  Semantic Feature Analysis (SFA), Verb Network Strengthening Treatment=VNeST  04/30/24: Today SLP and pt, and husband talked about what pt could cont to do post-d/c from ST. SLP educated pt and husband about VNeST and SLP assisted pt perform with bite as the verb. She filled out the PROM and scored as above. Pt thanked SLP for his assistance, and husband thanked SLP.   04/23/24: Pt cont to perform 30 minutes of language tasks a few times a week. She arrives this afternoon with homework 30% complete. Pt described some family difficulties at this time and so SLP encouraged pt to cont to complete 30 minutes of language as she is able. She will bake/cook something from a recipe she hasn't done in a long time in the next week. Next session will be pt/husband/SLP talking to ensure pt and husband know activities pt can do after d/c and other questions If  necessary one more visit to discuss these things 05/07/24, or possible d/c next session.  04/17/24: SLP discussed with pt and husband plan for pt after d/c 05/07/24. SLP attempted to assist pt and husband find an aphasia workbook but unsuccessful. Since it has been 30 days since pt has been seen, SLP asked pt and husband about pt's status. [name] stated pt's word finding appears slightly better but no drastic improvement over the interim since previous appointment. Pt's conversation today was WNL given one anomic instance which req'd 6 seconds extra time and pt generated her target word.   03/19/24: [name] states pt still has difficulty with staying on task, occasionally. Pt and husband going on short vacation next week and to target anomia, pt generated list of what she will take on the trip. Anomia occurred x4, pt with SLP cues to describe x2 (successful for pt to self-generate the word) and she spontaneously described the item with success the last time. One instance (crypt) pt unable to generate correct word given SLP letter and semantic cues. Extra time consistently necessary. She req'd min A back to task due to self-distracting comments. ST to cont to focus on word finding.   03/13/24: SLP targeted cognitive word finding with pt today and assisted her with dual/triple meaning words. She req'd consistent cues for a third definition, verbal fluency was WNL for this task. Pt speed of response in conversation and in therapy tasks was quicker today than 03/11/24. Discussion about ST frequency after next week - pt with one appointment left. Pt, husband and SLP decided once/week for 4 weeks is best. Pt to scheudle more ST (after next week) in four weeks due to SLP current schedule. SLP spent much of the remainder of the session educating pt and husband what she could do in that interim.   03/11/24: Pt was taken off Wellbutrin  03/05/24 and starting approx 03/07/24 exhibited reduced attention and reduced ability to  communicate verbally. Pt agreed with SLP when SLP asked her if she felt scattered. Pt also said she feels more withdrawn than she has previosly.  SLP reviewed pt's homework - [name] assisted with her homework Pt's verbal expression was not remarkably different than previous sessions except she appeared to have slightly more difficulty with verbal expression in specific conversation about daughter's artwork. In three instances today In 2/3 instances today, the SLP asked the patient direct questions to prompt use of more specific words. During this time of mod SLP cues, the patient used the target word in a sentence in 10-30 seconds and was unaware of her success.  03/05/24: SLP reviewed pt's homework with her - req'd mod-max A consistently for words associated with a given concept. SLP modeled cueing methods for [name] in order for him to assist pt at home with this task/these anomia tasks. SLP educated pt/husband that it appears her premorbid cognitive differences are making it more challenging for her to experience more success - SLP does not usually need to provide this level of cueing on these lower-level tasks for pts post CVA. Pt may not return to baseline with her deficits presenting the way they are at this point post CVA.  02/28/24: Pt reports no frustrations or problems as of now with cognition currently and she is satisfied to focus on anomia in ST beginning next session. SLP suggested pt keep track of her own homework in a manner she desires. SLP assisted pt with deciding she could do this with her clipboard, and provide homework to SLP at beginning of sessions. SLP reviewed pt's homework with her. Pt unaware of 2 details on detailed written instructions. SLP gave consistent mod A for [place] . Homework provided for word finding.  02/26/24:  SLP assisted pt with error awareness and reviewed homework with her - pt req'd min-mod A for awareness with this task (map). Her groceries  task demonstrated min decr'd organization, req'd mod A for error awareness, and she req'd mod semantic cues during this task to generate dairy due to anomia. SLP encouraged pt and [name] to cont SFA at home PRN. They will complete smoke alarm with SFA.   02/19/24: SLP assisted pt in problem solving with [employer] given the information she found. Initially she felt like she could keep up but SLP talked further with pt about prophecy books and she decided this was not best for her to participate. Pt had difficulty arriving at a way she could participate but not necessarily do al lthe work necessary and SLP helped pt realize other options (problem solve). Pt is  now on Wellbutrin , which she and [name] will assess if attention is improved or same with this med compared to Adderall.    PATIENT EDUCATION: Education details: see treatment date Person educated: Patient and Spouse Education method: Medical illustrator Education comprehension: verbalized understanding and needs further education   GOALS: Goals reviewed with patient? Yes in general  SHORT TERM GOALS: Target date: 01/05/24  Pt will complete cognitive linguistic assessment in first 2 sessions  Baseline: Goal status: met  2.  Pt will successfully use anomia compensations with min A to do so, for 10 minutes functional simple conversation in 2 sessions Baseline:  Goal status: partially met  3.  Pt will demo how to use SFA with rare min A in 3 sessions Baseline: 01/03/24 Goal status: partially met  4.  Pt will demo [facility] problem solving skills in simple cognitive linguistic tasks in 2 sessions Baseline: 01/03/24 Goal status: partially met   LONG TERM GOALS: Target date:   05/10/24  Pt will improve PROM compared to initial administration Baseline:  Goal status: met  2.  Pt will participate in 10 minutes mod complex conversation with functional expressive language (using anomia compensations) in 3  sessions Baseline: 03/13/24, 04/17/24 Goal status: met  3.  Pt will demo functional problem solving skills in practical cognitive linguistic tasks in 3 sessions Baseline:  Goal status: deferred, per pt   ASSESSMENT:  CLINICAL IMPRESSION: Discharge from ST today. Pt status remains primarily unchanged from last session. Patient is a 73 y.o. F  who was seen today for treatment of aphasia in light of bilateral frontal CVA, and parietal CVA. See treatment date above for today's date for further details on today's session. Problem solving goals were d/c'd in light of this. Pt with premorbid dx of ADHD and MCI.   OBJECTIVE IMPAIRMENTS: include attention, memory, awareness, and aphasia. These impairments are limiting patient from managing medications, managing appointments, managing finances, household responsibilities, ADLs/IADLs, and effectively communicating at home and in community. Factors affecting potential to achieve goals and functional outcome are ability to learn/carryover information and previous level of function. Patient will benefit from skilled SLP services to address above impairments and improve overall function.  REHAB POTENTIAL: Good  PLAN:    d/c today  PLANNED INTERVENTIONS: Language facilitation, Environmental controls, Cueing hierachy, Cognitive reorganization, Internal/external aids, Functional tasks, Multimodal communication approach, SLP instruction and feedback, Compensatory strategies, Patient/family education, [phone] Treatment of speech (30 or 45 min) , and [id number] (Standardized cognitive assessment)    Elfie Costanza, CCC-SLP 04/30/2024, 2:43 PM

## 2024-05-02 ENCOUNTER — Encounter: Payer: Self-pay | Admitting: Adult Health

## 2024-05-02 ENCOUNTER — Ambulatory Visit: Admitting: Adult Health

## 2024-05-02 VITALS — BP 130/80 | HR 49 | Temp 97.0°F | Ht 59.0 in | Wt 109.0 lb

## 2024-05-02 DIAGNOSIS — E782 Mixed hyperlipidemia: Secondary | ICD-10-CM

## 2024-05-02 DIAGNOSIS — R42 Dizziness and giddiness: Secondary | ICD-10-CM

## 2024-05-02 DIAGNOSIS — F339 Major depressive disorder, recurrent, unspecified: Secondary | ICD-10-CM

## 2024-05-02 LAB — CBC
HCT: 43.5 % (ref 36.0–46.0)
Hemoglobin: 14.5 g/dL (ref 12.0–15.0)
MCHC: 33.2 g/dL (ref 30.0–36.0)
MCV: 87 fl (ref 78.0–100.0)
Platelets: 211 K/uL (ref 150.0–400.0)
RBC: 5 Mil/uL (ref 3.87–5.11)
RDW: 13.3 % (ref 11.5–15.5)
WBC: 7.4 K/uL (ref 4.0–10.5)

## 2024-05-02 LAB — LIPID PANEL
Cholesterol: 131 mg/dL (ref 0–200)
HDL: 58 mg/dL (ref >39.00)
LDL Cholesterol: 53 mg/dL (ref 0–99)
NonHDL: 72.64
Total CHOL/HDL Ratio: 2
Triglycerides: 96 mg/dL (ref 0.0–149.0)
VLDL: 19.2 mg/dL (ref 0.0–40.0)

## 2024-05-02 LAB — COMPREHENSIVE METABOLIC PANEL WITH GFR
ALT: 24 U/L (ref 0–35)
AST: 20 U/L (ref 0–37)
Albumin: 4.8 g/dL (ref 3.5–5.2)
Alkaline Phosphatase: 124 U/L — ABNORMAL HIGH (ref 39–117)
BUN: 17 mg/dL (ref 6–23)
CO2: 30 meq/L (ref 19–32)
Calcium: 10.1 mg/dL (ref 8.4–10.5)
Chloride: 101 meq/L (ref 96–112)
Creatinine, Ser: 0.8 mg/dL (ref 0.40–1.20)
GFR: 72 mL/min (ref >60.00)
Glucose, Bld: 110 mg/dL — ABNORMAL HIGH (ref 70–99)
Potassium: 4.3 meq/L (ref 3.5–5.1)
Sodium: 139 meq/L (ref 135–145)
Total Bilirubin: 0.5 mg/dL (ref 0.2–1.2)
Total Protein: 7.5 g/dL (ref 6.0–8.3)

## 2024-05-02 NOTE — Progress Notes (Signed)
 Subjective:    Patient ID: Gabrielle Vargas, female    DOB: February 23, 1951, 73 y.o.   MRN: [id number]  HPI  Her sister is with her today.   She presents to the office today for follow up regarding hyperlipidemia. In July 2025 we decreased her dose of Lipitor to 20 mg daily. Back in July she was concerned about hair loss and brought this up to Neurology who recommended decreasing her dose of Lipitor to 20 mg daily to see if this helps with her hair loss.   Today she reports that she has noticed that her hair has stopped falling out since cutting back on Lipitor.   Additionally she reports that she restarted Wellbutrin  150 mg XR on September 27 due to feeling depressed.  He was restarted she feels better but is getting out of the house more feels happier and has more energy.  Acutely she reports that when she got out of the shower this morning she started to feel dizzy, she does have a history of vertigo and this dizziness feels different stating it feels like a minute fall backwards.  She does report fasting yesterday afternoon for blood work she needs to have done today. She denies other symptoms   Review of Systems See HPI   Past Medical History:  Diagnosis Date   Acute combined systolic and diastolic heart failure 10/11/2023   ADHD (attention deficit hyperactivity disorder) 05/09/2007   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; multiple punctate foci of restricted diffusion in bilateral frontal lobes and the left parietal lobe   Diverticulitis of colon (without mention of hemorrhage) 09/17/2013   Essential hypertension 10/09/2017   HFrEF (heart failure with reduced ejection fraction) 10/10/2023   History of COVID-19 2021   Hyperlipidemia    Hypothyroidism    Low back pain radiating to right leg 10/09/2017   Mild cognitive impairment of uncertain or unknown etiology 08/04/2022   Mild intermittent asthma with acute exacerbation 05/30/2017   Mild  neurocognitive disorder due to multiple etiologies 12/19/2023   NSVT (nonsustained ventricular tachycardia) 10/12/2023   Osteoarthritis    Postmenopausal atrophic vaginitis, severe 08/18/2008   Right knee pain 03/13/2014   S/P arthroscopy of right knee 06/03/2014   Vitamin B12 deficiency     Social History   Socioeconomic History   Marital status: Married    Spouse name: Not on file   Number of children: 2   Years of education: 13   Highest education level: Some college, no degree  Occupational History   Occupation: Retired  Tobacco Use   Smoking status: Never   Smokeless tobacco: Never  Vaping Use   Vaping status: Never Used  Substance and Sexual Activity   Alcohol use: No   Drug use: No   Sexual activity: Not on file  Other Topics Concern   Not on file  Social History Narrative   Retired   Married   2 children   Right handed   Live with husband   Social Drivers of Corporate investment banker Strain: Low Risk  (12/23/2022)   Overall Financial Resource Strain (CARDIA)    Difficulty of Paying Living Expenses: Not hard at all  Food Insecurity: Patient Unable To Answer (10/09/2023)   Hunger Vital Sign    Worried About Running Out of Food in the Last Year: Patient unable to answer    Ran Out of Food in the Last Year: Patient unable to answer  Transportation Needs: Patient Unable To  Answer (10/09/2023)   PRAPARE - Transportation    Lack of Transportation (Medical): Patient unable to answer    Lack of Transportation (Non-Medical): Patient unable to answer  Physical Activity: Sufficiently Active (12/23/2022)   Exercise Vital Sign    Days of Exercise per Week: 7 days    Minutes of Exercise per Session: 60 min  Stress: No Stress Concern Present (12/23/2022)   [employer] of Occupational Health - Occupational Stress Questionnaire    Feeling of Stress : Not at all  Social Connections: Patient Unable To Answer (10/09/2023)   Social Connection and Isolation Panel     Frequency of Communication with Friends and Family: Patient unable to answer    Frequency of Social Gatherings with Friends and Family: Patient unable to answer    Attends Religious Services: Patient unable to answer    Active Member of Clubs or Organizations: Patient unable to answer    Attends Banker Meetings: Patient unable to answer    Marital Status: Patient unable to answer  Intimate Partner Violence: Patient Unable To Answer (10/09/2023)   Humiliation, Afraid, Rape, and Kick questionnaire    Fear of Current or Ex-Partner: Patient unable to answer    Emotionally Abused: Patient unable to answer    Physically Abused: Patient unable to answer    Sexually Abused: Patient unable to answer    Past Surgical History:  Procedure Laterality Date   ABDOMINAL HYSTERECTOMY     BREAST SURGERY     CARPAL TUNNEL RELEASE Bilateral    CERVICAL DISC ARTHROPLASTY     CESAREAN SECTION     x2   left shoulder surgery     LOOP RECORDER INSERTION N/A 10/12/2023   Procedure: LOOP RECORDER INSERTION;  Surgeon: Cindie Ole DASEN, MD;  Location: [facility];  Service: Cardiovascular;  Laterality: N/A;   MENISCUS REPAIR Right 2017   REDUCTION MAMMAPLASTY Bilateral    right shoulder     TONSILLECTOMY      Family History  Problem Relation Age of Onset   Melanoma Mother    Kidney disease Father    Diabetes Father    Heart disease Father    Breast cancer Paternal Grandmother    ADD / ADHD Other        multiple family members   Colon cancer Neg Hx    Esophageal cancer Neg Hx    Rectal cancer Neg Hx    Stomach cancer Neg Hx     Allergies  Allergen Reactions   Ativan  [Lorazepam ]     Agitation/paradoxical reaction.   Oxycodone-Acetaminophen  Nausea Only   Latex Hives and Rash    Current Outpatient Medications on File Prior to Visit  Medication Sig Dispense Refill   Acetylcysteine (NAC) 500 MG CAPS Take 500 mg by mouth daily.     Ascorbic Acid (VITAMIN C) 1000 MG tablet  Take 1,000 mg by mouth daily.     aspirin  EC 81 MG tablet Take 81 mg by mouth daily. Swallow whole.     atorvastatin  (LIPITOR) 40 MG tablet Take 0.5 tablets (20 mg total) by mouth daily. 90 tablet 3   Bioflavonoid Products (QUERCETIN COMPLEX IMMUNE PO) Take 500 mg by mouth daily.     buPROPion  (WELLBUTRIN  XL) 150 MG 24 hr tablet TAKE 1 TABLET BY MOUTH DAILY 30 tablet 0   Cholecalciferol (D3-50 PO) Take 1 capsule by mouth daily.     Evening Primrose Oil 1000 MG CAPS Take 1,000 mg by mouth in the morning  and at bedtime.     ezetimibe  (ZETIA ) 10 MG tablet Take 1 tablet (10 mg total) by mouth at bedtime. 90 tablet 3   ipratropium (ATROVENT ) 0.06 % nasal spray Place 2 sprays into both nostrils 2 (two) times daily as needed. 15 mL 12   metoprolol  succinate (TOPROL  XL) 25 MG 24 hr tablet Take 0.5 tablets (12.5 mg total) by mouth daily. 45 tablet 3   SYNTHROID  75 MCG tablet Take 1 tablet (75 mcg total) by mouth daily. 90 tablet 3   vitamin B-12 (CYANOCOBALAMIN ) 1000 MCG tablet Take 1,000 mcg by mouth 2 (two) times a week.     Zinc 20 MG CAPS Take 20 mg by mouth daily.     No current facility-administered medications on file prior to visit.    BP 130/80   Pulse (!) 49   Temp (!) 97 F (36.1 C) (Tympanic)   Ht 4' 11 (1.499 m)   Wt 109 lb (49.4 kg)   SpO2 98%   BMI 22.02 kg/m       Objective:   Physical Exam Vitals and nursing note reviewed.  Constitutional:      Appearance: Normal appearance.  Cardiovascular:     Rate and Rhythm: Regular rhythm.     Pulses: Normal pulses.     Heart sounds: Normal heart sounds.  Pulmonary:     Effort: Pulmonary effort is normal.     Breath sounds: Normal breath sounds.  Musculoskeletal:        General: Normal range of motion.  Skin:    General: Skin is warm and dry.  Neurological:     General: No focal deficit present.     Mental Status: She is alert and oriented to person, place, and time.  Psychiatric:        Mood and Affect: Mood normal.         Behavior: Behavior normal.        Thought Content: Thought content normal.        Judgment: Judgment normal.       Assessment & Plan:  1. Mixed hyperlipidemia (Primary)  - Lipid panel; Future  2. Dizziness - Possibly from fasting. Will have her do her blood work today and then go get breakfast. If symptoms continue then she will let me know  - CBC; Future - Comprehensive metabolic panel with GFR; Future  3. Depression, recurrent - I am fine with her being on Wellbutrin  150 mg ER  Reyhan Moronta, NP

## 2024-05-03 ENCOUNTER — Ambulatory Visit: Payer: Self-pay | Admitting: Adult Health

## 2024-05-07 ENCOUNTER — Ambulatory Visit

## 2024-05-07 DIAGNOSIS — L578 Other skin changes due to chronic exposure to nonionizing radiation: Secondary | ICD-10-CM | POA: Diagnosis not present

## 2024-05-07 DIAGNOSIS — L570 Actinic keratosis: Secondary | ICD-10-CM | POA: Diagnosis not present

## 2024-05-07 DIAGNOSIS — D1801 Hemangioma of skin and subcutaneous tissue: Secondary | ICD-10-CM | POA: Diagnosis not present

## 2024-05-07 DIAGNOSIS — L814 Other melanin hyperpigmentation: Secondary | ICD-10-CM | POA: Diagnosis not present

## 2024-05-07 DIAGNOSIS — L821 Other seborrheic keratosis: Secondary | ICD-10-CM | POA: Diagnosis not present

## 2024-05-08 ENCOUNTER — Other Ambulatory Visit: Payer: Self-pay | Admitting: Adult Health

## 2024-05-08 DIAGNOSIS — F909 Attention-deficit hyperactivity disorder, unspecified type: Secondary | ICD-10-CM

## 2024-05-10 ENCOUNTER — Encounter

## 2024-05-23 ENCOUNTER — Telehealth: Payer: Self-pay | Admitting: *Deleted

## 2024-05-23 NOTE — Telephone Encounter (Signed)
 Copied from CRM #[id number]. Topic: Clinical - Medication Question >> May 23, 2024  1:52 PM Thersia BROCKS wrote: Reason for CRM: Patient husband called in regarding patient mediaction, would like to speak with Marjorie regarding this would like a callback   [phone]

## 2024-05-23 NOTE — Telephone Encounter (Signed)
 Pt has been scheduled for VV

## 2024-05-23 NOTE — Telephone Encounter (Signed)
 Spoke to pt husband and he stated that pt has been having depression. He wants to know if the dosage should be raised up. They are having family issues as well at the moment.

## 2024-05-24 ENCOUNTER — Telehealth (INDEPENDENT_AMBULATORY_CARE_PROVIDER_SITE_OTHER): Admitting: Adult Health

## 2024-05-24 ENCOUNTER — Encounter: Payer: Self-pay | Admitting: Adult Health

## 2024-05-24 VITALS — Ht 59.0 in | Wt 109.0 lb

## 2024-05-24 DIAGNOSIS — F331 Major depressive disorder, recurrent, moderate: Secondary | ICD-10-CM | POA: Diagnosis not present

## 2024-05-24 DIAGNOSIS — F419 Anxiety disorder, unspecified: Secondary | ICD-10-CM

## 2024-05-24 MED ORDER — BUPROPION HCL ER (XL) 300 MG PO TB24: 300.0000 mg | ORAL_TABLET | Freq: Every day | ORAL | 1 refills | Status: AC

## 2024-05-24 NOTE — Progress Notes (Signed)
 Virtual Visit via Video Note  I connected with verner kopischke on 05/24/24 at  2:00 PM EST by a video enabled telemedicine application and verified that I am speaking with the correct person using two identifiers.  Location patient: home Location provider:work or home office Persons participating in the virtual visit: patient, provider  I discussed the limitations of evaluation and management by telemedicine and the availability of in person appointments. The patient expressed understanding and agreed to proceed.   HPI: Discussed the use of AI scribe software for clinical note transcription with the patient, who gave verbal consent to proceed.  History of Present Illness   Gabrielle Vargas is a 73 year old female with a history of stroke who presents with depression and anxiety. She is accompanied by her husband, [name].  She experiences persistent depression and anxiety despite taking Wellbutrin  150 mg XR daily, Initial improvements in energy and mood were not sustained.  Ongoing family stress, particularly involving her daughter Lauraine, exacerbates her anxiety. She feels caught in family conflicts, leading to frustration and helplessness. Sarah's tendency to blame others and avoid responsibility results in tension and arguments, complicating communication and increasing anxiety.        ROS: See pertinent positives and negatives per HPI.  Past Medical History:  Diagnosis Date   Acute combined systolic and diastolic heart failure 10/11/2023   ADHD (attention deficit hyperactivity disorder) 05/09/2007   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; multiple punctate foci of restricted diffusion in bilateral frontal lobes and the left parietal lobe   Diverticulitis of colon (without mention of hemorrhage) 09/17/2013   Essential hypertension 10/09/2017   HFrEF (heart failure with reduced ejection fraction) 10/10/2023   History of COVID-19 2021    Hyperlipidemia    Hypothyroidism    Low back pain radiating to right leg 10/09/2017   Mild cognitive impairment of uncertain or unknown etiology 08/04/2022   Mild intermittent asthma with acute exacerbation 05/30/2017   Mild neurocognitive disorder due to multiple etiologies 12/19/2023   NSVT (nonsustained ventricular tachycardia) 10/12/2023   Osteoarthritis    Postmenopausal atrophic vaginitis, severe 08/18/2008   Right knee pain 03/13/2014   S/P arthroscopy of right knee 06/03/2014   Vitamin B12 deficiency     Past Surgical History:  Procedure Laterality Date   ABDOMINAL HYSTERECTOMY     BREAST SURGERY     CARPAL TUNNEL RELEASE Bilateral    CERVICAL DISC ARTHROPLASTY     CESAREAN SECTION     x2   left shoulder surgery     LOOP RECORDER INSERTION N/A 10/12/2023   Procedure: LOOP RECORDER INSERTION;  Surgeon: Cindie Ole DASEN, MD;  Location: [facility];  Service: Cardiovascular;  Laterality: N/A;   MENISCUS REPAIR Right 2017   REDUCTION MAMMAPLASTY Bilateral    right shoulder     TONSILLECTOMY      Family History  Problem Relation Age of Onset   Melanoma Mother    Kidney disease Father    Diabetes Father    Heart disease Father    Breast cancer Paternal Grandmother    ADD / ADHD Other        multiple family members   Colon cancer Neg Hx    Esophageal cancer Neg Hx    Rectal cancer Neg Hx    Stomach cancer Neg Hx        Current Outpatient Medications:    Acetylcysteine (NAC) 500 MG CAPS, Take 500 mg by mouth daily., Disp: ,  Rfl:    Ascorbic Acid (VITAMIN C) 1000 MG tablet, Take 1,000 mg by mouth daily., Disp: , Rfl:    aspirin  EC 81 MG tablet, Take 81 mg by mouth daily. Swallow whole., Disp: , Rfl:    atorvastatin  (LIPITOR) 40 MG tablet, Take 0.5 tablets (20 mg total) by mouth daily., Disp: 90 tablet, Rfl: 3   Bioflavonoid Products (QUERCETIN COMPLEX IMMUNE PO), Take 500 mg by mouth daily., Disp: , Rfl:    Cholecalciferol (D3-50 PO), Take 1 capsule by  mouth daily., Disp: , Rfl:    Evening Primrose Oil 1000 MG CAPS, Take 1,000 mg by mouth in the morning and at bedtime., Disp: , Rfl:    ezetimibe  (ZETIA ) 10 MG tablet, Take 1 tablet (10 mg total) by mouth at bedtime., Disp: 90 tablet, Rfl: 3   ipratropium (ATROVENT ) 0.06 % nasal spray, Place 2 sprays into both nostrils 2 (two) times daily as needed., Disp: 15 mL, Rfl: 12   metoprolol  succinate (TOPROL  XL) 25 MG 24 hr tablet, Take 0.5 tablets (12.5 mg total) by mouth daily., Disp: 45 tablet, Rfl: 3   SYNTHROID  75 MCG tablet, Take 1 tablet (75 mcg total) by mouth daily., Disp: 90 tablet, Rfl: 3   vitamin B-12 (CYANOCOBALAMIN ) 1000 MCG tablet, Take 1,000 mcg by mouth 2 (two) times a week., Disp: , Rfl:    Zinc 20 MG CAPS, Take 20 mg by mouth daily., Disp: , Rfl:   EXAM:  VITALS per patient if applicable:  GENERAL: alert, oriented, appears well and in no acute distress  HEENT: atraumatic, conjunttiva clear, no obvious abnormalities on inspection of external nose and ears  NECK: normal movements of the head and neck  LUNGS: on inspection no signs of respiratory distress, breathing rate appears normal, no obvious gross SOB, gasping or wheezing  CV: no obvious cyanosis  MS: moves all visible extremities without noticeable abnormality  PSYCH/NEURO: pleasant and cooperative, tearful.   ASSESSMENT AND PLAN:  Discussed the following assessment and plan:  Assessment and Plan    Major depressive disorder, recurrent Recurrent depression with anxiety exacerbated by family stressors. Current Wellbutrin  150 mg ineffective. No contraindications for increased dosage. Potential side effects include diarrhea, but prior tolerance noted. - Increased Wellbutrin  to 300 mg daily. - Instructed to monitor for side effects, particularly diarrhea. - Advised to contact office if symptoms do not improve after two weeks. - Discussed potential benefits of therapy.      Darleene Shape, NP  I personally spent  a total of 31 minutes in the care of the patient today including preparing to see the patient, getting/reviewing separately obtained history, counseling and educating, placing orders, documenting clinical information in the EHR, and listening .     I discussed the assessment and treatment plan with the patient. The patient was provided an opportunity to ask questions and all were answered. The patient agreed with the plan and demonstrated an understanding of the instructions.   The patient was advised to call back or seek an in-person evaluation if the symptoms worsen or if the condition fails to improve as anticipated.   Pa Tennant, NP

## 2024-05-27 ENCOUNTER — Encounter

## 2024-05-27 ENCOUNTER — Ambulatory Visit

## 2024-05-27 DIAGNOSIS — I639 Cerebral infarction, unspecified: Secondary | ICD-10-CM

## 2024-05-28 LAB — CUP PACEART REMOTE DEVICE CHECK
Date Time Interrogation Session: 20251109234327
Implantable Pulse Generator Implant Date: 20250327

## 2024-05-29 ENCOUNTER — Ambulatory Visit: Payer: Self-pay | Admitting: Cardiology

## 2024-05-30 NOTE — Progress Notes (Signed)
 Remote Loop Recorder Transmission

## 2024-06-11 ENCOUNTER — Ambulatory Visit

## 2024-06-11 ENCOUNTER — Ambulatory Visit: Admitting: Family Medicine

## 2024-06-11 ENCOUNTER — Encounter: Payer: Self-pay | Admitting: Family Medicine

## 2024-06-11 DIAGNOSIS — Z0000 Encounter for general adult medical examination without abnormal findings: Secondary | ICD-10-CM

## 2024-06-11 NOTE — Patient Instructions (Signed)
 I really enjoyed getting to talk with you today! I am available on Tuesdays and Thursdays for virtual visits if you have any questions or concerns, or if I can be of any further assistance.   CHECKLIST FROM ANNUAL WELLNESS VISIT:  -Follow up (please call to schedule if not scheduled after visit):   -yearly for annual wellness visit with primary care office  Here is a list of your preventive care/health maintenance measures and the plan for each if any are due:  PLAN For any measures below that may be due:    1. Please let us  know if you want us  to order colon cancer screening or the bone density test.   Health Maintenance  Topic Date Due   Colonoscopy  10/12/2023   Medicare Annual Wellness (AWV)  12/23/2023   COVID-19 Vaccine (1) 06/27/2024 (Originally 10/04/1955)   Diabetic kidney evaluation - Urine ACR  09/04/2028 (Originally 10/05/1968)   Mammogram  04/19/2025   Diabetic kidney evaluation - eGFR measurement  05/02/2025   DTaP/Tdap/Td (3 - Td or Tdap) 01/26/2026   Pneumococcal Vaccine: 50+ Years  Completed   Influenza Vaccine  Completed   Bone Density Scan  Completed   Hepatitis C Screening  Completed   Zoster Vaccines- Shingrix  Completed   Meningococcal B Vaccine  Aged Out    -See a dentist at least yearly  -Get your eyes checked and then per your eye specialist's recommendations  -Other issues addressed today:   -I have included below further information regarding a healthy whole foods based diet, physical activity guidelines for adults, stress management and opportunities for social connections. I hope you find this information useful.   -----------------------------------------------------------------------------------------------------------------------------------------------------------------------------------------------------------------------------------------------------------    NUTRITION: -eat real food: lots of colorful vegetables (half the plate) and  fruits -5-7 servings of vegetables and fruits per day (fresh or steamed is best), exp. 2 servings of vegetables with lunch and dinner and 2 servings of fruit per day. Berries and greens such as kale and collards are great choices.  -consume on a regular basis:  fresh fruits, fresh veggies, fish, nuts, seeds, healthy oils (such as olive oil, avocado oil), whole grains (make sure for bread/pasta/crackers/etc., that the first ingredient on label contains the word whole), legumes. -can eat small amounts of dairy and lean meat (no larger than the palm of your hand), but avoid processed meats such as ham, bacon, lunch meat, etc. -drink water  -try to avoid fast food and pre-packaged foods, processed meat, ultra processed foods/beverages (donuts, candy, etc.) -most experts advise limiting sodium to < 2300mg  per day, should limit further is any chronic conditions such as high blood pressure, heart disease, diabetes, etc. The American Heart Association advised that < 1500mg  is is ideal -try to avoid foods/beverages that contain any ingredients with names you do not recognize  -try to avoid foods/beverages  with added sugar or sweeteners/sweets  -try to avoid sweet drinks (including diet drinks): soda, juice, Gatorade, sweet tea, power drinks, diet drinks -try to avoid white rice, white bread, pasta (unless whole grain)  EXERCISE GUIDELINES FOR ADULTS: -if you wish to increase your physical activity, do so gradually and with the approval of your doctor -STOP and seek medical care immediately if you have any chest pain, chest discomfort or trouble breathing when starting or increasing exercise  -move and stretch your body, legs, feet and arms when sitting for long periods -Physical activity guidelines for optimal health in adults: -get at least 150 minutes per week of moderate exercise (can  talk, but not sing); this is about 20-30 minutes of sustained activity 5-7 days per week or two 10-15 minute episodes of  sustained activity 5-7 days per week -do some muscle building/resistance training/strength training at least 2 days per week  -balance exercises 3+ days per week:   Stand somewhere where you have something sturdy to hold onto if you lose balance    1) lift up on toes, then back down, start with 5x per day and work up to 20x   2) stand and lift one leg straight out to the side so that foot is a few inches of the floor, start with 5x each side and work up to 20x each side   3) stand on one foot, start with 5 seconds each side and work up to 20 seconds on each side  If you need ideas or help with getting more active:  -Silver sneakers https://tools.silversneakers.com  -Walk with a Doc: Http://www.duncan-williams.com/  -try to include resistance (weight lifting/strength building) and balance exercises twice per week: or the following link for ideas: http://castillo-powell.com/  buyducts.dk  STRESS MANAGEMENT: -can try meditating, or just sitting quietly with deep breathing while intentionally relaxing all parts of your body for 5 minutes daily -if you need further help with stress, anxiety or depression please follow up with your primary doctor or contact the wonderful folks at [facility]: [phone]  SOCIAL CONNECTIONS: -options in [place] if you wish to engage in more social and exercise related activities:  -Silver sneakers https://tools.silversneakers.com  -Walk with a Doc: Http://www.duncan-williams.com/  -Check out the [facility] Active Adults 50+ section on the [place] of [employer] companies (hiking clubs, book clubs, cards and games, chess, exercise classes, aquatic classes and much more) - see the website for details: https://www.[place].gov/departments/parks-recreation/active-adults50  -YouTube has lots of exercise videos for different ages and abilities as well  -[facility] (a variety of indoor and outdoor inperson activities for adults). [phone]. [address]  -Virtual Online Classes (a variety of topics): see seniorplanet.org or call [phone]  -consider volunteering at a school, hospice center, church, senior center or elsewhere

## 2024-06-11 NOTE — Progress Notes (Signed)
 -----------------------------------------------------------------------------------------------------------------------------------------------------------------  Because this visit was a virtual/telehealth visit, some criteria may be missing or patient reported. Any vitals not documented were not able to be obtained and vitals that have been documented are patient reported.    MEDICARE ANNUAL PREVENTIVE VISIT WITH PROVIDER: (Welcome to Medicare, initial annual wellness or annual wellness exam)  Virtual Visit via Video Note  I connected with Gabrielle Vargas on 06/11/24 by a video enabled telemedicine application and verified that I am speaking with the correct person using two identifiers.  Location patient: home Location provider:work or home office Persons participating in the virtual visit: patient, provider  Concerns and/or follow up today: detailed intake and risks/health assessment completed in flowsheets and below - please see for details.   How often do you have a drink containing alcohol?none How many drinks containing alcohol do you have on a typical day when you are drinking?na How often do you have six or more drinks on one occasion?na Have you ever smoked?n Quit date if applicable? na  How many packs a day do/did you smoke? na Do you use smokeless tobacco?na Do you use an illicit drugs?na Do you feel safe at home?y Last dentist visit? Goes on a regular basis Last eye Exam and location? Dr. Abigail, goes on a regular basis   See HM section in Epic for other details of completed HM.    ROS: negative for report of fevers, unintentional weight loss, vision changes, vision loss, hearing loss or change, chest pain, sob, hemoptysis, melena, hematochezia, hematuria or bleeding or bruising  Patient-completed extensive health risk assessment - reviewed and discussed with the patient: See Health Risk Assessment completed with patient prior to the visit either above or in  recent phone note. This was reviewed in detailed with the patient today and appropriate recommendations, orders and referrals were placed as needed per Summary below and patient instructions.   Review of Medical History: -PMH, PSH, Family History and current specialty and care providers reviewed and updated and listed below   Patient Care Team: Merna Huxley, NP as PCP - General (Family Medicine) Okey Vina GAILS, MD as PCP - Cardiology (Cardiology) Cindie Ole DASEN, MD as PCP - Electrophysiology (Cardiology) Liane Sharyne MATSU, [facility] (Inactive) as Pharmacist (Pharmacist) Dina Camie FORBES DEVONNA (Neurology)   Past Medical History:  Diagnosis Date   Acute combined systolic and diastolic heart failure 10/11/2023   ADHD (attention deficit hyperactivity disorder) 05/09/2007   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; multiple punctate foci of restricted diffusion in bilateral frontal lobes and the left parietal lobe   Diverticulitis of colon (without mention of hemorrhage) 09/17/2013   Essential hypertension 10/09/2017   HFrEF (heart failure with reduced ejection fraction) 10/10/2023   History of COVID-19 2021   Hyperlipidemia    Hypothyroidism    Low back pain radiating to right leg 10/09/2017   Mild cognitive impairment of uncertain or unknown etiology 08/04/2022   Mild intermittent asthma with acute exacerbation 05/30/2017   Mild neurocognitive disorder due to multiple etiologies 12/19/2023   NSVT (nonsustained ventricular tachycardia) 10/12/2023   Osteoarthritis    Postmenopausal atrophic vaginitis, severe 08/18/2008   Right knee pain 03/13/2014   S/P arthroscopy of right knee 06/03/2014   Vitamin B12 deficiency     Past Surgical History:  Procedure Laterality Date   ABDOMINAL HYSTERECTOMY     BREAST SURGERY     CARPAL TUNNEL RELEASE Bilateral    CERVICAL DISC ARTHROPLASTY     CESAREAN SECTION  x2   left shoulder surgery     LOOP RECORDER INSERTION N/A  10/12/2023   Procedure: LOOP RECORDER INSERTION;  Surgeon: Cindie Ole DASEN, MD;  Location: [facility] INVASIVE CV LAB;  Service: Cardiovascular;  Laterality: N/A;   MENISCUS REPAIR Right 2017   REDUCTION MAMMAPLASTY Bilateral    right shoulder     TONSILLECTOMY      Social History   Socioeconomic History   Marital status: Married    Spouse name: Not on file   Number of children: 2   Years of education: 13   Highest education level: Some college, no degree  Occupational History   Occupation: Retired  Tobacco Use   Smoking status: Never   Smokeless tobacco: Never  Vaping Use   Vaping status: Never Used  Substance and Sexual Activity   Alcohol use: No   Drug use: No   Sexual activity: Not on file  Other Topics Concern   Not on file  Social History Narrative   Retired   Married   2 children   Right handed   Live with husband   Social Drivers of Corporate Investment Banker Strain: Low Risk  (12/23/2022)   Overall Financial Resource Strain (CARDIA)    Difficulty of Paying Living Expenses: Not hard at all  Food Insecurity: Patient Unable To Answer (06/11/2024)   Hunger Vital Sign    Worried About Running Out of Food in the Last Year: Patient unable to answer    Ran Out of Food in the Last Year: Patient unable to answer  Transportation Needs: Patient Unable To Answer (06/11/2024)   PRAPARE - Transportation    Lack of Transportation (Medical): Patient unable to answer    Lack of Transportation (Non-Medical): Patient unable to answer  Physical Activity: Sufficiently Active (06/11/2024)   Exercise Vital Sign    Days of Exercise per Week: 7 days    Minutes of Exercise per Session: 40 min  Stress: No Stress Concern Present (06/11/2024)   [employer] of Occupational Health - Occupational Stress Questionnaire    Feeling of Stress: Not at all  Social Connections: Moderately Integrated (06/11/2024)   Social Connection and Isolation Panel    Frequency of Communication with  Friends and Family: More than three times a week    Frequency of Social Gatherings with Friends and Family: Twice a week    Attends Religious Services: More than 4 times per year    Active Member of [employer] or Organizations: No    Attends Banker Meetings: Patient unable to answer    Marital Status: Married  Catering Manager Violence: Patient Unable To Answer (10/09/2023)   Humiliation, Afraid, Rape, and Kick questionnaire    Fear of Current or Ex-Partner: Patient unable to answer    Emotionally Abused: Patient unable to answer    Physically Abused: Patient unable to answer    Sexually Abused: Patient unable to answer    Family History  Problem Relation Age of Onset   Melanoma Mother    Kidney disease Father    Diabetes Father    Heart disease Father    Breast cancer Paternal Grandmother    ADD / ADHD Other        multiple family members   Colon cancer Neg Hx    Esophageal cancer Neg Hx    Rectal cancer Neg Hx    Stomach cancer Neg Hx     Current Outpatient Medications on File Prior to Visit  Medication Sig Dispense  Refill   Acetylcysteine (NAC) 500 MG CAPS Take 500 mg by mouth daily.     Ascorbic Acid (VITAMIN C) 1000 MG tablet Take 1,000 mg by mouth daily.     aspirin  EC 81 MG tablet Take 81 mg by mouth daily. Swallow whole.     atorvastatin  (LIPITOR) 40 MG tablet Take 0.5 tablets (20 mg total) by mouth daily. 90 tablet 3   Bioflavonoid Products (QUERCETIN COMPLEX IMMUNE PO) Take 500 mg by mouth daily.     buPROPion  (WELLBUTRIN  XL) 300 MG 24 hr tablet Take 1 tablet (300 mg total) by mouth daily. 90 tablet 1   Cholecalciferol (D3-50 PO) Take 1 capsule by mouth daily.     Evening Primrose Oil 1000 MG CAPS Take 1,000 mg by mouth in the morning and at bedtime.     ezetimibe  (ZETIA ) 10 MG tablet Take 1 tablet (10 mg total) by mouth at bedtime. 90 tablet 3   ipratropium (ATROVENT ) 0.06 % nasal spray Place 2 sprays into both nostrils 2 (two) times daily as needed. 15 mL  12   metoprolol  succinate (TOPROL  XL) 25 MG 24 hr tablet Take 0.5 tablets (12.5 mg total) by mouth daily. 45 tablet 3   SYNTHROID  75 MCG tablet Take 1 tablet (75 mcg total) by mouth daily. 90 tablet 3   vitamin B-12 (CYANOCOBALAMIN ) 1000 MCG tablet Take 1,000 mcg by mouth 2 (two) times a week.     Zinc 20 MG CAPS Take 20 mg by mouth daily.     No current facility-administered medications on file prior to visit.    Allergies  Allergen Reactions   Ativan  [Lorazepam ]     Agitation/paradoxical reaction.   Oxycodone-Acetaminophen  Nausea Only   Latex Hives and Rash       Physical Exam Vitals requested from patient and listed below if patient had equipment and was able to obtain at home for this virtual visit: There were no vitals filed for this visit. Estimated body mass index is 22.02 kg/m as calculated from the following:   Height as of 05/24/24: 4' 11 (1.499 m).   Weight as of 05/24/24: 109 lb (49.4 kg).  EKG (optional): deferred due to virtual visit  GENERAL: alert, oriented, no acute distress detected, full vision exam deferred due to pandemic and/or virtual encounter   HEENT: atraumatic, conjunttiva clear, no obvious abnormalities on inspection of external nose and ears  NECK: normal movements of the head and neck  LUNGS: on inspection no signs of respiratory distress, breathing rate appears normal, no obvious gross SOB, gasping or wheezing  CV: no obvious cyanosis  MS: moves all visible extremities without noticeable abnormality  PSYCH/NEURO: pleasant and cooperative, no obvious depression or anxiety, speech and thought processing grossly intact, Cognitive function grossly intact  Flowsheet Row Clinical Support from 06/11/2024 in [facility]  PHQ-9 Total Score 14        06/11/2024   11:50 AM 05/24/2024    1:50 PM 10/31/2023    3:28 PM 12/23/2022   12:46 PM 12/14/2021    9:22 AM  Depression screen PHQ 2/9  Decreased Interest 0 2 0 0 0   Down, Depressed, Hopeless 1 2 0 0 0  PHQ - 2 Score 1 4 0 0 0  Altered sleeping 1 0 0    Tired, decreased energy 2 2 0    Change in appetite 3 0 0    Feeling bad or failure about yourself  3 0 0  Trouble concentrating 1 1 0    Moving slowly or fidgety/restless 3 2 0    Suicidal thoughts 0 0 0    PHQ-9 Score 14 9 0     Difficult doing work/chores  Not difficult at all        Data saved with a previous flowsheet row definition   From time to time since having a stroke. Seeing PCP for this and reports is doing ok.     08/23/2023    8:58 AM 10/31/2023    3:28 PM 06/11/2024   11:50 AM 06/11/2024   11:59 AM 06/11/2024   12:21 PM  Fall Risk  Falls in the past year? 0 0  0   Was there an injury with Fall? 0  0 0   Fall Risk Category Calculator 0   0   Patient at Risk for Falls Due to   No Fall Risks No Fall Risks No Fall Risks  Fall risk Follow up Falls evaluation completed  Falls evaluation completed Falls evaluation completed Falls evaluation completed     SUMMARY AND PLAN:  Encounter for Medicare annual wellness exam  Discussed applicable health maintenance/preventive health measures and advised and referred or ordered per patient preferences: -she declined colonoscopy - had normal one 10 years ago, discussed other options and she is considering cologuard and they say they will order online -declined bone density and covid vaccine Health Maintenance  Topic Date Due   Colonoscopy  10/12/2023   COVID-19 Vaccine (1) 06/27/2024 (Originally 10/04/1955)   Diabetic kidney evaluation - Urine ACR  09/04/2028 (Originally 10/05/1968)   Mammogram  04/19/2025   Diabetic kidney evaluation - eGFR measurement  05/02/2025   Medicare Annual Wellness (AWV)  06/11/2025   DTaP/Tdap/Td (3 - Td or Tdap) 01/26/2026   Pneumococcal Vaccine: 50+ Years  Completed   Influenza Vaccine  Completed   Bone Density Scan  Completed   Hepatitis C Screening  Completed   Zoster Vaccines- Shingrix  Completed    Meningococcal B Vaccine  Aged Out      Education and counseling on the following was provided based on the above review of health and a plan/checklist for the patient, along with additional information discussed, was provided for the patient in the patient instructions :  -discussed CBT for depression, number provided to schedule with [facility] if decides to do. Seeing Darleene for management and says is doing ok.  -Advised and counseled on a healthy lifestyle - including the importance of a healthy diet, regular physical activity, social connections and stress management. -Reviewed patient's current diet. Advised and counseled on a whole foods based healthy diet. A summary of a healthy diet was provided in the Patient Instructions.  -reviewed patient's current physical activity level and discussed exercise guidelines for adults. Discussed community resources and ideas for safe exercise at home to assist in meeting exercise guideline recommendations in a safe and healthy way.  -Advise yearly dental visits at minimum and regular eye exams   Follow up: see patient instructions     Patient Instructions  I really enjoyed getting to talk with you today! I am available on Tuesdays and Thursdays for virtual visits if you have any questions or concerns, or if I can be of any further assistance.   CHECKLIST FROM ANNUAL WELLNESS VISIT:  -Follow up (please call to schedule if not scheduled after visit):   -yearly for annual wellness visit with primary care office  Here is a list of your preventive  care/health maintenance measures and the plan for each if any are due:  PLAN For any measures below that may be due:    1. Please let us  know if you want us  to order colon cancer screening or the bone density test.   Health Maintenance  Topic Date Due   Colonoscopy  10/12/2023   Medicare Annual Wellness (AWV)  12/23/2023   COVID-19 Vaccine (1) 06/27/2024 (Originally 10/04/1955)    Diabetic kidney evaluation - Urine ACR  09/04/2028 (Originally 10/05/1968)   Mammogram  04/19/2025   Diabetic kidney evaluation - eGFR measurement  05/02/2025   DTaP/Tdap/Td (3 - Td or Tdap) 01/26/2026   Pneumococcal Vaccine: 50+ Years  Completed   Influenza Vaccine  Completed   Bone Density Scan  Completed   Hepatitis C Screening  Completed   Zoster Vaccines- Shingrix  Completed   Meningococcal B Vaccine  Aged Out    -See a dentist at least yearly  -Get your eyes checked and then per your eye specialist's recommendations  -Other issues addressed today:   -I have included below further information regarding a healthy whole foods based diet, physical activity guidelines for adults, stress management and opportunities for social connections. I hope you find this information useful.   -----------------------------------------------------------------------------------------------------------------------------------------------------------------------------------------------------------------------------------------------------------    NUTRITION: -eat real food: lots of colorful vegetables (half the plate) and fruits -5-7 servings of vegetables and fruits per day (fresh or steamed is best), exp. 2 servings of vegetables with lunch and dinner and 2 servings of fruit per day. Berries and greens such as kale and collards are great choices.  -consume on a regular basis:  fresh fruits, fresh veggies, fish, nuts, seeds, healthy oils (such as olive oil, avocado oil), whole grains (make sure for bread/pasta/crackers/etc., that the first ingredient on label contains the word whole), legumes. -can eat small amounts of dairy and lean meat (no larger than the palm of your hand), but avoid processed meats such as ham, bacon, lunch meat, etc. -drink water  -try to avoid fast food and pre-packaged foods, processed meat, ultra processed foods/beverages (donuts, candy, etc.) -most experts advise limiting  sodium to < 2300mg  per day, should limit further is any chronic conditions such as high blood pressure, heart disease, diabetes, etc. The American Heart Association advised that < 1500mg  is is ideal -try to avoid foods/beverages that contain any ingredients with names you do not recognize  -try to avoid foods/beverages  with added sugar or sweeteners/sweets  -try to avoid sweet drinks (including diet drinks): soda, juice, Gatorade, sweet tea, power drinks, diet drinks -try to avoid white rice, white bread, pasta (unless whole grain)  EXERCISE GUIDELINES FOR ADULTS: -if you wish to increase your physical activity, do so gradually and with the approval of your doctor -STOP and seek medical care immediately if you have any chest pain, chest discomfort or trouble breathing when starting or increasing exercise  -move and stretch your body, legs, feet and arms when sitting for long periods -Physical activity guidelines for optimal health in adults: -get at least 150 minutes per week of moderate exercise (can talk, but not sing); this is about 20-30 minutes of sustained activity 5-7 days per week or two 10-15 minute episodes of sustained activity 5-7 days per week -do some muscle building/resistance training/strength training at least 2 days per week  -balance exercises 3+ days per week:   Stand somewhere where you have something sturdy to hold onto if you lose balance    1) lift up on toes, then  back down, start with 5x per day and work up to 20x   2) stand and lift one leg straight out to the side so that foot is a few inches of the floor, start with 5x each side and work up to 20x each side   3) stand on one foot, start with 5 seconds each side and work up to 20 seconds on each side  If you need ideas or help with getting more active:  -Silver sneakers https://tools.silversneakers.com  -Walk with a Doc: Http://www.duncan-williams.com/  -try to include resistance (weight lifting/strength building) and  balance exercises twice per week: or the following link for ideas: http://castillo-powell.com/  buyducts.dk  STRESS MANAGEMENT: -can try meditating, or just sitting quietly with deep breathing while intentionally relaxing all parts of your body for 5 minutes daily -if you need further help with stress, anxiety or depression please follow up with your primary doctor or contact the wonderful folks at [facility]: [phone]  SOCIAL CONNECTIONS: -options in [place] if you wish to engage in more social and exercise related activities:  -Silver sneakers https://tools.silversneakers.com  -Walk with a Doc: Http://www.duncan-williams.com/  -Check out the [facility] Active Adults 50+ section on the [place] of [employer] companies (hiking clubs, book clubs, cards and games, chess, exercise classes, aquatic classes and much more) - see the website for details: https://www.[place].gov/departments/parks-recreation/active-adults50  -YouTube has lots of exercise videos for different ages and abilities as well  -[facility] (a variety of indoor and outdoor inperson activities for adults). [phone]. [address].  -Virtual Online Classes (a variety of topics): see seniorplanet.org or call [phone]  -consider volunteering at a school, hospice center, church, senior center or elsewhere            Chiquita JONELLE Cramp, DO

## 2024-06-17 ENCOUNTER — Encounter

## 2024-06-27 ENCOUNTER — Encounter

## 2024-06-29 ENCOUNTER — Ambulatory Visit: Attending: Student in an Organized Health Care Education/Training Program

## 2024-06-29 DIAGNOSIS — I639 Cerebral infarction, unspecified: Secondary | ICD-10-CM

## 2024-07-01 LAB — CUP PACEART REMOTE DEVICE CHECK
Date Time Interrogation Session: 20251212232709
Implantable Pulse Generator Implant Date: 20250327

## 2024-07-05 NOTE — Progress Notes (Signed)
 Remote Loop Recorder Transmission

## 2024-07-12 ENCOUNTER — Ambulatory Visit: Payer: Self-pay | Admitting: Student in an Organized Health Care Education/Training Program

## 2024-07-19 ENCOUNTER — Encounter

## 2024-07-25 ENCOUNTER — Ambulatory Visit: Payer: Self-pay | Admitting: *Deleted

## 2024-07-25 NOTE — Telephone Encounter (Signed)
 Noted

## 2024-07-25 NOTE — Telephone Encounter (Signed)
 FYI Only or Action Required?: Action required by provider: update on patient condition.  Patient was last seen in primary care on 06/11/2024 by Luke Chiquita SAUNDERS, DO.  Called Nurse Triage reporting off balance (Stumbling, off balance).  Symptoms began several months ago.  Interventions attempted: Rest, hydration, or home remedies.  Symptoms are: unchanged.  Triage Disposition: See PCP Within 2 Weeks  Patient/caregiver understands and will follow disposition?: No, refuses disposition  Copied from CRM #[id number]. Topic: Clinical - Red Word Triage >> Jul 25, 2024  8:52 AM Avram MATSU wrote: Red Word that prompted transfer to Nurse Triage:  sister stated pt had a stroke last spring and stated now pt isnt able to keep balance and seems to be regressing and showing signs of a stroke.  Patient's sister is concerned about patient balance and stumbling. Noticeable changes over several months. Patient also has depression that has gotten worse.  Reason for Disposition  Dizziness is a chronic symptom (recurrent or ongoing AND present > 4 weeks)  Answer Assessment - Initial Assessment Questions Patient's sister, Adrien, has concerns about patients balance- she has been with her where she lost balance in public and almost fell. Call to patient to discuss changes- patient does admit she feels off balance at times and has to hold onto people and carts to steady herself. Patient states she has discussed this with her provider and it is normal after stroke to have changes in balance. Patient states she has not noticed a big change in her problem with balance- it is harder away from home- but to be expected on uneven/unfamiliar surfaces. Patient does have a low heart rate- it stays in the 50's and has gotten as low as 47. She will discuss this with provider. Patient feels she is fine to wanit for her scheduled appointment in February- but will schedule earlier if PCP wants to see her. She has been advised to call  office if she has changes.    1. DESCRIPTION: Describe your dizziness.     Unsure how to describe 2. LIGHTHEADED: Do you feel lightheaded? (e.g., somewhat faint, woozy, weak upon standing)     Feels off balance- leaning forward 3. VERTIGO: Do you feel like either you or the room is spinning or tilting? (i.e., vertigo)     Patient does have vertigo diagnosis 4. SEVERITY: How bad is it?  Do you feel like you are going to faint? Can you stand and walk?     Patient is able to walk unassisted- she does have times where  5. ONSET:  When did the dizziness begin?     Patient doesn't feel her symptoms have changed  6. AGGRAVATING FACTORS: Does anything make it worse? (e.g., standing, change in head position)     walking 7. HEART RATE: Can you tell me your heart rate? How many beats in 15 seconds?  (Note: Not all patients can do this.)       Normal rate- in 50's - patient is keeping record 8. CAUSE: What do you think is causing the dizziness? (e.g., decreased fluids or food, diarrhea, emotional distress, heat exposure, new medicine, sudden standing, vomiting; unknown)     Hx stroke- complications with balance 9. RECURRENT SYMPTOM: Have you had dizziness before? If Yes, ask: When was the last time? What happened that time?     Yes- off/on- patient is very careful to hold on to keep balance when out in public 10. OTHER SYMPTOMS: Do you have any other symptoms? (e.g., fever,  chest pain, vomiting, diarrhea, bleeding)       no  Protocols used: Dizziness - Lightheadedness-A-AH

## 2024-07-28 ENCOUNTER — Encounter

## 2024-07-30 ENCOUNTER — Ambulatory Visit

## 2024-07-30 ENCOUNTER — Ambulatory Visit: Payer: Self-pay | Admitting: Student in an Organized Health Care Education/Training Program

## 2024-07-30 DIAGNOSIS — I639 Cerebral infarction, unspecified: Secondary | ICD-10-CM | POA: Diagnosis not present

## 2024-07-30 LAB — CUP PACEART REMOTE DEVICE CHECK
Date Time Interrogation Session: 20260112234413
Implantable Pulse Generator Implant Date: 20250327

## 2024-07-31 NOTE — Progress Notes (Signed)
 "   Cardiology Office Note   Date:  08/01/2024   ID:  Gabrielle Vargas, DOB May 15, 1951, MRN [id number]  PCP:  Merna Huxley, NP  Cardiologist:   Vina Gull, MD       History of Present Illness: Gabrielle Vargas is a 74 y.o. female with a history of HFrEF, HTN, HL, CVA, hypothyroidism,   March 2025  Pt was admitted for CVA   Received TNK   MRI showed multiple infarcts  Echo shoed LVEF 30 to 35%  Mild LVE  No thrombus    ? Takotsubo's CM   Started on GDMT  Pt underwent placement of LINQ PT placed on ASA and Plavix  x 3 wks then ASA.  June 2025 Carotid duplex was near normal June 2025  Echo LVEF normal     The pt says her breathing is good   SHe denies CP  No palpitations  The pt notes some spells while walking  She has had 3 spells in the last 3 wks   One occurred at [employer]  Had hands on cart  Then started leaning back   No dizziness.  NO syncope   Husband nearby The pt's husband notes that she will stumble som and walk fast forward      Active Medications[1]   Allergies:   Ativan  [lorazepam ], Oxycodone-acetaminophen , and Latex   Past Medical History:  Diagnosis Date   Acute combined systolic and diastolic heart failure 10/11/2023   ADHD (attention deficit hyperactivity disorder) 05/09/2007   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; multiple punctate foci of restricted diffusion in bilateral frontal lobes and the left parietal lobe   Diverticulitis of colon (without mention of hemorrhage) 09/17/2013   Essential hypertension 10/09/2017   HFrEF (heart failure with reduced ejection fraction) 10/10/2023   History of COVID-19 2021   Hyperlipidemia    Hypothyroidism    Low back pain radiating to right leg 10/09/2017   Mild cognitive impairment of uncertain or unknown etiology 08/04/2022   Mild intermittent asthma with acute exacerbation 05/30/2017   Mild neurocognitive disorder due to multiple etiologies 12/19/2023   NSVT (nonsustained  ventricular tachycardia) 10/12/2023   Osteoarthritis    Postmenopausal atrophic vaginitis, severe 08/18/2008   Right knee pain 03/13/2014   S/P arthroscopy of right knee 06/03/2014   Vitamin B12 deficiency     Past Surgical History:  Procedure Laterality Date   ABDOMINAL HYSTERECTOMY     BREAST SURGERY     CARPAL TUNNEL RELEASE Bilateral    CERVICAL DISC ARTHROPLASTY     CESAREAN SECTION     x2   left shoulder surgery     LOOP RECORDER INSERTION N/A 10/12/2023   Procedure: LOOP RECORDER INSERTION;  Surgeon: Cindie Ole DASEN, MD;  Location: [facility];  Service: Cardiovascular;  Laterality: N/A;   MENISCUS REPAIR Right 2017   REDUCTION MAMMAPLASTY Bilateral    right shoulder     TONSILLECTOMY       Family History:  The patient's family history includes ADD / ADHD in an other family member; Breast cancer in her paternal grandmother; Diabetes in her father; Heart disease in her father; Kidney disease in her father; Melanoma in her mother.    ROS:  Please see the history of present illness. All other systems are reviewed and  Negative to the above problem except as noted.    PHYSICAL EXAM: VS:  BP 110/64 (BP Location: Left Arm, Patient Position: Sitting, Cuff Size: Normal)  Pulse (!) 52   Ht 4' 11 (1.499 m)   Wt 112 lb (50.8 kg)   BMI 22.62 kg/m   GEN: Well nourished, well developed, in no acute distress  HEENT: normal  Neck: no JVD, carotid bruits Cardiac: RRR; no murmurs Respiratory:  clear to auscultation GI: soft, nontender,  No hepatomegaly  Ext  No LE edema    EKG:  EKG is not  ordered today.   Lipid Panel    Component Value Date/Time   CHOL 131 05/02/2024 1031   CHOL 126 11/13/2023 1607   TRIG 96.0 05/02/2024 1031   HDL 58.00 05/02/2024 1031   HDL 56 11/13/2023 1607   CHOLHDL 2 05/02/2024 1031   VLDL 19.2 05/02/2024 1031   LDLCALC 53 05/02/2024 1031   LDLCALC 46 11/13/2023 1607   LDLCALC 110 (H) 02/05/2020 0857   LDLDIRECT 42 11/13/2023  1608   LDLDIRECT 158.7 06/19/2014 0823      Wt Readings from Last 3 Encounters:  08/01/24 112 lb (50.8 kg)  05/24/24 109 lb (49.4 kg)  05/02/24 109 lb (49.4 kg)      ASSESSMENT AND PLAN:  1 Hx HFimp EF  LVEF has improved/normalized from initial echo in March 2025   ? Stress induced  at time of CVA    Clinically doing well   Volume status is good   2  Neuro   Hx CVA   Felt embolic   PT with LINQ in place   Follow  WIth spells I will see about getting her into neuro sooner than April  3  HL    Keep on Lipitor  In Oct 2025, LDL was 53  HDL 58  Trig 96  4 Metabolic   A1C 6 in Aug 2025  Limit carbs, sugars     Follow up this summer in clinic   Current medicines are reviewed at length with the patient today.  The patient does not have concerns regarding medicines.  Signed, Vina Gull, MD        [1]  Current Meds  Medication Sig   Acetylcysteine (NAC) 500 MG CAPS Take 500 mg by mouth daily.   Ascorbic Acid (VITAMIN C) 1000 MG tablet Take 1,000 mg by mouth daily.   aspirin  EC 81 MG tablet Take 81 mg by mouth daily. Swallow whole.   atorvastatin  (LIPITOR) 40 MG tablet Take 0.5 tablets (20 mg total) by mouth daily.   Bioflavonoid Products (QUERCETIN COMPLEX IMMUNE PO) Take 500 mg by mouth daily.   buPROPion  (WELLBUTRIN  XL) 300 MG 24 hr tablet Take 1 tablet (300 mg total) by mouth daily.   Cholecalciferol (D3-50 PO) Take 1 capsule by mouth daily.   Evening Primrose Oil 1000 MG CAPS Take 1,000 mg by mouth in the morning and at bedtime.   ezetimibe  (ZETIA ) 10 MG tablet Take 1 tablet (10 mg total) by mouth at bedtime.   ipratropium (ATROVENT ) 0.06 % nasal spray Place 2 sprays into both nostrils 2 (two) times daily as needed.   metoprolol  succinate (TOPROL  XL) 25 MG 24 hr tablet Take 0.5 tablets (12.5 mg total) by mouth daily.   SYNTHROID  75 MCG tablet Take 1 tablet (75 mcg total) by mouth daily.   vitamin B-12 (CYANOCOBALAMIN ) 1000 MCG tablet Take 1,000 mcg by mouth 2 (two)  times a week.   Zinc 20 MG CAPS Take 20 mg by mouth daily.   "

## 2024-08-01 ENCOUNTER — Ambulatory Visit: Admitting: Internal Medicine

## 2024-08-01 VITALS — BP 110/64 | HR 52 | Ht 59.0 in | Wt 112.0 lb

## 2024-08-01 DIAGNOSIS — I5020 Unspecified systolic (congestive) heart failure: Secondary | ICD-10-CM

## 2024-08-01 NOTE — Patient Instructions (Signed)
 Medication Instructions:  Your physician recommends that you continue on your current medications as directed. Please refer to the Current Medication list given to you today.  *If you need a refill on your cardiac medications before your next appointment, please call your pharmacy*  Lab Work: None. If you have labs (blood work) drawn today and your tests are completely normal, you will receive your results only by: MyChart Message (if you have MyChart) OR A paper copy in the mail If you have any lab test that is abnormal or we need to change your treatment, we will call you to review the results.  Testing/Procedures: None.   Follow-Up: At [facility], you and your health needs are our priority.  As part of our continuing mission to provide you with exceptional heart care, our providers are all part of one team.  This team includes your primary Cardiologist (physician) and Advanced Practice Providers or APPs (Physician Assistants and Nurse Practitioners) who all work together to provide you with the care you need, when you need it.  Your next appointment:   7-8 month(s)  Provider:   Vina Gull, MD    We recommend signing up for the patient portal called MyChart.  Sign up information is provided on this After Visit Summary.  MyChart is used to connect with patients for Virtual Visits (Telemedicine).  Patients are able to view lab/test results, encounter notes, upcoming appointments, etc.  Non-urgent messages can be sent to your provider as well.   To learn more about what you can do with MyChart, go to [email or web address].   Other Instructions Please contact your neurology office to see if you can get a sooner appointment.

## 2024-08-02 NOTE — Progress Notes (Signed)
 Remote Loop Recorder Transmission

## 2024-08-07 ENCOUNTER — Telehealth: Payer: Self-pay | Admitting: Internal Medicine

## 2024-08-07 NOTE — Telephone Encounter (Signed)
 Calling to say they are unable to get an appt with the neurologist for the patient. Please advise

## 2024-08-08 ENCOUNTER — Ambulatory Visit: Payer: Self-pay

## 2024-08-08 ENCOUNTER — Encounter: Payer: Self-pay | Admitting: Internal Medicine

## 2024-08-08 NOTE — Telephone Encounter (Signed)
 Next available neuro apt with Dr.Sethi is April. Spouse concerned patient has increasing unsteadiness recently and would like patient to be sooner. He is apparently on a waist list already.   Dr.Ross office note from 08/01/24:  ASSESSMENT AND PLAN:   1 Hx HFimp EF  LVEF has improved/normalized from initial echo in March 2025   ? Stress induced  at time of CVA    Clinically doing well   Volume status is good    2  Neuro   Hx CVA   Felt embolic   PT with LINQ in place   Follow  WIth spells I will see about getting her into neuro sooner than April   3  HL    Keep on Lipitor  In Oct 2025, LDL was 53  HDL 58  Trig 96   4 Metabolic   A1C 6 in Aug 2025  Limit carbs, sugars      Follow up this summer in clinic     Current medicines are reviewed at length with the patient today.  The patient does not have concerns regarding medicines.   Signed, Vina Gull, MD          I will forward to Dr.Sethi for review.

## 2024-08-08 NOTE — Telephone Encounter (Signed)
 FYI Only or Action Required?: FYI only for provider: appointment scheduled on 08/09/24.  Patient was last seen in primary care on 06/11/2024 by Luke Chiquita SAUNDERS, DO.  Called Nurse Triage reporting Dizziness.  Symptoms began several weeks ago.  Interventions attempted: Nothing.  Symptoms are: gradually worsening.  Triage Disposition: See PCP When Office is Open (Within 3 Days)  Patient/caregiver understands and will follow disposition?: Yes     Message from [facility] sent at 08/08/2024 11:22 AM EST  Reason for Triage: Had stroke in March been to rehab, last few days has had episodes of being off balance and dizziness seems like a regression for patient   Reason for Disposition  [1] MILD dizziness (e.g., walking normally) AND [2] has NOT been evaluated by doctor (or NP/PA) for this  (Exception: Dizziness caused by heat exposure, sudden standing, or poor fluid intake.)  Answer Assessment - Initial Assessment Questions Pt's husband, [name], contacted clinic to report worsening dizziness and balance issues over the past couple weeks that has worsened over the past 2-3 days. He states that pt doesn't have great recall so he is unable to provide specific symptoms. He states that pt seems to be off balance at times and unsure if she is fearful of falling d/t her taking staggered steps at times. He states they were at the grocery store looking at coffee pots and all of the sudden pt fell backward into display case but he was able to catch her so she did not go down or injure herself. He states he asked her if she was dizzy or what happened but pt did not recall and was unaware of situation. He states pt does not drive but likes to run errands with him so when they go out, he has her hold on to his belt loop or hand then he wraps his arm around her to keep her braced. Pt does not use assistive device. He denies any other symptoms, no new medications, no numbness or weakness, no signs of stroke. He  states that pt is also sleeping a lot more, approx 12h at night and is not the best at drinking fluids. She does drink coffee daily. Discussed appt with PCP for eval and possibly new imaging as nothing recent. He is agreeable, Appointment scheduled for evaluation, and will call back if anything changes, or if symptoms worsen.  Protocols used: Dizziness - Lightheadedness-A-AH

## 2024-08-09 ENCOUNTER — Ambulatory Visit: Admitting: Adult Health

## 2024-08-09 ENCOUNTER — Encounter: Payer: Self-pay | Admitting: Adult Health

## 2024-08-09 VITALS — BP 130/86 | HR 71 | Temp 97.7°F | Ht 59.0 in | Wt 114.0 lb

## 2024-08-09 DIAGNOSIS — I639 Cerebral infarction, unspecified: Secondary | ICD-10-CM | POA: Diagnosis not present

## 2024-08-09 DIAGNOSIS — R2681 Unsteadiness on feet: Secondary | ICD-10-CM

## 2024-08-09 NOTE — Progress Notes (Signed)
 "  Subjective:    Patient ID: Gabrielle Vargas, female    DOB: 1951-06-17, 74 y.o.   MRN: [id number]  Dizziness    Discussed the use of AI scribe software for clinical note transcription with the patient, who gave verbal consent to proceed.  History of Present Illness   Gabrielle Vargas is a 74 year old female with a history of stroke who presents with balance issues and frequent falls. She is accompanied by [name], her husband.  Over the past 10 months she has had progressive balance problems and falls, with a recent increase to five episodes in the last two weeks, including four on consecutive days this week. During events she feels pulled backward from the waist without dizziness or lightheadedness, often nearly falling and needing assistance. After episodes she has difficulty walking with stumbling and dragging her feet. She was seen by her Cardiologist about a week ago and this issue was brought up then. Cardiology is trying to get her into see her Neurologist sooner than the scheduled appointment in April 2026.   In December she had a severe episode of dizziness, nausea, and left-sided headache, and on another occasion she needed about 40 minutes to type a brief text message.       Review of Systems  Neurological:  Positive for dizziness.   See HPI   Past Medical History:  Diagnosis Date   Acute combined systolic and diastolic heart failure 10/11/2023   ADHD (attention deficit hyperactivity disorder) 05/09/2007   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; multiple punctate foci of restricted diffusion in bilateral frontal lobes and the left parietal lobe   Diverticulitis of colon (without mention of hemorrhage) 09/17/2013   Essential hypertension 10/09/2017   HFrEF (heart failure with reduced ejection fraction) 10/10/2023   History of COVID-19 2021   Hyperlipidemia    Hypothyroidism    Low back pain radiating to right leg 10/09/2017   Mild  cognitive impairment of uncertain or unknown etiology 08/04/2022   Mild intermittent asthma with acute exacerbation 05/30/2017   Mild neurocognitive disorder due to multiple etiologies 12/19/2023   NSVT (nonsustained ventricular tachycardia) 10/12/2023   Osteoarthritis    Postmenopausal atrophic vaginitis, severe 08/18/2008   Right knee pain 03/13/2014   S/P arthroscopy of right knee 06/03/2014   Vitamin B12 deficiency     Social History   Socioeconomic History   Marital status: Married    Spouse name: Not on file   Number of children: 2   Years of education: 13   Highest education level: Some college, no degree  Occupational History   Occupation: Retired  Tobacco Use   Smoking status: Never   Smokeless tobacco: Never  Vaping Use   Vaping status: Never Used  Substance and Sexual Activity   Alcohol use: No   Drug use: No   Sexual activity: Not on file  Other Topics Concern   Not on file  Social History Narrative   Retired   Married   2 children   Right handed   Live with husband   Social Drivers of Health   Tobacco Use: Low Risk (08/09/2024)   Patient History    Smoking Tobacco Use: Never    Smokeless Tobacco Use: Never    Passive Exposure: Not on file  Financial Resource Strain: Low Risk (12/23/2022)   Overall Financial Resource Strain (CARDIA)    Difficulty of Paying Living Expenses: Not hard at all  Food Insecurity: Patient Unable To  Answer (06/11/2024)   Epic    Worried About Programme Researcher, Broadcasting/film/video in the Last Year: Patient unable to answer    Ran Out of Food in the Last Year: Patient unable to answer  Transportation Needs: Patient Unable To Answer (06/11/2024)   Epic    Lack of Transportation (Medical): Patient unable to answer    Lack of Transportation (Non-Medical): Patient unable to answer  Physical Activity: Sufficiently Active (06/11/2024)   Exercise Vital Sign    Days of Exercise per Week: 7 days    Minutes of Exercise per Session: 40 min  Stress: No  Stress Concern Present (06/11/2024)   [employer] of Occupational Health - Occupational Stress Questionnaire    Feeling of Stress: Not at all  Social Connections: Moderately Integrated (06/11/2024)   Social Connection and Isolation Panel    Frequency of Communication with Friends and Family: More than three times a week    Frequency of Social Gatherings with Friends and Family: Twice a week    Attends Religious Services: More than 4 times per year    Active Member of Clubs or Organizations: No    Attends Banker Meetings: Patient unable to answer    Marital Status: Married  Catering Manager Violence: Patient Unable To Answer (10/09/2023)   Humiliation, Afraid, Rape, and Kick questionnaire    Fear of Current or Ex-Partner: Patient unable to answer    Emotionally Abused: Patient unable to answer    Physically Abused: Patient unable to answer    Sexually Abused: Patient unable to answer  Depression (PHQ2-9): High Risk (06/11/2024)   Depression (PHQ2-9)    PHQ-2 Score: 14  Alcohol Screen: Low Risk (12/23/2022)   Alcohol Screen    Last Alcohol Screening Score (AUDIT): 0  Housing: Patient Unable To Answer (10/09/2023)   Housing Stability Vital Sign    Unable to Pay for Housing in the Last Year: Patient unable to answer    Number of Times Moved in the Last Year: Not on file    Homeless in the Last Year: Patient unable to answer  Utilities: Patient Unable To Answer (06/11/2024)   Epic    Threatened with loss of utilities: Patient unable to answer  Health Literacy: Inadequate Health Literacy (06/11/2024)   B1300 Health Literacy    Frequency of need for help with medical instructions: Sometimes    Past Surgical History:  Procedure Laterality Date   ABDOMINAL HYSTERECTOMY     BREAST SURGERY     CARPAL TUNNEL RELEASE Bilateral    CERVICAL DISC ARTHROPLASTY     CESAREAN SECTION     x2   left shoulder surgery     LOOP RECORDER INSERTION N/A 10/12/2023   Procedure: LOOP  RECORDER INSERTION;  Surgeon: Cindie Ole DASEN, MD;  Location: [facility];  Service: Cardiovascular;  Laterality: N/A;   MENISCUS REPAIR Right 2017   REDUCTION MAMMAPLASTY Bilateral    right shoulder     TONSILLECTOMY      Family History  Problem Relation Age of Onset   Melanoma Mother    Kidney disease Father    Diabetes Father    Heart disease Father    Breast cancer Paternal Grandmother    ADD / ADHD Other        multiple family members   Colon cancer Neg Hx    Esophageal cancer Neg Hx    Rectal cancer Neg Hx    Stomach cancer Neg Hx     Allergies[1]  Medications Ordered Prior to Encounter[2]  BP 130/86   Pulse 71   Temp 97.7 F (36.5 C) (Oral)   Ht 4' 11 (1.499 m)   Wt 114 lb (51.7 kg)   SpO2 94%   BMI 23.03 kg/m       Objective:   Physical Exam Vitals and nursing note reviewed.  Constitutional:      Appearance: Normal appearance.  Eyes:     General: No visual field deficit. Cardiovascular:     Rate and Rhythm: Normal rate and regular rhythm.     Pulses: Normal pulses.     Heart sounds: Normal heart sounds.  Pulmonary:     Effort: Pulmonary effort is normal.     Breath sounds: Normal breath sounds.  Skin:    General: Skin is warm and dry.     Capillary Refill: Capillary refill takes less than 2 seconds.  Neurological:     General: No focal deficit present.     Mental Status: She is alert and oriented to person, place, and time.     Cranial Nerves: Dysarthria present. No facial asymmetry.     Sensory: Sensation is intact.     Motor: Motor function is intact.     Coordination: Romberg sign negative. Finger-Nose-Finger Test normal.     Gait: Gait abnormal (needs to hold on to husbands arm to walk).  Psychiatric:        Mood and Affect: Mood normal.        Behavior: Behavior normal.        Thought Content: Thought content normal.        Judgment: Judgment normal.       Assessment & Plan:  Assessment and Plan    Gait instability and  falls, evaluation in context of prior stroke - No neuro deficit noted past baseline of minor word finding issues.  - Hopefully can be seen by Neurology sooner.      Darleene Shape, NP  I personally spent a total of 42 minutes in the care of the patient today including preparing to see the patient, getting/reviewing separately obtained history, performing a medically appropriate exam/evaluation, counseling and educating, placing orders, and documenting clinical information in the EHR.      [1]  Allergies Allergen Reactions   Ativan  [Lorazepam ]     Agitation/paradoxical reaction.   Oxycodone-Acetaminophen  Nausea Only   Latex Hives and Rash  [2]  Current Outpatient Medications on File Prior to Visit  Medication Sig Dispense Refill   Acetylcysteine (NAC) 500 MG CAPS Take 500 mg by mouth daily.     Ascorbic Acid (VITAMIN C) 1000 MG tablet Take 1,000 mg by mouth daily.     aspirin  EC 81 MG tablet Take 81 mg by mouth daily. Swallow whole.     atorvastatin  (LIPITOR) 40 MG tablet Take 0.5 tablets (20 mg total) by mouth daily. 90 tablet 3   Bioflavonoid Products (QUERCETIN COMPLEX IMMUNE PO) Take 500 mg by mouth daily.     buPROPion  (WELLBUTRIN  XL) 300 MG 24 hr tablet Take 1 tablet (300 mg total) by mouth daily. 90 tablet 1   Cholecalciferol (D3-50 PO) Take 1 capsule by mouth daily.     Evening Primrose Oil 1000 MG CAPS Take 1,000 mg by mouth in the morning and at bedtime.     ezetimibe  (ZETIA ) 10 MG tablet Take 1 tablet (10 mg total) by mouth at bedtime. 90 tablet 3   ipratropium (ATROVENT ) 0.06 % nasal spray Place 2 sprays into both  nostrils 2 (two) times daily as needed. 15 mL 12   metoprolol  succinate (TOPROL  XL) 25 MG 24 hr tablet Take 0.5 tablets (12.5 mg total) by mouth daily. 45 tablet 3   SYNTHROID  75 MCG tablet Take 1 tablet (75 mcg total) by mouth daily. 90 tablet 3   vitamin B-12 (CYANOCOBALAMIN ) 1000 MCG tablet Take 1,000 mcg by mouth 2 (two) times a week.     Zinc 20 MG CAPS  Take 20 mg by mouth daily.     No current facility-administered medications on file prior to visit.   "

## 2024-08-16 NOTE — Telephone Encounter (Signed)
 Husband [name]) returned RN's call and stated he left a message for RN Geni regarding his multiple calls to Dr. Rosemarie and next steps.

## 2024-08-16 NOTE — Telephone Encounter (Signed)
 Left message for Gabrielle Vargas informing him of response from Dr. Rosemarie. Provided number for [facility] for him to call. Also provided our office callback number if any further questions.

## 2024-08-16 NOTE — Telephone Encounter (Signed)
 Shared message from Dr. Rosemarie with patient's spouse, [name].  [name] reports patient continues to have episodes of being off balance, bending backwards at waist and unable to control movement. She is having to hold onto objects such as carts at the grocery store when out to keep from falling. She had this occur 4 days in a row recently.   Advised him to call [facility] and request sooner appt (next available) with any provider at their practice. [name] verbalized understanding and expressed appreciation for call.

## 2024-08-20 ENCOUNTER — Telehealth: Payer: Self-pay | Admitting: Neurology

## 2024-08-20 NOTE — Telephone Encounter (Signed)
 Pt spouse(on DPR) states pt's Cardiologist's office(Dr Okey) informed him that they received a message from Dr Rosemarie that maybe pt could see another provider here within [facility] about the balance issues, phone rep informed spouse a message would have to be sent to [facility] for Dr Rosemarie about this as there is no note that phone rep could see indicating Dr Rosemarie stating pt could see another provider here.

## 2024-08-21 NOTE — Progress Notes (Incomplete)
 "    Mild Cognitive Impairment of unclear etiology concern for vascular and Alzheimer's disease   Gabrielle Vargas is a very pleasant 74 y.o. 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 Patient was last seen 08/23/23.  Since her last visit, the patient sustained likely embolic acute ischemic infarct (see below).  Imaging also shows  possible hippocampal atrophy, less concerning for AD. Memory decline is reported by her husband despite good performance on MMSE 29/30.  Discussed initiating antidementia medication.  Unfortunately, her pulse runs in the 50s, therefore due to bradycardia, at this moment we are unable to initiate ACHI.  We discussed starting memantine  5 mg twice daily as directed, patient and husband agreed to proceed.  Patient is able to participate on ADLs, does not drive in view of recent stroke. Mood is anxious but improves since her last visit.  This patient is accompanied in the office by her husband who supplements the history. Previous records as well as any outside records available were reviewed prior to todays visit   Follow up August 25 at 11:30 repeat neuropsych evaluation for diagnostic clarity Recommend psychotherapy for situational stress Continue Adderall for ADHD Recommend good control of cardiovascular risk factors, continue baby aspirin  daily follow-up with cardiology.  Start Memantine  5 mg: Take 1 tablet (5 mg at night) for 2 weeks, then increase to 1 tablet (5 mg) twice a day.  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 Presented with anemia time window for thrombolysis CT of the head showed no acute abnormalities but neuroexam suggested left facial droop, speech difficulties, left arm and leg weakness, left-sided visual field cut, NIH stroke scale 8 Received IV TNK CT angio without LVO, but 1 mm aneurysm or continue alone at the right P-comm origin MRI of the brain showed multiple punctate tiny acute infarcts involving B frontal and left parietal lobes 2D echo EF 30 to 35% grade 1 diastolic dysfunction LDL 80 A1c 6.2 Cardiology consulted, she had a loop recorder inserted, A-fib was not found She was discharged on dual antiplatelet therapy for 3 weeks and then aspirin  alone Follow-up with [facility], Dr. Rosemarie as scheduled     Discussed the use of AI scribe software for clinical note transcription with the patient, who gave verbal consent to proceed.  History of Present Illness     She describes mild worsening of short-term memory over the past year, including increased difficulty recalling recent conversations, word retrieval, and occasionally forgetting intended speech mid-sentence. Long-term memory remains intact. She sometimes repeats questions.  She does not report disorientation at home, misplacing items in unusual places, or hoarding behaviors. She manages her medications with her husband and has always relied on him for financial management. She remains independent with showering and grooming. Appetite and hydration  are good, and there is no difficulty with swallowing. She and her husband cook together, and he assists with  cleanup.  She reports no hallucinations, paranoia, or perceptual disturbances while awake. She describes occasional dreams of being in a vacation home, which are not recurring. She feels emotionally better recently due to improved family support and resolution of stress related to her daughter. She experiences seasonal low mood during colder months, which improves in spring.  She enjoys some brain stimulating exercises.  She experiences ocular migraines with aura lasting 15-30 minutes, most recently occurring last night, managed with Tylenol  and resolving spontaneously. She does not drive anymore due to her recent stroke.   She has a history of ischemic stroke, experiencing  decreased visual field on the left side and facial droop, both of which have resolved.  She denies any unilateral weakness or numbness.  She denies any recent falls, head injuries, new focal neurological deficits, facial droop, or visual field loss.  She is able to ambulate within the home without difficulty, no longer walks long distances due to cold weather.  Of note, 2 weeks ago she had a sudden sensation of moving backwards while standing in line and holding onto a cart, with 4 similar episodes over the following week.  Her husband also reports that sometimes she walks very fast and small steps but she does not know why    She has a history of ocular migraines with aura, urge incontinence, and ADHD contributing to attention difficulties.  Urge incontinence persists, characterized by a strong need to urinate and concern about timely access to a restroom. Bowel habits are regular without diarrhea or constipation.   Initial evaluation 06/19/2022   How long did patient have memory difficulties?  For the last 2 years, the patient experienced some episodes of disorientation, initially attributed to COVID, as she had it 3 times, the symptoms appear to have been worse during those times.  On 05/19/2022, 2 weeks after COVID, her  husband took her to the grocery store, and she stood at the sandwich counter trying to decide what she should order, despite 15 or 20 minutes have been passed by.  She also reported that she could not completely breathe, since she had any blurred vision.  Her husband then sent her to another area of the store to shop for serial, and again, she could not make a decision, she cannot find what she came to look for.  In another event, at home, she was unable to remember where she kept items in the kitchen.  On Nov 11,  she was having a yard sale at her home, holding a plate and asked her daughter what should I do with this  There were no episodes of slurred speech or facial droop or unilateral weakness with these episodes, she was just having word finding difficulty.  The patient reported at the time being disoriented, having a mild headache, and dizzy with blurry vision, for which she was sent to the ED.  CT scan of the head then was negative for acute abnormalities.  UA was negative.  On November 21, she was texting with her sister, but eventually she decided to talk with her on the phone, and felt that it was not making any sense.  The patient was aware of what was happening.  That last.  Lasted for about 1 hour.  In the interim, she has difficulty thinking of the word, or naming a kitchen tool, names of people and to a  certain extent conversations that took place 2 to 3 days prior.   repeats oneself?  Endorsed her husband states that when he turned day prior load with, she asked what is that smell , 3 times (that was in November 21)  Ambulates  with difficulty?  Until recently, she and her husband walk 4-6 miles till this happened, he also enjoyed playing golf and fishing  Recent falls?  Patient denies   Any head injuries?  Patient denies   History of seizures?   Patient denies   Wandering behavior?  Patient denies   Patient drives?   Husband does not let her since the 11th  Any personality changes?   Patient recognizes that for the last 2 months, she has made little comments to her husband, which are not unusual for her  Any history of depression?:  Patient denies   Hallucinations or paranoia?  Denies hallucinations, but lately she has been felt on the edge as if something is going to happen Patient reports that sleeps well with occasional vivid dreams, but without REM behavior or sleepwalking    History of sleep apnea?  Patient denies   Any hygiene concerns?  Patient denies   Independent of bathing and dressing?  Endorsed  Does the patient needs help with medications? Patient is in charge  Who is in charge of the finances?  Husband is in charge  (always)  Any changes in appetite?  s A little less, I don't eat as much as I used to    Patient have trouble swallowing? Patient denies   Does the patient cook? Any kitchen accidents such as leaving the stove on? We love to cook at home  Patient denies nay kitchen accidents.  Any headaches?  Patient denies I have a history of ocular migraines, p 15 -30 mins gets an aura , but not actually diagnosed.  Her husband reports that she has just looked at her symptoms on the Internet and self diagnosed Double vision? Patient denies  I have been sent to ophthalmologist, but records are not available for review  any focal numbness or tingling?  Patient denies   Chronic back pain Patient denies   Unilateral weakness?  Patient denies   Any tremors?  Patient denies   Any history of anosmia?  Patient denies   Any incontinence of urine?  Urge incontinence  Any bowel dysfunction?   Patient denies    History of heavy alcohol intake?  Patient denies   History of heavy tobacco use?  Patient denies   Family history of dementia?    GGM Alzheimer's disease   Patient lives:  Husband and daughter   Pertinent Labs B12 455, D34.12, CBC normal, A1c 6.4, total cholesterol 201, triglycerides 164, HDL normal LDL 107.   Neuropsychological evaluation 08/11/2022  Briefly, results suggested impairment across several aspects of expressive language, namely verbal fluency (semantic worse than phonemic) and confrontation naming. Performance variability was exhibited across executive functioning, while an additional weakness was noted across inattention and vigilance aspects of a continuous performance task. The etiology for ongoing dysfunction is unclear at the present time. Neurologically speaking, recent neuroimaging revealed minimal microvascular ischemic changes, not suggestive of any prior infarcts or reason to suspect an underlying vascular etiology. A recent 1-hour EEG did not reveal epileptiform discharges during active monitoring. Performances across memory testing were not suggestive of Alzheimer's disease. Behavioral characteristics were not suggestive of Lewy body disease, another more rare parkinsonian condition, or frontotemporal lobar degeneration. Typical conversation was also  not strongly suggestive of a non-fluent primary progressive aphasia or semantic dementia presentation. While expressive language dysfunction could raise concern for a logopenic aphasia presentation, overall cognitive patterns were not wholly consistent with this illness presently. Ms. Nessler does have a longstanding history of distractibility and trouble with sustained focus. Performance across a continuous performance task did align with her previous diagnosis of ADHD. There remains the potential for attentional lapses to be a direct cause for word finding difficulties and instances where she may lose her train of thought or become disoriented in her day-to-day life. While a contribution from her history of several COVID-19 infections is plausible, there has yet to be a sufficient number of high quality longitudinal studies to truly understand the likelihood and extent of persisting cognitive dysfunction stemming from prior COVID-19 infection. Stating a direct link between expressive  language dysfunction and COVID-19 experience with any degree of certainly would appear premature at the present time. Continued medical monitoring will be important moving forward.    MRI of the brain 10/09/2023 personally reviewed, with multiple punctate acute infarcts in bilateral frontal and left parietal lobes, likely embolic, cerebral and hippocampal atrophy noted  Past Medical History:  Diagnosis Date   Acute combined systolic and diastolic heart failure 10/11/2023   ADHD (attention deficit hyperactivity disorder) 05/09/2007   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; multiple punctate foci of restricted diffusion in bilateral frontal lobes and the left parietal lobe   Diverticulitis of colon (without mention of hemorrhage) 09/17/2013   Essential hypertension 10/09/2017   HFrEF (heart failure with reduced ejection fraction) 10/10/2023   History of COVID-19 2021   Hyperlipidemia    Hypothyroidism    Low back pain radiating to right leg 10/09/2017   Mild cognitive impairment of uncertain or unknown etiology 08/04/2022   Mild intermittent asthma with acute exacerbation 05/30/2017   Mild neurocognitive disorder due to multiple etiologies 12/19/2023   NSVT (nonsustained ventricular tachycardia) 10/12/2023   Osteoarthritis    Postmenopausal atrophic vaginitis, severe 08/18/2008   Right knee pain 03/13/2014   S/P arthroscopy of right knee 06/03/2014   Vitamin B12 deficiency      Past Surgical History:  Procedure Laterality Date   ABDOMINAL HYSTERECTOMY     BREAST SURGERY     CARPAL TUNNEL RELEASE Bilateral    CERVICAL DISC ARTHROPLASTY     CESAREAN SECTION     x2   left shoulder surgery     LOOP RECORDER INSERTION N/A 10/12/2023   Procedure: LOOP RECORDER INSERTION;  Surgeon: Cindie Ole DASEN, MD;  Location: [facility];  Service: Cardiovascular;  Laterality: N/A;   MENISCUS REPAIR Right 2017   REDUCTION MAMMAPLASTY Bilateral    right shoulder      TONSILLECTOMY           Objective:     PHYSICAL EXAMINATION:    VITALS:   Vitals:   08/22/24 0918 08/22/24 1044  BP: (!) 155/69 138/79  Pulse: 60   Resp: 20   SpO2: 95%   Weight: 112 lb (50.8 kg)     GEN:  The patient appears stated age and is in NAD. HEENT:  Normocephalic, atraumatic.   Neurological examination:  General: NAD, well-groomed, appears stated age. Orientation: The patient is alert. Oriented to person, place and to date. Cranial nerves: There is good facial symmetry.The speech is somewhat less fluent than prior and clear. No aphasia or dysarthria. Fund of knowledge is appropriate. Recent memory impaired and remote memory is  normal.  Attention and concentration are normal.  Able to name objects and repeat phrases.  Hearing is intact to conversational tone.   Delayed recall 2/3 Sensation: Sensation is intact to light touch throughout Motor: Strength is at least antigravity x4. DTR's 2/4 in UE/LE      06/20/2022   12:00 PM  Montreal Cognitive Assessment   Visuospatial/ Executive (0/5) 5  Naming (0/3) 3  Attention: Read list of digits (0/2) 2  Attention: Read list of letters (0/1) 1  Attention: Serial 7 subtraction starting at 100 (0/3) 1  Language: Repeat phrase (0/2) 2  Language : Fluency (0/1) 0  Abstraction (0/2) 0  Delayed Recall (0/5) 3  Orientation (0/6) 6  Total 23  Adjusted Score (based on education) 23       08/22/2024   10:00 AM 08/23/2023   12:00 PM 06/02/2022   11:04 AM  MMSE - Mini Mental State Exam  Orientation to time 5 5 5   Orientation to Place 5 5 5   Registration 3 3 3   Attention/ Calculation 5 5 5   Recall 2 3 3   Language- name 2 objects 2 2 2   Language- repeat 1 1 1   Language- follow 3 step command 3 3 3   Language- read & follow direction 1 1 1   Write a sentence 1 1 1   Copy design 1 1 1   Total score 29 30 30       Movement examination: Tone: There is normal tone in the UE/LE Abnormal movements:  no tremor.  No myoclonus.   No asterixis.   Coordination:  There is no decremation with RAM's. Normal finger to nose  Gait and Station: The patient has no difficulty arising out of a deep-seated chair without the use of the hands. The patient's stride length is good.  Gait is cautious and narrow.   Thank you for allowing us  the opportunity to participate in the care of this nice patient. Please do not hesitate to contact us  for any questions or concerns.   Total time spent on today's visit was 46 minutes dedicated to this patient today, preparing to see patient, examining the patient, ordering tests and/or medications and counseling the patient, documenting clinical information in the EHR or other health record, independently interpreting results and communicating results to the patient/family, discussing treatment and goals, answering patient's questions and coordinating care.  Cc:  Merna Huxley, NP  Camie Sevin 08/22/2024 10:53 AM      "

## 2024-08-22 ENCOUNTER — Ambulatory Visit: Payer: PPO | Admitting: Physician Assistant

## 2024-08-22 ENCOUNTER — Telehealth: Payer: Self-pay | Admitting: Neurology

## 2024-08-22 ENCOUNTER — Encounter: Payer: Self-pay | Admitting: Physician Assistant

## 2024-08-22 VITALS — BP 138/79 | HR 60 | Resp 20 | Wt 112.0 lb

## 2024-08-22 DIAGNOSIS — G3184 Mild cognitive impairment, so stated: Secondary | ICD-10-CM

## 2024-08-22 DIAGNOSIS — I639 Cerebral infarction, unspecified: Secondary | ICD-10-CM

## 2024-08-22 MED ORDER — MEMANTINE HCL 5 MG PO TABS: ORAL_TABLET | ORAL | 11 refills | Status: AC

## 2024-08-22 NOTE — Telephone Encounter (Signed)
 error

## 2024-08-22 NOTE — Patient Instructions (Addendum)
 It was a pleasure to see you today at our office.   Recommendations:  Follow up Aug 25 at 11:30   Start Memantine  5 mg: Take 1 tablet (5 mg at night) for 2 weeks, then increase to 1 tablet (5 mg) twice a day  Control that blood pressure  Monitor ADHD Recommend psychotherapy Repeat neuropsych evaluation  Follow up with Dr. Rosemarie for stroke follow up  Whom to call:  Memory  decline, memory medications: Call our office [phone]   For psychiatric meds, mood meds: Please have your primary care physician manage these medications.        RECOMMENDATIONS FOR ALL PATIENTS WITH MEMORY PROBLEMS: 1. Continue to exercise (Recommend 30 minutes of walking everyday, or 3 hours every week) 2. Increase social interactions - continue going to [place] and enjoy social gatherings with friends and family 3. Eat healthy, avoid fried foods and eat more fruits and vegetables 4. Maintain adequate blood pressure, blood sugar, and blood cholesterol level. Reducing the risk of stroke and cardiovascular disease also helps promoting better memory. 5. Avoid stressful situations. Live a simple life and avoid aggravations. Organize your time and prepare for the next day in anticipation. 6. Sleep well, avoid any interruptions of sleep and avoid any distractions in the bedroom that may interfere with adequate sleep quality 7. Avoid sugar, avoid sweets as there is a strong link between excessive sugar intake, diabetes, and cognitive impairment We discussed the Mediterranean diet, which has been shown to help patients reduce the risk of progressive memory disorders and reduces cardiovascular risk. This includes eating fish, eat fruits and green leafy vegetables, nuts like almonds and hazelnuts, walnuts, and also use olive oil. Avoid fast foods and fried foods as much as possible. Avoid sweets and sugar as sugar use has been linked to worsening of memory function.  There is always a concern of gradual progression of  memory problems. If this is the case, then we may need to adjust level of care according to patient needs. Support, both to the patient and caregiver, should then be put into place.    FALL PRECAUTIONS: Be cautious when walking. Scan the area for obstacles that may increase the risk of trips and falls. When getting up in the mornings, sit up at the edge of the bed for a few minutes before getting out of bed. Consider elevating the bed at the head end to avoid drop of blood pressure when getting up. Walk always in a well-lit room (use night lights in the walls). Avoid area rugs or power cords from appliances in the middle of the walkways. Use a walker or a cane if necessary and consider physical therapy for balance exercise. Get your eyesight checked regularly.  FINANCIAL OVERSIGHT: Supervision, especially oversight when making financial decisions or transactions is also recommended.  HOME SAFETY: Consider the safety of the kitchen when operating appliances like stoves, microwave oven, and blender. Consider having supervision and share cooking responsibilities until no longer able to participate in those. Accidents with firearms and other hazards in the house should be identified and addressed as well.   ABILITY TO BE LEFT ALONE: If patient is unable to contact 911 operator, consider using LifeLine, or when the need is there, arrange for someone to stay with patients. Smoking is a fire hazard, consider supervision or cessation. Risk of wandering should be assessed by caregiver and if detected at any point, supervision and safe proof recommendations should be instituted.  MEDICATION SUPERVISION: Inability to self-administer medication  needs to be constantly addressed. Implement a mechanism to ensure safe administration of the medications.

## 2024-08-22 NOTE — Telephone Encounter (Signed)
 Pt husband came in to the office. States they just left office of Camie Sevin, [place] where she suggested pt gets follow up with Dr. Rosemarie sooner than April. Let pt husband know we did not have anything sooner with Dr. Rosemarie but I could schedule with Dr. Gregg. He states Camie Sevin, [place] was very specific about pt seeing Dr. Rosemarie and they wished to stay with him. Let him know we would place pt on the waitlist and I would put in a note of the discussion we had.

## 2024-08-28 ENCOUNTER — Encounter

## 2024-08-30 ENCOUNTER — Ambulatory Visit

## 2024-09-05 ENCOUNTER — Encounter: Admitting: Adult Health

## 2024-11-13 ENCOUNTER — Ambulatory Visit: Admitting: Neurology

## 2025-01-09 ENCOUNTER — Institutional Professional Consult (permissible substitution): Admitting: Psychology

## 2025-01-09 ENCOUNTER — Ambulatory Visit: Payer: Self-pay

## 2025-01-16 ENCOUNTER — Encounter: Admitting: Psychology

## 2025-03-11 ENCOUNTER — Ambulatory Visit: Payer: Self-pay | Admitting: Physician Assistant
